# Patient Record
Sex: Female | Born: 1940 | Race: White | Hispanic: No | Smoking: Former smoker
Health system: Southern US, Community
[De-identification: ages and names within clinical notes are randomized; demographics above are authoritative.]

## PROBLEM LIST (undated history)

## (undated) DIAGNOSIS — F419 Anxiety disorder, unspecified: Secondary | ICD-10-CM

## (undated) DIAGNOSIS — C801 Malignant (primary) neoplasm, unspecified: Secondary | ICD-10-CM

## (undated) DIAGNOSIS — F329 Major depressive disorder, single episode, unspecified: Secondary | ICD-10-CM

## (undated) DIAGNOSIS — G4730 Sleep apnea, unspecified: Secondary | ICD-10-CM

## (undated) DIAGNOSIS — K5730 Diverticulosis of large intestine without perforation or abscess without bleeding: Secondary | ICD-10-CM

## (undated) DIAGNOSIS — K5790 Diverticulosis of intestine, part unspecified, without perforation or abscess without bleeding: Secondary | ICD-10-CM

## (undated) DIAGNOSIS — J45909 Unspecified asthma, uncomplicated: Secondary | ICD-10-CM

## (undated) DIAGNOSIS — J449 Chronic obstructive pulmonary disease, unspecified: Secondary | ICD-10-CM

## (undated) DIAGNOSIS — K219 Gastro-esophageal reflux disease without esophagitis: Secondary | ICD-10-CM

## (undated) DIAGNOSIS — F32A Depression, unspecified: Secondary | ICD-10-CM

## (undated) HISTORY — DX: Anxiety disorder, unspecified: F41.9

## (undated) HISTORY — DX: Gastro-esophageal reflux disease without esophagitis: K21.9

## (undated) HISTORY — PX: NASAL RECONSTRUCTION: SHX2069

## (undated) HISTORY — PX: KNEE SURGERY: SHX244

## (undated) HISTORY — PX: ABDOMINAL HYSTERECTOMY: SHX81

## (undated) HISTORY — DX: Major depressive disorder, single episode, unspecified: F32.9

## (undated) HISTORY — DX: Depression, unspecified: F32.A

## (undated) HISTORY — DX: Diverticulosis of large intestine without perforation or abscess without bleeding: K57.30

## (undated) HISTORY — PX: HIP SURGERY: SHX245

---

## 1998-11-04 ENCOUNTER — Ambulatory Visit (HOSPITAL_BASED_OUTPATIENT_CLINIC_OR_DEPARTMENT_OTHER): Admission: RE | Admit: 1998-11-04 | Discharge: 1998-11-04 | Payer: Self-pay | Admitting: Orthopedic Surgery

## 1999-03-18 ENCOUNTER — Encounter: Payer: Self-pay | Admitting: Orthopedic Surgery

## 1999-03-24 ENCOUNTER — Inpatient Hospital Stay (HOSPITAL_COMMUNITY): Admission: RE | Admit: 1999-03-24 | Discharge: 1999-03-29 | Payer: Self-pay | Admitting: Orthopedic Surgery

## 2004-11-18 ENCOUNTER — Ambulatory Visit: Payer: Self-pay | Admitting: Family Medicine

## 2004-11-25 ENCOUNTER — Emergency Department: Payer: Self-pay | Admitting: Emergency Medicine

## 2004-12-02 ENCOUNTER — Ambulatory Visit: Payer: Self-pay | Admitting: Psychiatry

## 2007-01-31 ENCOUNTER — Ambulatory Visit: Payer: Self-pay | Admitting: Cardiovascular Disease

## 2007-06-13 ENCOUNTER — Ambulatory Visit: Payer: Self-pay | Admitting: General Practice

## 2007-06-27 ENCOUNTER — Inpatient Hospital Stay: Payer: Self-pay | Admitting: General Practice

## 2007-07-20 ENCOUNTER — Encounter: Payer: Self-pay | Admitting: General Practice

## 2007-08-17 ENCOUNTER — Encounter: Payer: Self-pay | Admitting: General Practice

## 2007-10-06 ENCOUNTER — Ambulatory Visit: Payer: Self-pay | Admitting: Internal Medicine

## 2008-12-18 ENCOUNTER — Ambulatory Visit: Payer: Self-pay | Admitting: General Practice

## 2008-12-31 ENCOUNTER — Inpatient Hospital Stay: Payer: Self-pay | Admitting: General Practice

## 2009-02-25 ENCOUNTER — Encounter: Payer: Self-pay | Admitting: General Practice

## 2009-03-16 ENCOUNTER — Encounter: Payer: Self-pay | Admitting: General Practice

## 2010-03-06 ENCOUNTER — Ambulatory Visit: Payer: Self-pay | Admitting: Family Medicine

## 2011-01-13 ENCOUNTER — Ambulatory Visit: Payer: Self-pay | Admitting: Family Medicine

## 2011-03-10 ENCOUNTER — Ambulatory Visit: Payer: Self-pay | Admitting: Family Medicine

## 2012-01-29 ENCOUNTER — Ambulatory Visit: Payer: Self-pay | Admitting: Internal Medicine

## 2012-02-18 ENCOUNTER — Ambulatory Visit: Payer: Self-pay | Admitting: Family Medicine

## 2012-09-12 ENCOUNTER — Ambulatory Visit: Payer: Self-pay

## 2012-11-24 ENCOUNTER — Ambulatory Visit: Payer: Self-pay | Admitting: Family Medicine

## 2012-11-24 LAB — CBC WITH DIFFERENTIAL/PLATELET
Basophil #: 0.1 10*3/uL (ref 0.0–0.1)
Basophil %: 0.8 %
Eosinophil #: 0.2 10*3/uL (ref 0.0–0.7)
Eosinophil %: 1.4 %
HCT: 39.1 % (ref 35.0–47.0)
HGB: 12.7 g/dL (ref 12.0–16.0)
Lymphocyte #: 2.4 10*3/uL (ref 1.0–3.6)
Lymphocyte %: 19 %
MCH: 30 pg (ref 26.0–34.0)
MCHC: 32.5 g/dL (ref 32.0–36.0)
MCV: 93 fL (ref 80–100)
Monocyte #: 1 x10 3/mm — ABNORMAL HIGH (ref 0.2–0.9)
Monocyte %: 7.6 %
Neutrophil #: 9.2 10*3/uL — ABNORMAL HIGH (ref 1.4–6.5)
Neutrophil %: 71.2 %
Platelet: 292 10*3/uL (ref 150–440)
RBC: 4.22 10*6/uL (ref 3.80–5.20)
RDW: 13 % (ref 11.5–14.5)
WBC: 12.9 10*3/uL — ABNORMAL HIGH (ref 3.6–11.0)

## 2013-02-15 ENCOUNTER — Ambulatory Visit: Payer: Self-pay | Admitting: Family Medicine

## 2013-02-22 ENCOUNTER — Emergency Department: Payer: Self-pay | Admitting: Emergency Medicine

## 2013-02-22 ENCOUNTER — Ambulatory Visit: Payer: Self-pay | Admitting: Family Medicine

## 2013-02-22 LAB — BASIC METABOLIC PANEL
Anion Gap: 7 (ref 7–16)
BUN: 17 mg/dL (ref 7–18)
Calcium, Total: 8.6 mg/dL (ref 8.5–10.1)
Chloride: 104 mmol/L (ref 98–107)
Co2: 25 mmol/L (ref 21–32)
Creatinine: 0.73 mg/dL (ref 0.60–1.30)
EGFR (African American): >60
EGFR (Non-African Amer.): >60
Glucose: 124 mg/dL — ABNORMAL HIGH (ref 65–99)
Osmolality: 275 (ref 275–301)
Potassium: 4.4 mmol/L (ref 3.5–5.1)
Sodium: 136 mmol/L (ref 136–145)

## 2013-02-22 LAB — CBC
HCT: 36.4 % (ref 35.0–47.0)
HGB: 12.2 g/dL (ref 12.0–16.0)
MCH: 31.1 pg (ref 26.0–34.0)
MCHC: 33.4 g/dL (ref 32.0–36.0)
MCV: 93 fL (ref 80–100)
Platelet: 300 10*3/uL (ref 150–440)
RBC: 3.91 10*6/uL (ref 3.80–5.20)
RDW: 13.1 % (ref 11.5–14.5)
WBC: 9.2 10*3/uL (ref 3.6–11.0)

## 2013-02-22 LAB — TROPONIN I: Troponin-I: <0.02 ng/mL

## 2013-02-22 LAB — CK TOTAL AND CKMB (NOT AT ARMC)
CK, Total: 162 U/L (ref 21–215)
CK-MB: 2.2 ng/mL (ref 0.5–3.6)

## 2013-06-08 ENCOUNTER — Ambulatory Visit: Payer: Self-pay | Admitting: Gastroenterology

## 2013-06-08 HISTORY — PX: COLONOSCOPY: SHX174

## 2013-06-08 LAB — HM COLONOSCOPY

## 2013-10-09 ENCOUNTER — Emergency Department: Payer: Self-pay | Admitting: Emergency Medicine

## 2013-10-11 ENCOUNTER — Ambulatory Visit: Payer: Self-pay | Admitting: Family Medicine

## 2013-10-26 ENCOUNTER — Ambulatory Visit: Payer: Self-pay | Admitting: Internal Medicine

## 2013-12-31 ENCOUNTER — Emergency Department: Payer: Self-pay | Admitting: Emergency Medicine

## 2013-12-31 LAB — BASIC METABOLIC PANEL
Anion Gap: 4 — ABNORMAL LOW (ref 7–16)
BUN: 14 mg/dL (ref 7–18)
Calcium, Total: 8.5 mg/dL (ref 8.5–10.1)
Chloride: 105 mmol/L (ref 98–107)
Co2: 30 mmol/L (ref 21–32)
Creatinine: 0.75 mg/dL (ref 0.60–1.30)
EGFR (African American): >60
EGFR (Non-African Amer.): >60
Glucose: 99 mg/dL (ref 65–99)
Osmolality: 278 (ref 275–301)
Potassium: 3.7 mmol/L (ref 3.5–5.1)
Sodium: 139 mmol/L (ref 136–145)

## 2013-12-31 LAB — CBC
HCT: 36.2 % (ref 35.0–47.0)
HGB: 11.9 g/dL — ABNORMAL LOW (ref 12.0–16.0)
MCH: 30.7 pg (ref 26.0–34.0)
MCHC: 32.7 g/dL (ref 32.0–36.0)
MCV: 94 fL (ref 80–100)
Platelet: 295 10*3/uL (ref 150–440)
RBC: 3.87 10*6/uL (ref 3.80–5.20)
RDW: 14.5 % (ref 11.5–14.5)
WBC: 9.1 10*3/uL (ref 3.6–11.0)

## 2013-12-31 LAB — TROPONIN I: Troponin-I: <0.02 ng/mL

## 2014-02-28 ENCOUNTER — Ambulatory Visit: Payer: Self-pay | Admitting: Family Medicine

## 2014-03-26 DIAGNOSIS — M169 Osteoarthritis of hip, unspecified: Secondary | ICD-10-CM | POA: Insufficient documentation

## 2014-03-26 DIAGNOSIS — M1610 Unilateral primary osteoarthritis, unspecified hip: Secondary | ICD-10-CM | POA: Insufficient documentation

## 2014-03-26 DIAGNOSIS — M1990 Unspecified osteoarthritis, unspecified site: Secondary | ICD-10-CM | POA: Insufficient documentation

## 2014-03-27 ENCOUNTER — Encounter: Payer: Self-pay | Admitting: Otolaryngology

## 2014-03-29 DIAGNOSIS — K625 Hemorrhage of anus and rectum: Secondary | ICD-10-CM

## 2014-03-29 DIAGNOSIS — R1032 Left lower quadrant pain: Secondary | ICD-10-CM | POA: Insufficient documentation

## 2014-03-29 HISTORY — DX: Hemorrhage of anus and rectum: K62.5

## 2014-04-02 ENCOUNTER — Ambulatory Visit: Payer: Self-pay | Admitting: Gastroenterology

## 2014-04-16 ENCOUNTER — Encounter: Payer: Self-pay | Admitting: Otolaryngology

## 2014-05-16 ENCOUNTER — Encounter: Payer: Self-pay | Admitting: Otolaryngology

## 2014-06-16 ENCOUNTER — Encounter: Payer: Self-pay | Admitting: Otolaryngology

## 2014-07-17 ENCOUNTER — Encounter: Payer: Self-pay | Admitting: Otolaryngology

## 2014-08-16 ENCOUNTER — Encounter: Payer: Self-pay | Admitting: Otolaryngology

## 2014-09-16 ENCOUNTER — Encounter: Payer: Self-pay | Admitting: Otolaryngology

## 2014-11-19 ENCOUNTER — Ambulatory Visit: Payer: Self-pay | Admitting: Internal Medicine

## 2014-11-19 DIAGNOSIS — J9811 Atelectasis: Secondary | ICD-10-CM | POA: Diagnosis not present

## 2014-11-19 DIAGNOSIS — G4733 Obstructive sleep apnea (adult) (pediatric): Secondary | ICD-10-CM | POA: Diagnosis not present

## 2014-11-19 DIAGNOSIS — J449 Chronic obstructive pulmonary disease, unspecified: Secondary | ICD-10-CM | POA: Diagnosis not present

## 2014-11-19 DIAGNOSIS — Z23 Encounter for immunization: Secondary | ICD-10-CM | POA: Diagnosis not present

## 2014-11-19 DIAGNOSIS — R0602 Shortness of breath: Secondary | ICD-10-CM | POA: Diagnosis not present

## 2014-11-19 DIAGNOSIS — R05 Cough: Secondary | ICD-10-CM | POA: Diagnosis not present

## 2014-11-19 DIAGNOSIS — J209 Acute bronchitis, unspecified: Secondary | ICD-10-CM | POA: Diagnosis not present

## 2014-11-27 DIAGNOSIS — J449 Chronic obstructive pulmonary disease, unspecified: Secondary | ICD-10-CM | POA: Diagnosis not present

## 2014-12-27 DIAGNOSIS — J449 Chronic obstructive pulmonary disease, unspecified: Secondary | ICD-10-CM | POA: Diagnosis not present

## 2014-12-27 DIAGNOSIS — J986 Disorders of diaphragm: Secondary | ICD-10-CM | POA: Diagnosis not present

## 2014-12-27 DIAGNOSIS — G4733 Obstructive sleep apnea (adult) (pediatric): Secondary | ICD-10-CM | POA: Diagnosis not present

## 2014-12-28 DIAGNOSIS — J449 Chronic obstructive pulmonary disease, unspecified: Secondary | ICD-10-CM | POA: Diagnosis not present

## 2015-01-26 DIAGNOSIS — J449 Chronic obstructive pulmonary disease, unspecified: Secondary | ICD-10-CM | POA: Diagnosis not present

## 2015-02-04 DIAGNOSIS — G4733 Obstructive sleep apnea (adult) (pediatric): Secondary | ICD-10-CM | POA: Diagnosis not present

## 2015-02-20 DIAGNOSIS — G4733 Obstructive sleep apnea (adult) (pediatric): Secondary | ICD-10-CM | POA: Diagnosis not present

## 2015-02-22 DIAGNOSIS — R6889 Other general symptoms and signs: Secondary | ICD-10-CM | POA: Diagnosis not present

## 2015-02-22 DIAGNOSIS — J3089 Other allergic rhinitis: Secondary | ICD-10-CM | POA: Diagnosis not present

## 2015-02-22 DIAGNOSIS — K2971 Gastritis, unspecified, with bleeding: Secondary | ICD-10-CM | POA: Diagnosis not present

## 2015-02-22 DIAGNOSIS — Z9181 History of falling: Secondary | ICD-10-CM | POA: Diagnosis not present

## 2015-02-22 DIAGNOSIS — E784 Other hyperlipidemia: Secondary | ICD-10-CM | POA: Diagnosis not present

## 2015-02-22 DIAGNOSIS — Z0000 Encounter for general adult medical examination without abnormal findings: Secondary | ICD-10-CM | POA: Diagnosis not present

## 2015-02-22 DIAGNOSIS — J449 Chronic obstructive pulmonary disease, unspecified: Secondary | ICD-10-CM | POA: Diagnosis not present

## 2015-02-22 DIAGNOSIS — F339 Major depressive disorder, recurrent, unspecified: Secondary | ICD-10-CM | POA: Diagnosis not present

## 2015-02-22 LAB — FECAL OCCULT BLOOD, GUAIAC: Fecal Occult Blood: POSITIVE

## 2015-02-22 LAB — LIPID PANEL
Cholesterol: 147 mg/dL (ref 0–200)
HDL: 44 mg/dL (ref 35–70)
LDL CALC: 86 mg/dL
Triglycerides: 85 mg/dL (ref 40–160)

## 2015-02-22 LAB — CBC AND DIFFERENTIAL: Hemoglobin: 12.1 g/dL (ref 12.0–16.0)

## 2015-02-26 DIAGNOSIS — J449 Chronic obstructive pulmonary disease, unspecified: Secondary | ICD-10-CM | POA: Diagnosis not present

## 2015-03-01 ENCOUNTER — Other Ambulatory Visit: Payer: Self-pay | Admitting: Family Medicine

## 2015-03-01 DIAGNOSIS — Z1231 Encounter for screening mammogram for malignant neoplasm of breast: Secondary | ICD-10-CM

## 2015-03-01 DIAGNOSIS — Z780 Asymptomatic menopausal state: Secondary | ICD-10-CM

## 2015-03-13 DIAGNOSIS — K625 Hemorrhage of anus and rectum: Secondary | ICD-10-CM | POA: Diagnosis not present

## 2015-03-19 ENCOUNTER — Ambulatory Visit
Admission: RE | Admit: 2015-03-19 | Discharge: 2015-03-19 | Disposition: A | Discharge status: Home / Self Care | Payer: Commercial Managed Care - HMO | Source: Ambulatory Visit | Attending: Family Medicine | Admitting: Family Medicine

## 2015-03-19 DIAGNOSIS — Z780 Asymptomatic menopausal state: Secondary | ICD-10-CM

## 2015-03-19 DIAGNOSIS — Z1231 Encounter for screening mammogram for malignant neoplasm of breast: Secondary | ICD-10-CM | POA: Insufficient documentation

## 2015-03-19 DIAGNOSIS — M85861 Other specified disorders of bone density and structure, right lower leg: Secondary | ICD-10-CM | POA: Diagnosis not present

## 2015-03-19 DIAGNOSIS — M8588 Other specified disorders of bone density and structure, other site: Secondary | ICD-10-CM | POA: Diagnosis not present

## 2015-03-19 HISTORY — DX: Malignant (primary) neoplasm, unspecified: C80.1

## 2015-03-19 LAB — HM DEXA SCAN

## 2015-03-20 LAB — HM MAMMOGRAPHY: HM Mammogram: NORMAL

## 2015-03-28 DIAGNOSIS — J449 Chronic obstructive pulmonary disease, unspecified: Secondary | ICD-10-CM | POA: Diagnosis not present

## 2015-04-16 ENCOUNTER — Encounter: Payer: Self-pay | Admitting: Internal Medicine

## 2015-04-16 ENCOUNTER — Observation Stay
Admission: AD | Admit: 2015-04-16 | Discharge: 2015-04-19 | Disposition: A | Discharge status: Home / Self Care | Payer: Commercial Managed Care - HMO | Source: Ambulatory Visit | Attending: Internal Medicine | Admitting: Internal Medicine

## 2015-04-16 ENCOUNTER — Inpatient Hospital Stay: Payer: Commercial Managed Care - HMO

## 2015-04-16 DIAGNOSIS — J441 Chronic obstructive pulmonary disease with (acute) exacerbation: Principal | ICD-10-CM | POA: Insufficient documentation

## 2015-04-16 DIAGNOSIS — Z7951 Long term (current) use of inhaled steroids: Secondary | ICD-10-CM | POA: Diagnosis not present

## 2015-04-16 DIAGNOSIS — J45901 Unspecified asthma with (acute) exacerbation: Secondary | ICD-10-CM | POA: Insufficient documentation

## 2015-04-16 DIAGNOSIS — Z7982 Long term (current) use of aspirin: Secondary | ICD-10-CM | POA: Diagnosis not present

## 2015-04-16 DIAGNOSIS — G4730 Sleep apnea, unspecified: Secondary | ICD-10-CM | POA: Insufficient documentation

## 2015-04-16 DIAGNOSIS — I10 Essential (primary) hypertension: Secondary | ICD-10-CM | POA: Insufficient documentation

## 2015-04-16 DIAGNOSIS — R05 Cough: Secondary | ICD-10-CM | POA: Diagnosis not present

## 2015-04-16 DIAGNOSIS — J440 Chronic obstructive pulmonary disease with acute lower respiratory infection: Secondary | ICD-10-CM | POA: Diagnosis not present

## 2015-04-16 DIAGNOSIS — J45909 Unspecified asthma, uncomplicated: Secondary | ICD-10-CM | POA: Diagnosis present

## 2015-04-16 DIAGNOSIS — R0602 Shortness of breath: Secondary | ICD-10-CM | POA: Diagnosis not present

## 2015-04-16 DIAGNOSIS — E785 Hyperlipidemia, unspecified: Secondary | ICD-10-CM | POA: Insufficient documentation

## 2015-04-16 DIAGNOSIS — J209 Acute bronchitis, unspecified: Secondary | ICD-10-CM | POA: Diagnosis not present

## 2015-04-16 DIAGNOSIS — F329 Major depressive disorder, single episode, unspecified: Secondary | ICD-10-CM | POA: Insufficient documentation

## 2015-04-16 DIAGNOSIS — Z7952 Long term (current) use of systemic steroids: Secondary | ICD-10-CM | POA: Insufficient documentation

## 2015-04-16 DIAGNOSIS — R0789 Other chest pain: Secondary | ICD-10-CM | POA: Insufficient documentation

## 2015-04-16 DIAGNOSIS — E781 Pure hyperglyceridemia: Secondary | ICD-10-CM | POA: Diagnosis not present

## 2015-04-16 DIAGNOSIS — Z79899 Other long term (current) drug therapy: Secondary | ICD-10-CM | POA: Insufficient documentation

## 2015-04-16 DIAGNOSIS — R079 Chest pain, unspecified: Secondary | ICD-10-CM | POA: Insufficient documentation

## 2015-04-16 DIAGNOSIS — Z8541 Personal history of malignant neoplasm of cervix uteri: Secondary | ICD-10-CM | POA: Insufficient documentation

## 2015-04-16 DIAGNOSIS — G4733 Obstructive sleep apnea (adult) (pediatric): Secondary | ICD-10-CM | POA: Insufficient documentation

## 2015-04-16 DIAGNOSIS — K5730 Diverticulosis of large intestine without perforation or abscess without bleeding: Secondary | ICD-10-CM | POA: Insufficient documentation

## 2015-04-16 HISTORY — DX: Diverticulosis of intestine, part unspecified, without perforation or abscess without bleeding: K57.90

## 2015-04-16 HISTORY — DX: Unspecified asthma, uncomplicated: J45.909

## 2015-04-16 HISTORY — DX: Sleep apnea, unspecified: G47.30

## 2015-04-16 HISTORY — DX: Chronic obstructive pulmonary disease, unspecified: J44.9

## 2015-04-16 LAB — CREATININE, SERUM
Creatinine, Ser: 1.29 mg/dL — ABNORMAL HIGH (ref 0.44–1.00)
GFR calc Af Amer: 45 mL/min — ABNORMAL LOW (ref >60)
GFR calc non Af Amer: 39 mL/min — ABNORMAL LOW (ref >60)

## 2015-04-16 LAB — CBC
HCT: 39 % (ref 35.0–47.0)
HEMOGLOBIN: 12.7 g/dL (ref 12.0–16.0)
MCH: 29.9 pg (ref 26.0–34.0)
MCHC: 32.6 g/dL (ref 32.0–36.0)
MCV: 91.7 fL (ref 80.0–100.0)
PLATELETS: 268 10*3/uL (ref 150–440)
RBC: 4.25 MIL/uL (ref 3.80–5.20)
RDW: 13.6 % (ref 11.5–14.5)
WBC: 8.7 10*3/uL (ref 3.6–11.0)

## 2015-04-16 LAB — TROPONIN I: Troponin I: <0.03 ng/mL (ref <0.031)

## 2015-04-16 MED ORDER — FLUTICASONE PROPIONATE 50 MCG/ACT NA SUSP
2.0000 | Freq: Every day | NASAL | Status: DC
  Administered 2015-04-17 – 2015-04-19 (×3): 2 via NASAL
  Filled 2015-04-16: qty 16

## 2015-04-16 MED ORDER — CYANOCOBALAMIN 500 MCG PO TABS
1000.0000 ug | ORAL_TABLET | Freq: Every day | ORAL | Status: DC
  Administered 2015-04-17: 1000 ug via ORAL
  Administered 2015-04-18: 500 ug via ORAL
  Administered 2015-04-19: 1000 ug via ORAL
  Filled 2015-04-16 (×3): qty 2

## 2015-04-16 MED ORDER — CEFTRIAXONE SODIUM IN DEXTROSE 20 MG/ML IV SOLN
1.0000 g | INTRAVENOUS | Status: DC
  Administered 2015-04-16 – 2015-04-18 (×3): 1 g via INTRAVENOUS
  Filled 2015-04-16 (×4): qty 50

## 2015-04-16 MED ORDER — FLUOXETINE HCL 20 MG PO CAPS
20.0000 mg | ORAL_CAPSULE | Freq: Two times a day (BID) | ORAL | Status: DC
  Administered 2015-04-16 – 2015-04-19 (×5): 20 mg via ORAL
  Filled 2015-04-16 (×6): qty 1

## 2015-04-16 MED ORDER — GEMFIBROZIL 600 MG PO TABS
600.0000 mg | ORAL_TABLET | Freq: Two times a day (BID) | ORAL | Status: DC
Start: 2015-04-16 — End: 2015-04-19
  Administered 2015-04-16 – 2015-04-19 (×6): 600 mg via ORAL
  Filled 2015-04-16 (×7): qty 1

## 2015-04-16 MED ORDER — ALBUTEROL SULFATE (2.5 MG/3ML) 0.083% IN NEBU
2.5000 mg | INHALATION_SOLUTION | Freq: Four times a day (QID) | RESPIRATORY_TRACT | Status: DC
  Administered 2015-04-16 – 2015-04-18 (×6): 2.5 mg via RESPIRATORY_TRACT
  Filled 2015-04-16 (×7): qty 3

## 2015-04-16 MED ORDER — MULTIPLE VITAMINS PO TABS
1.0000 | ORAL_TABLET | Freq: Every day | ORAL | Status: DC
  Administered 2015-04-17: 1 via ORAL
  Filled 2015-04-16 (×4): qty 1

## 2015-04-16 MED ORDER — BUDESONIDE 0.5 MG/2ML IN SUSP
0.2500 mg | Freq: Two times a day (BID) | RESPIRATORY_TRACT | Status: DC
  Administered 2015-04-16: 20:00:00 via RESPIRATORY_TRACT
  Filled 2015-04-16 (×2): qty 2

## 2015-04-16 MED ORDER — AZITHROMYCIN 250 MG PO TABS
250.0000 mg | ORAL_TABLET | Freq: Every day | ORAL | Status: DC
  Administered 2015-04-17 – 2015-04-19 (×3): 250 mg via ORAL
  Filled 2015-04-16 (×3): qty 1

## 2015-04-16 MED ORDER — ACETAMINOPHEN 325 MG PO TABS
650.0000 mg | ORAL_TABLET | Freq: Three times a day (TID) | ORAL | Status: DC
  Administered 2015-04-16 – 2015-04-19 (×8): 650 mg via ORAL
  Filled 2015-04-16 (×17): qty 2

## 2015-04-16 MED ORDER — AZITHROMYCIN 250 MG PO TABS
500.0000 mg | ORAL_TABLET | Freq: Every day | ORAL | Status: AC
  Administered 2015-04-16: 500 mg via ORAL
  Filled 2015-04-16: qty 2

## 2015-04-16 MED ORDER — METHYLPREDNISOLONE SODIUM SUCC 125 MG IJ SOLR
60.0000 mg | Freq: Four times a day (QID) | INTRAMUSCULAR | Status: DC
  Administered 2015-04-16 – 2015-04-17 (×3): 60 mg via INTRAVENOUS
  Filled 2015-04-16 (×3): qty 2

## 2015-04-16 MED ORDER — CALCIUM CARBONATE-VITAMIN D 500-200 MG-UNIT PO TABS
1.0000 | ORAL_TABLET | Freq: Two times a day (BID) | ORAL | Status: DC
  Administered 2015-04-16 – 2015-04-19 (×6): 1 via ORAL
  Filled 2015-04-16 (×12): qty 1

## 2015-04-16 MED ORDER — ASPIRIN EC 81 MG PO TBEC
81.0000 mg | DELAYED_RELEASE_TABLET | Freq: Every day | ORAL | Status: DC
  Administered 2015-04-16 – 2015-04-19 (×4): 81 mg via ORAL
  Filled 2015-04-16 (×4): qty 1

## 2015-04-16 MED ORDER — ENOXAPARIN SODIUM 40 MG/0.4ML ~~LOC~~ SOLN
40.0000 mg | SUBCUTANEOUS | Status: DC
  Administered 2015-04-16 – 2015-04-18 (×3): 40 mg via SUBCUTANEOUS
  Filled 2015-04-16 (×3): qty 0.4

## 2015-04-16 NOTE — H&P (Signed)
[facility] [place] NAME: Regina Macdonald    MR#:  [id number]  DATE OF BIRTH:  February 13, 1941  DATE OF ADMISSION:  04/16/2015  PRIMARY CARE PHYSICIAN: Otilio Miu, MD   REQUESTING/REFERRING PHYSICIAN: Devona Konig  CHIEF COMPLAINT:  Shortness of breath and cough. Also chest pain.  HISTORY OF PRESENT ILLNESS:  Regina Macdonald  is a 74 y.o. female with a known history of asthmatic bronchitis and sleep apnea. She started developing cough and shortness of breath starting on Saturday. She's been in bed all weekend. She's been lightheaded, not eating very well, and not feeling very well. She's been coughing up yellow phlegm has a deep cough. She is also having rib pain. She has been using her nebulizer 3 times a day at home. She went to see Dr. Devona Konig in the office today. She was giving a nebulizer in the office today and then developed lower right-sided chest pain during the entire duration of the nebulizer and went away afterwards. After the nebulizer she was able to cough up a lot of phlegm. She was sent over for direct admission from Dr. Laurelyn Sickle office today.  PAST MEDICAL HISTORY:   Past Medical History  Diagnosis Date  . 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 COPD (chronic obstructive pulmonary disease)   . Sleep apnea   . Diverticulosis   . Asthma     PAST SURGICAL HISTORY:   Past Surgical History  Procedure Laterality Date  . Hip surgery Left   . Knee surgery Bilateral     SOCIAL HISTORY:   History  Substance Use Topics  . Smoking status: Never Smoker   . Smokeless tobacco: Not on file  . Alcohol Use: No    FAMILY HISTORY:   Family History  Problem Relation Age of Onset  . Cirrhosis Father   . Coronary artery disease Mother     DRUG ALLERGIES:   Allergies  Allergen Reactions  . Ciprofloxacin Other (See Comments)  . Etodolac Other (See Comments)  . Sulfa Antibiotics Rash    REVIEW OF SYSTEMS:  CONSTITUTIONAL: No  fever, positive for weakness, positive for weight loss.  EYES: No blurred or double vision. Wears glasses. EARS, NOSE, AND THROAT: No tinnitus or ear pain. No sore throat RESPIRATORY: Positive for cough and shortness of breath, some wheezing.  CARDIOVASCULAR: Positive for chest pain, no orthopnea or edema.  GASTROINTESTINAL: No nausea, vomiting or abdominal pain. Occasional blood in the bowel movements, has to take medication to avoid constipation. GENITOURINARY: No dysuria, hematuria.  ENDOCRINE: No polyuria, nocturia,  HEMATOLOGY: No anemia, easy bruising or bleeding SKIN: No rash or lesion. MUSCULOSKELETAL: History of knee pain.   NEUROLOGIC: No tingling, numbness, weakness.  PSYCHIATRY: No anxiety, depositive for pression.   MEDICATIONS AT HOME:   Prior to Admission medications   Medication Sig Start Date End Date Taking? Authorizing Provider  acetaminophen (TYLENOL) 650 MG CR tablet Take 1 tablet by mouth every 8 (eight) hours.   Yes Historical Provider, MD  albuterol (PROVENTIL HFA;VENTOLIN HFA) 108 (90 BASE) MCG/ACT inhaler Inhale 2 puffs into the lungs 4 (four) times daily.   Yes Historical Provider, MD  CALCIUM & MAGNESIUM CARBONATES PO Take 1 tablet by mouth daily.   Yes Historical Provider, MD  Calcium Carbonate-Vitamin D 600-400 MG-UNIT per tablet Take 1 tablet by mouth 2 (two) times daily.   Yes Historical Provider, MD  Cyanocobalamin (VITAMIN B-12) 1000 MCG SUBL 1 tablet daily. 08/31/14  Yes Historical Provider, MD  FLUoxetine (PROZAC) 20 MG capsule Take 1 capsule by mouth 2 (two) times daily.   Yes Historical Provider, MD  fluticasone (FLONASE) 50 MCG/ACT nasal spray Place 2 sprays into the nose daily.   Yes Historical Provider, MD  Fluticasone Furoate-Vilanterol 100-25 MCG/INH AEPB Inhale 1 spray into the lungs daily. Dr Humphrey Rolls   Yes Historical Provider, MD  gemfibrozil (LOPID) 600 MG tablet Take 1 tablet by mouth 2 (two) times daily.   Yes Historical Provider, MD  Multiple  Vitamins tablet Take 1 tablet by mouth daily.   Yes Historical Provider, MD  aspirin 81 MG chewable tablet Chew 1 tablet by mouth daily.    Historical Provider, MD      VITAL SIGNS:  Blood pressure 130/45, pulse 72, temperature 98.6 F (37 C), temperature source Oral, resp. rate 18, height 5\' 3"  (1.6 m), weight 89.223 kg (196 lb 11.2 oz), SpO2 96 %.  PHYSICAL EXAMINATION:  GENERAL:  74 y.o.-year-old patient lying in the bed with no acute distress.  EYES: Pupils equal, round, reactive to light and accommodation. No scleral icterus. Extraocular muscles intact.  HEENT: Head atraumatic, normocephalic. Oropharynx and nasopharynx clear.  NECK:  Supple, no jugular venous distention. No thyroid enlargement, no tenderness.  LUNGS:  decreased breath sounds bilaterally,  positive for wheezing throughout entire lung field. No  rales,rhonchi or crepitation. No use of accessory muscles of respiration.  CARDIOVASCULAR: S1, S2 normal. No murmurs, rubs, or gallops.  ABDOMEN: Soft, nontender, nondistended. Bowel sounds present. No organomegaly or mass.  EXTREMITIES: No pedal edema, cyanosis, or clubbing.  NEUROLOGIC: Cranial nerves II through XII are intact. Muscle strength 5/5 in all extremities. Sensation intact. Gait not checked.  PSYCHIATRIC: The patient is alert and oriented x 3.  SKIN: No rash, lesion, or ulcer.    IMPRESSION AND PLAN:   1. Either asthmatic bronchitis or COPD exacerbation. I will get a chest x-ray to rule out pneumonia. I will start Rocephin and Zithromax. I will start Solu-Medrol 60 mg IV every 6 hours. Continue nebulizer treatments and budesonide nebulizers. I will get a sputum culture. We'll consult Dr. Devona Konig since he sent the patient over. 2. Chest pain- likely this is related to the respiratory issues. Will monitor on telemetry and get serial cardiac enzymes. We'll continue aspirin at this point. 3. Sleep apnea- I will continue CPAP at night. 4. Depression on Prozac. 5.  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  Management plans discussed with the patient, family and they are in agreement.  CODE STATUS: full code  TOTAL TIME TAKING CARE OF THIS PATIENT: 55 minutes.    Loletha Grayer M.D on 04/16/2015 at 5:48 PM  Between 7am to 6pm - Pager - [phone]  After 6pm call admission pager [facility]  Office  [phone]  CC: Primary care physician; Otilio Miu, MD

## 2015-04-17 DIAGNOSIS — E781 Pure hyperglyceridemia: Secondary | ICD-10-CM | POA: Diagnosis not present

## 2015-04-17 DIAGNOSIS — J45901 Unspecified asthma with (acute) exacerbation: Secondary | ICD-10-CM | POA: Diagnosis not present

## 2015-04-17 DIAGNOSIS — E785 Hyperlipidemia, unspecified: Secondary | ICD-10-CM | POA: Diagnosis not present

## 2015-04-17 DIAGNOSIS — J209 Acute bronchitis, unspecified: Secondary | ICD-10-CM | POA: Diagnosis not present

## 2015-04-17 DIAGNOSIS — J440 Chronic obstructive pulmonary disease with acute lower respiratory infection: Secondary | ICD-10-CM | POA: Diagnosis not present

## 2015-04-17 DIAGNOSIS — F329 Major depressive disorder, single episode, unspecified: Secondary | ICD-10-CM | POA: Diagnosis not present

## 2015-04-17 DIAGNOSIS — J441 Chronic obstructive pulmonary disease with (acute) exacerbation: Secondary | ICD-10-CM | POA: Diagnosis not present

## 2015-04-17 DIAGNOSIS — G4730 Sleep apnea, unspecified: Secondary | ICD-10-CM | POA: Diagnosis not present

## 2015-04-17 DIAGNOSIS — Z8541 Personal history of malignant neoplasm of cervix uteri: Secondary | ICD-10-CM | POA: Diagnosis not present

## 2015-04-17 DIAGNOSIS — R079 Chest pain, unspecified: Secondary | ICD-10-CM | POA: Diagnosis not present

## 2015-04-17 LAB — BASIC METABOLIC PANEL
ANION GAP: 9 (ref 5–15)
BUN: 22 mg/dL — ABNORMAL HIGH (ref 6–20)
CALCIUM: 9.5 mg/dL (ref 8.9–10.3)
CHLORIDE: 103 mmol/L (ref 101–111)
CO2: 26 mmol/L (ref 22–32)
Creatinine, Ser: 0.9 mg/dL (ref 0.44–1.00)
GFR calc Af Amer: >60 mL/min (ref >60)
Glucose, Bld: 172 mg/dL — ABNORMAL HIGH (ref 65–99)
Potassium: 4.5 mmol/L (ref 3.5–5.1)
Sodium: 138 mmol/L (ref 135–145)

## 2015-04-17 LAB — CBC
HEMATOCRIT: 39 % (ref 35.0–47.0)
HEMOGLOBIN: 12.8 g/dL (ref 12.0–16.0)
MCH: 30.3 pg (ref 26.0–34.0)
MCHC: 32.8 g/dL (ref 32.0–36.0)
MCV: 92.6 fL (ref 80.0–100.0)
Platelets: 276 10*3/uL (ref 150–440)
RBC: 4.21 MIL/uL (ref 3.80–5.20)
RDW: 13.6 % (ref 11.5–14.5)
WBC: 6.8 10*3/uL (ref 3.6–11.0)

## 2015-04-17 LAB — TROPONIN I
Troponin I: <0.03 ng/mL (ref <0.031)
Troponin I: <0.03 ng/mL (ref <0.031)

## 2015-04-17 MED ORDER — BUDESONIDE 0.5 MG/2ML IN SUSP
0.5000 mg | Freq: Two times a day (BID) | RESPIRATORY_TRACT | Status: DC
  Administered 2015-04-17 – 2015-04-19 (×5): 0.5 mg via RESPIRATORY_TRACT
  Filled 2015-04-17 (×6): qty 2

## 2015-04-17 MED ORDER — HYDROCOD POLST-CPM POLST ER 10-8 MG/5ML PO SUER
5.0000 mL | Freq: Two times a day (BID) | ORAL | Status: DC
  Administered 2015-04-17 – 2015-04-19 (×5): 5 mL via ORAL
  Filled 2015-04-17 (×5): qty 5

## 2015-04-17 MED ORDER — METHYLPREDNISOLONE SODIUM SUCC 40 MG IJ SOLR
40.0000 mg | Freq: Four times a day (QID) | INTRAMUSCULAR | Status: DC
  Administered 2015-04-17 – 2015-04-19 (×7): 40 mg via INTRAVENOUS
  Filled 2015-04-17 (×7): qty 1

## 2015-04-17 MED FILL — Multiple Vitamin Tab: ORAL | Qty: 1 | Status: AC

## 2015-04-17 NOTE — Progress Notes (Signed)
Initial Nutrition Assessment  DOCUMENTATION CODES:     INTERVENTION:   (Meal and Snacks: Cater to patient preferences)  NUTRITION DIAGNOSIS:   (No nutrition concerns at this time)   GOAL:  Patient will meet greater than or equal to 90% of their needs    MONITOR:   (Energy Intake, Glucose Profile, Electrolyte and Renal Profile)  REASON FOR ASSESSMENT:  Malnutrition Screening Tool    ASSESSMENT:  Reason For Admission: asthmatic bronchitis PMHx:  Past Medical History  Diagnosis Date  . 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 COPD (chronic obstructive pulmonary disease)   . Sleep apnea   . Diverticulosis   . Asthma     Typical Fluid/ Food Intake: No intake of meals recorded per I/O; patient reports an increasing appetite as of today. Meal/ Snack Patterns: pt reports a decreased appetite x 2-3 PTA related to "feeling bad". Pt reports adhering to a regular diet with no restrictions PTA. Denies chewing/ swallowing difficulties.   Supplements: None  Labs:  Electrolyte and Renal Profile:  Recent Labs Lab 04/16/15 1810 04/17/15 0618  BUN  --  22*  CREATININE 1.29* 0.90  NA  --  138  K  --  4.5   Glu- 172  Meds: B-12, MVI  Physical Findings: n/a Weight Changes: Pt reports a UBW of 194-198#. Current weight represents no significant weight changes.  Height:  Ht Readings from Last 1 Encounters:  04/16/15 5\' 3"  (1.6 m)    Weight:  Wt Readings from Last 1 Encounters:  04/17/15 195 lb 12.8 oz (88.814 kg)    Ideal Body Weight:     Wt Readings from Last 10 Encounters:  04/17/15 195 lb 12.8 oz (88.814 kg)  02/22/15 203 lb (92.08 kg)    BMI:  Body mass index is 34.69 kg/(m^2).  Skin:  Reviewed, no issues  Diet Order:  Diet Heart Room service appropriate?: Yes; Fluid consistency:: Thin  EDUCATION NEEDS:  No education needs identified at this time   Intake/Output Summary (Last 24 hours) at 04/17/15 1347 Last data filed at 04/17/15 0800  Gross per 24 hour   Intake     50 ml  Output   1000 ml  Net   -950 ml    Last BM:  5/31  Roda Shutters, RDN Pager: [phone] Office: [place]

## 2015-04-17 NOTE — Plan of Care (Signed)
Problem: Phase II Progression Outcomes Goal: O2 sats > equal to 90% on RA or at baseline Patient was stable overnight. Patient continue to have expiratory/inspiratory wheezing and was on her home cpap overnight. IV antibiotics and Solumedrol were administered per order. Patient remained NSR on the monitor, hemodynamically stable  with VS WDL for patient. Will continue to monitor.

## 2015-04-17 NOTE — Progress Notes (Signed)
[facility] [place] NAME: Regina Macdonald    MR#:  [id number]  DATE OF BIRTH:  [birth year]  SUBJECTIVE:Admitted for asthma exacerbation and bronchitis.today she feels better.less cough,less wheezing.  CHIEF COMPLAINT:  No chief complaint on file.   REVIEW OF SYSTEMS:    Review of Systems  Constitutional: Negative for fever and chills.  HENT: Negative for hearing loss.   Eyes: Negative for blurred vision, double vision and photophobia.  Respiratory: Positive for wheezing. Negative for cough, hemoptysis and shortness of breath.   Cardiovascular: Negative for palpitations, orthopnea and leg swelling.  Gastrointestinal: Negative for vomiting, abdominal pain and diarrhea.  Genitourinary: Negative for dysuria and urgency.  Musculoskeletal: Negative for myalgias and neck pain.  Skin: Negative for rash.  Neurological: Negative for dizziness, focal weakness, seizures, weakness and headaches.  Psychiatric/Behavioral: Negative for memory loss. The patient does not have insomnia.     Nutrition: tolerating diet Tolerating Diet: Tolerating PT:      DRUG ALLERGIES:   Allergies  Allergen Reactions  . Ciprofloxacin Other (See Comments)  . Etodolac Other (See Comments)  . Sulfa Antibiotics Rash    VITALS:  Blood pressure 129/66, pulse 67, temperature 97.6 F (36.4 C), temperature source Oral, resp. rate 20, height 5\' 3"  (1.6 m), weight 88.814 kg (195 lb 12.8 oz), SpO2 97 %.  PHYSICAL EXAMINATION:   Physical Exam  GENERAL:  74 y.o.-year-old patient lying in the bed with no acute distress.  EYES: Pupils equal, round, reactive to light and accommodation. No scleral icterus. Extraocular muscles intact.  HEENT: Head atraumatic, normocephalic. Oropharynx and nasopharynx clear.  NECK:  Supple, no jugular venous distention. No thyroid enlargement, no tenderness.  LUNGS: mild expiratory wheezing,not  Using accessory muscles of respiration.   CARDIOVASCULAR: S1, S2 normal. No murmurs, rubs, or gallops.  ABDOMEN: Soft, nontender, nondistended. Bowel sounds present. No organomegaly or mass.  EXTREMITIES: No pedal edema, cyanosis, or clubbing.  NEUROLOGIC: Cranial nerves II through XII are intact. Muscle strength 5/5 in all extremities. Sensation intact. Gait not checked.  PSYCHIATRIC: The patient is alert and oriented x 3.  SKIN: No obvious rash, lesion, or ulcer.    LABORATORY PANEL:   CBC  Recent Labs Lab 04/17/15 0618  WBC 6.8  HGB 12.8  HCT 39.0  PLT 276   ------------------------------------------------------------------------------------------------------------------  Chemistries   Recent Labs Lab 04/17/15 0618  NA 138  K 4.5  CL 103  CO2 26  GLUCOSE 172*  BUN 22*  CREATININE 0.90  CALCIUM 9.5   ------------------------------------------------------------------------------------------------------------------  Cardiac Enzymes  Recent Labs Lab 04/17/15 0618  TROPONINI <0.03   ------------------------------------------------------------------------------------------------------------------  RADIOLOGY:  X-ray Chest Pa And Lateral  04/16/2015   CLINICAL DATA:  Chest pain, productive cough and shortness of breath for 3 days. Paralyzed right diaphragm.  EXAM: CHEST  2 VIEW  COMPARISON:  11/19/2014 and prior radiographs  FINDINGS: The cardiomediastinal silhouette is unremarkable.  Mild chronic peribronchial thickening and elevated right hemidiaphragm again noted.  There is no evidence of focal airspace disease, pulmonary edema, suspicious pulmonary nodule/mass, pleural effusion, or pneumothorax. No acute bony abnormalities are identified.  IMPRESSION: No active cardiopulmonary disease.   Electronically Signed   By: Margarette Canada M.D.   On: 04/16/2015 20:23     ASSESSMENT AND PLAN:   1.Acute Bronchitis;clinically improving,chest xray is negative for   Pneumonia;continue solumedrol,duonebs,empiric  abx,possible d./c home am. 2.sleep apnea;use cpap at night Depression;stable     All the records are reviewed  and case discussed with Care Management/Social Workerr. Management plans discussed with the patient, family and they are in agreement.  CODE STATUS:full  TOTAL TIME TAKING CARE OF THIS PATIENT: 35 min minutes.   POSSIBLE D/C IN 1-2 DAYS, DEPENDING ON CLINICAL CONDITION.   Epifanio Lesches M.D on 04/17/2015 at 10:27 AM  Between 7am to 6pm - Pager - [phone]  After 6pm go to [email or web address] - password [email or web address] [email or web address]  [facility]  Office  [phone]  CC: Primary care physician; Otilio Miu, MD

## 2015-04-18 DIAGNOSIS — R079 Chest pain, unspecified: Secondary | ICD-10-CM | POA: Diagnosis not present

## 2015-04-18 DIAGNOSIS — E785 Hyperlipidemia, unspecified: Secondary | ICD-10-CM | POA: Diagnosis not present

## 2015-04-18 DIAGNOSIS — J441 Chronic obstructive pulmonary disease with (acute) exacerbation: Secondary | ICD-10-CM | POA: Diagnosis not present

## 2015-04-18 DIAGNOSIS — F329 Major depressive disorder, single episode, unspecified: Secondary | ICD-10-CM | POA: Diagnosis not present

## 2015-04-18 DIAGNOSIS — E781 Pure hyperglyceridemia: Secondary | ICD-10-CM | POA: Diagnosis not present

## 2015-04-18 DIAGNOSIS — J209 Acute bronchitis, unspecified: Secondary | ICD-10-CM | POA: Diagnosis not present

## 2015-04-18 DIAGNOSIS — J440 Chronic obstructive pulmonary disease with acute lower respiratory infection: Secondary | ICD-10-CM | POA: Diagnosis not present

## 2015-04-18 DIAGNOSIS — Z8541 Personal history of malignant neoplasm of cervix uteri: Secondary | ICD-10-CM | POA: Diagnosis not present

## 2015-04-18 DIAGNOSIS — J45901 Unspecified asthma with (acute) exacerbation: Secondary | ICD-10-CM | POA: Diagnosis not present

## 2015-04-18 DIAGNOSIS — G4730 Sleep apnea, unspecified: Secondary | ICD-10-CM | POA: Diagnosis not present

## 2015-04-18 LAB — EXPECTORATED SPUTUM ASSESSMENT W REFEX TO RESP CULTURE

## 2015-04-18 LAB — EXPECTORATED SPUTUM ASSESSMENT W GRAM STAIN, RFLX TO RESP C

## 2015-04-18 MED ORDER — BECLOMETHASONE DIPROPIONATE 40 MCG/ACT IN AERS: 2.0000 | INHALATION_SPRAY | Freq: Two times a day (BID) | RESPIRATORY_TRACT | Status: DC

## 2015-04-18 MED ORDER — PREDNISONE 10 MG (21) PO TBPK: 10.0000 mg | ORAL_TABLET | Freq: Every day | ORAL | Status: DC

## 2015-04-18 MED ORDER — ADULT MULTIVITAMIN W/MINERALS CH
1.0000 | ORAL_TABLET | Freq: Every day | ORAL | Status: DC
  Administered 2015-04-18 – 2015-04-19 (×2): 1 via ORAL
  Filled 2015-04-18 (×2): qty 1

## 2015-04-18 MED ORDER — AZITHROMYCIN 250 MG PO TABS: ORAL_TABLET | ORAL | Status: DC

## 2015-04-18 MED ORDER — ALBUTEROL SULFATE (2.5 MG/3ML) 0.083% IN NEBU
2.5000 mg | INHALATION_SOLUTION | RESPIRATORY_TRACT | Status: DC | PRN
  Administered 2015-04-18 (×2): 2.5 mg via RESPIRATORY_TRACT
  Filled 2015-04-18 (×2): qty 3

## 2015-04-18 MED ORDER — AMOXICILLIN-POT CLAVULANATE 875-125 MG PO TABS: 1.0000 | ORAL_TABLET | Freq: Two times a day (BID) | ORAL | Status: DC

## 2015-04-18 MED ORDER — HYDROCOD POLST-CPM POLST ER 10-8 MG/5ML PO SUER: 5.0000 mL | Freq: Two times a day (BID) | ORAL | Status: DC

## 2015-04-18 NOTE — Progress Notes (Signed)
[facility] at [place] NAME: Regina Macdonald    MR#:  [id number]  DATE OF BIRTH:  October 25, 1941  SUBJECTIVE:Admitted for asthma exacerbation and bronchitis. She feels worse today with more wheezing, cough. And says that she feels more short of breath today.  CHIEF COMPLAINT:  No chief complaint on file.   REVIEW OF SYSTEMS:    Review of Systems  Constitutional: Negative for fever.  HENT: Negative for ear discharge and nosebleeds.   Eyes: Negative for blurred vision and double vision.  Respiratory: Positive for cough, shortness of breath and wheezing.   Cardiovascular: Negative for chest pain and palpitations.  Gastrointestinal: Negative for heartburn.  Musculoskeletal: Negative for myalgias and back pain.  Neurological: Negative for sensory change and speech change.  Endo/Heme/Allergies: Negative for polydipsia. Does not bruise/bleed easily.  Psychiatric/Behavioral: Negative for depression.    Nutrition: tolerating diet Tolerating Diet: Tolerating PT:      DRUG ALLERGIES:   Allergies  Allergen Reactions  . Ciprofloxacin Other (See Comments)  . Etodolac Other (See Comments)  . Sulfa Antibiotics Rash    VITALS:  Blood pressure 140/76, pulse 87, temperature 98.1 F (36.7 C), temperature source Oral, resp. rate 18, height 5\' 3"  (1.6 m), weight 88.315 kg (194 lb 11.2 oz), SpO2 99 %.  PHYSICAL EXAMINATION:   Physical Exam  GENERAL:  74 y.o.-year-old patient lying in the bed with no acute distress.  EYES: Pupils equal, round, reactive to light and accommodation. No scleral icterus. Extraocular muscles intact.  HEENT: Head atraumatic, normocephalic. Oropharynx and nasopharynx clear.  NECK:  Supple, no jugular venous distention. No thyroid enlargement, no tenderness.  LUNGS:  Bilateral expiratory wheezing more pronounced in all lung fields. CARDIOVASCULAR: S1, S2 normal. No murmurs, rubs, or gallops.  ABDOMEN: Soft, nontender,  nondistended. Bowel sounds present. No organomegaly or mass.  EXTREMITIES: No pedal edema, cyanosis, or clubbing.  NEUROLOGIC: Cranial nerves II through XII are intact. Muscle strength 5/5 in all extremities. Sensation intact. Gait not checked.  PSYCHIATRIC: The patient is alert and oriented x 3.  SKIN: No obvious rash, lesion, or ulcer.    LABORATORY PANEL:   CBC  Recent Labs Lab 04/17/15 0618  WBC 6.8  HGB 12.8  HCT 39.0  PLT 276   ------------------------------------------------------------------------------------------------------------------  Chemistries   Recent Labs Lab 04/17/15 0618  NA 138  K 4.5  CL 103  CO2 26  GLUCOSE 172*  BUN 22*  CREATININE 0.90  CALCIUM 9.5   ------------------------------------------------------------------------------------------------------------------  Cardiac Enzymes  Recent Labs Lab 04/17/15 0618  TROPONINI <0.03   ------------------------------------------------------------------------------------------------------------------  RADIOLOGY:  X-ray Chest Pa And Lateral  04/16/2015   CLINICAL DATA:  Chest pain, productive cough and shortness of breath for 3 days. Paralyzed right diaphragm.  EXAM: CHEST  2 VIEW  COMPARISON:  11/19/2014 and prior radiographs  FINDINGS: The cardiomediastinal silhouette is unremarkable.  Mild chronic peribronchial thickening and elevated right hemidiaphragm again noted.  There is no evidence of focal airspace disease, pulmonary edema, suspicious pulmonary nodule/mass, pleural effusion, or pneumothorax. No acute bony abnormalities are identified.  IMPRESSION: No active cardiopulmonary disease.   Electronically Signed   By: Margarette Canada M.D.   On: 04/16/2015 20:23     ASSESSMENT AND PLAN:   1.Acute Bronchitis. COPD exacerbation : continue Solu-Medrol, nebulizer every 4 hours, IV anti biotics. Needs close monitoring today also. Possible discharge tomorrow. 2.sleep apnea;use cpap at  night Depression;stable  #4 hyperlipidemia continue Lopid.     All the  records are reviewed and case discussed with Care Management/Social Workerr. Management plans discussed with the patient, family and they are in agreement.  CODE STATUS:full  TOTAL TIME TAKING CARE OF THIS PATIENT: 35 min minutes.   POSSIBLE D/C IN 1-2 DAYS, DEPENDING ON CLINICAL CONDITION.   Epifanio Lesches M.D on 04/18/2015 at 10:18 AM  Between 7am to 6pm - Pager - [phone]  After 6pm go to [email or web address] - password [email or web address] [facility]  [facility]  Office  [phone]  CC: Primary care physician; Otilio Miu, MD

## 2015-04-19 DIAGNOSIS — J440 Chronic obstructive pulmonary disease with acute lower respiratory infection: Secondary | ICD-10-CM | POA: Diagnosis not present

## 2015-04-19 DIAGNOSIS — J45901 Unspecified asthma with (acute) exacerbation: Secondary | ICD-10-CM | POA: Diagnosis not present

## 2015-04-19 DIAGNOSIS — Z8541 Personal history of malignant neoplasm of cervix uteri: Secondary | ICD-10-CM | POA: Diagnosis not present

## 2015-04-19 DIAGNOSIS — F329 Major depressive disorder, single episode, unspecified: Secondary | ICD-10-CM | POA: Diagnosis not present

## 2015-04-19 DIAGNOSIS — E781 Pure hyperglyceridemia: Secondary | ICD-10-CM | POA: Diagnosis not present

## 2015-04-19 DIAGNOSIS — R079 Chest pain, unspecified: Secondary | ICD-10-CM | POA: Diagnosis not present

## 2015-04-19 DIAGNOSIS — E785 Hyperlipidemia, unspecified: Secondary | ICD-10-CM | POA: Diagnosis not present

## 2015-04-19 DIAGNOSIS — J209 Acute bronchitis, unspecified: Secondary | ICD-10-CM | POA: Diagnosis not present

## 2015-04-19 DIAGNOSIS — J441 Chronic obstructive pulmonary disease with (acute) exacerbation: Secondary | ICD-10-CM | POA: Diagnosis not present

## 2015-04-19 DIAGNOSIS — G4730 Sleep apnea, unspecified: Secondary | ICD-10-CM | POA: Diagnosis not present

## 2015-04-19 MED ORDER — HYDRALAZINE HCL 25 MG PO TABS: 10.0000 mg | ORAL_TABLET | Freq: Three times a day (TID) | ORAL | Status: DC

## 2015-04-19 NOTE — Progress Notes (Signed)
Patient is discharge home in a stable condition, summary and f/u care given to both pt's and daughter verbalized understanding

## 2015-04-20 NOTE — Discharge Summary (Signed)
Regina Macdonald, is a 74 y.o. female  DOB 02/23/1941  MRN [id number].  Admission date:  04/16/2015  Admitting Physician  Hillary Bow, MD  Discharge Date:  04/19/2015   Primary MD  Otilio Miu, MD  Recommendations for primary care physician for things to follow:   Follow up with her lung doctor Dr. Raul Del.  Admission Diagnosis  chest pain copd   Discharge Diagnosis  chest pain copd    Active Problems:   Asthmatic bronchitis      Past Medical History  Diagnosis Date  . 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 COPD (chronic obstructive pulmonary disease)   . Sleep apnea   . Diverticulosis   . Asthma     Past Surgical History  Procedure Laterality Date  . Hip surgery Left   . Knee surgery Bilateral        History of present illness and  Hospital Course:     Kindly see H&P for history of present illness and admission details, please review complete Labs, Consult reports and Test reports for all details in brief  HPI  from the history and physical done on the day of admission  74 year old female patient with asthmatic bronchitis sleep apnea admitted for cough shortness of breath and COPD exacerbation. Patient admitted to telemetry started on nebulizers, still rides, and the biotics. Chest x-ray did not show any pneumonia.  Hospital Course   1. Regarding COPD exacerbation patient continued to have wheezing for 2 days continued on nebulizers, IV steroids . Patient condition improved. Discharge home with antibodies, tapering course of prednisone. Sleep apnea advised to continue CPAP at night Depression continue home medication Hyperlipidemia continue on Lopid.  Hypertension blood pressure stayed stable around 170/70 so we started on now hydralazine.  Discharge Condition: stable   Follow UP  Follow-up Information     Follow up with Otilio Miu, MD. Go on 04/24/2015.   Specialty:  Family Medicine   Why:  at 11:00am    Contact information:   [address] [place] [address] [phone]         Discharge Instructions  and  Discharge Medications        Medication List    TAKE these medications        acetaminophen 650 MG CR tablet  Commonly known as:  TYLENOL  Take 1 tablet by mouth every 8 (eight) hours.     albuterol 108 (90 BASE) MCG/ACT inhaler  Commonly known as:  PROVENTIL HFA;VENTOLIN HFA  Inhale 2 puffs into the lungs 4 (four) times daily.     amoxicillin-clavulanate 875-125 MG per tablet  Commonly known as:  AUGMENTIN  Take 1 tablet by mouth 2 (two) times daily.     aspirin 81 MG chewable tablet  Chew 1 tablet by mouth daily.     azithromycin 250 MG tablet  Commonly known as:  ZITHROMAX  Take for 3 days     beclomethasone 40 MCG/ACT inhaler  Commonly known as:  QVAR  Inhale 2 puffs into the lungs 2 (two) times daily.     CALCIUM & MAGNESIUM CARBONATES PO  Take 1 tablet by mouth daily.     Calcium Carbonate-Vitamin D 600-400 MG-UNIT per tablet  Take 1 tablet by mouth 2 (two) times daily.     chlorpheniramine-HYDROcodone 10-8 MG/5ML Suer  Commonly known as:  TUSSIONEX  Take 5 mLs by mouth 2 (two) times daily.     FLUoxetine 20 MG capsule  Commonly known as:  PROZAC  Take 1 capsule by mouth 2 (two) times daily.     fluticasone 50 MCG/ACT nasal spray  Commonly known as:  FLONASE  Place 2 sprays into the nose daily.     Fluticasone Furoate-Vilanterol 100-25 MCG/INH Aepb  Inhale 1 spray into the lungs daily. Dr Humphrey Rolls     gemfibrozil 600 MG tablet  Commonly known as:  LOPID  Take 1 tablet by mouth 2 (two) times daily.     hydrALAZINE 25 MG tablet  Commonly known as:  APRESOLINE  Take 0.5 tablets (12.5 mg total) by mouth 3 (three) times daily.     Multiple Vitamins tablet  Take 1 tablet by mouth daily.     predniSONE 10 MG (21) Tbpk tablet   Commonly known as:  STERAPRED UNI-PAK 21 TAB  Take 1 tablet (10 mg total) by mouth daily. Take as prescribed     Vitamin B-12 1000 MCG Subl  1 tablet daily.          Diet and Activity recommendation: See Discharge Instructions above   Consults obtained -none   Major procedures and Radiology Reports - PLEASE review detailed and final reports for all details, in brief -     X-ray Chest Pa And Lateral  04/16/2015   CLINICAL DATA:  Chest pain, productive cough and shortness of breath for 3 days. Paralyzed right diaphragm.  EXAM: CHEST  2 VIEW  COMPARISON:  11/19/2014 and prior radiographs  FINDINGS: The cardiomediastinal silhouette is unremarkable.  Mild chronic peribronchial thickening and elevated right hemidiaphragm again noted.  There is no evidence of focal airspace disease, pulmonary edema, suspicious pulmonary nodule/mass, pleural effusion, or pneumothorax. No acute bony abnormalities are identified.  IMPRESSION: No active cardiopulmonary disease.   Electronically Signed   By: Margarette Canada M.D.   On: 04/16/2015 20:23    Micro Results     Recent Results (from the past 240 hour(s))  Culture, expectorated sputum-assessment     Status: None   Collection Time: 04/17/15  2:01 PM  Result Value Ref Range Status   Specimen Description SPUTUM  Final   Special Requests NONE  Final   Sputum evaluation THIS SPECIMEN IS ACCEPTABLE FOR SPUTUM CULTURE  Final   Report Status 04/18/2015 FINAL  Final  Culture, respiratory (NON-Expectorated)     Status: None (Preliminary result)   Collection Time: 04/17/15  2:01 PM  Result Value Ref Range Status   Specimen Description SPUTUM  Final   Special Requests NONE Reflexed from [id number]  Final   Gram Stain   Final    GOOD SPECIMEN - 80-90% WBCS FEW WBC SEEN FEW GRAM POSITIVE COCCI RARE GRAM NEGATIVE RODS    Culture   Final    RARE GROWTH STAPHYLOCOCCUS AUREUS SUSCEPTIBILITIES TO FOLLOW ISOLATING SECOND POSSIBLE PATHOGEN    Report Status  PENDING  Incomplete       Today   Subjective:   Regina Macdonald today has no headache,no chest abdominal pain,no new weakness tingling or numbness, feels much better wants to go home today.   Objective:   Blood pressure 164/69, pulse 71, temperature 98.3 F (36.8 C), temperature source Oral, resp. rate 20, height 5\' 3"  (1.6 m), weight 87.272 kg (192 lb 6.4 oz), SpO2 97 %.  No intake or output data in the 24 hours ending 04/20/15 0843  Exam Awake Alert, Oriented x 3, No new F.N deficits, Normal affect [place].AT,PERRAL Supple Neck,No JVD, No cervical lymphadenopathy appriciated.  Symmetrical Chest wall movement, Good air movement bilaterally, CTAB RRR,No  Gallops,Rubs or new Murmurs, No Parasternal Heave +ve B.Sounds, Abd Soft, Non tender, No organomegaly appriciated, No rebound -guarding or rigidity. No Cyanosis, Clubbing or edema, No new Rash or bruise  Data Review   CBC w Diff:  Lab Results  Component Value Date   WBC 6.8 04/17/2015   WBC 9.1 12/31/2013   HGB 12.8 04/17/2015   HGB 11.9* 12/31/2013   HCT 39.0 04/17/2015   HCT 36.2 12/31/2013   PLT 276 04/17/2015   PLT 295 12/31/2013   LYMPHOPCT 19.0 11/24/2012   MONOPCT 7.6 11/24/2012   EOSPCT 1.4 11/24/2012   BASOPCT 0.8 11/24/2012    CMP:  Lab Results  Component Value Date   NA 138 04/17/2015   NA 139 12/31/2013   K 4.5 04/17/2015   K 3.7 12/31/2013   CL 103 04/17/2015   CL 105 12/31/2013   CO2 26 04/17/2015   CO2 30 12/31/2013   BUN 22* 04/17/2015   BUN 14 12/31/2013   CREATININE 0.90 04/17/2015   CREATININE 0.75 12/31/2013  .   Total Time in preparing paper work, data evaluation and todays exam - 23 minutes  Micheal Sheen M.D on 04/19/2015 at 8:43 AM

## 2015-04-21 LAB — CULTURE, RESPIRATORY W GRAM STAIN

## 2015-04-21 LAB — CULTURE, RESPIRATORY

## 2015-04-22 DIAGNOSIS — G4733 Obstructive sleep apnea (adult) (pediatric): Secondary | ICD-10-CM | POA: Diagnosis not present

## 2015-04-22 DIAGNOSIS — R0602 Shortness of breath: Secondary | ICD-10-CM | POA: Diagnosis not present

## 2015-04-22 DIAGNOSIS — J449 Chronic obstructive pulmonary disease, unspecified: Secondary | ICD-10-CM | POA: Diagnosis not present

## 2015-04-24 ENCOUNTER — Encounter (INDEPENDENT_AMBULATORY_CARE_PROVIDER_SITE_OTHER): Payer: Self-pay

## 2015-04-24 ENCOUNTER — Encounter: Payer: Self-pay | Admitting: Family Medicine

## 2015-04-24 ENCOUNTER — Other Ambulatory Visit: Payer: Self-pay

## 2015-04-24 ENCOUNTER — Ambulatory Visit (INDEPENDENT_AMBULATORY_CARE_PROVIDER_SITE_OTHER): Payer: Commercial Managed Care - HMO | Admitting: Family Medicine

## 2015-04-24 VITALS — BP 138/60 | HR 86 | Ht 63.0 in | Wt 197.0 lb

## 2015-04-24 DIAGNOSIS — Z9289 Personal history of other medical treatment: Secondary | ICD-10-CM

## 2015-04-24 DIAGNOSIS — R0789 Other chest pain: Secondary | ICD-10-CM | POA: Diagnosis not present

## 2015-04-24 DIAGNOSIS — I10 Essential (primary) hypertension: Secondary | ICD-10-CM

## 2015-04-24 NOTE — Progress Notes (Signed)
Name: Regina Macdonald   MRN: [id number]    DOB: 05-13-1941   Date:04/24/2015       Progress Note  Subjective  Chief Complaint  Chief Complaint  Patient presents with  . COPD    had a COPD flare- Dr Humphrey Rolls sent to hospital    Chest Pain  This is a new problem. The current episode started 1 to 4 weeks ago. The onset quality is gradual. The problem occurs intermittently. The problem has been unchanged. The pain is present in the epigastric region. The pain is mild. The quality of the pain is described as tightness and sharp. Pertinent negatives include no abdominal pain, back pain, cough, diaphoresis, dizziness, fever, headaches, hemoptysis, lower extremity edema, malaise/fatigue, nausea, near-syncope, orthopnea, palpitations, PND, shortness of breath, sputum production or syncope. The pain is aggravated by breathing (during nebulization). She has tried nothing for the symptoms. Risk factors include obesity and sedentary lifestyle.  Her past medical history is significant for COPD, hyperlipidemia and hypertension.  Pertinent negatives for past medical history include no CAD, no CHF, no DVT, no MI and no PE.    No problem-specific assessment & plan notes found for this encounter.   Past Medical History  Diagnosis Date  . 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 COPD (chronic obstructive pulmonary disease)   . Sleep apnea   . Diverticulosis   . Asthma   . Diverticula, colon     Past Surgical History  Procedure Laterality Date  . Hip surgery Left   . Knee surgery Bilateral   . Nasal reconstruction      x 2    Family History  Problem Relation Age of Onset  . Cirrhosis Father   . Coronary artery disease Mother     History   Social History  . Marital Status: Divorced    Spouse Name: N/A  . Number of Children: N/A  . Years of Education: N/A   Occupational History  . Not on file.   Social History Main Topics  . Smoking status: Never Smoker   . Smokeless tobacco: Not on file  . Alcohol Use:  No  . Drug Use: No  . Sexual Activity: No   Other Topics Concern  . Not on file   Social History Narrative    Allergies  Allergen Reactions  . Ciprofloxacin Other (See Comments)  . Etodolac Other (See Comments)  . Sulfa Antibiotics Rash     Review of Systems  Constitutional: Negative for fever, chills, weight loss, malaise/fatigue and diaphoresis.  HENT: Negative for ear discharge, ear pain and sore throat.   Eyes: Negative for blurred vision.  Respiratory: Negative for cough, hemoptysis, sputum production, shortness of breath and wheezing.   Cardiovascular: Positive for chest pain. Negative for palpitations, orthopnea, leg swelling, syncope, PND and near-syncope.  Gastrointestinal: Negative for heartburn, nausea, abdominal pain, diarrhea, constipation, blood in stool and melena.  Genitourinary: Negative for dysuria, urgency, frequency and hematuria.  Musculoskeletal: Negative for myalgias, back pain, joint pain and neck pain.  Skin: Negative for rash.  Neurological: Negative for dizziness, tingling, sensory change, focal weakness and headaches.  Endo/Heme/Allergies: Negative for environmental allergies and polydipsia. Does not bruise/bleed easily.  Psychiatric/Behavioral: Negative for depression and suicidal ideas. The patient is not nervous/anxious and does not have insomnia.      Objective  Filed Vitals:   04/24/15 1119  BP: 138/60  Pulse: 86  Height: '5\' 3"'  (1.6 m)  Weight: 197 lb (89.359 kg)    Physical Exam  Constitutional:  She is well-developed, well-nourished, and in no distress.  HENT:  Head: Normocephalic and atraumatic.  Right Ear: External ear normal.  Left Ear: External ear normal.  Nose: Nose normal.  Mouth/Throat: Oropharynx is clear and moist.  Eyes: Conjunctivae and EOM are normal. Pupils are equal, round, and reactive to light.  Neck: Normal range of motion.  Cardiovascular: Normal rate, regular rhythm, normal heart sounds and intact distal  pulses.  Exam reveals no gallop.   No murmur heard. Pulmonary/Chest: Effort normal and breath sounds normal.  Abdominal: Soft. Bowel sounds are normal.  Skin: Skin is warm and dry.  Psychiatric: Mood and affect normal.      Recent Results (from the past 2160 hour(s))  CBC and differential     Status: None   Collection Time: 02/22/15 12:00 AM  Result Value Ref Range   Hemoglobin 12.1 12.0 - 16.0 g/dL  Fecal Occult Blood, Guaiac     Status: None   Collection Time: 02/22/15 12:00 AM  Result Value Ref Range   Fecal Occult Blood Positive   Lipid panel     Status: None   Collection Time: 02/22/15 12:00 AM  Result Value Ref Range   Triglycerides 85 40 - 160 mg/dL   Cholesterol 147 0 - 200 mg/dL   HDL 44 35 - 70 mg/dL   LDL Cholesterol 86 mg/dL  HM DEXA SCAN     Status: None   Collection Time: 03/19/15 12:00 AM  Result Value Ref Range   HM Dexa Scan osteoporosis   HM MAMMOGRAPHY     Status: None   Collection Time: 03/20/15 12:00 AM  Result Value Ref Range   HM Mammogram normal   CBC     Status: None   Collection Time: 04/16/15  6:10 PM  Result Value Ref Range   WBC 8.7 3.6 - 11.0 K/uL   RBC 4.25 3.80 - 5.20 MIL/uL   Hemoglobin 12.7 12.0 - 16.0 g/dL   HCT 39.0 35.0 - 47.0 %   MCV 91.7 80.0 - 100.0 fL   MCH 29.9 26.0 - 34.0 pg   MCHC 32.6 32.0 - 36.0 g/dL   RDW 13.6 11.5 - 14.5 %   Platelets 268 150 - 440 K/uL  Creatinine, serum     Status: Abnormal   Collection Time: 04/16/15  6:10 PM  Result Value Ref Range   Creatinine, Ser 1.29 (H) 0.44 - 1.00 mg/dL   GFR calc non Af Amer 39 (L) >60 mL/min   GFR calc Af Amer 45 (L) >60 mL/min    Comment: (NOTE) The eGFR has been calculated using the CKD EPI equation. This calculation has not been validated in all clinical situations. eGFR's persistently <60 mL/min signify possible Chronic Kidney Disease.   Troponin I     Status: None   Collection Time: 04/16/15  6:10 PM  Result Value Ref Range   Troponin I <0.03 <0.031 ng/mL     Comment:        NO INDICATION OF MYOCARDIAL INJURY.   Troponin I     Status: None   Collection Time: 04/17/15 12:18 AM  Result Value Ref Range   Troponin I <0.03 <0.031 ng/mL    Comment:        NO INDICATION OF MYOCARDIAL INJURY.   Basic metabolic panel     Status: Abnormal   Collection Time: 04/17/15  6:18 AM  Result Value Ref Range   Sodium 138 135 - 145 mmol/L   Potassium 4.5 3.5 - 5.1  mmol/L   Chloride 103 101 - 111 mmol/L   CO2 26 22 - 32 mmol/L   Glucose, Bld 172 (H) 65 - 99 mg/dL   BUN 22 (H) 6 - 20 mg/dL   Creatinine, Ser 0.90 0.44 - 1.00 mg/dL   Calcium 9.5 8.9 - 10.3 mg/dL   GFR calc non Af Amer >60 >60 mL/min   GFR calc Af Amer >60 >60 mL/min    Comment: (NOTE) The eGFR has been calculated using the CKD EPI equation. This calculation has not been validated in all clinical situations. eGFR's persistently <60 mL/min signify possible Chronic Kidney Disease.    Anion gap 9 5 - 15  CBC     Status: None   Collection Time: 04/17/15  6:18 AM  Result Value Ref Range   WBC 6.8 3.6 - 11.0 K/uL   RBC 4.21 3.80 - 5.20 MIL/uL   Hemoglobin 12.8 12.0 - 16.0 g/dL   HCT 39.0 35.0 - 47.0 %   MCV 92.6 80.0 - 100.0 fL   MCH 30.3 26.0 - 34.0 pg   MCHC 32.8 32.0 - 36.0 g/dL   RDW 13.6 11.5 - 14.5 %   Platelets 276 150 - 440 K/uL  Troponin I     Status: None   Collection Time: 04/17/15  6:18 AM  Result Value Ref Range   Troponin I <0.03 <0.031 ng/mL    Comment:        NO INDICATION OF MYOCARDIAL INJURY.   Culture, expectorated sputum-assessment     Status: None   Collection Time: 04/17/15  2:01 PM  Result Value Ref Range   Specimen Description SPUTUM    Special Requests NONE    Sputum evaluation THIS SPECIMEN IS ACCEPTABLE FOR SPUTUM CULTURE    Report Status 04/18/2015 FINAL   Culture, respiratory (NON-Expectorated)     Status: None   Collection Time: 04/17/15  2:01 PM  Result Value Ref Range   Specimen Description SPUTUM    Special Requests NONE Reflexed from [id number]     Gram Stain      GOOD SPECIMEN - 80-90% WBCS FEW WBC SEEN FEW GRAM POSITIVE COCCI RARE GRAM NEGATIVE RODS    Culture RARE GROWTH STAPHYLOCOCCUS AUREUS    Report Status 04/21/2015 FINAL    Organism ID, Bacteria STAPHYLOCOCCUS AUREUS       Susceptibility   Staphylococcus aureus - MIC*    CIPROFLOXACIN <=0.5 SENSITIVE Sensitive     ERYTHROMYCIN >=8 RESISTANT Resistant     GENTAMICIN <=0.5 SENSITIVE Sensitive     OXACILLIN <=0.25 SENSITIVE Sensitive     TETRACYCLINE <=1 SENSITIVE Sensitive     TRIMETH/SULFA <=10 SENSITIVE Sensitive     CLINDAMYCIN <=0.25 SENSITIVE Sensitive     CEFOXITIN SCREEN NEGATIVE Sensitive     Inducible Clindamycin NEGATIVE Sensitive     * RARE GROWTH STAPHYLOCOCCUS AUREUS     Assessment & Plan  Problem List Items Addressed This Visit    None    Visit Diagnoses    Hospitalization within last 30 days    -  Primary    Atypical chest pain        Relevant Orders    Ambulatory referral to Cardiology    Essential hypertension        cont hydralzine         Dr. Sung [facility]  04/24/2015

## 2015-04-24 NOTE — Patient Instructions (Signed)
Patient cont hydralazine until eval

## 2015-04-28 DIAGNOSIS — J449 Chronic obstructive pulmonary disease, unspecified: Secondary | ICD-10-CM | POA: Diagnosis not present

## 2015-05-03 DIAGNOSIS — E782 Mixed hyperlipidemia: Secondary | ICD-10-CM | POA: Diagnosis not present

## 2015-05-03 DIAGNOSIS — G4730 Sleep apnea, unspecified: Secondary | ICD-10-CM | POA: Diagnosis not present

## 2015-05-03 DIAGNOSIS — R071 Chest pain on breathing: Secondary | ICD-10-CM | POA: Diagnosis not present

## 2015-05-03 DIAGNOSIS — R0602 Shortness of breath: Secondary | ICD-10-CM | POA: Diagnosis not present

## 2015-05-03 DIAGNOSIS — I10 Essential (primary) hypertension: Secondary | ICD-10-CM | POA: Diagnosis not present

## 2015-05-03 DIAGNOSIS — K219 Gastro-esophageal reflux disease without esophagitis: Secondary | ICD-10-CM | POA: Diagnosis not present

## 2015-05-07 DIAGNOSIS — G4733 Obstructive sleep apnea (adult) (pediatric): Secondary | ICD-10-CM | POA: Diagnosis not present

## 2015-05-08 DIAGNOSIS — R079 Chest pain, unspecified: Secondary | ICD-10-CM | POA: Diagnosis not present

## 2015-05-09 DIAGNOSIS — I10 Essential (primary) hypertension: Secondary | ICD-10-CM | POA: Diagnosis not present

## 2015-05-09 DIAGNOSIS — G4733 Obstructive sleep apnea (adult) (pediatric): Secondary | ICD-10-CM | POA: Diagnosis not present

## 2015-05-09 DIAGNOSIS — K219 Gastro-esophageal reflux disease without esophagitis: Secondary | ICD-10-CM | POA: Diagnosis not present

## 2015-05-09 DIAGNOSIS — R071 Chest pain on breathing: Secondary | ICD-10-CM | POA: Diagnosis not present

## 2015-05-09 DIAGNOSIS — E785 Hyperlipidemia, unspecified: Secondary | ICD-10-CM | POA: Diagnosis not present

## 2015-05-31 ENCOUNTER — Ambulatory Visit (INDEPENDENT_AMBULATORY_CARE_PROVIDER_SITE_OTHER): Payer: Commercial Managed Care - HMO | Admitting: Family Medicine

## 2015-05-31 ENCOUNTER — Encounter: Payer: Self-pay | Admitting: Family Medicine

## 2015-05-31 VITALS — BP 120/50 | HR 64 | Ht 63.0 in | Wt 200.0 lb

## 2015-05-31 DIAGNOSIS — I10 Essential (primary) hypertension: Secondary | ICD-10-CM | POA: Diagnosis not present

## 2015-05-31 MED ORDER — HYDRALAZINE HCL 10 MG PO TABS: 10.0000 mg | ORAL_TABLET | Freq: Three times a day (TID) | ORAL | Status: DC

## 2015-05-31 NOTE — Progress Notes (Signed)
Name: Regina Macdonald   MRN: [id number]    DOB: 1941/10/26   Date:05/31/2015       Progress Note  Subjective  Chief Complaint  Chief Complaint  Patient presents with  . Hypertension    follow up on starting med    Hypertension This is a recurrent problem. The current episode started more than 1 year ago. The problem has been waxing and waning since onset. The problem is controlled. Pertinent negatives include no anxiety, blurred vision, chest pain, headaches, malaise/fatigue, neck pain, orthopnea, palpitations, peripheral edema, PND, shortness of breath or sweats. There are no associated agents to hypertension. Risk factors for coronary artery disease include stress and dyslipidemia. Past treatments include direct vasodilators. The current treatment provides mild improvement. There are no compliance problems.  There is no history of angina, kidney disease, CAD/MI, CVA, heart failure, left ventricular hypertrophy, PVD or retinopathy. There is no history of chronic renal disease.    No problem-specific assessment & plan notes found for this encounter.   Past Medical History  Diagnosis Date  . 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 COPD (chronic obstructive pulmonary disease)   . Sleep apnea   . Diverticulosis   . Asthma   . Diverticula, colon     Past Surgical History  Procedure Laterality Date  . Hip surgery Left   . Knee surgery Bilateral   . Nasal reconstruction      x 2    Family History  Problem Relation Age of Onset  . Cirrhosis Father   . Coronary artery disease Mother     History   Social History  . Marital Status: Divorced    Spouse Name: N/A  . Number of Children: N/A  . Years of Education: N/A   Occupational History  . Not on file.   Social History Main Topics  . Smoking status: Never Smoker   . Smokeless tobacco: Not on file  . Alcohol Use: No  . Drug Use: No  . Sexual Activity: No   Other Topics Concern  . Not on file   Social History Narrative     Allergies  Allergen Reactions  . Ciprofloxacin Other (See Comments)  . Etodolac Other (See Comments)  . Sulfa Antibiotics Rash     Review of Systems  Constitutional: Negative for fever, chills, weight loss and malaise/fatigue.  HENT: Negative for ear discharge, ear pain and sore throat.   Eyes: Negative for blurred vision.  Respiratory: Negative for cough, sputum production, shortness of breath and wheezing.   Cardiovascular: Negative for chest pain, palpitations, orthopnea, leg swelling and PND.  Gastrointestinal: Negative for heartburn, nausea, abdominal pain, diarrhea, constipation, blood in stool and melena.  Genitourinary: Negative for dysuria, urgency, frequency and hematuria.  Musculoskeletal: Negative for myalgias, back pain, joint pain and neck pain.  Skin: Negative for rash.  Neurological: Negative for dizziness, tingling, sensory change, focal weakness and headaches.  Endo/Heme/Allergies: Negative for environmental allergies and polydipsia. Does not bruise/bleed easily.  Psychiatric/Behavioral: Negative for depression and suicidal ideas. The patient is not nervous/anxious and does not have insomnia.      Objective  Filed Vitals:   05/31/15 1034  BP: 120/50  Pulse: 64  Height: 5\' 3"  (1.6 m)  Weight: 200 lb (90.719 kg)    Physical Exam  Constitutional: She is well-developed, well-nourished, and in no distress. No distress.  HENT:  Head: Normocephalic and atraumatic.  Right Ear: External ear normal.  Left Ear: External ear normal.  Nose: Nose normal.  Mouth/Throat: Oropharynx  is clear and moist.  Eyes: Conjunctivae and EOM are normal. Pupils are equal, round, and reactive to light. Right eye exhibits no discharge. Left eye exhibits no discharge.  Neck: Normal range of motion. Neck supple. No JVD present. No thyromegaly present.  Cardiovascular: Normal rate, regular rhythm, normal heart sounds and intact distal pulses.  Exam reveals no gallop and no friction  rub.   No murmur heard. Pulmonary/Chest: Effort normal and breath sounds normal.  Abdominal: Soft. Bowel sounds are normal. She exhibits no mass. There is no tenderness. There is no guarding.  Musculoskeletal: Normal range of motion. She exhibits no edema.  Lymphadenopathy:    She has no cervical adenopathy.  Neurological: She is alert. She has normal reflexes.  Skin: Skin is warm and dry. She is not diaphoretic.  Psychiatric: Mood and affect normal.      Assessment & Plan  Problem List Items Addressed This Visit    None    Visit Diagnoses    Essential hypertension    -  Primary    Relevant Medications    hydrALAZINE (APRESOLINE) 10 MG tablet         Dr. Lajuane Leatham [facility]  05/31/2015

## 2015-06-10 ENCOUNTER — Other Ambulatory Visit: Payer: Self-pay

## 2015-06-14 DIAGNOSIS — R9430 Abnormal result of cardiovascular function study, unspecified: Secondary | ICD-10-CM | POA: Diagnosis not present

## 2015-06-14 DIAGNOSIS — R079 Chest pain, unspecified: Secondary | ICD-10-CM | POA: Diagnosis not present

## 2015-06-18 DIAGNOSIS — E785 Hyperlipidemia, unspecified: Secondary | ICD-10-CM | POA: Diagnosis not present

## 2015-06-18 DIAGNOSIS — G4733 Obstructive sleep apnea (adult) (pediatric): Secondary | ICD-10-CM | POA: Diagnosis not present

## 2015-06-18 DIAGNOSIS — I10 Essential (primary) hypertension: Secondary | ICD-10-CM | POA: Diagnosis not present

## 2015-06-18 DIAGNOSIS — R0602 Shortness of breath: Secondary | ICD-10-CM | POA: Diagnosis not present

## 2015-06-18 DIAGNOSIS — K219 Gastro-esophageal reflux disease without esophagitis: Secondary | ICD-10-CM | POA: Diagnosis not present

## 2015-06-21 ENCOUNTER — Other Ambulatory Visit: Payer: Self-pay

## 2015-06-21 DIAGNOSIS — M25531 Pain in right wrist: Secondary | ICD-10-CM

## 2015-07-18 DIAGNOSIS — J309 Allergic rhinitis, unspecified: Secondary | ICD-10-CM | POA: Diagnosis not present

## 2015-07-18 DIAGNOSIS — J449 Chronic obstructive pulmonary disease, unspecified: Secondary | ICD-10-CM | POA: Diagnosis not present

## 2015-07-23 DIAGNOSIS — M67431 Ganglion, right wrist: Secondary | ICD-10-CM | POA: Diagnosis not present

## 2015-07-23 DIAGNOSIS — M25531 Pain in right wrist: Secondary | ICD-10-CM | POA: Diagnosis not present

## 2015-08-16 ENCOUNTER — Other Ambulatory Visit: Payer: Self-pay

## 2015-08-21 DIAGNOSIS — G4733 Obstructive sleep apnea (adult) (pediatric): Secondary | ICD-10-CM | POA: Diagnosis not present

## 2015-08-23 ENCOUNTER — Other Ambulatory Visit: Payer: Self-pay | Admitting: Family Medicine

## 2015-08-28 DIAGNOSIS — R0602 Shortness of breath: Secondary | ICD-10-CM | POA: Diagnosis not present

## 2015-09-03 ENCOUNTER — Other Ambulatory Visit: Payer: Self-pay

## 2015-09-05 ENCOUNTER — Other Ambulatory Visit: Payer: Self-pay

## 2015-09-09 ENCOUNTER — Ambulatory Visit (INDEPENDENT_AMBULATORY_CARE_PROVIDER_SITE_OTHER): Payer: Commercial Managed Care - HMO

## 2015-09-09 ENCOUNTER — Other Ambulatory Visit: Payer: Self-pay

## 2015-09-09 DIAGNOSIS — F419 Anxiety disorder, unspecified: Secondary | ICD-10-CM

## 2015-09-09 DIAGNOSIS — Z23 Encounter for immunization: Secondary | ICD-10-CM | POA: Diagnosis not present

## 2015-09-09 MED ORDER — FLUOXETINE HCL 10 MG PO TABS
10.0000 mg | ORAL_TABLET | Freq: Two times a day (BID) | ORAL | Status: DC
Start: 2015-09-09 — End: 2015-10-22

## 2015-09-20 DIAGNOSIS — G4733 Obstructive sleep apnea (adult) (pediatric): Secondary | ICD-10-CM | POA: Diagnosis not present

## 2015-09-27 ENCOUNTER — Ambulatory Visit (INDEPENDENT_AMBULATORY_CARE_PROVIDER_SITE_OTHER): Payer: Commercial Managed Care - HMO | Admitting: Family Medicine

## 2015-09-27 ENCOUNTER — Encounter: Payer: Self-pay | Admitting: Family Medicine

## 2015-09-27 VITALS — BP 130/70 | HR 72 | Temp 98.2°F | Ht 63.0 in | Wt 199.0 lb

## 2015-09-27 DIAGNOSIS — J40 Bronchitis, not specified as acute or chronic: Secondary | ICD-10-CM | POA: Diagnosis not present

## 2015-09-27 DIAGNOSIS — J441 Chronic obstructive pulmonary disease with (acute) exacerbation: Secondary | ICD-10-CM

## 2015-09-27 MED ORDER — GUAIFENESIN-CODEINE 100-10 MG/5ML PO SOLN: 5.0000 mL | Freq: Three times a day (TID) | ORAL | Status: DC | PRN

## 2015-09-27 MED ORDER — AZITHROMYCIN 250 MG PO TABS: ORAL_TABLET | ORAL | Status: DC

## 2015-09-27 NOTE — Progress Notes (Signed)
Name: Regina Macdonald   MRN: [id number]    DOB: 11/06/1941   Date:09/27/2015       Progress Note  Subjective  Chief Complaint  Chief Complaint  Patient presents with  . Cough    productive cough with headache across front of forehead    Cough This is a new problem. The current episode started in the past 7 days. The problem has been gradually worsening. The problem occurs every few minutes. The cough is productive of purulent sputum (yellow/green /blood tinged). Associated symptoms include headaches, hemoptysis, nasal congestion, postnasal drip, rhinorrhea, shortness of breath and wheezing. Pertinent negatives include no chest pain, chills, ear congestion, ear pain, fever, heartburn, myalgias, rash, sore throat, sweats or weight loss. Associated symptoms comments: Frontal area. The symptoms are aggravated by cold air. She has tried a beta-agonist inhaler and ipratropium inhaler for the symptoms. Her past medical history is significant for bronchitis and COPD. There is no history of environmental allergies.    No problem-specific assessment & plan notes found for this encounter.   Past Medical History  Diagnosis Date  . 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 COPD (chronic obstructive pulmonary disease) ([place])   . Sleep apnea   . Diverticulosis   . Asthma   . Diverticula, colon     Past Surgical History  Procedure Laterality Date  . Hip surgery Left   . Knee surgery Bilateral   . Nasal reconstruction      x 2    Family History  Problem Relation Age of Onset  . Cirrhosis Father   . Coronary artery disease Mother     Social History   Social History  . Marital Status: Divorced    Spouse Name: N/A  . Number of Children: N/A  . Years of Education: N/A   Occupational History  . Not on file.   Social History Main Topics  . Smoking status: Never Smoker   . Smokeless tobacco: Not on file  . Alcohol Use: No  . Drug Use: No  . Sexual Activity: No   Other Topics Concern  . Not  on file   Social History Narrative    Allergies  Allergen Reactions  . Ciprofloxacin Other (See Comments)  . Etodolac Other (See Comments)  . Sulfa Antibiotics Rash     Review of Systems  Constitutional: Negative for fever, chills, weight loss and malaise/fatigue.  HENT: Positive for postnasal drip and rhinorrhea. Negative for ear discharge, ear pain and sore throat.   Eyes: Negative for blurred vision.  Respiratory: Positive for cough, hemoptysis, shortness of breath and wheezing. Negative for sputum production.   Cardiovascular: Negative for chest pain, palpitations and leg swelling.  Gastrointestinal: Negative for heartburn, nausea, abdominal pain, diarrhea, constipation, blood in stool and melena.  Genitourinary: Negative for dysuria, urgency, frequency and hematuria.  Musculoskeletal: Negative for myalgias, back pain, joint pain and neck pain.  Skin: Negative for rash.  Neurological: Positive for headaches. Negative for dizziness, tingling, sensory change and focal weakness.  Endo/Heme/Allergies: Negative for environmental allergies and polydipsia. Does not bruise/bleed easily.  Psychiatric/Behavioral: Negative for depression and suicidal ideas. The patient is not nervous/anxious and does not have insomnia.      Objective  Filed Vitals:   09/27/15 0828  BP: 130/70  Pulse: 72  Temp: 98.2 F (36.8 C)  TempSrc: Oral  Height: 5\' 3"  (1.6 m)  Weight: 199 lb (90.266 kg)  SpO2: 98%    Physical Exam  Constitutional: She is well-developed, well-nourished, and in  no distress. No distress.  HENT:  Head: Normocephalic and atraumatic.  Right Ear: External ear normal.  Left Ear: External ear normal.  Nose: Nose normal.  Mouth/Throat: Oropharynx is clear and moist.  Eyes: Conjunctivae and EOM are normal. Pupils are equal, round, and reactive to light. Right eye exhibits no discharge. Left eye exhibits no discharge.  Neck: Normal range of motion. Neck supple. No JVD present.  No thyromegaly present.  Cardiovascular: Normal rate, regular rhythm, normal heart sounds and intact distal pulses.  Exam reveals no gallop and no friction rub.   No murmur heard. Pulmonary/Chest: Effort normal and breath sounds normal. No respiratory distress. She has no wheezes. She has no rales. She exhibits no tenderness.  Abdominal: Soft. Bowel sounds are normal. She exhibits no mass. There is no tenderness. There is no guarding.  Musculoskeletal: Normal range of motion. She exhibits no edema.  Lymphadenopathy:    She has no cervical adenopathy.  Neurological: She is alert. She has normal reflexes.  Skin: Skin is warm and dry. She is not diaphoretic.  Psychiatric: Mood and affect normal.      Assessment & Plan  Problem List Items Addressed This Visit      Respiratory   COPD exacerbation ([place])   Relevant Medications   guaiFENesin-codeine 100-10 MG/5ML syrup   azithromycin (ZITHROMAX) 250 MG tablet    Other Visit Diagnoses    Bronchitis    -  Primary    Relevant Medications    guaiFENesin-codeine 100-10 MG/5ML syrup    azithromycin (ZITHROMAX) 250 MG tablet         Dr. [facility]  09/27/2015

## 2015-10-01 ENCOUNTER — Other Ambulatory Visit: Payer: Self-pay | Admitting: Internal Medicine

## 2015-10-01 ENCOUNTER — Ambulatory Visit
Admission: RE | Admit: 2015-10-01 | Discharge: 2015-10-01 | Disposition: A | Discharge status: Home / Self Care | Payer: Commercial Managed Care - HMO | Source: Ambulatory Visit | Attending: Internal Medicine | Admitting: Internal Medicine

## 2015-10-01 DIAGNOSIS — G4733 Obstructive sleep apnea (adult) (pediatric): Secondary | ICD-10-CM | POA: Diagnosis not present

## 2015-10-01 DIAGNOSIS — R0602 Shortness of breath: Secondary | ICD-10-CM | POA: Insufficient documentation

## 2015-10-01 DIAGNOSIS — J209 Acute bronchitis, unspecified: Secondary | ICD-10-CM | POA: Diagnosis not present

## 2015-10-01 DIAGNOSIS — J449 Chronic obstructive pulmonary disease, unspecified: Secondary | ICD-10-CM | POA: Diagnosis not present

## 2015-10-22 ENCOUNTER — Other Ambulatory Visit: Payer: Self-pay

## 2015-10-22 DIAGNOSIS — F419 Anxiety disorder, unspecified: Secondary | ICD-10-CM

## 2015-10-22 DIAGNOSIS — E785 Hyperlipidemia, unspecified: Secondary | ICD-10-CM

## 2015-10-22 DIAGNOSIS — I10 Essential (primary) hypertension: Secondary | ICD-10-CM

## 2015-10-22 MED ORDER — FLUOXETINE HCL 10 MG PO TABS: 10.0000 mg | ORAL_TABLET | Freq: Two times a day (BID) | ORAL | Status: DC

## 2015-10-22 MED ORDER — GEMFIBROZIL 600 MG PO TABS: 600.0000 mg | ORAL_TABLET | Freq: Two times a day (BID) | ORAL | Status: DC

## 2015-10-22 MED ORDER — HYDRALAZINE HCL 10 MG PO TABS: 10.0000 mg | ORAL_TABLET | Freq: Three times a day (TID) | ORAL | Status: DC

## 2015-10-22 MED ORDER — FLUOXETINE HCL 20 MG PO CAPS: 20.0000 mg | ORAL_CAPSULE | Freq: Every day | ORAL | Status: DC

## 2015-11-20 ENCOUNTER — Ambulatory Visit (INDEPENDENT_AMBULATORY_CARE_PROVIDER_SITE_OTHER): Payer: Commercial Managed Care - HMO | Admitting: Family Medicine

## 2015-11-20 ENCOUNTER — Encounter: Payer: Self-pay | Admitting: Family Medicine

## 2015-11-20 VITALS — BP 130/68 | HR 76 | Ht 63.0 in | Wt 199.0 lb

## 2015-11-20 DIAGNOSIS — E785 Hyperlipidemia, unspecified: Secondary | ICD-10-CM

## 2015-11-20 DIAGNOSIS — F419 Anxiety disorder, unspecified: Secondary | ICD-10-CM | POA: Diagnosis not present

## 2015-11-20 MED ORDER — FLUOXETINE HCL 20 MG PO CAPS: 20.0000 mg | ORAL_CAPSULE | Freq: Every day | ORAL | Status: DC

## 2015-11-20 MED ORDER — GEMFIBROZIL 600 MG PO TABS: 600.0000 mg | ORAL_TABLET | Freq: Two times a day (BID) | ORAL | Status: DC

## 2015-11-20 MED ORDER — FLUOXETINE HCL 10 MG PO TABS: 10.0000 mg | ORAL_TABLET | Freq: Two times a day (BID) | ORAL | Status: DC

## 2015-11-20 NOTE — Progress Notes (Signed)
Name: Regina Macdonald   MRN: [id number]    DOB: 1941/11/06   Date:11/20/2015       Progress Note  Subjective  Chief Complaint  Chief Complaint  Patient presents with  . Hyperlipidemia  . Anxiety    Hyperlipidemia This is a chronic problem. The current episode started more than 1 year ago. The problem is controlled. Recent lipid tests were reviewed and are normal. She has no history of chronic renal disease, diabetes, hypothyroidism, liver disease, obesity or nephrotic syndrome. There are no known factors aggravating her hyperlipidemia. Pertinent negatives include no chest pain, focal sensory loss, focal weakness, leg pain, myalgias or shortness of breath. Current antihyperlipidemic treatment includes statins. The current treatment provides moderate improvement of lipids. There are no compliance problems.  Risk factors for coronary artery disease include dyslipidemia, obesity and post-menopausal.  Anxiety Presents for follow-up visit. Symptoms include nervous/anxious behavior. Patient reports no chest pain, confusion, decreased concentration, dizziness, excessive worry, hyperventilation, impotence, insomnia, irritability, malaise, muscle tension, nausea, obsessions, palpitations, panic, shortness of breath or suicidal ideas. Symptoms occur occasionally. The severity of symptoms is mild.      No problem-specific assessment & plan notes found for this encounter.   Past Medical History  Diagnosis Date  . 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 COPD (chronic obstructive pulmonary disease) ([place])   . Sleep apnea   . Diverticulosis   . Asthma   . Diverticula, colon   . GERD (gastroesophageal reflux disease)     Past Surgical History  Procedure Laterality Date  . Hip surgery Left   . Knee surgery Bilateral   . Nasal reconstruction      x 2    Family History  Problem Relation Age of Onset  . Cirrhosis Father   . Coronary artery disease Mother     Social History   Social History  .  Marital Status: Divorced    Spouse Name: N/A  . Number of Children: N/A  . Years of Education: N/A   Occupational History  . Not on file.   Social History Main Topics  . Smoking status: Never Smoker   . Smokeless tobacco: Not on file  . Alcohol Use: No  . Drug Use: No  . Sexual Activity: No   Other Topics Concern  . Not on file   Social History Narrative    Allergies  Allergen Reactions  . Ciprofloxacin Other (See Comments)  . Etodolac Other (See Comments)  . Sulfa Antibiotics Rash     Review of Systems  Constitutional: Negative for fever, chills, weight loss, malaise/fatigue and irritability.  HENT: Negative for ear discharge, ear pain and sore throat.   Eyes: Negative for blurred vision.  Respiratory: Negative for cough, sputum production, shortness of breath and wheezing.   Cardiovascular: Negative for chest pain, palpitations and leg swelling.  Gastrointestinal: Negative for heartburn, nausea, abdominal pain, diarrhea, constipation, blood in stool and melena.  Genitourinary: Negative for dysuria, urgency, frequency, hematuria and impotence.  Musculoskeletal: Negative for myalgias, back pain, joint pain and neck pain.  Skin: Negative for rash.  Neurological: Negative for dizziness, tingling, sensory change, focal weakness and headaches.  Endo/Heme/Allergies: Negative for environmental allergies and polydipsia. Does not bruise/bleed easily.  Psychiatric/Behavioral: Negative for depression, suicidal ideas, confusion and decreased concentration. The patient is nervous/anxious. The patient does not have insomnia.      Objective  Filed Vitals:   11/20/15 1359  BP: 130/68  Pulse: 76  Height: 5\' 3"  (1.6 m)  Weight: 199 lb (  90.266 kg)    Physical Exam  Constitutional: She is well-developed, well-nourished, and in no distress. No distress.  HENT:  Head: Normocephalic and atraumatic.  Right Ear: External ear normal.  Left Ear: External ear normal.  Nose: Nose  normal.  Mouth/Throat: Oropharynx is clear and moist.  Eyes: Conjunctivae and EOM are normal. Pupils are equal, round, and reactive to light. Right eye exhibits no discharge. Left eye exhibits no discharge.  Neck: Normal range of motion. Neck supple. No JVD present. No thyromegaly present.  Cardiovascular: Normal rate, regular rhythm, normal heart sounds and intact distal pulses.  Exam reveals no gallop and no friction rub.   No murmur heard. Pulmonary/Chest: Effort normal and breath sounds normal.  Abdominal: Soft. Bowel sounds are normal. She exhibits no mass. There is no tenderness. There is no guarding.  Musculoskeletal: Normal range of motion. She exhibits no edema.  Lymphadenopathy:    She has no cervical adenopathy.  Neurological: She is alert.  Skin: Skin is warm and dry. She is not diaphoretic.  Psychiatric: Mood and affect normal.  Nursing note and vitals reviewed.     Assessment & Plan  Problem List Items Addressed This Visit    None    Visit Diagnoses    Hyperlipidemia    -  Primary    Relevant Medications    gemfibrozil (LOPID) 600 MG tablet    Other Relevant Orders    Lipid Profile    Glucose    Chronic anxiety        Relevant Medications    FLUoxetine (PROZAC) 10 MG tablet    FLUoxetine (PROZAC) 20 MG capsule    Acute anxiety        Relevant Medications    FLUoxetine (PROZAC) 10 MG tablet    FLUoxetine (PROZAC) 20 MG capsule         Dr. Deanna [facility]  11/20/2015

## 2015-11-21 ENCOUNTER — Ambulatory Visit: Payer: Commercial Managed Care - HMO | Admitting: Family Medicine

## 2015-11-21 LAB — LIPID PANEL
CHOL/HDL RATIO: 4 ratio (ref 0.0–4.4)
Cholesterol, Total: 165 mg/dL (ref 100–199)
HDL: 41 mg/dL (ref >39)
LDL Calculated: 103 mg/dL — ABNORMAL HIGH (ref 0–99)
Triglycerides: 105 mg/dL (ref 0–149)
VLDL Cholesterol Cal: 21 mg/dL (ref 5–40)

## 2015-11-21 LAB — GLUCOSE, RANDOM: GLUCOSE: 79 mg/dL (ref 65–99)

## 2015-12-02 ENCOUNTER — Other Ambulatory Visit: Payer: Commercial Managed Care - HMO

## 2015-12-02 DIAGNOSIS — R739 Hyperglycemia, unspecified: Secondary | ICD-10-CM | POA: Diagnosis not present

## 2015-12-03 LAB — HEMOGLOBIN A1C
Est. average glucose Bld gHb Est-mCnc: 117 mg/dL
HEMOGLOBIN A1C: 5.7 % — AB (ref 4.8–5.6)

## 2015-12-24 DIAGNOSIS — G4733 Obstructive sleep apnea (adult) (pediatric): Secondary | ICD-10-CM | POA: Diagnosis not present

## 2016-01-14 ENCOUNTER — Other Ambulatory Visit: Payer: Self-pay

## 2016-01-15 ENCOUNTER — Encounter: Payer: Self-pay | Admitting: Family Medicine

## 2016-01-15 ENCOUNTER — Ambulatory Visit (INDEPENDENT_AMBULATORY_CARE_PROVIDER_SITE_OTHER): Payer: Commercial Managed Care - HMO | Admitting: Family Medicine

## 2016-01-15 VITALS — BP 110/64 | HR 72 | Ht 63.0 in | Wt 199.0 lb

## 2016-01-15 DIAGNOSIS — L570 Actinic keratosis: Secondary | ICD-10-CM | POA: Diagnosis not present

## 2016-01-15 NOTE — Patient Instructions (Signed)
Actinic Keratosis Actinic keratosis is a precancerous growth on the skin. This means it could develop into skin cancer if it is not treated. About 1% of actinic keratoses turn into skin cancer within a year. It is important to have all such growths removed to prevent them from developing into skin cancer. CAUSES  Actinic keratosis is caused by getting too much ultraviolet (UV) radiation from the sun or other UV light sources. RISK FACTORS Factors that increase your chances of getting actinic keratosis include:  Having light-colored skin and blue eyes.  Having blonde or red hair.  Spending a lot of time in the sun.  Age. The risk of actinic keratosis increases with age. SYMPTOMS  Actinic keratosis growths look like scaly, rough spots of skin. They can be as small as a pinhead or as big as a quarter. They may itch, hurt, or feel sensitive. Sometimes there is a little tag of pink or gray skin growing off them. In some cases, actinic keratoses are easier felt than seen. They do not go away with the use of moisturizing lotions or creams. Actinic keratoses appear most often on areas of skin that get a lot of sun exposure. These areas include the:  Scalp.  Face.  Ears.  Lips.  Upper back.  Backs of the hands.  Forearms. DIAGNOSIS  Your health care provider can usually tell what is wrong by performing a physical exam. A tissue sample (biopsy) may also be taken and examined under a microscope. TREATMENT  Actinic keratosis can be treated several ways. Most treatments can be done in your health care provider's office. Treatment options may include:  Curettage. A tool is used to gently scrape off the growth.  Cryosurgery. Liquid nitrogen is applied to the growth to freeze it. The growth eventually falls off the skin.  Medicated creams, such as 5-fluorouracil or imiquimod. The medicine destroys the cells in the growth.  Chemical peels. Chemicals are applied to the growth and the outer  layers of skin are peeled off.  Photodynamic therapy. A drug that makes your skin more sensitive to light is applied to the skin. A strong, blue light is aimed at the skin and destroys the growth. PREVENTION  To prevent future sun damage:  Try to avoid the sun between 10:00 a.m. and 4:00 p.m. when it is the strongest.  Use a sunscreen or sunblock with SPF 30 or greater.  Apply sunscreen at least 30 minutes before exposure to the sun.  Always wear protective hats, clothing, and sunglasses with UV protection.  Avoid medicines, herbs, and foods that increase your sensitivity to sunlight.  Avoid tanning beds. HOME CARE INSTRUCTIONS   If your skin was covered with a bandage, change and remove the bandage as directed by your health care provider.  Keep the treated area dry as directed by your health care provider.  Apply any creams as prescribed by your health care provider. Follow the directions carefully.  Check your skin regularly for any changes.  Visit a skin doctor (dermatologist) every year for a skin exam. SEEK MEDICAL CARE IF:   Your skin does not heal and becomes irritated, red, or bleeds.  You notice any changes or new growths on your skin.   This information is not intended to replace advice given to you by your health care provider. Make sure you discuss any questions you have with your health care provider.   Document Released: 01/29/2009 Document Revised: 11/23/2014 Document Reviewed: 12/14/2011 Elsevier Interactive Patient Education 2016 Elsevier   Inc.  

## 2016-01-15 NOTE — Progress Notes (Signed)
Name: Regina Macdonald   MRN: [id number]    DOB: [birth year]   Date:01/15/2016       Progress Note  Subjective  Chief Complaint  Chief Complaint  Patient presents with  . Rash    places on skin- 1 on L) cheek and other on chest- wants derm referral    Rash This is a new problem. The current episode started more than 1 month ago. The problem is unchanged. The affected locations include the face and chest. The rash is characterized by itchiness, redness and scaling. She was exposed to nothing. Pertinent negatives include no cough, diarrhea, fever, joint pain, shortness of breath or sore throat. Past treatments include nothing. The treatment provided no relief.    No problem-specific assessment & plan notes found for this encounter.   Past Medical History  Diagnosis Date  . 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 COPD (chronic obstructive pulmonary disease) ([place])   . Sleep apnea   . Diverticulosis   . Asthma   . Diverticula, colon   . GERD (gastroesophageal reflux disease)     Past Surgical History  Procedure Laterality Date  . Hip surgery Left   . Knee surgery Bilateral   . Nasal reconstruction      x 2    Family History  Problem Relation Age of Onset  . Cirrhosis Father   . Coronary artery disease Mother     Social History   Social History  . Marital Status: Divorced    Spouse Name: N/A  . Number of Children: N/A  . Years of Education: N/A   Occupational History  . Not on file.   Social History Main Topics  . Smoking status: Never Smoker   . Smokeless tobacco: Not on file  . Alcohol Use: No  . Drug Use: No  . Sexual Activity: No   Other Topics Concern  . Not on file   Social History Narrative    Allergies  Allergen Reactions  . Ciprofloxacin Other (See Comments)  . Etodolac Other (See Comments)  . Sulfa Antibiotics Rash     Review of Systems  Constitutional: Negative for fever, chills, weight loss and malaise/fatigue.  HENT: Negative for ear discharge,  ear pain and sore throat.   Eyes: Negative for blurred vision.  Respiratory: Negative for cough, sputum production, shortness of breath and wheezing.   Cardiovascular: Negative for chest pain, palpitations and leg swelling.  Gastrointestinal: Negative for heartburn, nausea, abdominal pain, diarrhea, constipation, blood in stool and melena.  Genitourinary: Negative for dysuria, urgency, frequency and hematuria.  Musculoskeletal: Negative for myalgias, back pain, joint pain and neck pain.  Skin: Positive for rash.  Neurological: Negative for dizziness, tingling, sensory change, focal weakness and headaches.  Endo/Heme/Allergies: Negative for environmental allergies and polydipsia. Does not bruise/bleed easily.  Psychiatric/Behavioral: Negative for depression and suicidal ideas. The patient is not nervous/anxious and does not have insomnia.      Objective  Filed Vitals:   01/15/16 0943  BP: 110/64  Pulse: 72  Height: 5\' 3"  (1.6 m)  Weight: 199 lb (90.266 kg)    Physical Exam  Constitutional: She is well-developed, well-nourished, and in no distress. No distress.  HENT:  Head: Normocephalic and atraumatic.  Right Ear: External ear normal.  Left Ear: External ear normal.  Nose: Nose normal.  Mouth/Throat: Oropharynx is clear and moist.  Eyes: Conjunctivae and EOM are normal. Pupils are equal, round, and reactive to light. Right eye exhibits no discharge. Left eye exhibits no discharge.  Neck: Normal range of motion. Neck supple. No JVD present. No thyromegaly present.  Cardiovascular: Normal rate, regular rhythm, normal heart sounds and intact distal pulses.  Exam reveals no gallop and no friction rub.   No murmur heard. Pulmonary/Chest: Effort normal and breath sounds normal.  Abdominal: Soft. Bowel sounds are normal. She exhibits no mass. There is no tenderness. There is no guarding.  Musculoskeletal: Normal range of motion. She exhibits no edema.  Lymphadenopathy:    She has no  cervical adenopathy.  Neurological: She is alert. She has normal reflexes.  Skin: Skin is warm and dry. Rash noted. Rash is macular. She is not diaphoretic. There is erythema.     Psychiatric: Mood and affect normal.  Nursing note and vitals reviewed.     Assessment & Plan  Problem List Items Addressed This Visit    None    Visit Diagnoses    Actinic keratosis of left cheek    -  Primary    Relevant Orders    Ambulatory referral to Dermatology    Actinic keratosis        Relevant Orders    Ambulatory referral to Dermatology         Dr. Otilio [facility]  01/15/2016

## 2016-01-16 DIAGNOSIS — G4733 Obstructive sleep apnea (adult) (pediatric): Secondary | ICD-10-CM | POA: Diagnosis not present

## 2016-01-16 DIAGNOSIS — R05 Cough: Secondary | ICD-10-CM | POA: Diagnosis not present

## 2016-01-16 DIAGNOSIS — R0602 Shortness of breath: Secondary | ICD-10-CM | POA: Diagnosis not present

## 2016-01-16 DIAGNOSIS — J449 Chronic obstructive pulmonary disease, unspecified: Secondary | ICD-10-CM | POA: Diagnosis not present

## 2016-01-21 ENCOUNTER — Other Ambulatory Visit: Payer: Self-pay | Admitting: Family Medicine

## 2016-01-21 ENCOUNTER — Other Ambulatory Visit: Payer: Self-pay

## 2016-01-21 DIAGNOSIS — I10 Essential (primary) hypertension: Secondary | ICD-10-CM

## 2016-01-21 MED ORDER — HYDRALAZINE HCL 10 MG PO TABS: 10.0000 mg | ORAL_TABLET | Freq: Three times a day (TID) | ORAL | Status: DC

## 2016-02-17 ENCOUNTER — Encounter: Payer: Self-pay | Admitting: Family Medicine

## 2016-02-17 ENCOUNTER — Ambulatory Visit (INDEPENDENT_AMBULATORY_CARE_PROVIDER_SITE_OTHER): Payer: Commercial Managed Care - HMO | Admitting: Family Medicine

## 2016-02-17 VITALS — BP 120/80 | HR 72 | Ht 63.0 in | Wt 200.0 lb

## 2016-02-17 DIAGNOSIS — N3090 Cystitis, unspecified without hematuria: Secondary | ICD-10-CM

## 2016-02-17 LAB — POCT URINALYSIS DIPSTICK
BILIRUBIN UA: NEGATIVE
Glucose, UA: NEGATIVE
Ketones, UA: NEGATIVE
NITRITE UA: NEGATIVE
PH UA: 6
Protein, UA: NEGATIVE
Spec Grav, UA: 1.02
UROBILINOGEN UA: 0.2

## 2016-02-17 MED ORDER — NITROFURANTOIN MONOHYD MACRO 100 MG PO CAPS: 100.0000 mg | ORAL_CAPSULE | Freq: Two times a day (BID) | ORAL | Status: DC

## 2016-02-17 NOTE — Progress Notes (Signed)
Name: Regina Macdonald   MRN: [id number]    DOB: Dec 28, 1940   Date:02/17/2016       Progress Note  Subjective  Chief Complaint  Chief Complaint  Patient presents with  . Urinary Tract Infection    not emptying bladder completely    Urinary Tract Infection  This is a new problem. The current episode started 1 to 4 weeks ago. The problem occurs every urination. The problem has been gradually worsening. The quality of the pain is described as aching. The pain is mild. Associated symptoms include frequency and urgency. Pertinent negatives include no chills, discharge, flank pain, hematuria, hesitancy or nausea. She has tried acetaminophen for the symptoms. The treatment provided no relief.    No problem-specific assessment & plan notes found for this encounter.   Past Medical History  Diagnosis Date  . 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 COPD (chronic obstructive pulmonary disease) ([place])   . Sleep apnea   . Diverticulosis   . Asthma   . Diverticula, colon   . GERD (gastroesophageal reflux disease)     Past Surgical History  Procedure Laterality Date  . Hip surgery Left   . Knee surgery Bilateral   . Nasal reconstruction      x 2    Family History  Problem Relation Age of Onset  . Cirrhosis Regina Macdonald   . Coronary artery disease Regina Macdonald     Social History   Social History  . Marital Status: Divorced    Spouse Name: N/A  . Number of Children: N/A  . Years of Education: N/A   Occupational History  . Not on file.   Social History Main Topics  . Smoking status: Never Smoker   . Smokeless tobacco: Not on file  . Alcohol Use: No  . Drug Use: No  . Sexual Activity: No   Other Topics Concern  . Not on file   Social History Narrative    Allergies  Allergen Reactions  . Ciprofloxacin Other (See Comments)  . Etodolac Other (See Comments)  . Sulfa Antibiotics Rash     Review of Systems  Constitutional: Negative for fever, chills, weight loss and malaise/fatigue.  HENT:  Negative for ear discharge, ear pain and sore throat.   Eyes: Negative for blurred vision.  Respiratory: Negative for cough, sputum production, shortness of breath and wheezing.   Cardiovascular: Negative for chest pain, palpitations and leg swelling.  Gastrointestinal: Negative for heartburn, nausea, abdominal pain, diarrhea, constipation, blood in stool and melena.  Genitourinary: Positive for urgency and frequency. Negative for dysuria, hesitancy, hematuria and flank pain.  Musculoskeletal: Negative for myalgias, back pain, joint pain and neck pain.  Skin: Negative for rash.  Neurological: Negative for dizziness, tingling, sensory change, focal weakness and headaches.  Endo/Heme/Allergies: Negative for environmental allergies and polydipsia. Does not bruise/bleed easily.  Psychiatric/Behavioral: Negative for depression and suicidal ideas. The patient is not nervous/anxious and does not have insomnia.      Objective  Filed Vitals:   02/17/16 1419  BP: 120/80  Pulse: 72  Height: 5\' 3"  (1.6 m)  Weight: 200 lb (90.719 kg)    Physical Exam  Constitutional: She is well-developed, well-nourished, and in no distress. No distress.  HENT:  Head: Normocephalic and atraumatic.  Right Ear: External ear normal.  Left Ear: External ear normal.  Nose: Nose normal.  Mouth/Throat: Oropharynx is clear and moist.  Eyes: Conjunctivae and EOM are normal. Pupils are equal, round, and reactive to light. Right eye exhibits no discharge.  Left eye exhibits no discharge.  Neck: Normal range of motion. Neck supple. No JVD present. No thyromegaly present.  Cardiovascular: Normal rate, regular rhythm, normal heart sounds and intact distal pulses.  Exam reveals no gallop and no friction rub.   No murmur heard. Pulmonary/Chest: Effort normal and breath sounds normal. No respiratory distress. She has no wheezes. She has no rales. She exhibits no tenderness.  Abdominal: Soft. Bowel sounds are normal. She  exhibits no mass. There is tenderness in the suprapubic area. There is no guarding.  Musculoskeletal: Normal range of motion. She exhibits no edema.  Lymphadenopathy:    She has no cervical adenopathy.  Neurological: She is alert.  Skin: Skin is warm and dry. She is not diaphoretic.  Psychiatric: Mood and affect normal.  Nursing note and vitals reviewed.     Assessment & Plan  Problem List Items Addressed This Visit    None    Visit Diagnoses    Cystitis    -  Primary    Relevant Medications    nitrofurantoin, macrocrystal-monohydrate, (MACROBID) 100 MG capsule    Other Relevant Orders    POCT Urinalysis Dipstick (Completed)         Dr. [facility]  02/17/2016

## 2016-02-25 ENCOUNTER — Encounter: Payer: Self-pay | Admitting: Family Medicine

## 2016-02-25 ENCOUNTER — Ambulatory Visit (INDEPENDENT_AMBULATORY_CARE_PROVIDER_SITE_OTHER): Payer: Commercial Managed Care - HMO | Admitting: Family Medicine

## 2016-02-25 VITALS — BP 118/62 | HR 68 | Ht 63.0 in | Wt 198.0 lb

## 2016-02-25 DIAGNOSIS — R350 Frequency of micturition: Secondary | ICD-10-CM | POA: Diagnosis not present

## 2016-02-25 DIAGNOSIS — Z0000 Encounter for general adult medical examination without abnormal findings: Secondary | ICD-10-CM

## 2016-02-25 MED ORDER — NITROFURANTOIN MONOHYD MACRO 100 MG PO CAPS: 100.0000 mg | ORAL_CAPSULE | Freq: Two times a day (BID) | ORAL | Status: DC

## 2016-02-25 NOTE — Progress Notes (Signed)
Name: Regina Macdonald   MRN: [id number]    DOB: 10/31/1941   Date:02/25/2016       Progress Note  Subjective  Chief Complaint  Chief Complaint  Patient presents with  . Medicare Wellness  . bladder prolapse    "feel like my bladder has fallen"    HPI Comments: Patient presents for Medicare Annual Wellness.    Urinary Frequency  This is a recurrent problem. The current episode started more than 1 month ago. The problem occurs intermittently. The problem has been gradually worsening. The quality of the pain is described as burning. The pain is at a severity of 1/10. There has been no fever. Associated symptoms include frequency and urgency. Pertinent negatives include no chills, flank pain, hematuria, hesitancy, nausea, sweats or vomiting. She has tried antibiotics for the symptoms. The treatment provided mild relief. There is no history of catheterization, kidney stones, recurrent UTIs or a urological procedure.    No problem-specific assessment & plan notes found for this encounter.   Past Medical History  Diagnosis Date  . 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 COPD (chronic obstructive pulmonary disease) ([place])   . Sleep apnea   . Diverticulosis   . Asthma   . Diverticula, colon   . GERD (gastroesophageal reflux disease)     Past Surgical History  Procedure Laterality Date  . Hip surgery Left   . Knee surgery Bilateral   . Nasal reconstruction      x 2  . Colonoscopy  06/08/2013    Dr Candace Cruise- small mouth diverticula    Family History  Problem Relation Age of Onset  . Cirrhosis Father   . Coronary artery disease Mother     Social History   Social History  . Marital Status: Divorced    Spouse Name: N/A  . Number of Children: N/A  . Years of Education: N/A   Occupational History  . Not on file.   Social History Main Topics  . Smoking status: Never Smoker   . Smokeless tobacco: Not on file  . Alcohol Use: No  . Drug Use: No  . Sexual Activity: No   Other Topics  Concern  . Not on file   Social History Narrative    Allergies  Allergen Reactions  . Ciprofloxacin Other (See Comments)  . Etodolac Other (See Comments)  . Sulfa Antibiotics Rash     Review of Systems  Constitutional: Negative for fever, chills, weight loss and malaise/fatigue.  HENT: Negative for ear discharge, ear pain and sore throat.   Eyes: Negative for blurred vision.  Respiratory: Negative for cough, sputum production, shortness of breath and wheezing.   Cardiovascular: Negative for chest pain, palpitations and leg swelling.  Gastrointestinal: Negative for heartburn, nausea, vomiting, abdominal pain, diarrhea, constipation, blood in stool and melena.  Genitourinary: Positive for urgency and frequency. Negative for dysuria, hesitancy, hematuria and flank pain.  Musculoskeletal: Negative for myalgias, back pain, joint pain and neck pain.  Skin: Negative for rash.  Neurological: Negative for dizziness, tingling, sensory change, focal weakness and headaches.  Endo/Heme/Allergies: Negative for environmental allergies and polydipsia. Does not bruise/bleed easily.  Psychiatric/Behavioral: Negative for depression and suicidal ideas. The patient is not nervous/anxious and does not have insomnia.      Objective  Filed Vitals:   02/25/16 0942  BP: 118/62  Pulse: 68  Height: 5\' 3"  (1.6 m)  Weight: 198 lb (89.812 kg)    Physical Exam  Constitutional: She is well-developed, well-nourished, and in no distress.  No distress.  HENT:  Head: Normocephalic and atraumatic.  Right Ear: External ear normal.  Left Ear: External ear normal.  Nose: Nose normal.  Mouth/Throat: Oropharynx is clear and moist.  Eyes: Conjunctivae and EOM are normal. Pupils are equal, round, and reactive to light. Right eye exhibits no discharge. Left eye exhibits no discharge.  Neck: Normal range of motion. Neck supple. No JVD present. No thyromegaly present.  Cardiovascular: Normal rate, regular rhythm,  normal heart sounds and intact distal pulses.  Exam reveals no gallop and no friction rub.   No murmur heard. Pulmonary/Chest: Effort normal and breath sounds normal.  Abdominal: Soft. Bowel sounds are normal. She exhibits no mass. There is no tenderness. There is no guarding.  Genitourinary: Vagina normal, right adnexa normal, left adnexa normal and vulva normal.  Musculoskeletal: Normal range of motion. She exhibits no edema.  Lymphadenopathy:    She has no cervical adenopathy.  Neurological: She is alert. She has normal reflexes.  Skin: Skin is warm and dry. She is not diaphoretic.  Psychiatric: Mood and affect normal.  Nursing note and vitals reviewed.     Assessment & Plan  Problem List Items Addressed This Visit    None    Visit Diagnoses    Medicare annual wellness visit, subsequent    -  Primary    safety normal/ cognitive CIT-6 0/ no falls    Urinary frequency        ? cystocele    Relevant Medications    nitrofurantoin, macrocrystal-monohydrate, (MACROBID) 100 MG capsule    Other Relevant Orders    Ambulatory referral to Urology    Urine Culture         Dr. Otilio Miu [facility]  02/25/2016

## 2016-02-26 DIAGNOSIS — G4733 Obstructive sleep apnea (adult) (pediatric): Secondary | ICD-10-CM | POA: Diagnosis not present

## 2016-02-27 LAB — PLEASE NOTE

## 2016-02-27 LAB — URINE CULTURE

## 2016-03-05 DIAGNOSIS — J441 Chronic obstructive pulmonary disease with (acute) exacerbation: Secondary | ICD-10-CM | POA: Diagnosis not present

## 2016-03-05 DIAGNOSIS — I10 Essential (primary) hypertension: Secondary | ICD-10-CM | POA: Diagnosis not present

## 2016-03-13 ENCOUNTER — Other Ambulatory Visit: Payer: Self-pay

## 2016-03-13 ENCOUNTER — Other Ambulatory Visit: Payer: Self-pay | Admitting: Family Medicine

## 2016-03-13 DIAGNOSIS — Z1231 Encounter for screening mammogram for malignant neoplasm of breast: Secondary | ICD-10-CM

## 2016-03-20 ENCOUNTER — Ambulatory Visit: Payer: Commercial Managed Care - HMO | Admitting: Urology

## 2016-03-24 ENCOUNTER — Other Ambulatory Visit: Payer: Self-pay | Admitting: Family Medicine

## 2016-03-24 DIAGNOSIS — G4733 Obstructive sleep apnea (adult) (pediatric): Secondary | ICD-10-CM | POA: Diagnosis not present

## 2016-03-26 ENCOUNTER — Ambulatory Visit
Admission: RE | Admit: 2016-03-26 | Discharge: 2016-03-26 | Disposition: A | Discharge status: Home / Self Care | Payer: Commercial Managed Care - HMO | Source: Ambulatory Visit | Attending: Family Medicine | Admitting: Family Medicine

## 2016-03-26 DIAGNOSIS — Z1231 Encounter for screening mammogram for malignant neoplasm of breast: Secondary | ICD-10-CM | POA: Insufficient documentation

## 2016-03-30 DIAGNOSIS — G4733 Obstructive sleep apnea (adult) (pediatric): Secondary | ICD-10-CM | POA: Diagnosis not present

## 2016-03-30 DIAGNOSIS — J449 Chronic obstructive pulmonary disease, unspecified: Secondary | ICD-10-CM | POA: Diagnosis not present

## 2016-04-17 ENCOUNTER — Ambulatory Visit: Payer: Commercial Managed Care - HMO | Admitting: Urology

## 2016-05-24 ENCOUNTER — Other Ambulatory Visit: Payer: Self-pay | Admitting: Family Medicine

## 2016-06-12 ENCOUNTER — Ambulatory Visit: Payer: Commercial Managed Care - HMO | Admitting: Urology

## 2016-06-22 ENCOUNTER — Encounter: Payer: Self-pay | Admitting: Family Medicine

## 2016-06-22 ENCOUNTER — Ambulatory Visit (INDEPENDENT_AMBULATORY_CARE_PROVIDER_SITE_OTHER): Payer: Commercial Managed Care - HMO | Admitting: Family Medicine

## 2016-06-22 VITALS — BP 110/58 | HR 84 | Ht 63.0 in | Wt 200.0 lb

## 2016-06-22 DIAGNOSIS — F419 Anxiety disorder, unspecified: Secondary | ICD-10-CM | POA: Diagnosis not present

## 2016-06-22 DIAGNOSIS — F418 Other specified anxiety disorders: Secondary | ICD-10-CM

## 2016-06-22 DIAGNOSIS — E785 Hyperlipidemia, unspecified: Secondary | ICD-10-CM | POA: Diagnosis not present

## 2016-06-22 DIAGNOSIS — I10 Essential (primary) hypertension: Secondary | ICD-10-CM | POA: Diagnosis not present

## 2016-06-22 MED ORDER — FLUOXETINE HCL 20 MG PO CAPS: 20.0000 mg | ORAL_CAPSULE | Freq: Every day | ORAL | 6 refills | Status: DC

## 2016-06-22 MED ORDER — GEMFIBROZIL 600 MG PO TABS: 600.0000 mg | ORAL_TABLET | Freq: Two times a day (BID) | ORAL | 6 refills | Status: DC

## 2016-06-22 MED ORDER — FLUOXETINE HCL 10 MG PO TABS: 10.0000 mg | ORAL_TABLET | Freq: Two times a day (BID) | ORAL | 6 refills | Status: DC

## 2016-06-22 NOTE — Progress Notes (Signed)
Name: Regina Macdonald   MRN: [id number]    DOB: [birth year]   Date:06/22/2016       Progress Note  Subjective  Chief Complaint  Chief Complaint  Patient presents with  . Hypertension  . Hyperlipidemia  . Anxiety    Patient sees Dr Humphrey Rolls for copd/on Breo   Hypertension  This is a chronic problem. The current episode started more than 1 year ago. The problem has been gradually improving since onset. The problem is controlled. Associated symptoms include anxiety. Pertinent negatives include no blurred vision, chest pain, headaches, malaise/fatigue, neck pain, orthopnea, palpitations, peripheral edema, PND, shortness of breath or sweats. There are no associated agents to hypertension. Risk factors for coronary artery disease include dyslipidemia. Treatments tried: hydalazine. The current treatment provides moderate improvement. There are no compliance problems.  There is no history of angina, kidney disease, CAD/MI, CVA, heart failure, left ventricular hypertrophy, PVD, renovascular disease or retinopathy. There is no history of chronic renal disease or a hypertension causing med.  Hyperlipidemia  This is a chronic problem. The current episode started more than 1 year ago. Recent lipid tests were reviewed and are normal. Exacerbating diseases include obesity. She has no history of chronic renal disease, diabetes or hypothyroidism. There are no known factors aggravating her hyperlipidemia. Pertinent negatives include no chest pain, focal sensory loss, focal weakness, leg pain, myalgias or shortness of breath. Current antihyperlipidemic treatment includes fibric acid derivatives and diet change. The current treatment provides moderate improvement of lipids. There are no compliance problems.  Risk factors for coronary artery disease include dyslipidemia.  Anxiety  Presents for follow-up visit. Symptoms include excessive worry. Patient reports no chest pain, confusion, depressed mood, dizziness, insomnia,  malaise, nausea, nervous/anxious behavior, palpitations, panic, shortness of breath or suicidal ideas. Symptoms occur occasionally. The severity of symptoms is mild. The quality of sleep is fair.      No problem-specific Assessment & Plan notes found for this encounter.   Past Medical History:  Diagnosis Date  . Asthma   . 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 COPD (chronic obstructive pulmonary disease) ([place])   . Diverticula, colon   . Diverticulosis   . GERD (gastroesophageal reflux disease)   . Sleep apnea     Past Surgical History:  Procedure Laterality Date  . ABDOMINAL HYSTERECTOMY    . COLONOSCOPY  06/08/2013   Dr Candace Cruise- small mouth diverticula  . HIP SURGERY Left   . KNEE SURGERY Bilateral   . NASAL RECONSTRUCTION     x 2    Family History  Problem Relation Age of Onset  . Cirrhosis Father   . Coronary artery disease Mother     Social History   Social History  . Marital status: Divorced    Spouse name: N/A  . Number of children: N/A  . Years of education: N/A   Occupational History  . Not on file.   Social History Main Topics  . Smoking status: Never Smoker  . Smokeless tobacco: Never Used  . Alcohol use No  . Drug use: No  . Sexual activity: No   Other Topics Concern  . Not on file   Social History Narrative  . No narrative on file    Allergies  Allergen Reactions  . Ciprofloxacin Other (See Comments)  . Etodolac Other (See Comments)  . Sulfa Antibiotics Rash     Review of Systems  Constitutional: Negative for chills, fever, malaise/fatigue and weight loss.  HENT: Negative for ear discharge, ear pain  and sore throat.   Eyes: Negative for blurred vision.  Respiratory: Negative for cough, sputum production, shortness of breath and wheezing.   Cardiovascular: Negative for chest pain, palpitations, orthopnea, leg swelling and PND.  Gastrointestinal: Negative for abdominal pain, blood in stool, constipation, diarrhea, heartburn, melena and nausea.   Genitourinary: Negative for dysuria, frequency, hematuria and urgency.  Musculoskeletal: Negative for back pain, joint pain, myalgias and neck pain.  Skin: Negative for rash.  Neurological: Negative for dizziness, tingling, sensory change, focal weakness and headaches.  Endo/Heme/Allergies: Negative for environmental allergies and polydipsia. Does not bruise/bleed easily.  Psychiatric/Behavioral: Negative for confusion, depression and suicidal ideas. The patient is not nervous/anxious and does not have insomnia.      Objective  Vitals:   06/22/16 1025  BP: (!) 110/58  Pulse: 84  Weight: 200 lb (90.7 kg)  Height: 5\' 3"  (1.6 m)    Physical Exam  Constitutional: She is well-developed, well-nourished, and in no distress. No distress.  HENT:  Head: Normocephalic and atraumatic.  Right Ear: External ear normal.  Left Ear: External ear normal.  Nose: Nose normal.  Mouth/Throat: Oropharynx is clear and moist.  Eyes: Conjunctivae and EOM are normal. Pupils are equal, round, and reactive to light. Right eye exhibits no discharge. Left eye exhibits no discharge.  Neck: Normal range of motion. Neck supple. No JVD present. No thyromegaly present.  Cardiovascular: Normal rate, regular rhythm, normal heart sounds and intact distal pulses.  Exam reveals no gallop and no friction rub.   No murmur heard. Pulmonary/Chest: Effort normal and breath sounds normal.  Abdominal: Soft. Bowel sounds are normal. She exhibits no mass. There is no tenderness. There is no guarding.  Musculoskeletal: Normal range of motion. She exhibits no edema.  Lymphadenopathy:    She has no cervical adenopathy.  Neurological: She is alert. She has normal reflexes.  Skin: Skin is warm and dry. She is not diaphoretic.  Psychiatric: Mood and affect normal.  Nursing note and vitals reviewed.     Assessment & Plan  Problem List Items Addressed This Visit    None    Visit Diagnoses    Essential hypertension    -   Primary   Relevant Medications   gemfibrozil (LOPID) 600 MG tablet   Other Relevant Orders   Renal Function Panel   Hyperlipidemia       Relevant Medications   gemfibrozil (LOPID) 600 MG tablet   Other Relevant Orders   Lipid Profile   Depression with anxiety       Relevant Medications   FLUoxetine (PROZAC) 10 MG tablet   FLUoxetine (PROZAC) 20 MG capsule   Acute anxiety       Relevant Medications   FLUoxetine (PROZAC) 10 MG tablet   FLUoxetine (PROZAC) 20 MG capsule        Dr. Deanna Jones [facility]  06/22/16

## 2016-06-23 LAB — RENAL FUNCTION PANEL
ALBUMIN: 4.5 g/dL (ref 3.5–4.8)
BUN/Creatinine Ratio: 17 (ref 12–28)
BUN: 12 mg/dL (ref 8–27)
CO2: 21 mmol/L (ref 18–29)
Calcium: 9.3 mg/dL (ref 8.7–10.3)
Chloride: 101 mmol/L (ref 96–106)
Creatinine, Ser: 0.7 mg/dL (ref 0.57–1.00)
GFR calc Af Amer: 97 mL/min/{1.73_m2} (ref >59)
GFR, EST NON AFRICAN AMERICAN: 84 mL/min/{1.73_m2} (ref >59)
GLUCOSE: 81 mg/dL (ref 65–99)
PHOSPHORUS: 2.4 mg/dL — AB (ref 2.5–4.5)
POTASSIUM: 4.4 mmol/L (ref 3.5–5.2)
Sodium: 140 mmol/L (ref 134–144)

## 2016-06-23 LAB — LIPID PANEL
CHOL/HDL RATIO: 3.9 ratio (ref 0.0–4.4)
Cholesterol, Total: 139 mg/dL (ref 100–199)
HDL: 36 mg/dL — AB (ref >39)
LDL Calculated: 80 mg/dL (ref 0–99)
TRIGLYCERIDES: 115 mg/dL (ref 0–149)
VLDL CHOLESTEROL CAL: 23 mg/dL (ref 5–40)

## 2016-07-01 DIAGNOSIS — G4733 Obstructive sleep apnea (adult) (pediatric): Secondary | ICD-10-CM | POA: Diagnosis not present

## 2016-07-02 ENCOUNTER — Other Ambulatory Visit: Payer: Self-pay

## 2016-07-02 DIAGNOSIS — K219 Gastro-esophageal reflux disease without esophagitis: Secondary | ICD-10-CM

## 2016-07-02 MED ORDER — PANTOPRAZOLE SODIUM 40 MG PO TBEC: 40.0000 mg | DELAYED_RELEASE_TABLET | Freq: Every day | ORAL | 5 refills | Status: DC

## 2016-07-23 ENCOUNTER — Other Ambulatory Visit: Payer: Self-pay | Admitting: Family Medicine

## 2016-07-23 ENCOUNTER — Other Ambulatory Visit: Payer: Self-pay

## 2016-07-23 DIAGNOSIS — J309 Allergic rhinitis, unspecified: Secondary | ICD-10-CM

## 2016-07-23 MED ORDER — FLUTICASONE PROPIONATE 50 MCG/ACT NA SUSP
2.0000 | Freq: Every day | NASAL | 1 refills | Status: DC
Start: 2016-07-23 — End: 2017-02-20

## 2016-08-03 ENCOUNTER — Ambulatory Visit (INDEPENDENT_AMBULATORY_CARE_PROVIDER_SITE_OTHER): Payer: Commercial Managed Care - HMO | Admitting: Family Medicine

## 2016-08-03 ENCOUNTER — Encounter: Payer: Self-pay | Admitting: Family Medicine

## 2016-08-03 VITALS — BP 130/62 | HR 72 | Ht 63.0 in | Wt 200.0 lb

## 2016-08-03 DIAGNOSIS — F419 Anxiety disorder, unspecified: Secondary | ICD-10-CM | POA: Diagnosis not present

## 2016-08-03 DIAGNOSIS — R030 Elevated blood-pressure reading, without diagnosis of hypertension: Secondary | ICD-10-CM | POA: Diagnosis not present

## 2016-08-03 NOTE — Patient Instructions (Signed)

## 2016-08-03 NOTE — Progress Notes (Signed)
Name: Regina Macdonald   MRN: [id number]    DOB: Aug 24, 1941   Date:08/03/2016       Progress Note  Subjective  Chief Complaint  Chief Complaint  Patient presents with  . Follow-up    recheck B/P off of med    Hypertension  This is a chronic problem. The problem has been gradually improving since onset. The problem is controlled. Associated symptoms include anxiety. Pertinent negatives include no blurred vision, chest pain, headaches, malaise/fatigue, neck pain, orthopnea, palpitations, peripheral edema, PND, shortness of breath or sweats. There are no associated agents to hypertension. There are no known risk factors for coronary artery disease. Past treatments include lifestyle changes. The current treatment provides mild improvement. There are no compliance problems.  There is no history of angina, kidney disease, CAD/MI, CVA, heart failure, left ventricular hypertrophy, PVD, renovascular disease or retinopathy. There is no history of chronic renal disease or a hypertension causing med.    No problem-specific Assessment & Plan notes found for this encounter.   Past Medical History:  Diagnosis Date  . Anxiety   . Asthma   . 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 COPD (chronic obstructive pulmonary disease) ([place])   . Depression   . Diverticula, colon   . Diverticulosis   . GERD (gastroesophageal reflux disease)   . Sleep apnea     Past Surgical History:  Procedure Laterality Date  . ABDOMINAL HYSTERECTOMY    . COLONOSCOPY  06/08/2013   Dr Candace Cruise- small mouth diverticula  . HIP SURGERY Left   . KNEE SURGERY Bilateral   . NASAL RECONSTRUCTION     x 2    Family History  Problem Relation Age of Onset  . Cirrhosis Father   . Coronary artery disease Mother     Social History   Social History  . Marital status: Divorced    Spouse name: N/A  . Number of children: N/A  . Years of education: N/A   Occupational History  . Not on file.   Social History Main Topics  . Smoking status:  Never Smoker  . Smokeless tobacco: Never Used  . Alcohol use No  . Drug use: No  . Sexual activity: No   Other Topics Concern  . Not on file   Social History Narrative  . No narrative on file    Allergies  Allergen Reactions  . Ciprofloxacin Other (See Comments)  . Etodolac Other (See Comments)  . Sulfa Antibiotics Rash     Review of Systems  Constitutional: Negative for chills, fever, malaise/fatigue and weight loss.  HENT: Negative for ear discharge, ear pain and sore throat.   Eyes: Negative for blurred vision.  Respiratory: Negative for cough, sputum production, shortness of breath and wheezing.   Cardiovascular: Negative for chest pain, palpitations, orthopnea, leg swelling and PND.  Gastrointestinal: Negative for abdominal pain, blood in stool, constipation, diarrhea, heartburn, melena and nausea.  Genitourinary: Negative for dysuria, frequency, hematuria and urgency.  Musculoskeletal: Negative for back pain, joint pain, myalgias and neck pain.  Skin: Negative for rash.  Neurological: Negative for dizziness, tingling, sensory change, focal weakness and headaches.  Endo/Heme/Allergies: Negative for environmental allergies and polydipsia. Does not bruise/bleed easily.  Psychiatric/Behavioral: Negative for depression and suicidal ideas. The patient is not nervous/anxious and does not have insomnia.      Objective  Vitals:   08/03/16 1025  BP: 130/62  Pulse: 72  Weight: 200 lb (90.7 kg)  Height: 5\' 3"  (1.6 m)    Physical  Exam  Constitutional: She is well-developed, well-nourished, and in no distress. No distress.  HENT:  Head: Normocephalic and atraumatic.  Right Ear: External ear normal.  Left Ear: External ear normal.  Nose: Nose normal.  Mouth/Throat: Oropharynx is clear and moist.  Eyes: Conjunctivae and EOM are normal. Pupils are equal, round, and reactive to light. Right eye exhibits no discharge. Left eye exhibits no discharge.  Neck: Normal range of  motion. Neck supple. No JVD present. No thyromegaly present.  Cardiovascular: Normal rate, regular rhythm, normal heart sounds and intact distal pulses.  Exam reveals no gallop and no friction rub.   No murmur heard. Pulmonary/Chest: Effort normal and breath sounds normal.  Abdominal: Soft. Bowel sounds are normal. She exhibits no mass. There is no tenderness. There is no guarding.  Musculoskeletal: Normal range of motion. She exhibits no edema.  Lymphadenopathy:    She has no cervical adenopathy.  Neurological: She is alert. She has normal reflexes.  Skin: Skin is warm and dry. She is not diaphoretic.  Psychiatric: Mood and affect normal.  Nursing note and vitals reviewed.     Assessment & Plan  Problem List Items Addressed This Visit    None    Visit Diagnoses    Elevated blood pressure (not hypertension)    -  Primary   Chronic anxiety            Dr. Otilio Miu [facility]  08/03/16

## 2016-09-23 DIAGNOSIS — G4733 Obstructive sleep apnea (adult) (pediatric): Secondary | ICD-10-CM | POA: Diagnosis not present

## 2016-09-24 DIAGNOSIS — J986 Disorders of diaphragm: Secondary | ICD-10-CM | POA: Diagnosis not present

## 2016-09-24 DIAGNOSIS — G4733 Obstructive sleep apnea (adult) (pediatric): Secondary | ICD-10-CM | POA: Diagnosis not present

## 2016-09-24 DIAGNOSIS — J449 Chronic obstructive pulmonary disease, unspecified: Secondary | ICD-10-CM | POA: Diagnosis not present

## 2016-10-01 DIAGNOSIS — G4733 Obstructive sleep apnea (adult) (pediatric): Secondary | ICD-10-CM | POA: Diagnosis not present

## 2016-10-23 ENCOUNTER — Emergency Department
Admission: EM | Admit: 2016-10-23 | Discharge: 2016-10-23 | Disposition: A | Discharge status: Home / Self Care | Payer: Commercial Managed Care - HMO | Attending: Emergency Medicine | Admitting: Emergency Medicine

## 2016-10-23 ENCOUNTER — Encounter: Payer: Self-pay | Admitting: Urgent Care

## 2016-10-23 DIAGNOSIS — Z7982 Long term (current) use of aspirin: Secondary | ICD-10-CM | POA: Diagnosis not present

## 2016-10-23 DIAGNOSIS — J45909 Unspecified asthma, uncomplicated: Secondary | ICD-10-CM | POA: Diagnosis not present

## 2016-10-23 DIAGNOSIS — J449 Chronic obstructive pulmonary disease, unspecified: Secondary | ICD-10-CM | POA: Insufficient documentation

## 2016-10-23 DIAGNOSIS — Z859 Personal history of malignant neoplasm, unspecified: Secondary | ICD-10-CM | POA: Diagnosis not present

## 2016-10-23 DIAGNOSIS — R040 Epistaxis: Secondary | ICD-10-CM | POA: Diagnosis not present

## 2016-10-23 MED ORDER — OXYMETAZOLINE HCL 0.05 % NA SOLN
1.0000 | Freq: Once | NASAL | Status: AC
  Administered 2016-10-23: 1 via NASAL
  Filled 2016-10-23: qty 15

## 2016-10-23 NOTE — ED Provider Notes (Signed)
[facility] Emergency Department Provider Note   First MD Initiated Contact with Patient 10/23/16 [phone]     (approximate)  I have reviewed the triage vital signs and the nursing notes.   HISTORY  Chief Complaint Epistaxis    HPI Regina Macdonald is a 75 y.o. female with below. of chronic medical conditions including NaSal Reconstruction presents to the emergency department with epistaxis 1 hour which is now resolved.   Past Medical History:  Diagnosis Date  . Anxiety   . Asthma   . 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 COPD (chronic obstructive pulmonary disease) ([place])   . Depression   . Diverticula, colon   . Diverticulosis   . GERD (gastroesophageal reflux disease)   . Sleep apnea     Patient Active Problem List   Diagnosis Date Noted  . Other chest pain 04/16/2015  . COPD exacerbation ([place]) 04/16/2015  . Sleep apnea 04/16/2015  . Diverticulosis of colon without hemorrhage 04/16/2015  . Asthmatic bronchitis 04/16/2015  . 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 ABDOMINAL HYSTERECTOMY    . COLONOSCOPY  06/08/2013   Dr Candace Cruise- small mouth diverticula  . HIP SURGERY Left   . KNEE SURGERY Bilateral   . NASAL RECONSTRUCTION     x 2    Prior to Admission medications   Medication Sig Start Date End Date Taking? Authorizing Provider  acetaminophen (TYLENOL) 650 MG CR tablet Take 1 tablet by mouth every 8 (eight) hours.    Historical Provider, MD  aspirin 81 MG chewable tablet Chew 1 tablet by mouth daily.    Historical Provider, MD  CALCIUM & MAGNESIUM CARBONATES PO Take 1 tablet by mouth daily.    Historical Provider, MD  Cyanocobalamin (VITAMIN B-12) 1000 MCG SUBL 1 tablet daily. 08/31/14   Historical Provider, MD  fexofenadine (ALLEGRA) 180 MG tablet Take 180 mg by mouth daily.    Historical Provider, MD  FLUoxetine (PROZAC) 10 MG tablet Take 1 tablet (10 mg total) by mouth 2 (two) times daily.  20mg  + 10mg  06/22/16   Juline Patch, MD  FLUoxetine (PROZAC) 20 MG capsule Take 1 capsule (20 mg total) by mouth daily. In am 06/22/16   Juline Patch, MD  fluticasone (FLONASE) 50 MCG/ACT nasal spray Place 2 sprays into both nostrils daily. 07/23/16   Juline Patch, MD  Fluticasone Furoate-Vilanterol 100-25 MCG/INH AEPB Inhale 1 spray into the lungs daily. Dr Humphrey Rolls    Historical Provider, MD  gemfibrozil (LOPID) 600 MG tablet Take 1 tablet (600 mg total) by mouth 2 (two) times daily. 06/22/16   Juline Patch, MD  montelukast (SINGULAIR) 10 MG tablet Take 10 mg by mouth daily. Dr Chancy Milroy    Historical Provider, MD  Multiple Vitamins-Minerals (CENTRUM SILVER PO) Take 1 capsule by mouth daily.    Historical Provider, MD  pantoprazole (PROTONIX) 40 MG tablet Take 1 tablet (40 mg total) by mouth daily. 07/02/16   Juline Patch, MD    Allergies Ciprofloxacin; Etodolac; and Sulfa antibiotics  Family History  Problem Relation Age of Onset  . Cirrhosis Father   . Coronary artery disease Mother     Social History Social History  Substance Use Topics  . Smoking status: Never Smoker  . Smokeless tobacco: Never Used  . Alcohol use No    Review of Systems Constitutional: No fever/chills Eyes: No visual changes. ENT: No sore throat.Positive for epistaxis Cardiovascular: Denies chest pain.  Respiratory: Denies shortness of breath. Gastrointestinal: No abdominal pain.  No nausea, no vomiting.  No diarrhea.  No constipation. Genitourinary: Negative for dysuria. Musculoskeletal: Negative for back pain. Skin: Negative for rash. Neurological: Negative for headaches, focal weakness or numbness.  10-point ROS otherwise negative.  ____________________________________________   PHYSICAL EXAM:  VITAL SIGNS: ED Triage Vitals  Enc Vitals Group     BP 10/23/16 0559 (!) 151/55     Pulse --      Resp 10/23/16 0559 16     Temp 10/23/16 0559 97.5 F (36.4 C)     Temp Source 10/23/16 0559 Oral     SpO2  10/23/16 0559 96 %     Weight 10/23/16 0600 199 lb (90.3 kg)     Height 10/23/16 0600 5\' 3"  (1.6 m)     Head Circumference --      Peak Flow --      Pain Score 10/23/16 0600 0     Pain Loc --      Pain Edu? --      Excl. in [place]? --     Constitutional: Alert and oriented. Well appearing and in no acute distress. Eyes: Conjunctivae are normal. PERRL. EOMI. Head: Atraumatic. Ears:  Healthy appearing ear canals and TMs bilaterally Nose:Evidence of recent bleeding from the left anterior Kiesselbach's plexus however no active bleeding at this time Mouth/Throat: Mucous membranes are moist.  Oropharynx non-erythematous. Neck: No stridor.   Cardiovascular: Normal rate, regular rhythm. Good peripheral circulation. Grossly normal heart sounds. Respiratory: Normal respiratory effort.  No retractions. Lungs CTAB. Gastrointestinal: Soft and nontender. No distention.  Musculoskeletal: No lower extremity tenderness nor edema. No gross deformities of extremities. Skin:  Skin is warm, dry and intact. No rash noted.    Procedures   _________   INITIAL IMPRESSION / ASSESSMENT AND PLAN / ED COURSE  Pertinent labs & imaging results that were available during my care of the patient were reviewed by me and considered in my medical decision making (see chart for details).     Clinical Course     ____________________________________________  FINAL CLINICAL IMPRESSION(S) / ED DIAGNOSES  Final diagnoses:  Epistaxis  Left-sided epistaxis     MEDICATIONS GIVEN DURING THIS VISIT:  Medications  oxymetazoline (AFRIN) 0.05 % nasal spray 1 spray (not administered)     NEW OUTPATIENT MEDICATIONS STARTED DURING THIS VISIT:  New Prescriptions   No medications on file    Modified Medications   No medications on file    Discontinued Medications   No medications on file     Note:  This document was prepared using Dragon voice recognition software and may include unintentional  dictation errors.    Gregor Hams, MD 10/28/16 [phone]

## 2016-10-23 NOTE — ED Notes (Signed)
Pt verbalized understanding of discharge instructions. NAD at this time. 

## 2016-10-23 NOTE — ED Triage Notes (Signed)
Patient presents with epistaxis event x 1 hour.

## 2016-11-25 DIAGNOSIS — R0602 Shortness of breath: Secondary | ICD-10-CM | POA: Diagnosis not present

## 2016-11-25 LAB — PULMONARY FUNCTION TEST

## 2016-12-21 ENCOUNTER — Other Ambulatory Visit: Payer: Self-pay | Admitting: Family Medicine

## 2016-12-21 ENCOUNTER — Telehealth: Payer: Self-pay

## 2016-12-21 ENCOUNTER — Other Ambulatory Visit: Payer: Self-pay

## 2016-12-21 DIAGNOSIS — K219 Gastro-esophageal reflux disease without esophagitis: Secondary | ICD-10-CM

## 2016-12-21 NOTE — Telephone Encounter (Signed)
Spoke to pt on phone concerning breast concern- she will call in the am to sched appt for this week for breast exam

## 2016-12-21 NOTE — Telephone Encounter (Signed)
Refill request: Fluticasone and Pantoprazole. Also wants to speak to Baxter Flattery and see when she needs to come back for follow up.

## 2016-12-23 ENCOUNTER — Ambulatory Visit
Admission: RE | Admit: 2016-12-23 | Discharge: 2016-12-23 | Disposition: A | Discharge status: Home / Self Care | Payer: Medicare HMO | Source: Ambulatory Visit | Attending: Family Medicine | Admitting: Family Medicine

## 2016-12-23 ENCOUNTER — Other Ambulatory Visit
Admission: RE | Admit: 2016-12-23 | Discharge: 2016-12-23 | Disposition: A | Discharge status: Home / Self Care | Payer: Medicare HMO | Source: Ambulatory Visit | Attending: Family Medicine | Admitting: Family Medicine

## 2016-12-23 ENCOUNTER — Ambulatory Visit (INDEPENDENT_AMBULATORY_CARE_PROVIDER_SITE_OTHER): Payer: Commercial Managed Care - HMO | Admitting: Family Medicine

## 2016-12-23 VITALS — BP 118/58 | HR 120 | Temp 99.5°F | Resp 16 | Ht 63.0 in | Wt 193.0 lb

## 2016-12-23 DIAGNOSIS — J219 Acute bronchiolitis, unspecified: Secondary | ICD-10-CM

## 2016-12-23 DIAGNOSIS — R509 Fever, unspecified: Secondary | ICD-10-CM | POA: Diagnosis not present

## 2016-12-23 DIAGNOSIS — J4520 Mild intermittent asthma, uncomplicated: Secondary | ICD-10-CM

## 2016-12-23 DIAGNOSIS — R05 Cough: Secondary | ICD-10-CM | POA: Diagnosis not present

## 2016-12-23 DIAGNOSIS — J111 Influenza due to unidentified influenza virus with other respiratory manifestations: Secondary | ICD-10-CM

## 2016-12-23 DIAGNOSIS — R69 Illness, unspecified: Secondary | ICD-10-CM | POA: Diagnosis not present

## 2016-12-23 LAB — CBC
HCT: 34.1 % — ABNORMAL LOW (ref 35.0–47.0)
HEMOGLOBIN: 11.2 g/dL — AB (ref 12.0–16.0)
MCH: 28.9 pg (ref 26.0–34.0)
MCHC: 32.8 g/dL (ref 32.0–36.0)
MCV: 88.2 fL (ref 80.0–100.0)
Platelets: 302 10*3/uL (ref 150–440)
RBC: 3.87 MIL/uL (ref 3.80–5.20)
RDW: 13.9 % (ref 11.5–14.5)
WBC: 8.9 10*3/uL (ref 3.6–11.0)

## 2016-12-23 MED ORDER — OSELTAMIVIR PHOSPHATE 75 MG PO CAPS: 75.0000 mg | ORAL_CAPSULE | Freq: Two times a day (BID) | ORAL | 0 refills | Status: DC

## 2016-12-23 MED ORDER — AZITHROMYCIN 250 MG PO TABS: ORAL_TABLET | ORAL | 0 refills | Status: DC

## 2016-12-23 NOTE — Progress Notes (Signed)
Name: Regina Macdonald   MRN: [id number]    DOB: 10-Jun-1941   Date:12/23/2016       Progress Note  Subjective  Chief Complaint  Chief Complaint  Patient presents with  . Bronchitis    cough, legs feel weak x 3 days    Fever   This is a new problem. The current episode started in the past 7 days. The problem occurs intermittently. The problem has been waxing and waning. The maximum temperature noted was 99 to 99.9 F. Associated symptoms include chest pain, coughing and muscle aches. Pertinent negatives include no abdominal pain, congestion, diarrhea, ear pain, headaches, nausea, rash, sleepiness, sore throat, urinary pain, vomiting or wheezing. Associated symptoms comments: Pain when cough. The treatment provided mild relief.  Risk factors: sick contacts   Risk factors: no recent sickness   Risk factors comment:  Funeral Saturday Cough  This is a new problem. The current episode started in the past 7 days. The problem has been gradually worsening. The cough is productive of purulent sputum. Associated symptoms include chest pain and a fever. Pertinent negatives include no chills, ear congestion, ear pain, headaches, heartburn, hemoptysis, myalgias, nasal congestion, postnasal drip, rash, rhinorrhea, sore throat, shortness of breath, sweats, weight loss or wheezing. The symptoms are aggravated by cold air. She has tried a beta-agonist inhaler and steroid inhaler for the symptoms. Her past medical history is significant for bronchitis. There is no history of environmental allergies.    No problem-specific Assessment & Plan notes found for this encounter.   Past Medical History:  Diagnosis Date  . Anxiety   . Asthma   . 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 COPD (chronic obstructive pulmonary disease) ([place])   . Depression   . Diverticula, colon   . Diverticulosis   . GERD (gastroesophageal reflux disease)   . Sleep apnea     Past Surgical History:  Procedure Laterality Date  . ABDOMINAL  HYSTERECTOMY    . COLONOSCOPY  06/08/2013   Dr Candace Cruise- small mouth diverticula  . HIP SURGERY Left   . KNEE SURGERY Bilateral   . NASAL RECONSTRUCTION     x 2    Family History  Problem Relation Age of Onset  . Cirrhosis Father   . Coronary artery disease Mother     Social History   Social History  . Marital status: Divorced    Spouse name: N/A  . Number of children: N/A  . Years of education: N/A   Occupational History  . Not on file.   Social History Main Topics  . Smoking status: Never Smoker  . Smokeless tobacco: Never Used  . Alcohol use No  . Drug use: No  . Sexual activity: No   Other Topics Concern  . Not on file   Social History Narrative  . No narrative on file    Allergies  Allergen Reactions  . Ciprofloxacin Other (See Comments)  . Etodolac Other (See Comments)  . Sulfa Antibiotics Rash     Review of Systems  Constitutional: Positive for fever and malaise/fatigue. Negative for chills, diaphoresis and weight loss.  HENT: Negative for congestion, ear discharge, ear pain, postnasal drip, rhinorrhea and sore throat.   Eyes: Negative for blurred vision.  Respiratory: Positive for cough. Negative for hemoptysis, sputum production, shortness of breath and wheezing.   Cardiovascular: Positive for chest pain. Negative for palpitations and leg swelling.  Gastrointestinal: Negative for abdominal pain, blood in stool, constipation, diarrhea, heartburn, melena, nausea and vomiting.  Genitourinary:  Negative for dysuria, frequency, hematuria and urgency.  Musculoskeletal: Negative for back pain, joint pain, myalgias and neck pain.  Skin: Negative for rash.  Neurological: Negative for dizziness, tingling, sensory change, focal weakness and headaches.  Endo/Heme/Allergies: Negative for environmental allergies and polydipsia. Does not bruise/bleed easily.  Psychiatric/Behavioral: Negative for depression and suicidal ideas. The patient is not nervous/anxious and does  not have insomnia.      Objective  Vitals:   12/23/16 0922  BP: (!) 118/58  Pulse: (!) 120  Resp: 16  Temp: 99.5 F (37.5 C)  SpO2: 97%  Weight: 193 lb (87.5 kg)  Height: 5\' 3"  (1.6 m)    Physical Exam  Constitutional: She is well-developed, well-nourished, and in no distress. No distress.  HENT:  Head: Normocephalic and atraumatic.  Right Ear: External ear normal.  Left Ear: External ear normal.  Nose: Nose normal.  Mouth/Throat: Oropharynx is clear and moist.  Eyes: Conjunctivae and EOM are normal. Pupils are equal, round, and reactive to light. Right eye exhibits no discharge. Left eye exhibits no discharge.  Neck: Normal range of motion. Neck supple. No JVD present. No thyromegaly present.  Cardiovascular: Normal rate, regular rhythm, normal heart sounds and intact distal pulses.  Exam reveals no gallop and no friction rub.   No murmur heard. Pulmonary/Chest: Effort normal. No respiratory distress. She has wheezes. She has no rales. She exhibits tenderness.  Abdominal: Soft. Bowel sounds are normal. She exhibits no mass. There is no tenderness. There is no guarding.  Musculoskeletal: Normal range of motion. She exhibits no edema.  Lymphadenopathy:    She has no cervical adenopathy.  Neurological: She is alert. She has normal reflexes.  Skin: Skin is warm and dry. She is not diaphoretic.  Psychiatric: Mood and affect normal.  Nursing note and vitals reviewed.     Assessment & Plan  Problem List Items Addressed This Visit    None    Visit Diagnoses    Influenza-like illness    -  Primary   Bronchiolitis       Relevant Medications   azithromycin (ZITHROMAX) 250 MG tablet   Other Relevant Orders   DG Chest 2 View   Fever, unspecified fever cause       Relevant Medications   oseltamivir (TAMIFLU) 75 MG capsule   azithromycin (ZITHROMAX) 250 MG tablet   Other Relevant Orders   DG Chest 2 View   Mild intermittent asthma, unspecified whether complicated        Relevant Orders   DG Chest 2 View    N/A I spent 25 minutes with this patient, More than 50% of that time was spent in face to face education, counseling and care coordination.   Dr. Mac[facility]  12/23/16

## 2016-12-24 DIAGNOSIS — G4733 Obstructive sleep apnea (adult) (pediatric): Secondary | ICD-10-CM | POA: Diagnosis not present

## 2016-12-24 DIAGNOSIS — R0602 Shortness of breath: Secondary | ICD-10-CM | POA: Diagnosis not present

## 2016-12-24 DIAGNOSIS — J449 Chronic obstructive pulmonary disease, unspecified: Secondary | ICD-10-CM | POA: Diagnosis not present

## 2016-12-25 ENCOUNTER — Other Ambulatory Visit: Payer: Self-pay

## 2016-12-28 ENCOUNTER — Other Ambulatory Visit: Payer: Self-pay

## 2017-01-04 DIAGNOSIS — G4733 Obstructive sleep apnea (adult) (pediatric): Secondary | ICD-10-CM | POA: Diagnosis not present

## 2017-01-13 IMAGING — MG MM DIGITAL SCREENING BILAT W/ CAD
1 series · 5 of 5 positions shown · non-contrast
Comparison: None.

CLINICAL DATA: Screening.

EXAM:
DIGITAL SCREENING BILATERAL MAMMOGRAM WITH CAD

[R CC · right · 5 of 5 slices shown]
[im 1/5]
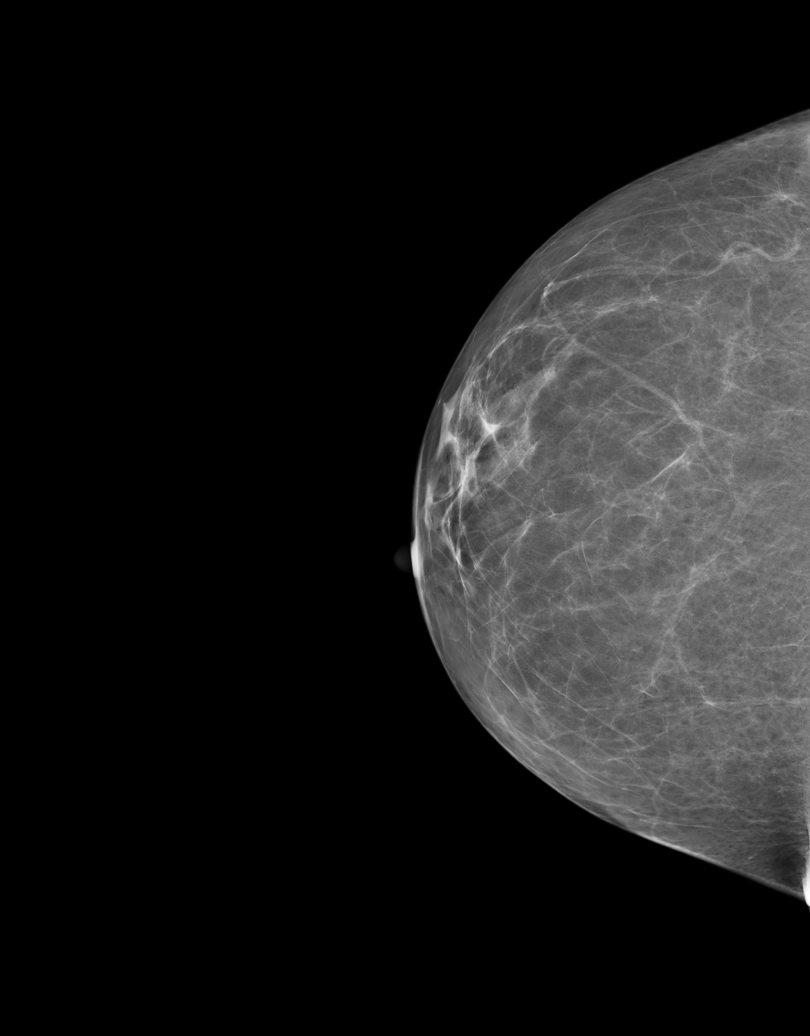
[im 2/5]
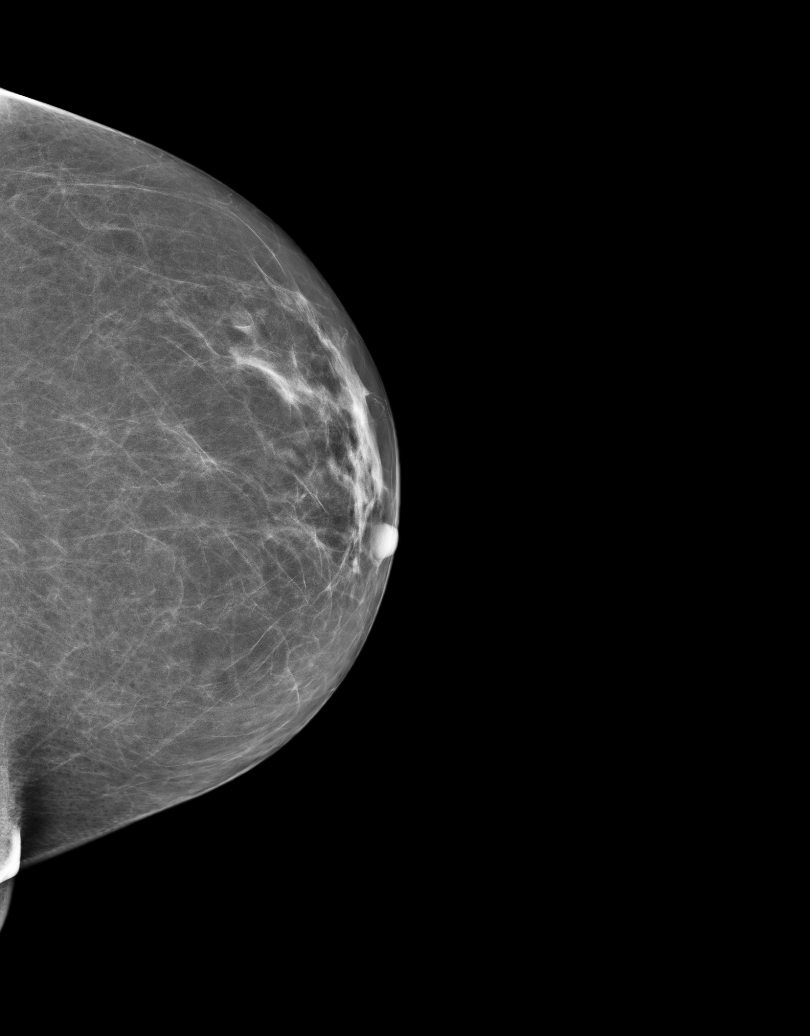
[im 3/5]
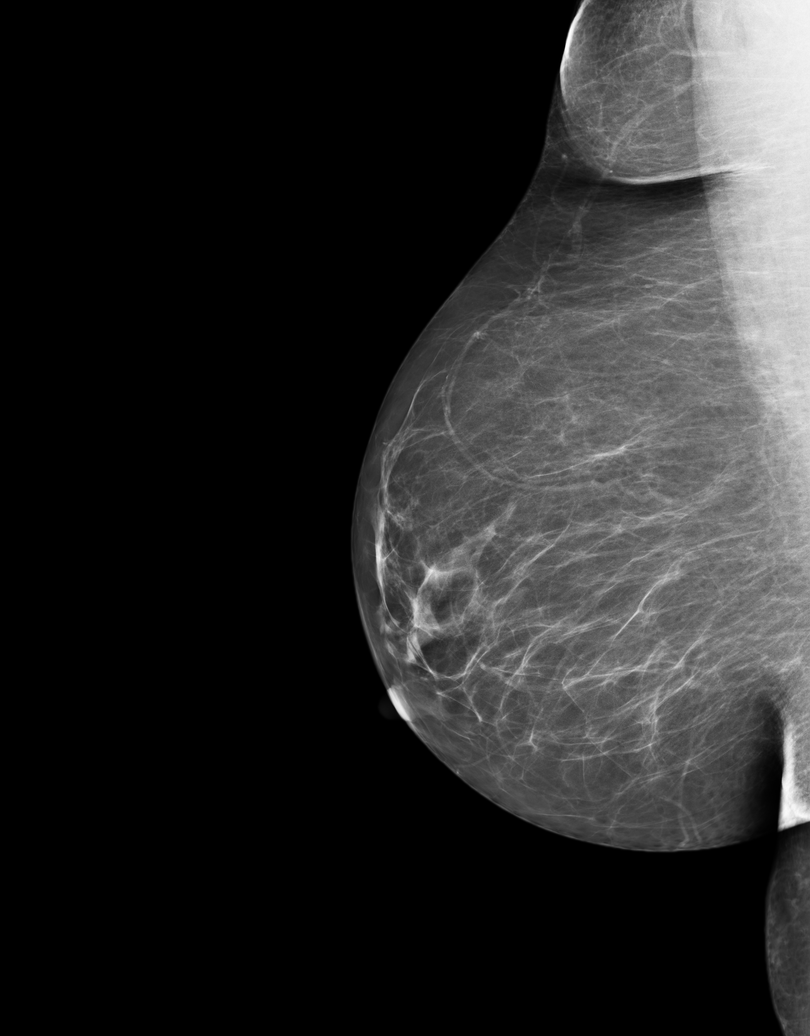
[im 4/5]
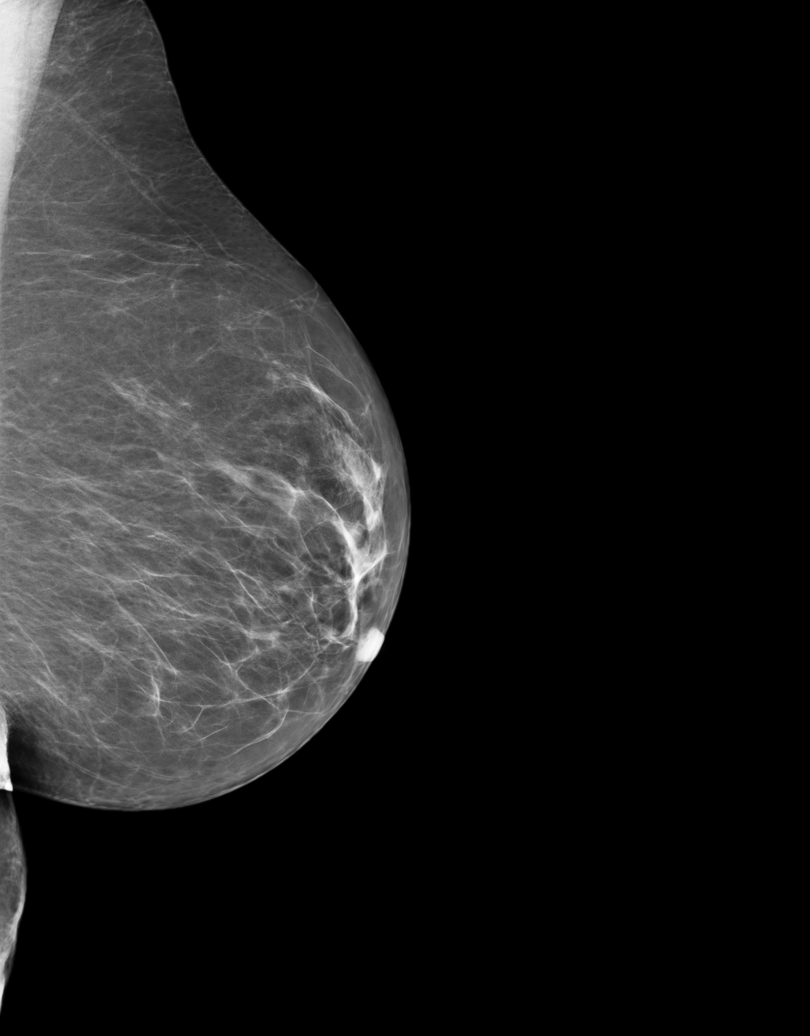
[im 5/5]
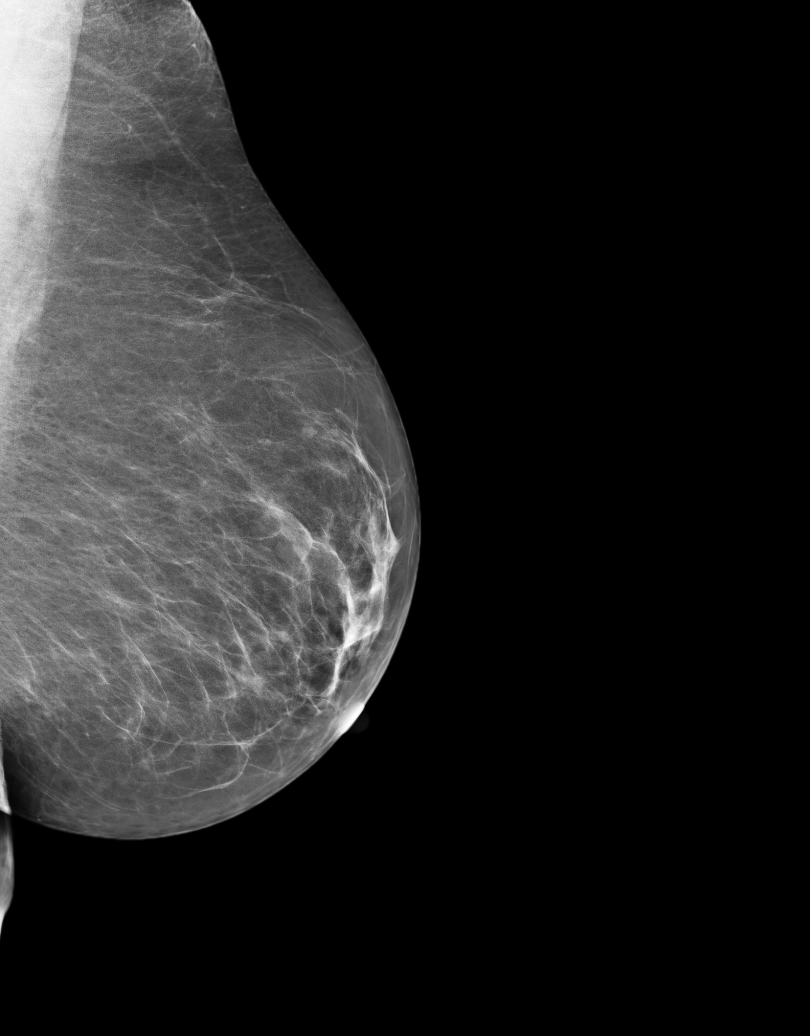

[5 of 5 positions shown; findings below may reference images not displayed]

ACR Breast Density Category b: There are scattered areas of
fibroglandular density.
FINDINGS: There are no findings suspicious for malignancy. Images were
processed with CAD.
IMPRESSION: No mammographic evidence of malignancy. A result letter of this
screening mammogram will be mailed directly to the patient.

RECOMMENDATION:
Screening mammogram in one year. (Code:[id number]-V-[id number])

BI-RADS CATEGORY  1: Negative.

## 2017-01-21 ENCOUNTER — Other Ambulatory Visit: Payer: Self-pay

## 2017-01-21 ENCOUNTER — Other Ambulatory Visit: Payer: Self-pay | Admitting: Family Medicine

## 2017-01-21 DIAGNOSIS — E785 Hyperlipidemia, unspecified: Secondary | ICD-10-CM

## 2017-01-21 DIAGNOSIS — F419 Anxiety disorder, unspecified: Secondary | ICD-10-CM

## 2017-01-21 DIAGNOSIS — F418 Other specified anxiety disorders: Secondary | ICD-10-CM

## 2017-01-21 DIAGNOSIS — K219 Gastro-esophageal reflux disease without esophagitis: Secondary | ICD-10-CM

## 2017-01-21 MED ORDER — GEMFIBROZIL 600 MG PO TABS: 600.0000 mg | ORAL_TABLET | Freq: Two times a day (BID) | ORAL | 0 refills | Status: DC

## 2017-01-21 MED ORDER — PANTOPRAZOLE SODIUM 40 MG PO TBEC: 40.0000 mg | DELAYED_RELEASE_TABLET | Freq: Every day | ORAL | 0 refills | Status: DC

## 2017-01-21 MED ORDER — FLUOXETINE HCL 10 MG PO TABS: 10.0000 mg | ORAL_TABLET | Freq: Two times a day (BID) | ORAL | 0 refills | Status: DC

## 2017-01-21 MED ORDER — FLUOXETINE HCL 20 MG PO CAPS: 20.0000 mg | ORAL_CAPSULE | Freq: Every day | ORAL | 0 refills | Status: DC

## 2017-02-18 ENCOUNTER — Other Ambulatory Visit: Payer: Self-pay

## 2017-02-20 ENCOUNTER — Other Ambulatory Visit: Payer: Self-pay | Admitting: Family Medicine

## 2017-02-20 DIAGNOSIS — J309 Allergic rhinitis, unspecified: Secondary | ICD-10-CM

## 2017-02-25 ENCOUNTER — Other Ambulatory Visit: Payer: Self-pay

## 2017-02-25 ENCOUNTER — Encounter: Payer: Commercial Managed Care - HMO | Admitting: Family Medicine

## 2017-02-25 DIAGNOSIS — F418 Other specified anxiety disorders: Secondary | ICD-10-CM

## 2017-02-25 DIAGNOSIS — E785 Hyperlipidemia, unspecified: Secondary | ICD-10-CM

## 2017-02-25 DIAGNOSIS — F419 Anxiety disorder, unspecified: Secondary | ICD-10-CM

## 2017-02-25 MED ORDER — GEMFIBROZIL 600 MG PO TABS: 600.0000 mg | ORAL_TABLET | Freq: Two times a day (BID) | ORAL | 0 refills | Status: DC

## 2017-02-25 MED ORDER — FLUOXETINE HCL 10 MG PO TABS: 10.0000 mg | ORAL_TABLET | Freq: Two times a day (BID) | ORAL | 0 refills | Status: DC

## 2017-02-25 MED ORDER — FLUOXETINE HCL 20 MG PO CAPS: 20.0000 mg | ORAL_CAPSULE | Freq: Every day | ORAL | 0 refills | Status: DC

## 2017-03-05 ENCOUNTER — Ambulatory Visit (INDEPENDENT_AMBULATORY_CARE_PROVIDER_SITE_OTHER): Payer: Medicare HMO | Admitting: Family Medicine

## 2017-03-05 ENCOUNTER — Encounter: Payer: Self-pay | Admitting: Family Medicine

## 2017-03-05 VITALS — BP 110/60 | HR 64 | Ht 63.0 in | Wt 191.0 lb

## 2017-03-05 DIAGNOSIS — F330 Major depressive disorder, recurrent, mild: Secondary | ICD-10-CM

## 2017-03-05 DIAGNOSIS — Z0000 Encounter for general adult medical examination without abnormal findings: Secondary | ICD-10-CM | POA: Diagnosis not present

## 2017-03-05 DIAGNOSIS — E782 Mixed hyperlipidemia: Secondary | ICD-10-CM | POA: Diagnosis not present

## 2017-03-05 DIAGNOSIS — K2100 Gastro-esophageal reflux disease with esophagitis, without bleeding: Secondary | ICD-10-CM

## 2017-03-05 DIAGNOSIS — F418 Other specified anxiety disorders: Secondary | ICD-10-CM | POA: Diagnosis not present

## 2017-03-05 DIAGNOSIS — F419 Anxiety disorder, unspecified: Secondary | ICD-10-CM | POA: Diagnosis not present

## 2017-03-05 DIAGNOSIS — E785 Hyperlipidemia, unspecified: Secondary | ICD-10-CM

## 2017-03-05 DIAGNOSIS — Z1239 Encounter for other screening for malignant neoplasm of breast: Secondary | ICD-10-CM

## 2017-03-05 DIAGNOSIS — Z1231 Encounter for screening mammogram for malignant neoplasm of breast: Secondary | ICD-10-CM | POA: Diagnosis not present

## 2017-03-05 DIAGNOSIS — K219 Gastro-esophageal reflux disease without esophagitis: Secondary | ICD-10-CM | POA: Diagnosis not present

## 2017-03-05 MED ORDER — FLUOXETINE HCL 20 MG PO CAPS: 20.0000 mg | ORAL_CAPSULE | Freq: Every day | ORAL | 5 refills | Status: DC

## 2017-03-05 MED ORDER — PANTOPRAZOLE SODIUM 40 MG PO TBEC: 40.0000 mg | DELAYED_RELEASE_TABLET | Freq: Every day | ORAL | 0 refills | Status: DC

## 2017-03-05 MED ORDER — GEMFIBROZIL 600 MG PO TABS: 600.0000 mg | ORAL_TABLET | Freq: Two times a day (BID) | ORAL | 0 refills | Status: DC

## 2017-03-05 MED ORDER — FLUOXETINE HCL 10 MG PO TABS: 10.0000 mg | ORAL_TABLET | Freq: Two times a day (BID) | ORAL | 0 refills | Status: DC

## 2017-03-05 NOTE — Progress Notes (Signed)
Name: Regina Macdonald   MRN: [id number]    DOB: 1941-10-04   Date:03/05/2017       Progress Note  Subjective  Chief Complaint  Chief Complaint  Patient presents with  . Annual Exam    no pap, needs mammo after 03/27/17  . Hyperlipidemia  . Gastroesophageal Reflux  . Anxiety    Patient presents for annual physical exam.   Depression         This is a chronic problem.  The current episode started more than 1 year ago.   The onset quality is gradual.   The problem has been gradually improving since onset.  Associated symptoms include no decreased concentration, no fatigue, no helplessness, no hopelessness, does not have insomnia, not irritable, no decreased interest, no appetite change, no myalgias, no headaches, not sad and no suicidal ideas.     The symptoms are aggravated by nothing.  Past treatments include SSRIs - Selective serotonin reuptake inhibitors.  Compliance with treatment is good.  Previous treatment provided mild relief.  Past medical history includes anxiety.     Pertinent negatives include no hypothyroidism. Anxiety  Presents for follow-up visit. Symptoms include depressed mood, excessive worry, irritability, nervous/anxious behavior and panic. Patient reports no chest pain, decreased concentration, dizziness, insomnia, nausea, palpitations, shortness of breath or suicidal ideas. The severity of symptoms is mild. The quality of sleep is good.    Gastroesophageal Reflux  She reports no abdominal pain, no belching, no chest pain, no choking, no coughing, no early satiety, no heartburn, no hoarse voice, no nausea, no sore throat or no wheezing. This is a recurrent problem. The current episode started more than 1 year ago. The problem has been waxing and waning. The symptoms are aggravated by certain foods. Pertinent negatives include no fatigue, melena or weight loss. The treatment provided mild relief. Past procedures do not include esophageal pH monitoring.  Hyperlipidemia  This  is a chronic problem. The problem is controlled. Recent lipid tests were reviewed and are normal. She has no history of chronic renal disease, diabetes, hypothyroidism, liver disease, obesity or nephrotic syndrome. Pertinent negatives include no chest pain, focal sensory loss, focal weakness, leg pain, myalgias or shortness of breath. Current antihyperlipidemic treatment includes fibric acid derivatives and statins. The current treatment provides moderate improvement of lipids. There are no compliance problems.  Risk factors for coronary artery disease include dyslipidemia.    No problem-specific Assessment & Plan notes found for this encounter.   Past Medical History:  Diagnosis Date  . Anxiety   . Asthma   . 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 COPD (chronic obstructive pulmonary disease) ([place])   . Depression   . Diverticula, colon   . Diverticulosis   . GERD (gastroesophageal reflux disease)   . Sleep apnea     Past Surgical History:  Procedure Laterality Date  . ABDOMINAL HYSTERECTOMY    . COLONOSCOPY  06/08/2013   Dr Candace Cruise- small mouth diverticula  . HIP SURGERY Left   . KNEE SURGERY Bilateral   . NASAL RECONSTRUCTION     x 2    Family History  Problem Relation Age of Onset  . Cirrhosis Father   . Coronary artery disease Mother     Social History   Social History  . Marital status: Divorced    Spouse name: N/A  . Number of children: N/A  . Years of education: N/A   Occupational History  . Not on file.   Social History Main Topics  .  Smoking status: Never Smoker  . Smokeless tobacco: Never Used  . Alcohol use No  . Drug use: No  . Sexual activity: No   Other Topics Concern  . Not on file   Social History Narrative  . No narrative on file    Allergies  Allergen Reactions  . Ciprofloxacin Other (See Comments)  . Etodolac Other (See Comments)  . Sulfa Antibiotics Rash    Outpatient Medications Prior to Visit  Medication Sig Dispense Refill  . acetaminophen  (TYLENOL) 650 MG CR tablet Take 1 tablet by mouth every 8 (eight) hours.    Marland Kitchen aspirin 81 MG chewable tablet Chew 1 tablet by mouth daily.    Marland Kitchen CALCIUM & MAGNESIUM CARBONATES PO Take 1 tablet by mouth daily.    . Cyanocobalamin (VITAMIN B-12) 1000 MCG SUBL 1 tablet daily.    . fexofenadine (ALLEGRA) 180 MG tablet Take 180 mg by mouth daily.    . fluticasone (FLONASE) 50 MCG/ACT nasal spray USE TWO SPRAY(S) IN EACH NOSTRIL ONCE DAILY 16 g 11  . Fluticasone Furoate-Vilanterol 100-25 MCG/INH AEPB Inhale 1 spray into the lungs daily. Dr Humphrey Rolls    . montelukast (SINGULAIR) 10 MG tablet Take 10 mg by mouth daily. Dr Chancy Milroy    . Multiple Vitamins-Minerals (CENTRUM SILVER PO) Take 1 capsule by mouth daily.    Marland Kitchen FLUoxetine (PROZAC) 10 MG tablet Take 1 tablet (10 mg total) by mouth 2 (two) times daily. 20mg  + 10mg  60 tablet 0  . FLUoxetine (PROZAC) 20 MG capsule Take 1 capsule (20 mg total) by mouth daily. In am 30 capsule 0  . gemfibrozil (LOPID) 600 MG tablet Take 1 tablet (600 mg total) by mouth 2 (two) times daily. 60 tablet 0  . pantoprazole (PROTONIX) 40 MG tablet Take 1 tablet (40 mg total) by mouth daily. 30 tablet 0  . azithromycin (ZITHROMAX) 250 MG tablet 2 today then 1 a day for 4 days 6 tablet 0  . oseltamivir (TAMIFLU) 75 MG capsule Take 1 capsule (75 mg total) by mouth 2 (two) times daily. 10 capsule 0   No facility-administered medications prior to visit.     Review of Systems  Constitutional: Positive for irritability. Negative for appetite change, chills, fatigue, fever, malaise/fatigue and weight loss.  HENT: Negative for ear discharge, ear pain, hoarse voice and sore throat.   Eyes: Negative for blurred vision.  Respiratory: Negative for cough, sputum production, choking, shortness of breath and wheezing.   Cardiovascular: Negative for chest pain, palpitations and leg swelling.  Gastrointestinal: Negative for abdominal pain, blood in stool, constipation, diarrhea, heartburn, melena and  nausea.  Genitourinary: Negative for dysuria, frequency, hematuria and urgency.  Musculoskeletal: Negative for back pain, joint pain, myalgias and neck pain.  Skin: Negative for rash.  Neurological: Negative for dizziness, tingling, sensory change, focal weakness and headaches.  Endo/Heme/Allergies: Negative for environmental allergies and polydipsia. Does not bruise/bleed easily.  Psychiatric/Behavioral: Positive for depression. Negative for decreased concentration and suicidal ideas. The patient is nervous/anxious. The patient does not have insomnia.      Objective  Vitals:   03/05/17 0943  BP: 110/60  Pulse: 64  Weight: 191 lb (86.6 kg)  Height: 5\' 3"  (1.6 m)    Physical Exam  Constitutional: She is well-developed, well-nourished, and in no distress. She is not irritable. No distress.  HENT:  Head: Normocephalic and atraumatic.  Right Ear: Tympanic membrane, external ear and ear canal normal.  Left Ear: Tympanic membrane, external ear and ear canal  normal.  Nose: Nose normal.  Mouth/Throat: Oropharynx is clear and moist. No oropharyngeal exudate, posterior oropharyngeal edema or posterior oropharyngeal erythema.  Eyes: Conjunctivae and EOM are normal. Pupils are equal, round, and reactive to light. Right eye exhibits no discharge. Left eye exhibits no discharge.  Fundoscopic exam:      The right eye shows no arteriolar narrowing and no AV nicking.       The left eye shows no arteriolar narrowing and no AV nicking.  Neck: Normal range of motion. Neck supple. Normal carotid pulses, no hepatojugular reflux and no JVD present. Carotid bruit is not present. No thyromegaly present.  Cardiovascular: Normal rate, regular rhythm, S1 normal, S2 normal, normal heart sounds, intact distal pulses and normal pulses.  Exam reveals no gallop, no S3, no S4 and no friction rub.   No murmur heard. Pulmonary/Chest: Effort normal and breath sounds normal. She has no wheezes. She has no rales. Right  breast exhibits no inverted nipple, no mass, no nipple discharge, no skin change and no tenderness. Left breast exhibits no inverted nipple, no mass, no nipple discharge, no skin change and no tenderness. Breasts are symmetrical.  Abdominal: Soft. Bowel sounds are normal. She exhibits no mass. There is no hepatosplenomegaly. There is no tenderness. There is no guarding and no CVA tenderness.  Musculoskeletal: Normal range of motion. She exhibits no edema.       Cervical back: Normal.       Thoracic back: Normal.       Lumbar back: Normal.  Lymphadenopathy:       Head (right side): No submandibular adenopathy present.       Head (left side): No submandibular adenopathy present.    She has no cervical adenopathy.    She has no axillary adenopathy.  Neurological: She is alert. She has normal sensation, normal strength, normal reflexes and intact cranial nerves.  Skin: Skin is warm and dry. She is not diaphoretic.  Psychiatric: Mood and affect normal.  Nursing note and vitals reviewed.     Assessment & Plan  Problem List Items Addressed This Visit    None    Visit Diagnoses    Annual physical exam    -  Primary   Relevant Orders   MM Digital Screening   Mixed hyperlipidemia       Relevant Medications   gemfibrozil (LOPID) 600 MG tablet   Gastroesophageal reflux disease with esophagitis       Mild episode of recurrent major depressive disorder (HCC)       Relevant Medications   FLUoxetine (PROZAC) 20 MG capsule   FLUoxetine (PROZAC) 10 MG tablet   Anxiety       Relevant Medications   FLUoxetine (PROZAC) 20 MG capsule   FLUoxetine (PROZAC) 10 MG tablet   Hyperlipidemia, unspecified hyperlipidemia type       Relevant Medications   gemfibrozil (LOPID) 600 MG tablet   Gastroesophageal reflux disease without esophagitis       Relevant Medications   pantoprazole (PROTONIX) 40 MG tablet   Acute anxiety       Relevant Medications   FLUoxetine (PROZAC) 20 MG capsule   FLUoxetine  (PROZAC) 10 MG tablet   Depression with anxiety       Relevant Medications   FLUoxetine (PROZAC) 20 MG capsule   FLUoxetine (PROZAC) 10 MG tablet   Breast cancer screening       Relevant Orders   MM Digital Screening      Meds ordered  this encounter  Medications  . gemfibrozil (LOPID) 600 MG tablet    Sig: Take 1 tablet (600 mg total) by mouth 2 (two) times daily.    Dispense:  60 tablet    Refill:  0  . pantoprazole (PROTONIX) 40 MG tablet    Sig: Take 1 tablet (40 mg total) by mouth daily.    Dispense:  30 tablet    Refill:  0    Please consider 90 day supplies to promote better adherence  . DISCONTD: FLUoxetine (PROZAC) 10 MG tablet    Sig: Take 1 tablet (10 mg total) by mouth 2 (two) times daily. 20mg  + 10mg     Dispense:  60 tablet    Refill:  0  . FLUoxetine (PROZAC) 20 MG capsule    Sig: Take 1 capsule (20 mg total) by mouth daily. In am with the 10=30mg  in am    Dispense:  30 capsule    Refill:  5  . FLUoxetine (PROZAC) 10 MG tablet    Sig: Take 1 tablet (10 mg total) by mouth 2 (two) times daily. Take bid- take with a 20mg = 30 mg in am and take 10mg  by itself at night    Dispense:  60 tablet    Refill:  0      Dr. Shadai Mcclane [facility]  03/05/17

## 2017-03-25 ENCOUNTER — Other Ambulatory Visit: Payer: Self-pay

## 2017-03-25 DIAGNOSIS — F418 Other specified anxiety disorders: Secondary | ICD-10-CM

## 2017-03-25 DIAGNOSIS — K219 Gastro-esophageal reflux disease without esophagitis: Secondary | ICD-10-CM

## 2017-03-25 DIAGNOSIS — F419 Anxiety disorder, unspecified: Secondary | ICD-10-CM

## 2017-03-25 DIAGNOSIS — E785 Hyperlipidemia, unspecified: Secondary | ICD-10-CM

## 2017-03-25 MED ORDER — GEMFIBROZIL 600 MG PO TABS: 600.0000 mg | ORAL_TABLET | Freq: Two times a day (BID) | ORAL | 0 refills | Status: DC

## 2017-03-25 MED ORDER — PANTOPRAZOLE SODIUM 40 MG PO TBEC: 40.0000 mg | DELAYED_RELEASE_TABLET | Freq: Every day | ORAL | 3 refills | Status: DC

## 2017-03-25 MED ORDER — FLUOXETINE HCL 10 MG PO TABS: 10.0000 mg | ORAL_TABLET | Freq: Two times a day (BID) | ORAL | 3 refills | Status: DC

## 2017-04-05 DIAGNOSIS — G4733 Obstructive sleep apnea (adult) (pediatric): Secondary | ICD-10-CM | POA: Diagnosis not present

## 2017-04-06 ENCOUNTER — Ambulatory Visit
Admission: RE | Admit: 2017-04-06 | Discharge: 2017-04-06 | Disposition: A | Discharge status: Home / Self Care | Payer: Medicare HMO | Source: Ambulatory Visit | Attending: Family Medicine | Admitting: Family Medicine

## 2017-04-06 DIAGNOSIS — Z1239 Encounter for other screening for malignant neoplasm of breast: Secondary | ICD-10-CM

## 2017-04-06 DIAGNOSIS — Z1231 Encounter for screening mammogram for malignant neoplasm of breast: Secondary | ICD-10-CM | POA: Diagnosis not present

## 2017-04-06 DIAGNOSIS — Z0000 Encounter for general adult medical examination without abnormal findings: Secondary | ICD-10-CM

## 2017-04-13 ENCOUNTER — Telehealth: Payer: Self-pay

## 2017-04-13 DIAGNOSIS — H25813 Combined forms of age-related cataract, bilateral: Secondary | ICD-10-CM | POA: Diagnosis not present

## 2017-04-13 NOTE — Telephone Encounter (Signed)
Attempted to call to schedule MAW and no answer

## 2017-05-17 DIAGNOSIS — J449 Chronic obstructive pulmonary disease, unspecified: Secondary | ICD-10-CM | POA: Diagnosis not present

## 2017-05-17 DIAGNOSIS — G4733 Obstructive sleep apnea (adult) (pediatric): Secondary | ICD-10-CM | POA: Diagnosis not present

## 2017-05-17 DIAGNOSIS — R0602 Shortness of breath: Secondary | ICD-10-CM | POA: Diagnosis not present

## 2017-05-22 ENCOUNTER — Other Ambulatory Visit: Payer: Self-pay | Admitting: Family Medicine

## 2017-05-22 DIAGNOSIS — E785 Hyperlipidemia, unspecified: Secondary | ICD-10-CM

## 2017-05-24 ENCOUNTER — Telehealth: Payer: Self-pay

## 2017-05-24 NOTE — Telephone Encounter (Signed)
Left message wants refill on cholesterol med. No refills at pharmacy.

## 2017-05-25 ENCOUNTER — Other Ambulatory Visit: Payer: Self-pay

## 2017-05-25 DIAGNOSIS — E785 Hyperlipidemia, unspecified: Secondary | ICD-10-CM

## 2017-05-25 MED ORDER — GEMFIBROZIL 600 MG PO TABS: 600.0000 mg | ORAL_TABLET | Freq: Two times a day (BID) | ORAL | 0 refills | Status: DC

## 2017-05-25 NOTE — Telephone Encounter (Signed)
Sent into [facility] [place] (gemfibrozil)

## 2017-06-21 ENCOUNTER — Ambulatory Visit: Payer: Medicare HMO

## 2017-06-21 ENCOUNTER — Telehealth: Payer: Self-pay | Admitting: Family Medicine

## 2017-06-21 NOTE — Telephone Encounter (Signed)
Called pt to re- schedule for Annual Wellness Visit with Nurse Health Advisor, Tiffany Hill, my c/b # is [phone]  Kathryn Brown ° °

## 2017-07-06 DIAGNOSIS — G4733 Obstructive sleep apnea (adult) (pediatric): Secondary | ICD-10-CM | POA: Diagnosis not present

## 2017-07-16 ENCOUNTER — Other Ambulatory Visit: Payer: Self-pay | Admitting: Family Medicine

## 2017-07-16 DIAGNOSIS — E785 Hyperlipidemia, unspecified: Secondary | ICD-10-CM

## 2017-07-20 ENCOUNTER — Emergency Department: Payer: Medicare HMO

## 2017-07-20 ENCOUNTER — Ambulatory Visit (INDEPENDENT_AMBULATORY_CARE_PROVIDER_SITE_OTHER): Payer: Medicare HMO | Admitting: Family Medicine

## 2017-07-20 ENCOUNTER — Emergency Department
Admission: EM | Admit: 2017-07-20 | Discharge: 2017-07-20 | Disposition: A | Discharge status: Home / Self Care | Payer: Medicare HMO | Attending: Emergency Medicine | Admitting: Emergency Medicine

## 2017-07-20 ENCOUNTER — Encounter: Payer: Self-pay | Admitting: Family Medicine

## 2017-07-20 ENCOUNTER — Encounter: Payer: Self-pay | Admitting: Emergency Medicine

## 2017-07-20 ENCOUNTER — Other Ambulatory Visit: Payer: Self-pay

## 2017-07-20 VITALS — BP 102/62 | HR 88 | Temp 98.0°F | Ht 63.0 in | Wt 191.0 lb

## 2017-07-20 DIAGNOSIS — R109 Unspecified abdominal pain: Secondary | ICD-10-CM | POA: Diagnosis not present

## 2017-07-20 DIAGNOSIS — R1031 Right lower quadrant pain: Secondary | ICD-10-CM | POA: Diagnosis not present

## 2017-07-20 DIAGNOSIS — K5900 Constipation, unspecified: Secondary | ICD-10-CM

## 2017-07-20 DIAGNOSIS — Z7982 Long term (current) use of aspirin: Secondary | ICD-10-CM | POA: Diagnosis not present

## 2017-07-20 DIAGNOSIS — K5730 Diverticulosis of large intestine without perforation or abscess without bleeding: Secondary | ICD-10-CM

## 2017-07-20 DIAGNOSIS — Z79899 Other long term (current) drug therapy: Secondary | ICD-10-CM | POA: Diagnosis not present

## 2017-07-20 DIAGNOSIS — J449 Chronic obstructive pulmonary disease, unspecified: Secondary | ICD-10-CM | POA: Diagnosis not present

## 2017-07-20 DIAGNOSIS — J45909 Unspecified asthma, uncomplicated: Secondary | ICD-10-CM | POA: Diagnosis not present

## 2017-07-20 LAB — POCT URINALYSIS DIPSTICK
BILIRUBIN UA: NEGATIVE
Blood, UA: NEGATIVE
GLUCOSE UA: NEGATIVE
Ketones, UA: NEGATIVE
LEUKOCYTES UA: NEGATIVE
Nitrite, UA: NEGATIVE
PH UA: 6 (ref 5.0–8.0)
Spec Grav, UA: 1.01 (ref 1.010–1.025)
Urobilinogen, UA: 0.2 E.U./dL

## 2017-07-20 LAB — HEMOCCULT GUIAC POC 1CARD (OFFICE): FECAL OCCULT BLD: NEGATIVE

## 2017-07-20 LAB — URINALYSIS, COMPLETE (UACMP) WITH MICROSCOPIC
BACTERIA UA: NONE SEEN
Bilirubin Urine: NEGATIVE
Glucose, UA: NEGATIVE mg/dL
Hgb urine dipstick: NEGATIVE
Ketones, ur: NEGATIVE mg/dL
Nitrite: NEGATIVE
Protein, ur: NEGATIVE mg/dL
SPECIFIC GRAVITY, URINE: 1.014 (ref 1.005–1.030)
pH: 6 (ref 5.0–8.0)

## 2017-07-20 LAB — COMPREHENSIVE METABOLIC PANEL
ALBUMIN: 4.3 g/dL (ref 3.5–5.0)
ALK PHOS: 104 U/L (ref 38–126)
ALT: 22 U/L (ref 14–54)
ANION GAP: 9 (ref 5–15)
AST: 34 U/L (ref 15–41)
BUN: 16 mg/dL (ref 6–20)
CALCIUM: 9.3 mg/dL (ref 8.9–10.3)
CO2: 27 mmol/L (ref 22–32)
Chloride: 101 mmol/L (ref 101–111)
Creatinine, Ser: 0.72 mg/dL (ref 0.44–1.00)
GFR calc Af Amer: >60 mL/min (ref >60)
GFR calc non Af Amer: >60 mL/min (ref >60)
GLUCOSE: 86 mg/dL (ref 65–99)
Potassium: 4.5 mmol/L (ref 3.5–5.1)
SODIUM: 137 mmol/L (ref 135–145)
Total Bilirubin: 0.5 mg/dL (ref 0.3–1.2)
Total Protein: 7.7 g/dL (ref 6.5–8.1)

## 2017-07-20 LAB — CBC
HEMATOCRIT: 34.9 % — AB (ref 35.0–47.0)
HEMOGLOBIN: 11.7 g/dL — AB (ref 12.0–16.0)
MCH: 29.5 pg (ref 26.0–34.0)
MCHC: 33.7 g/dL (ref 32.0–36.0)
MCV: 87.5 fL (ref 80.0–100.0)
Platelets: 301 10*3/uL (ref 150–440)
RBC: 3.98 MIL/uL (ref 3.80–5.20)
RDW: 14.9 % — ABNORMAL HIGH (ref 11.5–14.5)
WBC: 9.8 10*3/uL (ref 3.6–11.0)

## 2017-07-20 LAB — LIPASE, BLOOD: Lipase: 16 U/L (ref 11–51)

## 2017-07-20 MED ORDER — IOPAMIDOL (ISOVUE-300) INJECTION 61%
100.0000 mL | Freq: Once | INTRAVENOUS | Status: AC | PRN
  Administered 2017-07-20: 100 mL via INTRAVENOUS
  Filled 2017-07-20: qty 100

## 2017-07-20 MED ORDER — POLYETHYLENE GLYCOL 3350 17 G PO PACK: 17.0000 g | PACK | Freq: Every day | ORAL | 0 refills | Status: AC

## 2017-07-20 MED ORDER — DOCUSATE SODIUM 100 MG PO CAPS: 100.0000 mg | ORAL_CAPSULE | Freq: Every day | ORAL | 2 refills | Status: DC | PRN

## 2017-07-20 MED ORDER — IOPAMIDOL (ISOVUE-300) INJECTION 61%
30.0000 mL | Freq: Once | INTRAVENOUS | Status: AC | PRN
  Administered 2017-07-20: 30 mL via ORAL
  Filled 2017-07-20: qty 30

## 2017-07-20 NOTE — Progress Notes (Signed)
Name: Regina Macdonald   MRN: [id number]    DOB: 01/27/1941   Date:07/20/2017       Progress Note  Subjective  Chief Complaint  Chief Complaint  Patient presents with  . Abdominal Pain    Hx of diverticulitis. Has constipation    Abdominal Pain  This is a new problem. The current episode started in the past 7 days. The onset quality is gradual. The problem occurs constantly. The most recent episode lasted 4 days. The problem has been waxing and waning. The pain is located in the suprapubic region. The pain is at a severity of 7/10. The pain is moderate. The quality of the pain is colicky. The abdominal pain does not radiate. Associated symptoms include anorexia, constipation and flatus. Pertinent negatives include no arthralgias, belching, diarrhea, dysuria, fever, frequency, headaches, hematochezia, hematuria, melena, myalgias, nausea, vomiting or weight loss. The pain is aggravated by certain positions and movement. The pain is relieved by being still. Treatments tried: miralax. Prior diagnostic workup includes GI consult and lower endoscopy (colonoscopy 2008/2014). There is no history of abdominal surgery, colon cancer, Crohn's disease, gallstones, GERD, irritable bowel syndrome, pancreatitis, PUD or ulcerative colitis.  Constipation  This is a recurrent problem. The current episode started more than 1 month ago. The problem has been waxing and waning since onset. Associated symptoms include abdominal pain, anorexia and flatus. Pertinent negatives include no back pain, diarrhea, fever, hematochezia, melena, nausea, vomiting or weight loss. There is no history of abdominal surgery or irritable bowel syndrome.    No problem-specific Assessment & Plan notes found for this encounter.   Past Medical History:  Diagnosis Date  . Anxiety   . Asthma   . Cancer ([place]) cervical-1973  . COPD (chronic obstructive pulmonary disease) ([place])   . Depression   . Diverticula, colon   . Diverticulosis   .  GERD (gastroesophageal reflux disease)   . Sleep apnea     Past Surgical History:  Procedure Laterality Date  . ABDOMINAL HYSTERECTOMY    . COLONOSCOPY  06/08/2013   Dr Candace Cruise- small mouth diverticula  . HIP SURGERY Left   . KNEE SURGERY Bilateral   . NASAL RECONSTRUCTION     x 2    Family History  Problem Relation Age of Onset  . Cirrhosis Father   . Coronary artery disease Mother   . Breast cancer Neg Hx     Social History   Social History  . Marital status: Divorced    Spouse name: N/A  . Number of children: N/A  . Years of education: N/A   Occupational History  . Not on file.   Social History Main Topics  . Smoking status: Never Smoker  . Smokeless tobacco: Never Used  . Alcohol use No  . Drug use: No  . Sexual activity: No   Other Topics Concern  . Not on file   Social History Narrative  . No narrative on file    Allergies  Allergen Reactions  . Ciprofloxacin Other (See Comments)  . Etodolac Other (See Comments)  . Sulfa Antibiotics Rash    Outpatient Medications Prior to Visit  Medication Sig Dispense Refill  . acetaminophen (TYLENOL) 650 MG CR tablet Take 1 tablet by mouth every 8 (eight) hours.    Marland Kitchen aspirin 81 MG chewable tablet Chew 1 tablet by mouth daily.    Marland Kitchen CALCIUM & MAGNESIUM CARBONATES PO Take 1 tablet by mouth daily.    . cholecalciferol (VITAMIN D) 400 units TABS  tablet Take 400 Units by mouth daily.    . Cyanocobalamin (VITAMIN B-12) 1000 MCG SUBL 1 tablet daily.    . fexofenadine (ALLEGRA) 180 MG tablet Take 180 mg by mouth daily.    Marland Kitchen FLUoxetine (PROZAC) 10 MG tablet Take 1 tablet (10 mg total) by mouth 2 (two) times daily. Take bid- take with a 20mg = 30 mg in am and take 10mg  by itself at night 60 tablet 3  . fluticasone (FLONASE) 50 MCG/ACT nasal spray USE TWO SPRAY(S) IN EACH NOSTRIL ONCE DAILY 16 g 11  . Fluticasone Furoate-Vilanterol 100-25 MCG/INH AEPB Inhale 1 spray into the lungs daily. Dr Humphrey Rolls    . gemfibrozil (LOPID) 600 MG  tablet TAKE 1 TABLET BY MOUTH TWICE DAILY 60 tablet 0  . montelukast (SINGULAIR) 10 MG tablet Take 10 mg by mouth daily. Dr Chancy Milroy    . Multiple Vitamins-Minerals (CENTRUM SILVER PO) Take 1 capsule by mouth daily.    . pantoprazole (PROTONIX) 40 MG tablet Take 1 tablet (40 mg total) by mouth daily. 30 tablet 3   No facility-administered medications prior to visit.     Review of Systems  Constitutional: Negative for chills, fever, malaise/fatigue and weight loss.  HENT: Negative for ear discharge, ear pain and sore throat.   Eyes: Negative for blurred vision.  Respiratory: Negative for cough, sputum production, shortness of breath and wheezing.   Cardiovascular: Negative for chest pain, palpitations and leg swelling.  Gastrointestinal: Positive for abdominal pain, anorexia, constipation and flatus. Negative for blood in stool, diarrhea, heartburn, hematochezia, melena, nausea and vomiting.  Genitourinary: Negative for dysuria, frequency, hematuria and urgency.  Musculoskeletal: Negative for arthralgias, back pain, joint pain, myalgias and neck pain.  Skin: Negative for rash.  Neurological: Negative for dizziness, tingling, sensory change, focal weakness and headaches.  Endo/Heme/Allergies: Negative for environmental allergies and polydipsia. Does not bruise/bleed easily.  Psychiatric/Behavioral: Negative for depression and suicidal ideas. The patient is not nervous/anxious and does not have insomnia.      Objective  Vitals:   07/20/17 1210  BP: 102/62  Pulse: 88  Temp: 98 F (36.7 C)  Weight: 191 lb (86.6 kg)  Height: 5\' 3"  (1.6 m)    Physical Exam  Constitutional: She is well-developed, well-nourished, and in no distress. No distress.  HENT:  Head: Normocephalic and atraumatic.  Right Ear: External ear normal.  Left Ear: External ear normal.  Nose: Nose normal.  Mouth/Throat: Oropharynx is clear and moist.  Eyes: Pupils are equal, round, and reactive to light. Conjunctivae  and EOM are normal. Right eye exhibits no discharge. Left eye exhibits no discharge.  Neck: Normal range of motion. Neck supple. No JVD present. No thyromegaly present.  Cardiovascular: Normal rate, regular rhythm, normal heart sounds and intact distal pulses.  Exam reveals no gallop and no friction rub.   No murmur heard. Pulmonary/Chest: Effort normal and breath sounds normal. She has no wheezes. She has no rales.  Abdominal: Soft. Normal aorta. She exhibits no mass. Bowel sounds are hypoactive. There is no hepatosplenomegaly. There is tenderness in the right lower quadrant, periumbilical area, suprapubic area and left lower quadrant. There is guarding. There is no rigidity, no rebound and no CVA tenderness.  Genitourinary: Rectal exam shows no external hemorrhoid and guaiac negative stool.  Musculoskeletal: Normal range of motion. She exhibits no edema.  Lymphadenopathy:    She has no cervical adenopathy.  Neurological: She is alert. She has normal reflexes.  Skin: Skin is warm and dry. She is  not diaphoretic.  Psychiatric: Mood and affect normal.  Nursing note and vitals reviewed.     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  Called ER Triage nurse- gave report. Sent copy of colonoscopy with pt   Dr. Mac[facility]  07/20/17

## 2017-07-20 NOTE — ED Notes (Signed)
Ct informed patient is finished with contrast

## 2017-07-20 NOTE — ED Notes (Signed)
Patient transported to CT 

## 2017-07-20 NOTE — ED Provider Notes (Addendum)
[facility]  ____________________________________________   I have reviewed the triage vital signs and the nursing notes.   HISTORY  Chief Complaint Abdominal Pain and Constipation    HPI Regina Macdonald is a 76 y.o. female Who states that she's had constipation for the last 2-3 days. She has had no fever no vomiting. She did have a bowel movement earlier today and then afterwards it "loosened up some ankle. No melena bright red blood per rectum no fever. She has had some crampy abdominal discomfort off and on. Nothing related with food. She is eating and drinking well. She saw her primary care doctor. he/she was apparently concerned about the possibility of appendicitis and sent the patient in for CT scan. Patient has no pain at this time.  the pain is brief, sharp, comes and goes. Usually on the right. Not having it right now. Nothing makes it better and nothing makes it worse that she can think of, history of diverticulitis but no left lower quadrant pain or diverticulitis symptoms Past Medical History:  Diagnosis Date  . Anxiety   . Asthma   . Cancer ([place]) cervical-1973  . COPD (chronic obstructive pulmonary disease) ([place])   . Depression   . Diverticula, colon   . Diverticulosis   . GERD (gastroesophageal reflux disease)   . Sleep apnea     Patient Active Problem List   Diagnosis Date Noted  . Other chest pain 04/16/2015  . COPD exacerbation ([place]) 04/16/2015  . Sleep apnea 04/16/2015  . Diverticulosis of colon without hemorrhage 04/16/2015  . Asthmatic bronchitis 04/16/2015  . 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 ABDOMINAL HYSTERECTOMY    . COLONOSCOPY  06/08/2013   Dr Candace Cruise- small mouth diverticula  . HIP SURGERY Left   . KNEE SURGERY Bilateral   . NASAL RECONSTRUCTION     x 2    Prior to Admission medications   Medication Sig Start Date End  Date Taking? Authorizing Provider  acetaminophen (TYLENOL) 650 MG CR tablet Take 1 tablet by mouth every 8 (eight) hours.    [provider]  aspirin 81 MG chewable tablet Chew 1 tablet by mouth daily.    [provider]  CALCIUM & MAGNESIUM CARBONATES PO Take 1 tablet by mouth daily.    [provider]  cholecalciferol (VITAMIN D) 400 units TABS tablet Take 400 Units by mouth daily.    [provider]  Cyanocobalamin (VITAMIN B-12) 1000 MCG SUBL 1 tablet daily. 08/31/14   [provider]  fexofenadine (ALLEGRA) 180 MG tablet Take 180 mg by mouth daily.    [provider]  FLUoxetine (PROZAC) 10 MG tablet Take 1 tablet (10 mg total) by mouth 2 (two) times daily. Take bid- take with a 20mg = 30 mg in am and take 10mg  by itself at night 03/25/17   Juline Patch, MD  fluticasone (FLONASE) 50 MCG/ACT nasal spray USE TWO SPRAY(S) IN EACH NOSTRIL ONCE DAILY 02/22/17   Juline Patch, MD  Fluticasone Furoate-Vilanterol 100-25 MCG/INH AEPB Inhale 1 spray into the lungs daily. Dr Humphrey Rolls    [provider]  gemfibrozil (LOPID) 600 MG tablet TAKE 1 TABLET BY MOUTH TWICE DAILY 07/16/17   Juline Patch, MD  montelukast (SINGULAIR) 10 MG tablet Take 10 mg by mouth daily. Dr Chancy Milroy    [provider]  Multiple Vitamins-Minerals (CENTRUM SILVER PO) Take 1 capsule by mouth  daily.    [provider]  pantoprazole (PROTONIX) 40 MG tablet Take 1 tablet (40 mg total) by mouth daily. 03/25/17   Juline Patch, MD    Allergies Ciprofloxacin; Etodolac; and Sulfa antibiotics  Family History  Problem Relation Age of Onset  . Cirrhosis Father   . Coronary artery disease Mother   . Breast cancer Neg Hx     Social History Social History  Substance Use Topics  . Smoking status: Never Smoker  . Smokeless tobacco: Never Used  . Alcohol use No    Review of Systems Constitutional: No fever/chills Eyes: No visual changes. ENT: No sore  throat. No stiff neck no neck pain Cardiovascular: Denies chest pain. Respiratory: Denies shortness of breath. Gastrointestinal:   no vomiting.  No diarrhea.  No constipation. Genitourinary: Negative for dysuria. Musculoskeletal: Negative lower extremity swelling Skin: Negative for rash. Neurological: Negative for severe headaches, focal weakness or numbness.   ____________________________________________   PHYSICAL EXAM:  VITAL SIGNS: ED Triage Vitals [07/20/17 1340]  Enc Vitals Group     BP (!) 114/46     Pulse Rate 64     Resp 20     Temp 98.4 F (36.9 C)     Temp Source Oral     SpO2 99 %     Weight 198 lb (89.8 kg)     Height 5\' 3"  (1.6 m)     Head Circumference      Peak Flow      Pain Score 5     Pain Loc      Pain Edu?      Excl. in [place]?     Constitutional: Alert and oriented. Well appearing and in no acute distress. Eyes: Conjunctivae are normal Head: Atraumatic HEENT: No congestion/rhinnorhea. Mucous membranes are moist.  Oropharynx non-erythematous Neck:   Nontender with no meningismus, no masses, no stridor Cardiovascular: Normal rate, regular rhythm. Grossly normal heart sounds.  Good peripheral circulation. Respiratory: Normal respiratory effort.  No retractions. Lungs CTAB. Abdominal: Soft and nontender. No distention. No guarding no rebound Back:  There is no focal tenderness or step off.  there is no midline tenderness there are no lesions noted. there is no CVA tenderness  Musculoskeletal: No lower extremity tenderness, no upper extremity tenderness. No joint effusions, no DVT signs strong distal pulses no edema Neurologic:  Normal speech and language. No gross focal neurologic deficits are appreciated.  Skin:  Skin is warm, dry and intact. No rash noted. Psychiatric: Mood and affect are normal. Speech and behavior are normal.  ____________________________________________   LABS (all labs ordered are listed, but only abnormal results are  displayed)  Labs Reviewed  CBC - Abnormal; Notable for the following:       Result Value   Hemoglobin 11.7 (*)    HCT 34.9 (*)    RDW 14.9 (*)    All other components within normal limits  URINALYSIS, COMPLETE (UACMP) WITH MICROSCOPIC - Abnormal; Notable for the following:    Color, Urine YELLOW (*)    APPearance CLEAR (*)    Leukocytes, UA TRACE (*)    Squamous Epithelial / LPF 0-5 (*)    All other components within normal limits  LIPASE, BLOOD  COMPREHENSIVE METABOLIC PANEL   ____________________________________________  EKG  I personally interpreted any EKGs ordered by me or triage  ____________________________________________  RADIOLOGY  I reviewed any imaging ordered by me or triage that were performed during my shift and, if possible, patient and/or family  made aware of any abnormal findings. ____________________________________________   PROCEDURES  Procedure(s) performed: None  Procedures  Critical Care performed: None  ____________________________________________   INITIAL IMPRESSION / ASSESSMENT AND PLAN / ED COURSE  Pertinent labs & imaging results that were available during my care of the patient were reviewed by me and considered in my medical decision making (see chart for details).  remarkably well-appearing woman with abdominal pain off and on in the conscious of cramping and constipation. Blood work reassuring, family very concerned about possible appendicitis. I have very low suspicion but I did do a CT scan given her age and apparent outpatient concern, and that is reassuring. No evidence of any for disimpaction. Patient forgot about it anyway. She is having bowel movements at home. We'll send her home with stool softeners and outpatient treatment for her constipation with return precautions and follow-up given and understood. Patient looks remarkably well and is eager to go home. Serial abdominal exams betray no evidence of ongoing or any  discomfort, all findings on CT explained the patient she'll follow closely up with PCP    ____________________________________________   FINAL CLINICAL IMPRESSION(S) / ED DIAGNOSES  Final diagnoses:  None      This chart was dictated using voice recognition software.  Despite best efforts to proofread,  errors can occur which can change meaning.      Schuyler Amor, MD 07/20/17 [place]    Schuyler Amor, MD 07/20/17 [phone]

## 2017-07-20 NOTE — ED Triage Notes (Signed)
Pt sent over from PCP for further eval of abd and constipation, has tried OTC laxatives and stool softners without relief.

## 2017-08-16 ENCOUNTER — Ambulatory Visit (INDEPENDENT_AMBULATORY_CARE_PROVIDER_SITE_OTHER): Payer: Medicare HMO

## 2017-08-16 VITALS — BP 98/44 | HR 78 | Temp 98.4°F | Resp 16 | Ht 63.0 in | Wt 192.2 lb

## 2017-08-16 DIAGNOSIS — Z0000 Encounter for general adult medical examination without abnormal findings: Secondary | ICD-10-CM | POA: Diagnosis not present

## 2017-08-16 DIAGNOSIS — Z23 Encounter for immunization: Secondary | ICD-10-CM

## 2017-08-16 NOTE — Progress Notes (Signed)
Subjective:   Regina Macdonald is a 76 y.o. female who presents for Medicare Annual (Subsequent) preventive examination.  Review of Systems:  Cardiac Risk Factors include: advanced age (>41men, >35 women);obesity (BMI >30kg/m2)     Objective:     Vitals: BP (!) 98/44 (BP Location: Left Arm, Patient Position: Sitting)   Pulse 78   Temp 98.4 F (36.9 C) (Oral)   Resp 16   Ht 5\' 3"  (1.6 m)   Wt 192 lb 3.2 oz (87.2 kg)   BMI 34.05 kg/m   Body mass index is 34.05 kg/m.   Tobacco History  Smoking Status  . Never Smoker  Smokeless Tobacco  . Never Used     Counseling given: Not Answered   Past Medical History:  Diagnosis Date  . Anxiety   . Asthma   . 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 COPD (chronic obstructive pulmonary disease) ([place])   . Depression   . Diverticula, colon   . Diverticulosis   . GERD (gastroesophageal reflux disease)   . Sleep apnea    Past Surgical History:  Procedure Laterality Date  . ABDOMINAL HYSTERECTOMY    . COLONOSCOPY  06/08/2013   Regina Regina Macdonald- small mouth diverticula  . HIP SURGERY Left   . KNEE SURGERY Bilateral   . NASAL RECONSTRUCTION     x 2   Family History  Problem Relation Age of Onset  . Cirrhosis Father   . Coronary artery disease Mother   . Breast cancer Neg Hx    History  Sexual Activity  . Sexual activity: No    Outpatient Encounter Prescriptions as of 08/16/2017  Medication Sig  . acetaminophen (TYLENOL) 650 MG CR tablet Take 1 tablet by mouth every 8 (eight) hours.  Marland Kitchen aspirin 81 MG chewable tablet Chew 1 tablet by mouth daily.  Marland Kitchen CALCIUM & MAGNESIUM CARBONATES PO Take 1 tablet by mouth daily.  . cholecalciferol (VITAMIN D) 400 units TABS tablet Take 400 Units by mouth daily.  . Cyanocobalamin (VITAMIN B-12) 1000 MCG SUBL 1 tablet daily.  Marland Kitchen docusate sodium (COLACE) 100 MG capsule Take 1 capsule (100 mg total) by mouth daily as needed.  . fexofenadine (ALLEGRA) 180 MG tablet Take 180 mg by mouth daily.  Marland Kitchen FLUoxetine  (PROZAC) 10 MG tablet Take 1 tablet (10 mg total) by mouth 2 (two) times daily. Take bid- take with a 20mg = 30 mg in am and take 10mg  by itself at night  . fluticasone (FLONASE) 50 MCG/ACT nasal spray USE TWO SPRAY(S) IN EACH NOSTRIL ONCE DAILY  . Fluticasone Furoate-Vilanterol 100-25 MCG/INH AEPB Inhale 1 spray into the lungs daily. Regina Macdonald  . gemfibrozil (LOPID) 600 MG tablet TAKE 1 TABLET BY MOUTH TWICE DAILY  . montelukast (SINGULAIR) 10 MG tablet Take 10 mg by mouth daily. Regina Macdonald  . Multiple Vitamins-Minerals (CENTRUM SILVER PO) Take 1 capsule by mouth daily.  . pantoprazole (PROTONIX) 40 MG tablet Take 1 tablet (40 mg total) by mouth daily.  . polyethylene glycol (MIRALAX) packet Take 17 g by mouth daily.   No facility-administered encounter medications on file as of 08/16/2017.     Activities of Daily Living In your present state of health, do you have any difficulty performing the following activities: 08/16/2017  Hearing? N  Vision? N  Difficulty concentrating or making decisions? Y  Walking or climbing stairs? Y  Comment sob  Dressing or bathing? N  Doing errands, shopping? N  Preparing Food and eating ? N  Using the  Toilet? N  In the past six months, have you accidently leaked urine? N  Do you have problems with loss of bowel control? N  Managing your Medications? N  Managing your Finances? N  Housekeeping or managing your Housekeeping? N  Some recent data might be hidden    Patient Care Team: Regina Patch, MD as PCP - General (Family Medicine) Regina Gee, MD as Consulting Physician (Internal Medicine)    Assessment:     Exercise Activities and Dietary recommendations    Goals    . Prevent Falls          Fall prevention discussed      Fall Risk Fall Risk  08/16/2017 03/05/2017 02/25/2016 01/15/2016 11/20/2015  Falls in the past year? Yes Yes No No No  Number falls in past yr: 2 or more 1 - - -  Injury with Fall? No No - - -  Follow up Falls prevention  discussed Falls evaluation completed - - -   Depression Screen PHQ 2/9 Scores 08/16/2017 03/05/2017 02/25/2016 01/15/2016  PHQ - 2 Score 0 0 0 0     Cognitive Function     6CIT Screen 08/16/2017  What Year? 0 points  What month? 0 points  What time? 0 points  Count back from 20 0 points  Months in reverse 0 points  Repeat phrase 2 points  Total Score 2    Immunization History  Administered Date(s) Administered  . Influenza, High Dose Seasonal PF 08/16/2017  . Influenza,inj,Quad PF,6+ Mos 09/09/2015  . Influenza-Unspecified 07/24/2014, 07/29/2016  . Pneumococcal Conjugate-13 09/09/2015  . Pneumococcal Polysaccharide-23 02/09/2013  . Tdap 08/10/2011  . Zoster 02/27/2015   Screening Tests Health Maintenance  Topic Date Due  . TETANUS/TDAP  08/09/2021  . INFLUENZA VACCINE  Completed  . DEXA SCAN  Completed  . PNA vac Low Risk Adult  Completed      Plan:     I have personally reviewed and addressed the Medicare Annual Wellness questionnaire and have noted the following in the patient's chart:  A. Medical and social history B. Use of alcohol, tobacco or illicit drugs  C. Current medications and supplements D. Functional ability and status E.  Nutritional status F.  Physical activity G. Advance directives H. List of other physicians I.  Hospitalizations, surgeries, and ER visits in previous 12 months J.  [place] such as hearing and vision if needed, cognitive and depression L. Referrals and appointments   In addition, I have reviewed and discussed with patient certain preventive protocols, quality metrics, and best practice recommendations. A written personalized care plan for preventive services as well as general preventive health recommendations were provided to patient.   Signed,  Regina Aas, LPN Nurse Health Advisor   MD Recommendations: As discussed, Regina Macdonald's BP at todays visit is 98/44. Patient does not take any medications for HTN,she states  she has only had coffee this morning. She was advised to drink plenty of fluids today and call with any dizziness,blurred vision and/or nausea, otherwise she will follow up within 1-2 weeks.

## 2017-08-16 NOTE — Patient Instructions (Addendum)
Regina Macdonald , Thank you for taking time to come for your Medicare Wellness Visit. I appreciate your ongoing commitment to your health goals. Please review the following plan we discussed and let me know if I can assist you in the future.   Screening recommendations/referrals: Colonoscopy: completed 06/08/2013 Mammogram: no longer required Bone Density: completed 03/19/2015 Recommended yearly ophthalmology/optometry visit for glaucoma screening and checkup Recommended yearly dental visit for hygiene and checkup  Vaccinations: Influenza vaccine: done today  Pneumococcal vaccine: up to date Tdap vaccine: up to date Shingles vaccine: up to date    Advanced directives: Please bring a copy of your health care power of attorney and living will to the office at your convenience.  Conditions/risks identified: Fall prevention discussed, BP low- spoke with Dr.Jones- possible dehydration, drink plenty of fluids. Follow up with Dr.Jones in 1-2 weeks.   Next appointment: Follow up in one year for your annual wellness exam.    Preventive Care 65 Years and Older, Female Preventive care refers to lifestyle choices and visits with your health care provider that can promote health and wellness. What does preventive care include?  A yearly physical exam. This is also called an annual well check.  Dental exams once or twice a year.  Routine eye exams. Ask your health care provider how often you should have your eyes checked.  Personal lifestyle choices, including:  Daily care of your teeth and gums.  Regular physical activity.  Eating a healthy diet.  Avoiding tobacco and drug use.  Limiting alcohol use.  Practicing safe sex.  Taking low-dose aspirin every day.  Taking vitamin and mineral supplements as recommended by your health care provider. What happens during an annual well check? The services and screenings done by your health care provider during your annual well check will depend on  your age, overall health, lifestyle risk factors, and family history of disease. Counseling  Your health care provider may ask you questions about your:  Alcohol use.  Tobacco use.  Drug use.  Emotional well-being.  Home and relationship well-being.  Sexual activity.  Eating habits.  History of falls.  Memory and ability to understand (cognition).  Work and work Statistician.  Reproductive health. Screening  You may have the following tests or measurements:  Height, weight, and BMI.  Blood pressure.  Lipid and cholesterol levels. These may be checked every 5 years, or more frequently if you are over 24 years old.  Skin check.  Lung cancer screening. You may have this screening every year starting at age 50 if you have a 30-pack-year history of smoking and currently smoke or have quit within the past 15 years.  Fecal occult blood test (FOBT) of the stool. You may have this test every year starting at age 45.  Flexible sigmoidoscopy or colonoscopy. You may have a sigmoidoscopy every 5 years or a colonoscopy every 10 years starting at age 32.  Hepatitis C blood test.  Hepatitis B blood test.  Sexually transmitted disease (STD) testing.  Diabetes screening. This is done by checking your blood sugar (glucose) after you have not eaten for a while (fasting). You may have this done every 1-3 years.  Bone density scan. This is done to screen for osteoporosis. You may have this done starting at age 53.  Mammogram. This may be done every 1-2 years. Talk to your health care provider about how often you should have regular mammograms. Talk with your health care provider about your test results, treatment options, and  if necessary, the need for more tests. Vaccines  Your health care provider may recommend certain vaccines, such as:  Influenza vaccine. This is recommended every year.  Tetanus, diphtheria, and acellular pertussis (Tdap, Td) vaccine. You may need a Td booster  every 10 years.  Zoster vaccine. You may need this after age 68.  Pneumococcal 13-valent conjugate (PCV13) vaccine. One dose is recommended after age 71.  Pneumococcal polysaccharide (PPSV23) vaccine. One dose is recommended after age 26. Talk to your health care provider about which screenings and vaccines you need and how often you need them. This information is not intended to replace advice given to you by your health care provider. Make sure you discuss any questions you have with your health care provider. Document Released: 11/29/2015 Document Revised: 07/22/2016 Document Reviewed: 09/03/2015 Elsevier Interactive Patient Education  2017 [place] Prevention in the Home Falls can cause injuries. They can happen to people of all ages. There are many things you can do to make your home safe and to help prevent falls. What can I do on the outside of my home?  Regularly fix the edges of walkways and driveways and fix any cracks.  Remove anything that might make you trip as you walk through a door, such as a raised step or threshold.  Trim any bushes or trees on the path to your home.  Use bright outdoor lighting.  Clear any walking paths of anything that might make someone trip, such as rocks or tools.  Regularly check to see if handrails are loose or broken. Make sure that both sides of any steps have handrails.  Any raised decks and porches should have guardrails on the edges.  Have any leaves, snow, or ice cleared regularly.  Use sand or salt on walking paths during winter.  Clean up any spills in your garage right away. This includes oil or grease spills. What can I do in the bathroom?  Use night lights.  Install grab bars by the toilet and in the tub and shower. Do not use towel bars as grab bars.  Use non-skid mats or decals in the tub or shower.  If you need to sit down in the shower, use a plastic, non-slip stool.  Keep the floor dry. Clean up any  water that spills on the floor as soon as it happens.  Remove soap buildup in the tub or shower regularly.  Attach bath mats securely with double-sided non-slip rug tape.  Do not have throw rugs and other things on the floor that can make you trip. What can I do in the bedroom?  Use night lights.  Make sure that you have a light by your bed that is easy to reach.  Do not use any sheets or blankets that are too big for your bed. They should not hang down onto the floor.  Have a firm chair that has side arms. You can use this for support while you get dressed.  Do not have throw rugs and other things on the floor that can make you trip. What can I do in the kitchen?  Clean up any spills right away.  Avoid walking on wet floors.  Keep items that you use a lot in easy-to-reach places.  If you need to reach something above you, use a strong step stool that has a grab bar.  Keep electrical cords out of the way.  Do not use floor polish or wax that makes floors slippery. If you  must use wax, use non-skid floor wax.  Do not have throw rugs and other things on the floor that can make you trip. What can I do with my stairs?  Do not leave any items on the stairs.  Make sure that there are handrails on both sides of the stairs and use them. Fix handrails that are broken or loose. Make sure that handrails are as long as the stairways.  Check any carpeting to make sure that it is firmly attached to the stairs. Fix any carpet that is loose or worn.  Avoid having throw rugs at the top or bottom of the stairs. If you do have throw rugs, attach them to the floor with carpet tape.  Make sure that you have a light switch at the top of the stairs and the bottom of the stairs. If you do not have them, ask someone to add them for you. What else can I do to help prevent falls?  Wear shoes that:  Do not have high heels.  Have rubber bottoms.  Are comfortable and fit you well.  Are closed  at the toe. Do not wear sandals.  If you use a stepladder:  Make sure that it is fully opened. Do not climb a closed stepladder.  Make sure that both sides of the stepladder are locked into place.  Ask someone to hold it for you, if possible.  Clearly mark and make sure that you can see:  Any grab bars or handrails.  First and last steps.  Where the edge of each step is.  Use tools that help you move around (mobility aids) if they are needed. These include:  Canes.  Walkers.  Scooters.  Crutches.  Turn on the lights when you go into a dark area. Replace any light bulbs as soon as they burn out.  Set up your furniture so you have a clear path. Avoid moving your furniture around.  If any of your floors are uneven, fix them.  If there are any pets around you, be aware of where they are.  Review your medicines with your doctor. Some medicines can make you feel dizzy. This can increase your chance of falling. Ask your doctor what other things that you can do to help prevent falls. This information is not intended to replace advice given to you by your health care provider. Make sure you discuss any questions you have with your health care provider. Document Released: 08/29/2009 Document Revised: 04/09/2016 Document Reviewed: 12/07/2014 Elsevier Interactive Patient Education  2017 [employer].

## 2017-08-19 ENCOUNTER — Encounter: Payer: Self-pay | Admitting: Family Medicine

## 2017-08-19 ENCOUNTER — Ambulatory Visit (INDEPENDENT_AMBULATORY_CARE_PROVIDER_SITE_OTHER): Payer: Medicare HMO | Admitting: Family Medicine

## 2017-08-19 ENCOUNTER — Other Ambulatory Visit: Payer: Self-pay | Admitting: Family Medicine

## 2017-08-19 VITALS — BP 128/70 | HR 68 | Ht 63.0 in | Wt 192.0 lb

## 2017-08-19 DIAGNOSIS — J441 Chronic obstructive pulmonary disease with (acute) exacerbation: Secondary | ICD-10-CM | POA: Diagnosis not present

## 2017-08-19 DIAGNOSIS — F419 Anxiety disorder, unspecified: Secondary | ICD-10-CM

## 2017-08-19 DIAGNOSIS — F418 Other specified anxiety disorders: Secondary | ICD-10-CM | POA: Insufficient documentation

## 2017-08-19 DIAGNOSIS — K5909 Other constipation: Secondary | ICD-10-CM

## 2017-08-19 DIAGNOSIS — E785 Hyperlipidemia, unspecified: Secondary | ICD-10-CM | POA: Insufficient documentation

## 2017-08-19 DIAGNOSIS — K219 Gastro-esophageal reflux disease without esophagitis: Secondary | ICD-10-CM | POA: Insufficient documentation

## 2017-08-19 DIAGNOSIS — R69 Illness, unspecified: Secondary | ICD-10-CM

## 2017-08-19 MED ORDER — FLUOXETINE HCL 10 MG PO TABS: 10.0000 mg | ORAL_TABLET | Freq: Two times a day (BID) | ORAL | 5 refills | Status: DC

## 2017-08-19 MED ORDER — GEMFIBROZIL 600 MG PO TABS: 600.0000 mg | ORAL_TABLET | Freq: Two times a day (BID) | ORAL | 5 refills | Status: DC

## 2017-08-19 MED ORDER — PANTOPRAZOLE SODIUM 40 MG PO TBEC: 40.0000 mg | DELAYED_RELEASE_TABLET | Freq: Every day | ORAL | 5 refills | Status: DC

## 2017-08-19 MED ORDER — DOCUSATE SODIUM 100 MG PO CAPS: 100.0000 mg | ORAL_CAPSULE | Freq: Every day | ORAL | 5 refills | Status: DC | PRN

## 2017-08-19 MED ORDER — FLUOXETINE HCL 20 MG PO TABS: 20.0000 mg | ORAL_TABLET | Freq: Every day | ORAL | 5 refills | Status: DC

## 2017-08-19 NOTE — Progress Notes (Signed)
Name: Regina Macdonald   MRN: [id number]    DOB: 1941/11/01   Date:08/19/2017       Progress Note  Subjective  Chief Complaint  Chief Complaint  Patient presents with  . Hypertension  . Hyperlipidemia  . Gastroesophageal Reflux  . Anxiety  . Depression    Hypertension  This is a chronic problem. The current episode started more than 1 year ago. The problem is unchanged. The problem is controlled. Associated symptoms include anxiety. Pertinent negatives include no blurred vision, chest pain, headaches, malaise/fatigue, neck pain, orthopnea, palpitations, peripheral edema, PND, shortness of breath or sweats. There are no associated agents to hypertension. Risk factors for coronary artery disease include dyslipidemia and obesity. Past treatments include lifestyle changes. The current treatment provides mild improvement. There are no compliance problems.  There is no history of angina, kidney disease, CAD/MI, CVA, heart failure, left ventricular hypertrophy, PVD or retinopathy. There is no history of chronic renal disease, a hypertension causing med or renovascular disease.  Hyperlipidemia  This is a chronic problem. The current episode started more than 1 year ago. The problem is controlled. Recent lipid tests were reviewed and are normal. Exacerbating diseases include obesity. She has no history of chronic renal disease, diabetes, hypothyroidism, liver disease or nephrotic syndrome. There are no known factors aggravating her hyperlipidemia. Pertinent negatives include no chest pain, focal sensory loss, focal weakness, leg pain, myalgias or shortness of breath. Current antihyperlipidemic treatment includes fibric acid derivatives. The current treatment provides moderate improvement of lipids. There are no compliance problems.  Risk factors for coronary artery disease include hypertension and post-menopausal.  Gastroesophageal Reflux  She reports no abdominal pain, no chest pain, no coughing, no  dysphagia, no heartburn, no nausea, no sore throat, no stridor or no wheezing. This is a chronic problem. The problem has been unchanged. The symptoms are aggravated by certain foods. Pertinent negatives include no anemia, melena, muscle weakness or weight loss. Risk factors include obesity. She has tried a PPI for the symptoms. The treatment provided mild relief.  Anxiety  Presents for follow-up visit. Symptoms include nervous/anxious behavior. Patient reports no chest pain, decreased concentration, depressed mood, dizziness, excessive worry, hyperventilation, insomnia, irritability, nausea, palpitations, panic, shortness of breath or suicidal ideas. Symptoms occur occasionally. The severity of symptoms is mild.    Depression         This is a new problem.  The current episode started more than 1 year ago.   The onset quality is gradual.   The problem occurs intermittently.  The problem has been gradually improving since onset.  Associated symptoms include no decreased concentration, no helplessness, no hopelessness, does not have insomnia, not irritable, no decreased interest, no myalgias, no headaches, no indigestion, not sad and no suicidal ideas.  Past treatments include SSRIs - Selective serotonin reuptake inhibitors.  Compliance with treatment is good.  Previous treatment provided mild relief.  Past medical history includes anxiety.     Pertinent negatives include no hypothyroidism.   No problem-specific Assessment & Plan notes found for this encounter.   Past Medical History:  Diagnosis Date  . Anxiety   . Asthma   . Cancer ([place]) cervical-1973  . COPD (chronic obstructive pulmonary disease) ([place])   . Depression   . Diverticula, colon   . Diverticulosis   . GERD (gastroesophageal reflux disease)   . Sleep apnea     Past Surgical History:  Procedure Laterality Date  . ABDOMINAL HYSTERECTOMY    . COLONOSCOPY  06/08/2013   Dr Candace Cruise- small mouth diverticula  . HIP SURGERY Left   . KNEE  SURGERY Bilateral   . NASAL RECONSTRUCTION     x 2    Family History  Problem Relation Age of Onset  . Cirrhosis Father   . Coronary artery disease Mother   . Breast cancer Neg Hx     Social History   Social History  . Marital status: Divorced    Spouse name: N/A  . Number of children: N/A  . Years of education: N/A   Occupational History  . Not on file.   Social History Main Topics  . Smoking status: Never Smoker  . Smokeless tobacco: Never Used  . Alcohol use No  . Drug use: No  . Sexual activity: No   Other Topics Concern  . Not on file   Social History Narrative  . No narrative on file    Allergies  Allergen Reactions  . Ciprofloxacin Other (See Comments)  . Etodolac Other (See Comments)  . Sulfa Antibiotics Rash    Outpatient Medications Prior to Visit  Medication Sig Dispense Refill  . acetaminophen (TYLENOL) 650 MG CR tablet Take 1 tablet by mouth every 8 (eight) hours.    Marland Kitchen aspirin 81 MG chewable tablet Chew 1 tablet by mouth daily.    Marland Kitchen CALCIUM & MAGNESIUM CARBONATES PO Take 1 tablet by mouth daily.    . cholecalciferol (VITAMIN D) 400 units TABS tablet Take 400 Units by mouth daily.    . Cyanocobalamin (VITAMIN B-12) 1000 MCG SUBL 1 tablet daily.    . fexofenadine (ALLEGRA) 180 MG tablet Take 180 mg by mouth daily.    . fluticasone (FLONASE) 50 MCG/ACT nasal spray USE TWO SPRAY(S) IN EACH NOSTRIL ONCE DAILY 16 g 11  . Fluticasone Furoate-Vilanterol 100-25 MCG/INH AEPB Inhale 1 spray into the lungs daily. Dr Humphrey Rolls    . montelukast (SINGULAIR) 10 MG tablet Take 10 mg by mouth daily. Dr Chancy Milroy    . Multiple Vitamins-Minerals (CENTRUM SILVER PO) Take 1 capsule by mouth daily.    . polyethylene glycol (MIRALAX) packet Take 17 g by mouth daily. 14 each 0  . docusate sodium (COLACE) 100 MG capsule Take 1 capsule (100 mg total) by mouth daily as needed. 30 capsule 2  . FLUoxetine (PROZAC) 10 MG tablet Take 1 tablet (10 mg total) by mouth 2 (two) times daily.  Take bid- take with a 20mg = 30 mg in am and take 10mg  by itself at night 60 tablet 3  . gemfibrozil (LOPID) 600 MG tablet TAKE 1 TABLET BY MOUTH TWICE DAILY 60 tablet 0  . pantoprazole (PROTONIX) 40 MG tablet Take 1 tablet (40 mg total) by mouth daily. 30 tablet 3   No facility-administered medications prior to visit.     Review of Systems  Constitutional: Negative for chills, fever, irritability, malaise/fatigue and weight loss.  HENT: Negative for ear discharge, ear pain and sore throat.   Eyes: Negative for blurred vision.  Respiratory: Negative for cough, sputum production, shortness of breath and wheezing.   Cardiovascular: Negative for chest pain, palpitations, orthopnea, leg swelling and PND.  Gastrointestinal: Negative for abdominal pain, blood in stool, constipation, diarrhea, dysphagia, heartburn, melena and nausea.  Genitourinary: Negative for dysuria, frequency, hematuria and urgency.  Musculoskeletal: Negative for back pain, joint pain, myalgias, muscle weakness and neck pain.  Skin: Negative for rash.  Neurological: Negative for dizziness, tingling, sensory change, focal weakness and headaches.  Endo/Heme/Allergies: Negative for environmental allergies  and polydipsia. Does not bruise/bleed easily.  Psychiatric/Behavioral: Positive for depression. Negative for decreased concentration and suicidal ideas. The patient is nervous/anxious. The patient does not have insomnia.      Objective  Vitals:   08/19/17 0931  BP: 128/70  Pulse: 68  Weight: 192 lb (87.1 kg)  Height: 5\' 3"  (1.6 m)    Physical Exam  Constitutional: She is well-developed, well-nourished, and in no distress. She is not irritable. No distress.  HENT:  Head: Normocephalic and atraumatic.  Right Ear: External ear normal.  Left Ear: External ear normal.  Nose: Nose normal.  Mouth/Throat: Oropharynx is clear and moist.  Eyes: Pupils are equal, round, and reactive to light. Conjunctivae and EOM are normal.  Right eye exhibits no discharge. Left eye exhibits no discharge.  Neck: Normal range of motion. Neck supple. No JVD present. No thyromegaly present.  Cardiovascular: Normal rate, regular rhythm, normal heart sounds and intact distal pulses.  Exam reveals no gallop and no friction rub.   No murmur heard. Pulmonary/Chest: Effort normal and breath sounds normal. She has no wheezes. She has no rales.  Abdominal: Soft. Bowel sounds are normal. She exhibits no mass. There is no tenderness. There is no guarding.  Musculoskeletal: Normal range of motion. She exhibits no edema.  Lymphadenopathy:    She has no cervical adenopathy.  Neurological: She is alert. She has normal reflexes.  Skin: Skin is warm and dry. She is not diaphoretic.  Psychiatric: Mood and affect normal.  Nursing note and vitals reviewed.     Assessment & Plan  Problem List Items Addressed This Visit      Respiratory   COPD exacerbation ([place])     Digestive   Gastroesophageal reflux disease without esophagitis   Relevant Medications   pantoprazole (PROTONIX) 40 MG tablet   docusate sodium (COLACE) 100 MG capsule     Other   Depression with anxiety - Primary   Relevant Medications   FLUoxetine (PROZAC) 10 MG tablet   FLUoxetine (PROZAC) 20 MG tablet   Hyperlipidemia   Relevant Medications   gemfibrozil (LOPID) 600 MG tablet   Other Relevant Orders   Renal Function Panel   Lipid Profile    Other Visit Diagnoses    Other constipation       Relevant Medications   docusate sodium (COLACE) 100 MG capsule   Taking medication for chronic disease       Relevant Orders   Renal Function Panel   Hepatic function panel      Meds ordered this encounter  Medications  . FLUoxetine (PROZAC) 10 MG tablet    Sig: Take 1 tablet (10 mg total) by mouth 2 (two) times daily. Take bid- take with a 20mg = 30 mg in am and take 10mg  by itself at night    Dispense:  60 tablet    Refill:  5  . FLUoxetine (PROZAC) 20 MG tablet     Sig: Take 1 tablet (20 mg total) by mouth daily.    Dispense:  30 tablet    Refill:  5  . gemfibrozil (LOPID) 600 MG tablet    Sig: Take 1 tablet (600 mg total) by mouth 2 (two) times daily.    Dispense:  60 tablet    Refill:  5    Pt only wants 30 days  . pantoprazole (PROTONIX) 40 MG tablet    Sig: Take 1 tablet (40 mg total) by mouth daily.    Dispense:  30 tablet  Refill:  5  . docusate sodium (COLACE) 100 MG capsule    Sig: Take 1 capsule (100 mg total) by mouth daily as needed.    Dispense:  30 capsule    Refill:  5      Dr. Otilio Miu [facility]  08/19/17

## 2017-08-20 LAB — RENAL FUNCTION PANEL
Albumin: 4.5 g/dL (ref 3.5–4.8)
BUN / CREAT RATIO: 19 (ref 12–28)
BUN: 13 mg/dL (ref 8–27)
CHLORIDE: 104 mmol/L (ref 96–106)
CO2: 22 mmol/L (ref 20–29)
Calcium: 9.1 mg/dL (ref 8.7–10.3)
Creatinine, Ser: 0.69 mg/dL (ref 0.57–1.00)
GFR calc non Af Amer: 84 mL/min/{1.73_m2} (ref >59)
GFR, EST AFRICAN AMERICAN: 96 mL/min/{1.73_m2} (ref >59)
GLUCOSE: 73 mg/dL (ref 65–99)
Phosphorus: 2.8 mg/dL (ref 2.5–4.5)
Potassium: 4.2 mmol/L (ref 3.5–5.2)
SODIUM: 142 mmol/L (ref 134–144)

## 2017-08-20 LAB — LIPID PANEL
Chol/HDL Ratio: 3 ratio (ref 0.0–4.4)
Cholesterol, Total: 131 mg/dL (ref 100–199)
HDL: 43 mg/dL (ref >39)
LDL Calculated: 67 mg/dL (ref 0–99)
Triglycerides: 106 mg/dL (ref 0–149)
VLDL Cholesterol Cal: 21 mg/dL (ref 5–40)

## 2017-08-20 LAB — HEPATIC FUNCTION PANEL
ALK PHOS: 111 IU/L (ref 39–117)
ALT: 16 IU/L (ref 0–32)
AST: 26 IU/L (ref 0–40)
BILIRUBIN TOTAL: 0.2 mg/dL (ref 0.0–1.2)
Bilirubin, Direct: 0.1 mg/dL (ref 0.00–0.40)
TOTAL PROTEIN: 6.8 g/dL (ref 6.0–8.5)

## 2017-10-06 DIAGNOSIS — G4733 Obstructive sleep apnea (adult) (pediatric): Secondary | ICD-10-CM | POA: Diagnosis not present

## 2017-11-02 ENCOUNTER — Other Ambulatory Visit: Payer: Self-pay

## 2017-11-02 MED ORDER — FLUTICASONE FUROATE-VILANTEROL 100-25 MCG/INH IN AEPB: 1.0000 | INHALATION_SPRAY | Freq: Every day | RESPIRATORY_TRACT | 3 refills | Status: DC

## 2017-11-18 ENCOUNTER — Ambulatory Visit: Payer: Self-pay | Admitting: Internal Medicine

## 2017-12-01 ENCOUNTER — Other Ambulatory Visit: Payer: Self-pay

## 2017-12-01 ENCOUNTER — Telehealth: Payer: Self-pay

## 2017-12-01 DIAGNOSIS — F418 Other specified anxiety disorders: Secondary | ICD-10-CM

## 2017-12-01 DIAGNOSIS — F419 Anxiety disorder, unspecified: Secondary | ICD-10-CM

## 2017-12-01 MED ORDER — FLUOXETINE HCL 20 MG PO TABS: 20.0000 mg | ORAL_TABLET | Freq: Two times a day (BID) | ORAL | 0 refills | Status: DC

## 2017-12-01 NOTE — Telephone Encounter (Signed)
pt called in saying that her insurance is not going to pay for the 10mg  tabs anymore. Therefore, her med was changed to Prozac 20mg  BID and sent into [facility]. Pt was told insurance may not pay for BID dosing

## 2017-12-02 ENCOUNTER — Other Ambulatory Visit: Payer: Self-pay

## 2017-12-10 ENCOUNTER — Other Ambulatory Visit: Payer: Self-pay

## 2017-12-13 ENCOUNTER — Ambulatory Visit (INDEPENDENT_AMBULATORY_CARE_PROVIDER_SITE_OTHER): Payer: Medicare HMO | Admitting: Family Medicine

## 2017-12-13 ENCOUNTER — Encounter: Payer: Self-pay | Admitting: Family Medicine

## 2017-12-13 VITALS — BP 120/80 | HR 72 | Ht 63.0 in | Wt 192.0 lb

## 2017-12-13 DIAGNOSIS — K5909 Other constipation: Secondary | ICD-10-CM | POA: Insufficient documentation

## 2017-12-13 DIAGNOSIS — K5730 Diverticulosis of large intestine without perforation or abscess without bleeding: Secondary | ICD-10-CM

## 2017-12-13 DIAGNOSIS — K625 Hemorrhage of anus and rectum: Secondary | ICD-10-CM

## 2017-12-13 LAB — HEMOCCULT GUIAC POC 1CARD (OFFICE): Fecal Occult Blood, POC: NEGATIVE

## 2017-12-13 MED ORDER — DOCUSATE SODIUM 100 MG PO CAPS
100.0000 mg | ORAL_CAPSULE | Freq: Two times a day (BID) | ORAL | 5 refills | Status: DC
Start: 2017-12-13 — End: 2018-07-11

## 2017-12-13 NOTE — Patient Instructions (Signed)
High-Fiber Diet  Fiber, also called dietary fiber, is a type of carbohydrate found in fruits, vegetables, whole grains, and beans. A high-fiber diet can have many health benefits. Your health care provider may recommend a high-fiber diet to help:  · Prevent constipation. Fiber can make your bowel movements more regular.  · Lower your cholesterol.  · Relieve hemorrhoids, uncomplicated diverticulosis, or irritable bowel syndrome.  · Prevent overeating as part of a weight-loss plan.  · Prevent heart disease, type 2 diabetes, and certain cancers.    What is my plan?  The recommended daily intake of fiber includes:  · 38 grams for men under age 50.  · 30 grams for men over age 50.  · 25 grams for women under age 50.  · 21 grams for women over age 50.    You can get the recommended daily intake of dietary fiber by eating a variety of fruits, vegetables, grains, and beans. Your health care provider may also recommend a fiber supplement if it is not possible to get enough fiber through your diet.  What do I need to know about a high-fiber diet?  · Fiber supplements have not been widely studied for their effectiveness, so it is better to get fiber through food sources.  · Always check the fiber content on the nutrition facts label of any prepackaged food. Look for foods that contain at least 5 grams of fiber per serving.  · Ask your dietitian if you have questions about specific foods that are related to your condition, especially if those foods are not listed in the following section.  · Increase your daily fiber consumption gradually. Increasing your intake of dietary fiber too quickly may cause bloating, cramping, or gas.  · Drink plenty of water. Water helps you to digest fiber.  What foods can I eat?  Grains  Whole-grain breads. Multigrain cereal. Oats and oatmeal. Brown rice. Barley. Bulgur wheat. Millet. Bran muffins. Popcorn. Rye wafer crackers.  Vegetables   Sweet potatoes. Spinach. Kale. Artichokes. Cabbage. Broccoli. Green peas. Carrots. Squash.  Fruits  Berries. Pears. Apples. Oranges. Avocados. Prunes and raisins. Dried figs.  Meats and Other Protein Sources  [place]y, kidney, pinto, and soy beans. Split peas. Lentils. Nuts and seeds.  Dairy  Fiber-fortified yogurt.  Beverages  Fiber-fortified soy milk. Fiber-fortified orange juice.  Other  Fiber bars.  The items listed above may not be a complete list of recommended foods or beverages. Contact your dietitian for more options.  What foods are not recommended?  Grains  White bread. Pasta made with refined flour. White rice.  Vegetables  Fried potatoes. Canned vegetables. Well-cooked vegetables.  Fruits  Fruit juice. Cooked, strained fruit.  Meats and Other Protein Sources  Fatty cuts of meat. Fried poultry or fried fish.  Dairy  Milk. Yogurt. Cream cheese. Sour cream.  Beverages  Soft drinks.  Other  Cakes and pastries. Butter and oils.  The items listed above may not be a complete list of foods and beverages to avoid. Contact your dietitian for more information.  What are some tips for including high-fiber foods in my diet?  · Eat a wide variety of high-fiber foods.  · Make sure that half of all grains consumed each day are whole grains.  · Replace breads and cereals made from refined flour or white flour with whole-grain breads and cereals.  · Replace white rice with brown rice, bulgur wheat, or millet.  · Start the day with a breakfast that is high in fiber,   such as a cereal that contains at least 5 grams of fiber per serving.  · Use beans in place of meat in soups, salads, or pasta.  · Eat high-fiber snacks, such as berries, raw vegetables, nuts, or popcorn.  This information is not intended to replace advice given to you by your health care provider. Make sure you discuss any questions you have with your health care provider.  Document Released: 11/02/2005 Document Revised: 04/09/2016 Document Reviewed: 04/17/2014   Elsevier Interactive Patient Education © 2018 [employer]

## 2017-12-13 NOTE — Progress Notes (Signed)
Name: Regina Macdonald   MRN: [id number]    DOB: 05-16-1941   Date:12/13/2017       Progress Note  Subjective  Chief Complaint  Chief Complaint  Patient presents with  . Rectal Bleeding    don't see blood in stool, has on toilet paper when wipes. Has internal hemorrhoids    Rectal Bleeding   The current episode started 3 to 5 days ago. The onset was gradual. The problem occurs occasionally. The problem has been gradually improving. The pain is moderate ("when I have to strain"). The stool is described as hard. Prior successful therapies include stool softeners. There was no prior unsuccessful therapy. Associated symptoms include hemorrhoids and rectal pain. Pertinent negatives include no anorexia, no fever, no abdominal pain, no diarrhea, no hematemesis, no nausea, no vomiting, no hematuria, no vaginal bleeding, no vaginal discharge, no chest pain, no headaches, no coughing and no rash.  Constipation  This is a chronic ("all my life") problem. The problem has been waxing and waning since onset. The stool is described as firm. The patient is not on a high fiber diet. She does not exercise regularly. There has not been adequate water intake. Associated symptoms include hematochezia, hemorrhoids and rectal pain. Pertinent negatives include no abdominal pain, anorexia, back pain, bloating, diarrhea, difficulty urinating, fecal incontinence, fever, flatus, melena, nausea, vomiting or weight loss.    No problem-specific Assessment & Plan notes found for this encounter.   Past Medical History:  Diagnosis Date  . Anxiety   . Asthma   . Cancer ([place]) cervical-1973  . COPD (chronic obstructive pulmonary disease) ([place])   . Depression   . Diverticula, colon   . Diverticulosis   . GERD (gastroesophageal reflux disease)   . Sleep apnea     Past Surgical History:  Procedure Laterality Date  . ABDOMINAL HYSTERECTOMY    . COLONOSCOPY  06/08/2013   Dr Candace Cruise- small mouth diverticula  . HIP SURGERY Left    . KNEE SURGERY Bilateral   . NASAL RECONSTRUCTION     x 2    Family History  Problem Relation Age of Onset  . Cirrhosis Father   . Coronary artery disease Mother   . Breast cancer Neg Hx     Social History   Socioeconomic History  . Marital status: Divorced    Spouse name: Not on file  . Number of children: Not on file  . Years of education: Not on file  . Highest education level: Not on file  Social Needs  . Financial resource strain: Not on file  . Food insecurity - worry: Not on file  . Food insecurity - inability: Not on file  . Transportation needs - medical: Not on file  . Transportation needs - non-medical: Not on file  Occupational History  . Not on file  Tobacco Use  . Smoking status: Never Smoker  . Smokeless tobacco: Never Used  Substance and Sexual Activity  . Alcohol use: No    Alcohol/week: 0.0 oz  . Drug use: No  . Sexual activity: No    Birth control/protection: Post-menopausal  Other Topics Concern  . Not on file  Social History Narrative  . Not on file    Allergies  Allergen Reactions  . Ciprofloxacin Other (See Comments)  . Etodolac Other (See Comments)  . Sulfa Antibiotics Rash    Outpatient Medications Prior to Visit  Medication Sig Dispense Refill  . acetaminophen (TYLENOL) 650 MG CR tablet Take 1 tablet by mouth  every 8 (eight) hours.    Marland Kitchen aspirin 81 MG chewable tablet Chew 1 tablet by mouth daily.    Marland Kitchen CALCIUM & MAGNESIUM CARBONATES PO Take 1 tablet by mouth daily.    . cholecalciferol (VITAMIN D) 400 units TABS tablet Take 400 Units by mouth daily.    . Cyanocobalamin (VITAMIN B-12) 1000 MCG SUBL 1 tablet daily.    . fexofenadine (ALLEGRA) 180 MG tablet Take 180 mg by mouth daily.    Marland Kitchen FLUoxetine (PROZAC) 20 MG tablet Take 1 tablet (20 mg total) by mouth 2 (two) times daily. 60 tablet 0  . fluticasone (FLONASE) 50 MCG/ACT nasal spray USE TWO SPRAY(S) IN EACH NOSTRIL ONCE DAILY 16 g 11  . fluticasone furoate-vilanterol (BREO  ELLIPTA) 100-25 MCG/INH AEPB Inhale 1 puff into the lungs daily. Dr Humphrey Rolls 60 each 3  . gemfibrozil (LOPID) 600 MG tablet Take 1 tablet (600 mg total) by mouth 2 (two) times daily. 60 tablet 5  . montelukast (SINGULAIR) 10 MG tablet Take 10 mg by mouth daily. Dr Chancy Milroy    . Multiple Vitamins-Minerals (CENTRUM SILVER PO) Take 1 capsule by mouth daily.    . pantoprazole (PROTONIX) 40 MG tablet Take 1 tablet (40 mg total) by mouth daily. 30 tablet 5  . polyethylene glycol (MIRALAX) packet Take 17 g by mouth daily. 14 each 0  . docusate sodium (COLACE) 100 MG capsule Take 1 capsule (100 mg total) by mouth daily as needed. 30 capsule 5   No facility-administered medications prior to visit.     Review of Systems  Constitutional: Negative for chills, fever, malaise/fatigue and weight loss.  HENT: Negative for ear discharge, ear pain and sore throat.   Eyes: Negative for blurred vision.  Respiratory: Negative for cough, sputum production, shortness of breath and wheezing.   Cardiovascular: Negative for chest pain, palpitations and leg swelling.  Gastrointestinal: Positive for constipation, hematochezia, hemorrhoids and rectal pain. Negative for abdominal pain, anorexia, bloating, blood in stool, diarrhea, flatus, heartburn, hematemesis, melena, nausea and vomiting.  Genitourinary: Negative for difficulty urinating, dysuria, frequency, hematuria, urgency, vaginal bleeding and vaginal discharge.  Musculoskeletal: Negative for back pain, joint pain, myalgias and neck pain.  Skin: Negative for rash.  Neurological: Negative for dizziness, tingling, sensory change, focal weakness and headaches.  Endo/Heme/Allergies: Negative for environmental allergies and polydipsia. Does not bruise/bleed easily.  Psychiatric/Behavioral: Negative for depression and suicidal ideas. The patient is not nervous/anxious and does not have insomnia.      Objective  Vitals:   12/13/17 0934  BP: 120/80  Pulse: 72  Weight: 192  lb (87.1 kg)  Height: 5\' 3"  (1.6 m)    Physical Exam  Constitutional: She is well-developed, well-nourished, and in no distress. No distress.  HENT:  Head: Normocephalic and atraumatic.  Right Ear: External ear normal.  Left Ear: External ear normal.  Nose: Nose normal.  Mouth/Throat: Oropharynx is clear and moist.  Eyes: Conjunctivae and EOM are normal. Pupils are equal, round, and reactive to light. Right eye exhibits no discharge. Left eye exhibits no discharge.  Neck: Normal range of motion. Neck supple. No JVD present. No thyromegaly present.  Cardiovascular: Normal rate, regular rhythm, normal heart sounds and intact distal pulses. Exam reveals no gallop and no friction rub.  No murmur heard. Pulmonary/Chest: Effort normal and breath sounds normal. She has no wheezes. She has no rales.  Abdominal: Soft. Bowel sounds are normal. She exhibits no mass. There is no hepatosplenomegaly. There is no tenderness. There is no  rigidity, no rebound, no guarding and no CVA tenderness.  Genitourinary: Rectum normal. Rectal exam shows no external hemorrhoid, no internal hemorrhoid, no fissure, no tenderness and guaiac negative stool.  Musculoskeletal: Normal range of motion. She exhibits no edema.  Lymphadenopathy:    She has no cervical adenopathy.  Neurological: She is alert. She has normal reflexes.  Skin: Skin is warm and dry. She is not diaphoretic.  Psychiatric: Mood and affect normal.  Nursing note and vitals reviewed.     Assessment & Plan  Problem List Items Addressed This Visit      Digestive   Diverticula of colon (Chronic)     Other   Other constipation   Relevant Medications   docusate sodium (COLACE) 100 MG capsule    Other Visit Diagnoses    Rectal bleed    -  Primary   BRBPR x 2 since Thursday/check hgb if deceased refferral   Relevant Orders   CBC with Differential/Platelet   POCT occult blood stool (Completed)      Meds ordered this encounter  Medications   . docusate sodium (COLACE) 100 MG capsule    Sig: Take 1 capsule (100 mg total) by mouth 2 (two) times daily.    Dispense:  60 capsule    Refill:  5      Dr. Otilio Miu [facility]  12/13/17

## 2017-12-14 LAB — CBC WITH DIFFERENTIAL/PLATELET
BASOS: 1 %
Basophils Absolute: 0.1 10*3/uL (ref 0.0–0.2)
EOS (ABSOLUTE): 0.3 10*3/uL (ref 0.0–0.4)
EOS: 3 %
HEMATOCRIT: 36.8 % (ref 34.0–46.6)
HEMOGLOBIN: 11.8 g/dL (ref 11.1–15.9)
IMMATURE GRANS (ABS): 0 10*3/uL (ref 0.0–0.1)
IMMATURE GRANULOCYTES: 0 %
LYMPHS: 30 %
Lymphocytes Absolute: 2.4 10*3/uL (ref 0.7–3.1)
MCH: 29.1 pg (ref 26.6–33.0)
MCHC: 32.1 g/dL (ref 31.5–35.7)
MCV: 91 fL (ref 79–97)
Monocytes Absolute: 0.5 10*3/uL (ref 0.1–0.9)
Monocytes: 7 %
NEUTROS ABS: 4.7 10*3/uL (ref 1.4–7.0)
NEUTROS PCT: 59 %
Platelets: 343 10*3/uL (ref 150–379)
RBC: 4.06 x10E6/uL (ref 3.77–5.28)
RDW: 14.1 % (ref 12.3–15.4)
WBC: 7.9 10*3/uL (ref 3.4–10.8)

## 2017-12-15 ENCOUNTER — Ambulatory Visit: Payer: Self-pay

## 2017-12-16 ENCOUNTER — Ambulatory Visit: Payer: Self-pay | Admitting: Internal Medicine

## 2017-12-17 ENCOUNTER — Other Ambulatory Visit: Payer: Self-pay | Admitting: Family Medicine

## 2017-12-17 DIAGNOSIS — J309 Allergic rhinitis, unspecified: Secondary | ICD-10-CM

## 2017-12-22 ENCOUNTER — Ambulatory Visit: Payer: Self-pay

## 2017-12-30 ENCOUNTER — Encounter: Payer: Self-pay | Admitting: Internal Medicine

## 2017-12-30 ENCOUNTER — Ambulatory Visit: Payer: Medicare HMO | Admitting: Internal Medicine

## 2017-12-30 VITALS — BP 154/61 | HR 66 | Resp 16 | Ht 63.0 in | Wt 194.8 lb

## 2017-12-30 DIAGNOSIS — G4733 Obstructive sleep apnea (adult) (pediatric): Secondary | ICD-10-CM | POA: Diagnosis not present

## 2017-12-30 DIAGNOSIS — R0602 Shortness of breath: Secondary | ICD-10-CM

## 2017-12-30 DIAGNOSIS — J449 Chronic obstructive pulmonary disease, unspecified: Secondary | ICD-10-CM | POA: Diagnosis not present

## 2017-12-30 DIAGNOSIS — Z9989 Dependence on other enabling machines and devices: Secondary | ICD-10-CM

## 2017-12-30 MED ORDER — FLUTICASONE-UMECLIDIN-VILANT 100-62.5-25 MCG/INH IN AEPB: 1.0000 | INHALATION_SPRAY | Freq: Every day | RESPIRATORY_TRACT | 4 refills | Status: DC

## 2017-12-30 NOTE — Patient Instructions (Signed)

## 2017-12-30 NOTE — Progress Notes (Signed)
[facility] [place],  [id number]  Pulmonary Sleep Medicine  Office Visit Note  Patient Name: Regina Macdonald DOB: [birth year] MRN [id number]  Date of Service: 12/30/2017  Complaints/HPI: She is feeling Ok no admissions. She has not had any issues with bronchitis etc. She feels the cold weather does affect her breathing. Patient has MILD COPD based on last PFT. She is on breo and albuterol. She would like to try trelegy if possible. No chest pain or tightness. She does have SOB noted when she exerts herself  ROS  General: (-) fever, (-) chills, (-) night sweats, (-) weakness Skin: (-) rashes, (-) itching,. Eyes: (-) visual changes, (-) redness, (-) itching. Nose and Sinuses: (-) nasal stuffiness or itchiness, (-) postnasal drip, (-) nosebleeds, (-) sinus trouble. Mouth and Throat: (-) sore throat, (-) hoarseness. Neck: (-) swollen glands, (-) enlarged thyroid, (-) neck pain. Respiratory: + cough, (-) bloody sputum, + shortness of breath, + wheezing. Cardiovascular: - ankle swelling, (-) chest pain. Lymphatic: (-) lymph node enlargement. Neurologic: (-) numbness, (-) tingling. Psychiatric: (-) anxiety, (-) depression   Current Medication: Outpatient Encounter Medications as of 12/30/2017  Medication Sig Note  . acetaminophen (TYLENOL) 650 MG CR tablet Take 1 tablet by mouth every 8 (eight) hours. 04/09/2015: Received from: [place]:   . aspirin 81 MG chewable tablet Chew 1 tablet by mouth daily. 04/09/2015: Received from: [place]:   . CALCIUM & MAGNESIUM CARBONATES PO Take 1 tablet by mouth daily. 04/09/2015: Received from: [place]:   . cholecalciferol (VITAMIN D) 400 units TABS tablet Take 400 Units by mouth daily.   . Cyanocobalamin (VITAMIN B-12) 1000 MCG SUBL 1 tablet daily. 04/09/2015: Received from: [place]:   .  docusate sodium (COLACE) 100 MG capsule Take 1 capsule (100 mg total) by mouth 2 (two) times daily.   . fexofenadine (ALLEGRA) 180 MG tablet Take 180 mg by mouth daily.   Marland Kitchen FLUoxetine (PROZAC) 20 MG tablet Take 1 tablet (20 mg total) by mouth 2 (two) times daily.   . fluticasone (FLONASE) 50 MCG/ACT nasal spray USE TWO SPRAY(S) IN EACH NOSTRIL ONCE DAILY   . fluticasone (FLONASE) 50 MCG/ACT nasal spray USE TWO SPRAY(S) IN EACH NOSTRIL ONCE DAILY   . fluticasone furoate-vilanterol (BREO ELLIPTA) 100-25 MCG/INH AEPB Inhale 1 puff into the lungs daily. Dr Humphrey Rolls   . gemfibrozil (LOPID) 600 MG tablet Take 1 tablet (600 mg total) by mouth 2 (two) times daily.   . montelukast (SINGULAIR) 10 MG tablet Take 10 mg by mouth daily. Dr Chancy Milroy   . Multiple Vitamins-Minerals (CENTRUM SILVER PO) Take 1 capsule by mouth daily.   . pantoprazole (PROTONIX) 40 MG tablet Take 1 tablet (40 mg total) by mouth daily.   . polyethylene glycol (MIRALAX) packet Take 17 g by mouth daily.    No facility-administered encounter medications on file as of 12/30/2017.     Surgical History: Past Surgical History:  Procedure Laterality Date  . ABDOMINAL HYSTERECTOMY    . COLONOSCOPY  06/08/2013   Dr Candace Cruise- small mouth diverticula  . HIP SURGERY Left   . KNEE SURGERY Bilateral   . NASAL RECONSTRUCTION     x 2    Medical History: Past Medical History:  Diagnosis Date  . Anxiety   . Asthma   . Cancer ([place]) cervical-1973  . COPD (chronic obstructive pulmonary disease) ([place])   .  Depression   . Diverticula, colon   . Diverticulosis   . GERD (gastroesophageal reflux disease)   . Sleep apnea     Family History: Family History  Problem Relation Age of Onset  . Cirrhosis Father   . Coronary artery disease Mother   . Breast cancer Neg Hx     Social History: Social History   Socioeconomic History  . Marital status: Divorced    Spouse name: Not on file  . Number of children: Not on file  . Years of education: Not on  file  . Highest education level: Not on file  Social Needs  . Financial resource strain: Not on file  . Food insecurity - worry: Not on file  . Food insecurity - inability: Not on file  . Transportation needs - medical: Not on file  . Transportation needs - non-medical: Not on file  Occupational History  . Not on file  Tobacco Use  . Smoking status: Never Smoker  . Smokeless tobacco: Never Used  Substance and Sexual Activity  . Alcohol use: No    Alcohol/week: 0.0 oz  . Drug use: No  . Sexual activity: No    Birth control/protection: Post-menopausal  Other Topics Concern  . Not on file  Social History Narrative  . Not on file    Vital Signs: Blood pressure (!) 154/61, pulse 66, resp. rate 16, height 5\' 3"  (1.6 m), weight 194 lb 12.8 oz (88.4 kg), SpO2 96 %.  Examination: General Appearance: The patient is well-developed, well-nourished, and in no distress. Skin: Gross inspection of skin unremarkable. Head: normocephalic, no gross deformities. Eyes: no gross deformities noted. ENT: ears appear grossly normal no exudates. Neck: Supple. No thyromegaly. No LAD. Respiratory: scattered rhonchi noted. Cardiovascular: Normal S1 and S2 without murmur or rub. Extremities: No cyanosis. pulses are equal. Neurologic: Alert and oriented. No involuntary movements.  LABS: Recent Results (from the past 2160 hour(s))  POCT occult blood stool     Status: None   Collection Time: 12/13/17 10:09 AM  Result Value Ref Range   Fecal Occult Blood, POC Negative Negative   Card #1 Date     Card #2 Fecal Occult Blod, POC     Card #2 Date     Card #3 Fecal Occult Blood, POC     Card #3 Date    CBC with Differential/Platelet     Status: None   Collection Time: 12/13/17 10:51 AM  Result Value Ref Range   WBC 7.9 3.4 - 10.8 x10E3/uL   RBC 4.06 3.77 - 5.28 x10E6/uL   Hemoglobin 11.8 11.1 - 15.9 g/dL   Hematocrit 36.8 34.0 - 46.6 %   MCV 91 79 - 97 fL   MCH 29.1 26.6 - 33.0 pg   MCHC 32.1  31.5 - 35.7 g/dL   RDW 14.1 12.3 - 15.4 %   Platelets 343 150 - 379 x10E3/uL   Neutrophils 59 Not Estab. %   Lymphs 30 Not Estab. %   Monocytes 7 Not Estab. %   Eos 3 Not Estab. %   Basos 1 Not Estab. %   Neutrophils Absolute 4.7 1.4 - 7.0 x10E3/uL   Lymphocytes Absolute 2.4 0.7 - 3.1 x10E3/uL   Monocytes Absolute 0.5 0.1 - 0.9 x10E3/uL   EOS (ABSOLUTE) 0.3 0.0 - 0.4 x10E3/uL   Basophils Absolute 0.1 0.0 - 0.2 x10E3/uL   Immature Granulocytes 0 Not Estab. %   Immature Grans (Abs) 0.0 0.0 - 0.1 x10E3/uL    Radiology: Ct  Abdomen Pelvis W Contrast  Result Date: 07/20/2017 CLINICAL DATA:  Generalized abdominal pain and constipation for 4 days. History of diverticulosis, cancer. EXAM: CT ABDOMEN AND PELVIS WITH CONTRAST TECHNIQUE: Multidetector CT imaging of the abdomen and pelvis was performed using the standard protocol following bolus administration of intravenous contrast. CONTRAST:  155mL ISOVUE-300 IOPAMIDOL (ISOVUE-300) INJECTION 61% COMPARISON:  Acute abdominal series July 20, 2017 at 1555 hours and CT abdomen and pelvis Apr 02, 2014 FINDINGS: LOWER CHEST: Elevated RIGHT hemidiaphragm with RIGHT lung base enhancing atelectasis. LEFT lower lobe atelectasis/ scarring. Heart size is normal. Mitral annular calcifications. No pericardial effusion. HEPATOBILIARY: The liver is diffusely hypodense compatible with steatosis. Dependent 2 cm gallstone without CT findings of acute cholecystitis. PANCREAS: Normal. SPLEEN: Normal. ADRENALS/URINARY TRACT: Kidneys are orthotopic, demonstrating symmetric enhancement. No nephrolithiasis, hydronephrosis or solid renal masses. Too small to characterize hypodensities upper pole bilateral kidneys. LEFT extra renal pelvis. The unopacified ureters are normal in course and caliber. Delayed imaging through the kidneys demonstrates symmetric prompt contrast excretion within the proximal urinary collecting system. Urinary bladder is partially distended and  unremarkable. Normal adrenal glands. STOMACH/BOWEL: The stomach, small and large bowel are normal in course and caliber without inflammatory changes. Small P volume retained large bowel stool. Severe descending and sigmoid colonic diverticulosis. Normal appendix. VASCULAR/LYMPHATIC: Aortoiliac vessels are normal in course and caliber. Mild calcific atherosclerosis. No lymphadenopathy by CT size criteria. REPRODUCTIVE: Status post hysterectomy. OTHER: No intraperitoneal free fluid or free air. MUSCULOSKELETAL: Nonacute. Streak artifact from LEFT hip arthroplasty. 1 cm bone island RIGHT sacrum. Small fat containing umbilical hernia. Minimal grade 1 L four 5 anterolisthesis without spondylolysis. Moderate degenerative change of thoracic spine. IMPRESSION: 1. Severe colonic diverticulosis and small amount of retained large bowel stool without acute diverticulitis nor bowel obstruction. 2. Cholelithiasis without acute cholecystitis. Aortic Atherosclerosis (ICD10-I70.0). Electronically Signed   By: Elon Alas M.D.   On: 07/20/2017 17:42   Dg Abdomen Acute W/chest  Result Date: 07/20/2017 CLINICAL DATA:  Acute right lower quadrant abdominal pain, constipation. EXAM: DG ABDOMEN ACUTE W/ 1V CHEST COMPARISON:  Radiographs of December 23, 2016. FINDINGS: There is no evidence of dilated bowel loops or free intraperitoneal air. Moderate amount of stool is noted. Phleboliths are noted in the pelvis. Heart size and mediastinal contours are within normal limits. Both lungs are clear. IMPRESSION: No evidence of bowel obstruction or ileus. Moderate stool burden is noted. No acute cardiopulmonary disease. Electronically Signed   By: Marijo Conception, M.D.   On: 07/20/2017 16:19    No results found.  No results found.    Assessment and Plan: Patient Active Problem List   Diagnosis Date Noted  . Other constipation 12/13/2017  . Depression with anxiety 08/19/2017  . Hyperlipidemia 08/19/2017  . Gastroesophageal  reflux disease without esophagitis 08/19/2017  . Other chest pain 04/16/2015  . COPD exacerbation ([place]) 04/16/2015  . Sleep apnea 04/16/2015  . 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 Asthmatic bronchitis 04/16/2015  . Abdominal pain, LLQ 03/29/2014  . Rectal bleeding 03/29/2014  . 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 Osteoarthrosis, unspecified whether generalized or localized, pelvic region and thigh 03/26/2014    1. COPD she will be scheduled for a follow up PFT. She will also be tried on trelegy inhaler hold breo while on this 2. SOB stable at baseline worse with cold though 3. OSA on CPAP device  General Counseling: I have discussed the findings of the evaluation and examination with Regina Macdonald.  I have also discussed any  further diagnostic evaluation thatmay be needed or ordered today. Regina Macdonald verbalizes understanding of the findings of todays visit. We also reviewed her medications today and discussed drug interactions and side effects including but not limited excessive drowsiness and altered mental states. We also discussed that there is always a risk not just to her but also people around her. she has been encouraged to call the office with any questions or concerns that should arise related to todays visit.    Time spent: 78min  I have personally obtained a history, examined the patient, evaluated laboratory and imaging results, formulated the assessment and plan and placed orders.    [name], MD [facility] Pulmonary and Critical Care Sleep medicine

## 2018-01-05 ENCOUNTER — Ambulatory Visit: Payer: Self-pay | Admitting: Internal Medicine

## 2018-01-07 DIAGNOSIS — G4733 Obstructive sleep apnea (adult) (pediatric): Secondary | ICD-10-CM | POA: Diagnosis not present

## 2018-01-12 ENCOUNTER — Other Ambulatory Visit: Payer: Self-pay

## 2018-01-17 ENCOUNTER — Other Ambulatory Visit: Payer: Self-pay | Admitting: Family Medicine

## 2018-01-17 ENCOUNTER — Other Ambulatory Visit: Payer: Self-pay

## 2018-01-17 DIAGNOSIS — F419 Anxiety disorder, unspecified: Secondary | ICD-10-CM

## 2018-01-17 DIAGNOSIS — F418 Other specified anxiety disorders: Secondary | ICD-10-CM

## 2018-01-19 ENCOUNTER — Ambulatory Visit (INDEPENDENT_AMBULATORY_CARE_PROVIDER_SITE_OTHER): Payer: Medicare HMO

## 2018-01-19 DIAGNOSIS — G4733 Obstructive sleep apnea (adult) (pediatric): Secondary | ICD-10-CM

## 2018-01-19 NOTE — Progress Notes (Signed)
95 percentile pressure 9   95th percentile leak 32.4   apnea index 0.3 /hr  apnea-hypopnea index  2.2 /hr   total days used  >4 hr 85 days  total days used <4 hr 4 days  Total compliance 95 percent  Pt stated no problems

## 2018-02-15 ENCOUNTER — Telehealth: Payer: Self-pay

## 2018-02-15 ENCOUNTER — Other Ambulatory Visit: Payer: Self-pay

## 2018-02-15 DIAGNOSIS — F418 Other specified anxiety disorders: Secondary | ICD-10-CM

## 2018-02-15 DIAGNOSIS — J309 Allergic rhinitis, unspecified: Secondary | ICD-10-CM

## 2018-02-15 DIAGNOSIS — F419 Anxiety disorder, unspecified: Secondary | ICD-10-CM

## 2018-02-15 DIAGNOSIS — E785 Hyperlipidemia, unspecified: Secondary | ICD-10-CM

## 2018-02-15 DIAGNOSIS — K219 Gastro-esophageal reflux disease without esophagitis: Secondary | ICD-10-CM

## 2018-02-15 MED ORDER — FLUTICASONE PROPIONATE 50 MCG/ACT NA SUSP: NASAL | 0 refills | Status: DC

## 2018-02-15 MED ORDER — PANTOPRAZOLE SODIUM 40 MG PO TBEC: 40.0000 mg | DELAYED_RELEASE_TABLET | Freq: Every day | ORAL | 0 refills | Status: DC

## 2018-02-15 MED ORDER — FLUOXETINE HCL 20 MG PO TABS: 20.0000 mg | ORAL_TABLET | Freq: Two times a day (BID) | ORAL | 0 refills | Status: DC

## 2018-02-15 MED ORDER — GEMFIBROZIL 600 MG PO TABS: 600.0000 mg | ORAL_TABLET | Freq: Two times a day (BID) | ORAL | 0 refills | Status: DC

## 2018-02-15 NOTE — Telephone Encounter (Signed)
Pt called wanting 2 Rxs sent in- Pantoprazole and fluoxetine. Also requested that they be in tablet form. Sent in to [facility] with 0 additional refills. Need to see pt in May.

## 2018-02-15 NOTE — Telephone Encounter (Signed)
Pt called again wanting lopid and flonase nasal spray added to meds called in. Done

## 2018-03-16 ENCOUNTER — Ambulatory Visit: Payer: Medicare HMO | Admitting: Family Medicine

## 2018-03-23 ENCOUNTER — Ambulatory Visit (INDEPENDENT_AMBULATORY_CARE_PROVIDER_SITE_OTHER): Payer: Medicare HMO | Admitting: Family Medicine

## 2018-03-23 ENCOUNTER — Encounter: Payer: Self-pay | Admitting: Family Medicine

## 2018-03-23 VITALS — BP 120/70 | HR 76 | Ht 63.0 in | Wt 188.0 lb

## 2018-03-23 DIAGNOSIS — Z1231 Encounter for screening mammogram for malignant neoplasm of breast: Secondary | ICD-10-CM

## 2018-03-23 DIAGNOSIS — K219 Gastro-esophageal reflux disease without esophagitis: Secondary | ICD-10-CM | POA: Diagnosis not present

## 2018-03-23 DIAGNOSIS — F330 Major depressive disorder, recurrent, mild: Secondary | ICD-10-CM

## 2018-03-23 DIAGNOSIS — R69 Illness, unspecified: Secondary | ICD-10-CM | POA: Diagnosis not present

## 2018-03-23 DIAGNOSIS — F419 Anxiety disorder, unspecified: Secondary | ICD-10-CM | POA: Diagnosis not present

## 2018-03-23 DIAGNOSIS — E785 Hyperlipidemia, unspecified: Secondary | ICD-10-CM

## 2018-03-23 DIAGNOSIS — Z1239 Encounter for other screening for malignant neoplasm of breast: Secondary | ICD-10-CM

## 2018-03-23 MED ORDER — FLUOXETINE HCL 10 MG PO TABS: 10.0000 mg | ORAL_TABLET | Freq: Two times a day (BID) | ORAL | 5 refills | Status: DC

## 2018-03-23 MED ORDER — PANTOPRAZOLE SODIUM 40 MG PO TBEC: 40.0000 mg | DELAYED_RELEASE_TABLET | Freq: Every day | ORAL | 0 refills | Status: DC

## 2018-03-23 MED ORDER — GEMFIBROZIL 600 MG PO TABS: 600.0000 mg | ORAL_TABLET | Freq: Two times a day (BID) | ORAL | 0 refills | Status: DC

## 2018-03-23 NOTE — Progress Notes (Signed)
Name: Regina Macdonald   MRN: [id number]    DOB: [birth year]   Date:03/23/2018       Progress Note  Subjective  Chief Complaint  Chief Complaint  Patient presents with  . Gastroesophageal Reflux  . Hyperlipidemia  . Anxiety    refill 10mg  ONLY    Gastroesophageal Reflux  She reports no abdominal pain, no belching, no chest pain, no choking, no coughing, no dysphagia, no early satiety, no globus sensation, no heartburn, no hoarse voice, no nausea, no sore throat, no stridor or no wheezing. This is a chronic problem. The current episode started more than 1 year ago. The problem occurs occasionally. The problem has been gradually improving. The symptoms are aggravated by certain foods. Pertinent negatives include no melena, muscle weakness, orthopnea or weight loss. There are no known risk factors. She has tried a PPI for the symptoms. The treatment provided moderate relief. Past procedures do not include an abdominal ultrasound, an EGD, esophageal manometry, esophageal pH monitoring, H. pylori antibody titer or a UGI.  Hyperlipidemia  This is a chronic problem. The current episode started more than 1 year ago. The problem is controlled. Recent lipid tests were reviewed and are normal. She has no history of chronic renal disease, diabetes, hypothyroidism, liver disease, obesity or nephrotic syndrome. There are no known factors aggravating her hyperlipidemia. Pertinent negatives include no chest pain, focal sensory loss, focal weakness, leg pain, myalgias or shortness of breath. She is currently on no antihyperlipidemic treatment. There are no compliance problems.  Risk factors for coronary artery disease include dyslipidemia.  Anxiety  Presents for follow-up visit. Symptoms include irritability and nervous/anxious behavior. Patient reports no chest pain, compulsions, confusion, decreased concentration, depressed mood, dizziness, dry mouth, excessive worry, feeling of choking, hyperventilation, impotence,  insomnia, malaise, muscle tension, nausea, obsessions, palpitations, panic, restlessness, shortness of breath or suicidal ideas.      No problem-specific Assessment & Plan notes found for this encounter.   Past Medical History:  Diagnosis Date  . Anxiety   . Asthma   . Cancer ([place]) cervical-1973  . COPD (chronic obstructive pulmonary disease) ([place])   . Depression   . Diverticula, colon   . Diverticulosis   . GERD (gastroesophageal reflux disease)   . Sleep apnea     Past Surgical History:  Procedure Laterality Date  . ABDOMINAL HYSTERECTOMY    . COLONOSCOPY  06/08/2013   Dr Candace Cruise- small mouth diverticula  . HIP SURGERY Left   . KNEE SURGERY Bilateral   . NASAL RECONSTRUCTION     x 2    Family History  Problem Relation Age of Onset  . Cirrhosis Father   . Coronary artery disease Mother   . Breast cancer Neg Hx     Social History   Socioeconomic History  . Marital status: Divorced    Spouse name: Not on file  . Number of children: Not on file  . Years of education: Not on file  . Highest education level: Not on file  Occupational History  . Not on file  Social Needs  . Financial resource strain: Not on file  . Food insecurity:    Worry: Not on file    Inability: Not on file  . Transportation needs:    Medical: Not on file    Non-medical: Not on file  Tobacco Use  . Smoking status: Never Smoker  . Smokeless tobacco: Never Used  Substance and Sexual Activity  . Alcohol use: No    Alcohol/week:  0.0 oz  . Drug use: No  . Sexual activity: Never    Birth control/protection: Post-menopausal  Lifestyle  . Physical activity:    Days per week: Not on file    Minutes per session: Not on file  . Stress: Not on file  Relationships  . Social connections:    Talks on phone: Not on file    Gets together: Not on file    Attends religious service: Not on file    Active member of club or organization: Not on file    Attends meetings of clubs or organizations: Not  on file    Relationship status: Not on file  . Intimate partner violence:    Fear of current or ex partner: Not on file    Emotionally abused: Not on file    Physically abused: Not on file    Forced sexual activity: Not on file  Other Topics Concern  . Not on file  Social History Narrative  . Not on file    Allergies  Allergen Reactions  . Ciprofloxacin Other (See Comments)  . Etodolac Other (See Comments)  . Sulfa Antibiotics Rash    Outpatient Medications Prior to Visit  Medication Sig Dispense Refill  . acetaminophen (TYLENOL) 650 MG CR tablet Take 1 tablet by mouth every 8 (eight) hours.    Marland Kitchen aspirin 81 MG chewable tablet Chew 1 tablet by mouth daily.    Marland Kitchen CALCIUM & MAGNESIUM CARBONATES PO Take 1 tablet by mouth daily.    . cholecalciferol (VITAMIN D) 400 units TABS tablet Take 400 Units by mouth daily.    . Cyanocobalamin (VITAMIN B-12) 1000 MCG SUBL 1 tablet daily.    Marland Kitchen docusate sodium (COLACE) 100 MG capsule Take 1 capsule (100 mg total) by mouth 2 (two) times daily. 60 capsule 5  . fexofenadine (ALLEGRA) 180 MG tablet Take 180 mg by mouth daily.    Marland Kitchen FLUoxetine (PROZAC) 20 MG capsule Take 20 mg by mouth every morning.    . fluticasone (FLONASE) 50 MCG/ACT nasal spray USE TWO SPRAY(S) IN EACH NOSTRIL ONCE DAILY 16 g 0  . Fluticasone-Umeclidin-Vilant (TRELEGY ELLIPTA) 100-62.5-25 MCG/INH AEPB Inhale 1 puff into the lungs daily. 1 each 4  . montelukast (SINGULAIR) 10 MG tablet Take 10 mg by mouth daily. Dr Chancy Milroy    . Multiple Vitamins-Minerals (CENTRUM SILVER PO) Take 1 capsule by mouth daily.    . polyethylene glycol (MIRALAX) packet Take 17 g by mouth daily. 14 each 0  . FLUoxetine (PROZAC) 10 MG tablet Take 10 mg by mouth 2 (two) times daily. Pt taking 20mg  + 10mg =30mg  in the am and 10mg  in the evening    . gemfibrozil (LOPID) 600 MG tablet Take 1 tablet (600 mg total) by mouth 2 (two) times daily. 60 tablet 0  . pantoprazole (PROTONIX) 40 MG tablet Take 1 tablet (40 mg  total) by mouth daily. 30 tablet 0  . FLUoxetine (PROZAC) 20 MG tablet Take 1 tablet (20 mg total) by mouth 2 (two) times daily. (Patient not taking: Reported on 03/23/2018) 60 tablet 0  . fluticasone (FLONASE) 50 MCG/ACT nasal spray USE TWO SPRAY(S) IN EACH NOSTRIL ONCE DAILY 16 g 1  . fluticasone furoate-vilanterol (BREO ELLIPTA) 100-25 MCG/INH AEPB Inhale 1 puff into the lungs daily. Dr Humphrey Rolls 60 each 3   No facility-administered medications prior to visit.     Review of Systems  Constitutional: Positive for irritability. Negative for chills, fever, malaise/fatigue and weight loss.  HENT: Negative for  ear discharge, ear pain, hoarse voice and sore throat.   Eyes: Negative for blurred vision.  Respiratory: Negative for cough, sputum production, choking, shortness of breath and wheezing.   Cardiovascular: Negative for chest pain, palpitations and leg swelling.  Gastrointestinal: Negative for abdominal pain, blood in stool, constipation, diarrhea, dysphagia, heartburn, melena and nausea.  Genitourinary: Negative for dysuria, frequency, hematuria, impotence and urgency.  Musculoskeletal: Negative for back pain, joint pain, myalgias, muscle weakness and neck pain.  Skin: Negative for rash.  Neurological: Negative for dizziness, tingling, sensory change, focal weakness and headaches.  Endo/Heme/Allergies: Negative for environmental allergies and polydipsia. Does not bruise/bleed easily.  Psychiatric/Behavioral: Negative for confusion, decreased concentration, depression and suicidal ideas. The patient is nervous/anxious. The patient does not have insomnia.      Objective  Vitals:   03/23/18 1045  BP: 120/70  Pulse: 76  Weight: 188 lb (85.3 kg)  Height: 5\' 3"  (1.6 m)    Physical Exam  Constitutional: She is oriented to person, place, and time. She appears well-developed and well-nourished.  HENT:  Head: Normocephalic.  Right Ear: External ear normal.  Left Ear: External ear normal.   Mouth/Throat: Oropharynx is clear and moist.  Eyes: Pupils are equal, round, and reactive to light. Conjunctivae and EOM are normal. Lids are everted and swept, no foreign bodies found. Left eye exhibits no hordeolum. No foreign body present in the left eye. Right conjunctiva is not injected. Left conjunctiva is not injected. No scleral icterus.  Neck: Normal range of motion. Neck supple. No JVD present. No tracheal deviation present. No thyromegaly present.  Cardiovascular: Normal rate, regular rhythm, normal heart sounds and intact distal pulses. Exam reveals no gallop and no friction rub.  No murmur heard. Pulmonary/Chest: Effort normal and breath sounds normal. No respiratory distress. She has no wheezes. She has no rales.  Abdominal: Soft. Bowel sounds are normal. She exhibits no mass. There is no hepatosplenomegaly. There is no tenderness. There is no rebound and no guarding.  Musculoskeletal: Normal range of motion. She exhibits no edema or tenderness.  Lymphadenopathy:    She has no cervical adenopathy.  Neurological: She is alert and oriented to person, place, and time. She has normal strength. She displays normal reflexes. No cranial nerve deficit.  Skin: Skin is warm. No rash noted.  Psychiatric: She has a normal mood and affect. Her mood appears not anxious. She does not exhibit a depressed mood.  Nursing note and vitals reviewed.     Assessment & Plan  Problem List Items Addressed This Visit      Digestive   Gastroesophageal reflux disease without esophagitis   Relevant Medications   pantoprazole (PROTONIX) 40 MG tablet     Other   Hyperlipidemia - Primary   Relevant Medications   gemfibrozil (LOPID) 600 MG tablet   Other Relevant Orders   Lipid Panel With LDL/HDL Ratio   Mild episode of recurrent major depressive disorder (HCC)   Relevant Medications   FLUoxetine (PROZAC) 20 MG capsule   FLUoxetine (PROZAC) 10 MG tablet   Anxiety   Relevant Medications    FLUoxetine (PROZAC) 20 MG capsule   FLUoxetine (PROZAC) 10 MG tablet   Taking medication for chronic disease   Relevant Orders   Hepatic function panel    Other Visit Diagnoses    Breast cancer screening       Relevant Orders   MM Digital Screening      Meds ordered this encounter  Medications  . FLUoxetine (PROZAC)  10 MG tablet    Sig: Take 1 tablet (10 mg total) by mouth 2 (two) times daily. Pt taking 20mg  + 10mg =30mg  in the am and 10mg  in the evening    Dispense:  30 tablet    Refill:  5  . gemfibrozil (LOPID) 600 MG tablet    Sig: Take 1 tablet (600 mg total) by mouth 2 (two) times daily.    Dispense:  60 tablet    Refill:  0    Pt only wants 30 days  . pantoprazole (PROTONIX) 40 MG tablet    Sig: Take 1 tablet (40 mg total) by mouth daily.    Dispense:  30 tablet    Refill:  0    Pt needs appt      Dr. Otilio Miu [facility]  03/23/18

## 2018-03-24 ENCOUNTER — Ambulatory Visit: Payer: Medicare HMO | Admitting: Internal Medicine

## 2018-03-24 ENCOUNTER — Encounter: Payer: Self-pay | Admitting: Internal Medicine

## 2018-03-24 VITALS — BP 174/84 | HR 69 | Resp 16 | Ht 63.0 in | Wt 190.2 lb

## 2018-03-24 DIAGNOSIS — G4733 Obstructive sleep apnea (adult) (pediatric): Secondary | ICD-10-CM

## 2018-03-24 DIAGNOSIS — R05 Cough: Secondary | ICD-10-CM

## 2018-03-24 DIAGNOSIS — R059 Cough, unspecified: Secondary | ICD-10-CM

## 2018-03-24 DIAGNOSIS — Z9989 Dependence on other enabling machines and devices: Secondary | ICD-10-CM | POA: Diagnosis not present

## 2018-03-24 DIAGNOSIS — J449 Chronic obstructive pulmonary disease, unspecified: Secondary | ICD-10-CM | POA: Diagnosis not present

## 2018-03-24 LAB — HEPATIC FUNCTION PANEL
ALK PHOS: 123 IU/L — AB (ref 39–117)
ALT: 17 IU/L (ref 0–32)
AST: 26 IU/L (ref 0–40)
Albumin: 4.5 g/dL (ref 3.5–4.8)
BILIRUBIN, DIRECT: 0.11 mg/dL (ref 0.00–0.40)
Bilirubin Total: 0.3 mg/dL (ref 0.0–1.2)
Total Protein: 6.9 g/dL (ref 6.0–8.5)

## 2018-03-24 LAB — LIPID PANEL WITH LDL/HDL RATIO
Cholesterol, Total: 144 mg/dL (ref 100–199)
HDL: 44 mg/dL (ref >39)
LDL Calculated: 85 mg/dL (ref 0–99)
LDL/HDL RATIO: 1.9 ratio (ref 0.0–3.2)
Triglycerides: 75 mg/dL (ref 0–149)
VLDL CHOLESTEROL CAL: 15 mg/dL (ref 5–40)

## 2018-03-24 MED ORDER — FLUTICASONE FUROATE-VILANTEROL 100-25 MCG/INH IN AEPB: 1.0000 | INHALATION_SPRAY | Freq: Every day | RESPIRATORY_TRACT | 4 refills | Status: DC

## 2018-03-24 MED ORDER — AZITHROMYCIN 250 MG PO TABS: ORAL_TABLET | ORAL | 0 refills | Status: DC

## 2018-03-24 NOTE — Patient Instructions (Signed)

## 2018-03-24 NOTE — Progress Notes (Signed)
[facility] [address], [place] [address]  Pulmonary Sleep Medicine   Office Visit Note  Patient Name: Regina Macdonald DOB: 21-Apr-1941 MRN [id number]  Date of Service: 03/24/2018  Complaints/HPI:  She states she has been having cough which she feels has been started when she was switched over to the trilogy inhaler.  She states she did not have this cough when she was taking breo and she states that this was helping her quite a bit.  Patient states she has some sputum production occasion no hemoptysis is noted.  She denies chest pain.  She states had showed improvement when she was taking other inhaler  ROS  General: (-) fever, (-) chills, (-) night sweats, (-) weakness Skin: (-) rashes, (-) itching,. Eyes: (-) visual changes, (-) redness, (-) itching. Nose and Sinuses: (-) nasal stuffiness or itchiness, (-) postnasal drip, (-) nosebleeds, (-) sinus trouble. Mouth and Throat: (-) sore throat, (-) hoarseness. Neck: (-) swollen glands, (-) enlarged thyroid, (-) neck pain. Respiratory: + cough, (-) bloody sputum, + shortness of breath, - wheezing. Cardiovascular: - ankle swelling, (-) chest pain. Lymphatic: (-) lymph node enlargement. Neurologic: (-) numbness, (-) tingling. Psychiatric: (-) anxiety, (-) depression   Current Medication: Outpatient Encounter Medications as of 03/24/2018  Medication Sig Note  . acetaminophen (TYLENOL) 650 MG CR tablet Take 1 tablet by mouth every 8 (eight) hours. 04/09/2015: Received from: [place]:   . aspirin 81 MG chewable tablet Chew 1 tablet by mouth daily. 04/09/2015: Received from: [place]:   . CALCIUM & MAGNESIUM CARBONATES PO Take 1 tablet by mouth daily. 04/09/2015: Received from: [place]:   . cholecalciferol (VITAMIN D) 400 units TABS tablet Take 400 Units by mouth daily.   . Cyanocobalamin (VITAMIN B-12) 1000 MCG SUBL 1  tablet daily. 04/09/2015: Received from: [place]:   . docusate sodium (COLACE) 100 MG capsule Take 1 capsule (100 mg total) by mouth 2 (two) times daily.   . fexofenadine (ALLEGRA) 180 MG tablet Take 180 mg by mouth daily.   Marland Kitchen FLUoxetine (PROZAC) 10 MG tablet Take 1 tablet (10 mg total) by mouth 2 (two) times daily. Pt taking 20mg  + 10mg =30mg  in the am and 10mg  in the evening   . FLUoxetine (PROZAC) 20 MG capsule Take 20 mg by mouth every morning.   Marland Kitchen FLUoxetine (PROZAC) 20 MG tablet Take 1 tablet (20 mg total) by mouth 2 (two) times daily. (Patient not taking: Reported on 03/23/2018)   . fluticasone (FLONASE) 50 MCG/ACT nasal spray USE TWO SPRAY(S) IN EACH NOSTRIL ONCE DAILY   . Fluticasone-Umeclidin-Vilant (TRELEGY ELLIPTA) 100-62.5-25 MCG/INH AEPB Inhale 1 puff into the lungs daily.   Marland Kitchen gemfibrozil (LOPID) 600 MG tablet Take 1 tablet (600 mg total) by mouth 2 (two) times daily.   . montelukast (SINGULAIR) 10 MG tablet Take 10 mg by mouth daily. Dr Chancy Milroy   . Multiple Vitamins-Minerals (CENTRUM SILVER PO) Take 1 capsule by mouth daily.   . pantoprazole (PROTONIX) 40 MG tablet Take 1 tablet (40 mg total) by mouth daily.   . polyethylene glycol (MIRALAX) packet Take 17 g by mouth daily.    No facility-administered encounter medications on file as of 03/24/2018.     Surgical History: Past Surgical History:  Procedure Laterality Date  . ABDOMINAL HYSTERECTOMY    . COLONOSCOPY  06/08/2013   Dr Candace Cruise- small mouth diverticula  . HIP  SURGERY Left   . KNEE SURGERY Bilateral   . NASAL RECONSTRUCTION     x 2    Medical History: Past Medical History:  Diagnosis Date  . Anxiety   . Asthma   . Cancer ([place]) cervical-1973  . COPD (chronic obstructive pulmonary disease) ([place])   . Depression   . Diverticula, colon   . Diverticulosis   . GERD (gastroesophageal reflux disease)   . Sleep apnea     Family History: Family History  Problem Relation Age of Onset  .  Cirrhosis Father   . Coronary artery disease Mother   . Breast cancer Neg Hx     Social History: Social History   Socioeconomic History  . Marital status: Divorced    Spouse name: Not on file  . Number of children: Not on file  . Years of education: Not on file  . Highest education level: Not on file  Occupational History  . Not on file  Social Needs  . Financial resource strain: Not on file  . Food insecurity:    Worry: Not on file    Inability: Not on file  . Transportation needs:    Medical: Not on file    Non-medical: Not on file  Tobacco Use  . Smoking status: Never Smoker  . Smokeless tobacco: Never Used  Substance and Sexual Activity  . Alcohol use: No    Alcohol/week: 0.0 oz  . Drug use: No  . Sexual activity: Never    Birth control/protection: Post-menopausal  Lifestyle  . Physical activity:    Days per week: Not on file    Minutes per session: Not on file  . Stress: Not on file  Relationships  . Social connections:    Talks on phone: Not on file    Gets together: Not on file    Attends religious service: Not on file    Active member of club or organization: Not on file    Attends meetings of clubs or organizations: Not on file    Relationship status: Not on file  . Intimate partner violence:    Fear of current or ex partner: Not on file    Emotionally abused: Not on file    Physically abused: Not on file    Forced sexual activity: Not on file  Other Topics Concern  . Not on file  Social History Narrative  . Not on file    Vital Signs: Blood pressure (!) 174/84, pulse 69, resp. rate 16, height 5\' 3"  (1.6 m), weight 190 lb 3.2 oz (86.3 kg), SpO2 95 %.  Examination: General Appearance: The patient is well-developed, well-nourished, and in no distress. Skin: Gross inspection of skin unremarkable. Head: normocephalic, no gross deformities. Eyes: no gross deformities noted. ENT: ears appear grossly normal no exudates. Neck: Supple. No thyromegaly.  No LAD. Respiratory: scattered rhonchi noted. Cardiovascular: Normal S1 and S2 without murmur or rub. Extremities: No cyanosis. pulses are equal. Neurologic: Alert and oriented. No involuntary movements.  LABS: Recent Results (from the past 2160 hour(s))  Lipid Panel With LDL/HDL Ratio     Status: None   Collection Time: 03/23/18 11:36 AM  Result Value Ref Range   Cholesterol, Total 144 100 - 199 mg/dL   Triglycerides 75 0 - 149 mg/dL   HDL 44 >39 mg/dL   VLDL Cholesterol Cal 15 5 - 40 mg/dL   LDL Calculated 85 0 - 99 mg/dL   LDl/HDL Ratio 1.9 0.0 - 3.2 ratio    Comment:  LDL/HDL Ratio                                             Men  Women                               1/2 Avg.Risk  1.0    1.5                                   Avg.Risk  3.6    3.2                                2X Avg.Risk  6.2    5.0                                3X Avg.Risk  8.0    6.1   Hepatic function panel     Status: Abnormal   Collection Time: 03/23/18 11:36 AM  Result Value Ref Range   Total Protein 6.9 6.0 - 8.5 g/dL   Albumin 4.5 3.5 - 4.8 g/dL   Bilirubin Total 0.3 0.0 - 1.2 mg/dL   Bilirubin, Direct 0.11 0.00 - 0.40 mg/dL   Alkaline Phosphatase 123 (H) 39 - 117 IU/L   AST 26 0 - 40 IU/L   ALT 17 0 - 32 IU/L    Radiology: Ct Abdomen Pelvis W Contrast  Result Date: 07/20/2017 CLINICAL DATA:  Generalized abdominal pain and constipation for 4 days. History of diverticulosis, cancer. EXAM: CT ABDOMEN AND PELVIS WITH CONTRAST TECHNIQUE: Multidetector CT imaging of the abdomen and pelvis was performed using the standard protocol following bolus administration of intravenous contrast. CONTRAST:  192mL ISOVUE-300 IOPAMIDOL (ISOVUE-300) INJECTION 61% COMPARISON:  Acute abdominal series July 20, 2017 at 1555 hours and CT abdomen and pelvis Apr 02, 2014 FINDINGS: LOWER CHEST: Elevated RIGHT hemidiaphragm with RIGHT lung base enhancing atelectasis. LEFT lower lobe  atelectasis/ scarring. Heart size is normal. Mitral annular calcifications. No pericardial effusion. HEPATOBILIARY: The liver is diffusely hypodense compatible with steatosis. Dependent 2 cm gallstone without CT findings of acute cholecystitis. PANCREAS: Normal. SPLEEN: Normal. ADRENALS/URINARY TRACT: Kidneys are orthotopic, demonstrating symmetric enhancement. No nephrolithiasis, hydronephrosis or solid renal masses. Too small to characterize hypodensities upper pole bilateral kidneys. LEFT extra renal pelvis. The unopacified ureters are normal in course and caliber. Delayed imaging through the kidneys demonstrates symmetric prompt contrast excretion within the proximal urinary collecting system. Urinary bladder is partially distended and unremarkable. Normal adrenal glands. STOMACH/BOWEL: The stomach, small and large bowel are normal in course and caliber without inflammatory changes. Small P volume retained large bowel stool. Severe descending and sigmoid colonic diverticulosis. Normal appendix. VASCULAR/LYMPHATIC: Aortoiliac vessels are normal in course and caliber. Mild calcific atherosclerosis. No lymphadenopathy by CT size criteria. REPRODUCTIVE: Status post hysterectomy. OTHER: No intraperitoneal free fluid or free air. MUSCULOSKELETAL: Nonacute. Streak artifact from LEFT hip arthroplasty. 1 cm bone island RIGHT sacrum. Small fat containing umbilical hernia. Minimal grade 1 L four 5 anterolisthesis without spondylolysis. Moderate degenerative change of thoracic spine. IMPRESSION: 1. Severe colonic diverticulosis and small amount of retained large bowel stool without acute diverticulitis nor bowel  obstruction. 2. Cholelithiasis without acute cholecystitis. Aortic Atherosclerosis (ICD10-I70.0). Electronically Signed   By: Elon Alas M.D.   On: 07/20/2017 17:42   Dg Abdomen Acute W/chest  Result Date: 07/20/2017 CLINICAL DATA:  Acute right lower quadrant abdominal pain, constipation. EXAM: DG ABDOMEN  ACUTE W/ 1V CHEST COMPARISON:  Radiographs of December 23, 2016. FINDINGS: There is no evidence of dilated bowel loops or free intraperitoneal air. Moderate amount of stool is noted. Phleboliths are noted in the pelvis. Heart size and mediastinal contours are within normal limits. Both lungs are clear. IMPRESSION: No evidence of bowel obstruction or ileus. Moderate stool burden is noted. No acute cardiopulmonary disease. Electronically Signed   By: Marijo Conception, M.D.   On: 07/20/2017 16:19    No results found.  No results found.    Assessment and Plan: Patient Active Problem List   Diagnosis Date Noted  . Mild episode of recurrent major depressive disorder ([place]) 03/23/2018  . Anxiety 03/23/2018  . Taking medication for chronic disease 03/23/2018  . Other constipation 12/13/2017  . Depression with anxiety 08/19/2017  . Hyperlipidemia 08/19/2017  . Gastroesophageal reflux disease without esophagitis 08/19/2017  . Other chest pain 04/16/2015  . COPD exacerbation ([place]) 04/16/2015  . Sleep apnea 04/16/2015  . 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 Asthmatic bronchitis 04/16/2015  . Abdominal pain, LLQ 03/29/2014  . Rectal bleeding 03/29/2014  . 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 Osteoarthrosis, unspecified whether generalized or localized, pelvic region and thigh 03/26/2014    1. Cough we will change inhaler over to trilogy as discussed.  Discontinue other medication.  The other concern is that she may actually have some sinusitis or bronchitis so therefore I am giving her a short course of Zithromax 2. COPD as mention will continue with inhalers over the trilogy 3. OSA state we will continue with CPAP 4. Morbid obesity she needs to work on weight loss  General Counseling: I have discussed the findings of the evaluation and examination with [place].  I have also discussed any further diagnostic evaluation thatmay be needed or ordered today. Missey verbalizes understanding of the  findings of todays visit. We also reviewed her medications today and discussed drug interactions and side effects including but not limited excessive drowsiness and altered mental states. We also discussed that there is always a risk not just to her but also people around her. she has been encouraged to call the office with any questions or concerns that should arise related to todays visit.    Time spent: 5min  I have personally obtained a history, examined the patient, evaluated laboratory and imaging results, formulated the assessment and plan and placed orders.    [name], MD [facility] Pulmonary and Critical Care Sleep medicine

## 2018-03-25 ENCOUNTER — Other Ambulatory Visit: Payer: Self-pay | Admitting: Family Medicine

## 2018-03-25 DIAGNOSIS — J309 Allergic rhinitis, unspecified: Secondary | ICD-10-CM

## 2018-04-07 ENCOUNTER — Ambulatory Visit: Payer: Medicare HMO

## 2018-04-10 DIAGNOSIS — G4733 Obstructive sleep apnea (adult) (pediatric): Secondary | ICD-10-CM | POA: Diagnosis not present

## 2018-04-13 ENCOUNTER — Ambulatory Visit
Admission: RE | Admit: 2018-04-13 | Discharge: 2018-04-13 | Disposition: A | Discharge status: Home / Self Care | Payer: Medicare HMO | Source: Ambulatory Visit | Attending: Family Medicine | Admitting: Family Medicine

## 2018-04-13 DIAGNOSIS — Z1231 Encounter for screening mammogram for malignant neoplasm of breast: Secondary | ICD-10-CM | POA: Insufficient documentation

## 2018-04-13 DIAGNOSIS — Z1239 Encounter for other screening for malignant neoplasm of breast: Secondary | ICD-10-CM

## 2018-04-25 ENCOUNTER — Other Ambulatory Visit: Payer: Self-pay | Admitting: Family Medicine

## 2018-04-25 DIAGNOSIS — E785 Hyperlipidemia, unspecified: Secondary | ICD-10-CM

## 2018-04-25 DIAGNOSIS — K219 Gastro-esophageal reflux disease without esophagitis: Secondary | ICD-10-CM

## 2018-04-25 DIAGNOSIS — J309 Allergic rhinitis, unspecified: Secondary | ICD-10-CM

## 2018-05-02 ENCOUNTER — Other Ambulatory Visit: Payer: Self-pay | Admitting: Family Medicine

## 2018-05-02 ENCOUNTER — Ambulatory Visit: Payer: Self-pay | Admitting: Internal Medicine

## 2018-05-02 DIAGNOSIS — F419 Anxiety disorder, unspecified: Secondary | ICD-10-CM

## 2018-05-02 DIAGNOSIS — F418 Other specified anxiety disorders: Secondary | ICD-10-CM

## 2018-05-16 ENCOUNTER — Other Ambulatory Visit: Payer: Self-pay

## 2018-05-16 ENCOUNTER — Ambulatory Visit (INDEPENDENT_AMBULATORY_CARE_PROVIDER_SITE_OTHER): Payer: Medicare HMO | Admitting: Family Medicine

## 2018-05-16 ENCOUNTER — Encounter: Payer: Self-pay | Admitting: Family Medicine

## 2018-05-16 VITALS — BP 130/64 | HR 72 | Ht 63.0 in | Wt 190.0 lb

## 2018-05-16 DIAGNOSIS — F419 Anxiety disorder, unspecified: Secondary | ICD-10-CM

## 2018-05-16 DIAGNOSIS — W5321XA Bitten by squirrel, initial encounter: Secondary | ICD-10-CM | POA: Diagnosis not present

## 2018-05-16 DIAGNOSIS — S61238A Puncture wound without foreign body of other finger without damage to nail, initial encounter: Secondary | ICD-10-CM

## 2018-05-16 MED ORDER — MUPIROCIN 2 % EX OINT: 1.0000 "application " | TOPICAL_OINTMENT | Freq: Two times a day (BID) | CUTANEOUS | 0 refills | Status: DC

## 2018-05-16 MED ORDER — TRAMADOL HCL 50 MG PO TABS: 50.0000 mg | ORAL_TABLET | Freq: Three times a day (TID) | ORAL | 0 refills | Status: DC | PRN

## 2018-05-16 MED ORDER — AMOXICILLIN-POT CLAVULANATE 875-125 MG PO TABS: 1.0000 | ORAL_TABLET | Freq: Two times a day (BID) | ORAL | 0 refills | Status: DC

## 2018-05-16 NOTE — Progress Notes (Signed)
Name: Regina Macdonald   MRN: [id number]    DOB: November 18, 1940   Date:05/16/2018       Progress Note  Subjective  Chief Complaint  Chief Complaint  Patient presents with  . Animal Bite    pulling squirrel out of dog's mouth and squirrel bit L) index finger in multiple areas- finger is sore and tender to touch. Up to date on Tetanus    Animal Bite   The incident occurred today. The incident occurred at home. There is an injury to the left hand. There is an injury to the left index finger. The pain is mild. Pertinent negatives include no chest pain, no abdominal pain, no nausea, no headaches, no neck pain, no focal weakness, no tingling and no cough.    No problem-specific Assessment & Plan notes found for this encounter.   Past Medical History:  Diagnosis Date  . Anxiety   . Asthma   . 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 COPD (chronic obstructive pulmonary disease) ([place])   . Depression   . Diverticula, colon   . Diverticulosis   . GERD (gastroesophageal reflux disease)   . Sleep apnea     Past Surgical History:  Procedure Laterality Date  . ABDOMINAL HYSTERECTOMY    . COLONOSCOPY  06/08/2013   Dr Candace Cruise- small mouth diverticula  . HIP SURGERY Left   . KNEE SURGERY Bilateral   . NASAL RECONSTRUCTION     x 2    Family History  Problem Relation Age of Onset  . Cirrhosis Father   . Coronary artery disease Mother   . Breast cancer Neg Hx     Social History   Socioeconomic History  . Marital status: Divorced    Spouse name: Not on file  . Number of children: Not on file  . Years of education: Not on file  . Highest education level: Not on file  Occupational History  . Not on file  Social Needs  . Financial resource strain: Not on file  . Food insecurity:    Worry: Not on file    Inability: Not on file  . Transportation needs:    Medical: Not on file    Non-medical: Not on file  Tobacco Use  . Smoking status: Never Smoker  . Smokeless tobacco: Never Used  Substance and  Sexual Activity  . Alcohol use: No    Alcohol/week: 0.0 oz  . Drug use: No  . Sexual activity: Never    Birth control/protection: Post-menopausal  Lifestyle  . Physical activity:    Days per week: Not on file    Minutes per session: Not on file  . Stress: Not on file  Relationships  . Social connections:    Talks on phone: Not on file    Gets together: Not on file    Attends religious service: Not on file    Active member of club or organization: Not on file    Attends meetings of clubs or organizations: Not on file    Relationship status: Not on file  . Intimate partner violence:    Fear of current or ex partner: Not on file    Emotionally abused: Not on file    Physically abused: Not on file    Forced sexual activity: Not on file  Other Topics Concern  . Not on file  Social History Narrative  . Not on file    Allergies  Allergen Reactions  . Ciprofloxacin Other (See Comments)  . Etodolac Other (See  Comments)  . Sulfa Antibiotics Rash    Outpatient Medications Prior to Visit  Medication Sig Dispense Refill  . acetaminophen (TYLENOL) 650 MG CR tablet Take 1 tablet by mouth every 8 (eight) hours.    Marland Kitchen aspirin 81 MG chewable tablet Chew 1 tablet by mouth daily.    Marland Kitchen CALCIUM & MAGNESIUM CARBONATES PO Take 1 tablet by mouth daily.    . cholecalciferol (VITAMIN D) 400 units TABS tablet Take 400 Units by mouth daily.    . Cyanocobalamin (VITAMIN B-12) 1000 MCG SUBL 1 tablet daily.    Marland Kitchen docusate sodium (COLACE) 100 MG capsule Take 1 capsule (100 mg total) by mouth 2 (two) times daily. 60 capsule 5  . fexofenadine (ALLEGRA) 180 MG tablet Take 180 mg by mouth daily.    Marland Kitchen FLUoxetine (PROZAC) 10 MG tablet Take 1 tablet (10 mg total) by mouth 2 (two) times daily. Pt taking 20mg  + 10mg =30mg  in the am and 10mg  in the evening 30 tablet 5  . FLUoxetine (PROZAC) 20 MG capsule Take 20 mg by mouth every morning.    . fluticasone (FLONASE) 50 MCG/ACT nasal spray USE 2 SPRAY(S) IN EACH  NOSTRIL ONCE DAILY 16 g 0  . fluticasone furoate-vilanterol (BREO ELLIPTA) 100-25 MCG/INH AEPB Inhale 1 puff into the lungs daily. 1 each 4  . gemfibrozil (LOPID) 600 MG tablet TAKE 1 TABLET BY MOUTH TWICE DAILY 180 tablet 0  . montelukast (SINGULAIR) 10 MG tablet Take 10 mg by mouth daily. Dr Chancy Milroy    . Multiple Vitamins-Minerals (CENTRUM SILVER PO) Take 1 capsule by mouth daily.    . pantoprazole (PROTONIX) 40 MG tablet TAKE 1 TABLET BY MOUTH ONCE DAILY 90 tablet 0  . polyethylene glycol (MIRALAX) packet Take 17 g by mouth daily. 14 each 0  . FLUoxetine (PROZAC) 20 MG capsule TAKE 1 CAPSULE BY MOUTH TWICE DAILY -  NEED  TO  SCHEDULE  MAY  APPT 60 capsule 0  . azithromycin (ZITHROMAX) 250 MG tablet As directed 6 tablet 0   No facility-administered medications prior to visit.     Review of Systems  Constitutional: Negative for chills, fever, malaise/fatigue and weight loss.  HENT: Negative for ear discharge, ear pain and sore throat.   Eyes: Negative for blurred vision.  Respiratory: Negative for cough, sputum production, shortness of breath and wheezing.   Cardiovascular: Negative for chest pain, palpitations and leg swelling.  Gastrointestinal: Negative for abdominal pain, blood in stool, constipation, diarrhea, heartburn, melena and nausea.  Genitourinary: Negative for dysuria, frequency, hematuria and urgency.  Musculoskeletal: Negative for back pain, joint pain, myalgias and neck pain.  Skin: Negative for rash.  Neurological: Negative for dizziness, tingling, sensory change, focal weakness and headaches.  Endo/Heme/Allergies: Negative for environmental allergies and polydipsia. Does not bruise/bleed easily.  Psychiatric/Behavioral: Negative for depression and suicidal ideas. The patient is not nervous/anxious and does not have insomnia.      Objective  Vitals:   05/16/18 1129  BP: 130/64  Pulse: 72  Weight: 190 lb (86.2 kg)  Height: 5\' 3"  (1.6 m)    Physical Exam    Constitutional: No distress.  HENT:  Head: Normocephalic and atraumatic.  Right Ear: External ear normal.  Left Ear: External ear normal.  Nose: Nose normal.  Mouth/Throat: Oropharynx is clear and moist.  Eyes: Pupils are equal, round, and reactive to light. Conjunctivae and EOM are normal. Right eye exhibits no discharge. Left eye exhibits no discharge.  Neck: Normal range of motion. Neck supple.  No JVD present. No thyromegaly present.  Cardiovascular: Normal rate, regular rhythm, normal heart sounds and intact distal pulses. Exam reveals no gallop and no friction rub.  No murmur heard. Pulmonary/Chest: Effort normal and breath sounds normal. She has no wheezes.  Abdominal: Soft. Bowel sounds are normal. She exhibits no mass. There is no tenderness. There is no guarding.  Musculoskeletal: Normal range of motion. She exhibits no edema.       Left hand: She exhibits normal range of motion, no tenderness and no swelling.       Hands: Lymphadenopathy:    She has no cervical adenopathy.  Neurological: She is alert. She has normal reflexes.  Skin: Skin is warm and dry. No rash noted. She is not diaphoretic. No erythema. No pallor.  Nursing note and vitals reviewed.     Assessment & Plan  Problem List Items Addressed This Visit    None    Visit Diagnoses    Puncture wound of index finger, initial encounter    -  Primary   Acute puncture injury /Tramadol and augmentin given.Tetanus up to date. cleaned and dressed with triple antibiotic.   Relevant Medications   amoxicillin-clavulanate (AUGMENTIN) 875-125 MG tablet   traMADol (ULTRAM) 50 MG tablet   mupirocin ointment (BACTROBAN) 2 %   Wound due to squirrel bite       discussed with health dept /very unlikely rabies circumstance/ will treat for hand infection   Relevant Medications   amoxicillin-clavulanate (AUGMENTIN) 875-125 MG tablet   traMADol (ULTRAM) 50 MG tablet   mupirocin ointment (BACTROBAN) 2 %      Meds ordered  this encounter  Medications  . amoxicillin-clavulanate (AUGMENTIN) 875-125 MG tablet    Sig: Take 1 tablet by mouth 2 (two) times daily.    Dispense:  20 tablet    Refill:  0  . traMADol (ULTRAM) 50 MG tablet    Sig: Take 1 tablet (50 mg total) by mouth every 8 (eight) hours as needed.    Dispense:  30 tablet    Refill:  0  . mupirocin ointment (BACTROBAN) 2 %    Sig: Apply 1 application topically 2 (two) times daily.    Dispense:  22 g    Refill:  0      Dr. Macon [facility]  05/16/18

## 2018-06-03 ENCOUNTER — Other Ambulatory Visit: Payer: Self-pay | Admitting: Family Medicine

## 2018-06-03 DIAGNOSIS — J309 Allergic rhinitis, unspecified: Secondary | ICD-10-CM

## 2018-07-11 ENCOUNTER — Other Ambulatory Visit: Payer: Self-pay | Admitting: Family Medicine

## 2018-07-11 DIAGNOSIS — G4733 Obstructive sleep apnea (adult) (pediatric): Secondary | ICD-10-CM | POA: Diagnosis not present

## 2018-07-11 DIAGNOSIS — K5909 Other constipation: Secondary | ICD-10-CM

## 2018-07-19 ENCOUNTER — Other Ambulatory Visit: Payer: Self-pay | Admitting: Family Medicine

## 2018-07-19 DIAGNOSIS — F419 Anxiety disorder, unspecified: Secondary | ICD-10-CM

## 2018-07-19 DIAGNOSIS — E785 Hyperlipidemia, unspecified: Secondary | ICD-10-CM

## 2018-07-19 DIAGNOSIS — K219 Gastro-esophageal reflux disease without esophagitis: Secondary | ICD-10-CM

## 2018-07-19 DIAGNOSIS — F418 Other specified anxiety disorders: Secondary | ICD-10-CM

## 2018-07-25 ENCOUNTER — Ambulatory Visit: Payer: Self-pay | Admitting: Internal Medicine

## 2018-07-27 ENCOUNTER — Ambulatory Visit: Payer: Self-pay

## 2018-08-02 ENCOUNTER — Ambulatory Visit: Payer: Self-pay | Admitting: Internal Medicine

## 2018-08-03 ENCOUNTER — Ambulatory Visit (INDEPENDENT_AMBULATORY_CARE_PROVIDER_SITE_OTHER): Payer: Medicare HMO

## 2018-08-03 DIAGNOSIS — G4733 Obstructive sleep apnea (adult) (pediatric): Secondary | ICD-10-CM

## 2018-08-03 NOTE — Progress Notes (Signed)
95 percentile pressure    95th percentile leak    apnea index  /hr  apnea-hypopnea index   /hr   total days used  >4 hr  days  total days used <4 hr  days  Total compliance  percent  Data card corrupt, pt is contacting provider for 6 mo DL.

## 2018-08-16 ENCOUNTER — Encounter: Payer: Self-pay | Admitting: Adult Health

## 2018-08-16 ENCOUNTER — Ambulatory Visit: Payer: Medicare HMO | Admitting: Adult Health

## 2018-08-16 VITALS — BP 137/56 | HR 77 | Resp 16 | Ht 63.0 in | Wt 192.6 lb

## 2018-08-16 DIAGNOSIS — R0602 Shortness of breath: Secondary | ICD-10-CM | POA: Diagnosis not present

## 2018-08-16 DIAGNOSIS — G4733 Obstructive sleep apnea (adult) (pediatric): Secondary | ICD-10-CM

## 2018-08-16 DIAGNOSIS — J449 Chronic obstructive pulmonary disease, unspecified: Secondary | ICD-10-CM

## 2018-08-16 MED ORDER — FLUTICASONE FUROATE-VILANTEROL 100-25 MCG/INH IN AEPB: 1.0000 | INHALATION_SPRAY | Freq: Every day | RESPIRATORY_TRACT | 4 refills | Status: DC

## 2018-08-16 NOTE — Progress Notes (Signed)
[facility] [place], [place] [address]  Pulmonary Sleep Medicine   Office Visit Note  Patient Name: Regina Macdonald DOB: [birth year] MRN [id number]  Date of Service: 08/22/2018  Complaints/HPI: Pt here for follow up on OSA and COPD.  She reports wearing her CPAP every night.  She changes her filter, nose piece, tubing and parts regularly.  She cleans the machine regularly with soap and water.  She denies recent hospitalizations, chest pain, palpitations, or sinus issues. She reports sleeping well, and has resolutions of symptoms.  Her copd is well controlled, and she uses Breo inhaler.     ROS  General: (-) fever, (-) chills, (-) night sweats, (-) weakness Skin: (-) rashes, (-) itching,. Eyes: (-) visual changes, (-) redness, (-) itching. Nose and Sinuses: (-) nasal stuffiness or itchiness, (-) postnasal drip, (-) nosebleeds, (-) sinus trouble. Mouth and Throat: (-) sore throat, (-) hoarseness. Neck: (-) swollen glands, (-) enlarged thyroid, (-) neck pain. Respiratory: - cough, (-) bloody sputum, - shortness of breath, - wheezing. Cardiovascular: - ankle swelling, (-) chest pain. Lymphatic: (-) lymph node enlargement. Neurologic: (-) numbness, (-) tingling. Psychiatric: (-) anxiety, (-) depression   Current Medication: Outpatient Encounter Medications as of 08/16/2018  Medication Sig Note  . acetaminophen (TYLENOL) 650 MG CR tablet Take 1 tablet by mouth every 8 (eight) hours. 04/09/2015: Received from: [place]:   . aspirin 81 MG chewable tablet Chew 1 tablet by mouth daily. 04/09/2015: Received from: [place]:   . CALCIUM & MAGNESIUM CARBONATES PO Take 1 tablet by mouth daily. 04/09/2015: Received from: [place]:   . Cyanocobalamin (VITAMIN B-12) 1000 MCG SUBL 1 tablet daily. 04/09/2015: Received from: [place]:   . EQUATE  STOOL SOFTENER 100 MG capsule TAKE 1 CAPSULE BY MOUTH TWICE DAILY   . fexofenadine (ALLEGRA) 180 MG tablet Take 180 mg by mouth daily.   Marland Kitchen FLUoxetine (PROZAC) 10 MG tablet Take 1 tablet (10 mg total) by mouth 2 (two) times daily. Pt taking 38m + 148m30mg in the am and 1053mn the evening   . FLUoxetine (PROZAC) 20 MG capsule Take 20 mg by mouth every morning.   . FMarland KitchenUoxetine (PROZAC) 20 MG capsule TAKE 1 CAPSULE BY MOUTH TWICE DAILY . APPOINTMENT REQUIRED FOR FUTURE REFILLS   . fluticasone (FLONASE) 50 MCG/ACT nasal spray USE 2 SPRAY(S) IN EACH NOSTRIL ONCE DAILY   . fluticasone furoate-vilanterol (BREO ELLIPTA) 100-25 MCG/INH AEPB Inhale 1 puff into the lungs daily.   . gMarland Kitchenmfibrozil (LOPID) 600 MG tablet TAKE 1 TABLET BY MOUTH TWICE DAILY   . Multiple Vitamins-Minerals (CENTRUM SILVER PO) Take 1 capsule by mouth daily.   . pantoprazole (PROTONIX) 40 MG tablet TAKE 1 TABLET BY MOUTH ONCE DAILY   . polyethylene glycol (MIRALAX) packet Take 17 g by mouth daily.   . [DISCONTINUED] fluticasone furoate-vilanterol (BREO ELLIPTA) 100-25 MCG/INH AEPB Inhale 1 puff into the lungs daily.   . aMarland Kitchenoxicillin-clavulanate (AUGMENTIN) 875-125 MG tablet Take 1 tablet by mouth 2 (two) times daily. (Patient not taking: Reported on 08/16/2018)   . cholecalciferol (VITAMIN D) 400 units TABS tablet Take 400 Units by mouth daily.   . montelukast (SINGULAIR) 10 MG tablet Take 10 mg by mouth daily. Dr KahChancy Milroy. mupirocin ointment (BACTROBAN) 2 % Apply 1 application topically 2 (two) times daily. (Patient not taking: Reported on 08/16/2018)   . traMADol (ULVeatrice Bourbon  50 MG tablet Take 1 tablet (50 mg total) by mouth every 8 (eight) hours as needed. (Patient not taking: Reported on 08/16/2018)    No facility-administered encounter medications on file as of 08/16/2018.     Surgical History: Past Surgical History:  Procedure Laterality Date  . ABDOMINAL HYSTERECTOMY    . COLONOSCOPY  06/08/2013   Dr Candace Cruise- small mouth diverticula  .  HIP SURGERY Left   . KNEE SURGERY Bilateral   . NASAL RECONSTRUCTION     x 2    Medical History: Past Medical History:  Diagnosis Date  . Anxiety   . Asthma   . Cancer ([place]) cervical-1973  . COPD (chronic obstructive pulmonary disease) ([place])   . Depression   . Diverticula, colon   . Diverticulosis   . GERD (gastroesophageal reflux disease)   . Sleep apnea     Family History: Family History  Problem Relation Age of Onset  . Cirrhosis Father   . Coronary artery disease Mother   . Breast cancer Neg Hx     Social History: Social History   Socioeconomic History  . Marital status: Divorced    Spouse name: Not on file  . Number of children: Not on file  . Years of education: Not on file  . Highest education level: Not on file  Occupational History  . Not on file  Social Needs  . Financial resource strain: Not on file  . Food insecurity:    Worry: Not on file    Inability: Not on file  . Transportation needs:    Medical: Not on file    Non-medical: Not on file  Tobacco Use  . Smoking status: Never Smoker  . Smokeless tobacco: Never Used  Substance and Sexual Activity  . Alcohol use: No    Alcohol/week: 0.0 standard drinks  . Drug use: No  . Sexual activity: Never    Birth control/protection: Post-menopausal  Lifestyle  . Physical activity:    Days per week: Not on file    Minutes per session: Not on file  . Stress: Not on file  Relationships  . Social connections:    Talks on phone: Not on file    Gets together: Not on file    Attends religious service: Not on file    Active member of club or organization: Not on file    Attends meetings of clubs or organizations: Not on file    Relationship status: Not on file  . Intimate partner violence:    Fear of current or ex partner: Not on file    Emotionally abused: Not on file    Physically abused: Not on file    Forced sexual activity: Not on file  Other Topics Concern  . Not on file  Social History  Narrative  . Not on file    Vital Signs: Blood pressure (!) 137/56, pulse 77, resp. rate 16, height '5\' 3"'  (1.6 m), weight 192 lb 9.6 oz (87.4 kg), SpO2 96 %.  Examination: General Appearance: The patient is well-developed, well-nourished, and in no distress. Skin: Gross inspection of skin unremarkable. Head: normocephalic, no gross deformities. Eyes: no gross deformities noted. ENT: ears appear grossly normal no exudates. Neck: Supple. No thyromegaly. No LAD. Respiratory: no ronchi or rales noted, clear to auscultation. Cardiovascular: Normal S1 and S2 without murmur or rub. Extremities: No cyanosis. pulses are equal. Neurologic: Alert and oriented. No involuntary movements.  LABS: Recent Results (from the past 2160 hour(s))  Basic metabolic panel  Status: Abnormal   Collection Time: 08/17/18  8:01 AM  Result Value Ref Range   Sodium 140 135 - 145 mmol/L   Potassium 4.4 3.5 - 5.1 mmol/L   Chloride 105 98 - 111 mmol/L   CO2 25 22 - 32 mmol/L   Glucose, Bld 111 (H) 70 - 99 mg/dL   BUN 16 8 - 23 mg/dL   Creatinine, Ser 0.64 0.44 - 1.00 mg/dL   Calcium 9.2 8.9 - 10.3 mg/dL   GFR calc non Af Amer >60 >60 mL/min   GFR calc Af Amer >60 >60 mL/min    Comment: (NOTE) The eGFR has been calculated using the CKD EPI equation. This calculation has not been validated in all clinical situations. eGFR's persistently <60 mL/min signify possible Chronic Kidney Disease.    Anion gap 10 5 - 15    Comment: Performed at [facility], [address] [place], [place] [address]  CBC with Differential     Status: Abnormal   Collection Time: 08/17/18  8:01 AM  Result Value Ref Range   WBC 14.7 (H) 3.6 - 11.0 K/uL   RBC 3.67 (L) 3.80 - 5.20 MIL/uL   Hemoglobin 11.5 (L) 12.0 - 16.0 g/dL   HCT 33.1 (L) 35.0 - 47.0 %   MCV 90.3 80.0 - 100.0 fL   MCH 31.4 26.0 - 34.0 pg   MCHC 34.8 32.0 - 36.0 g/dL   RDW 13.6 11.5 - 14.5 %   Platelets 289 150 - 440 K/uL   Neutrophils Relative % 83  %   Neutro Abs 12.3 (H) 1.4 - 6.5 K/uL   Lymphocytes Relative 9 %   Lymphs Abs 1.3 1.0 - 3.6 K/uL   Monocytes Relative 6 %   Monocytes Absolute 0.9 0.2 - 0.9 K/uL   Eosinophils Relative 1 %   Eosinophils Absolute 0.1 0 - 0.7 K/uL   Basophils Relative 1 %   Basophils Absolute 0.1 0 - 0.1 K/uL    Comment: Performed at [facility], [address] [place], [place] [address]    Radiology: Mm Digital Screening  Result Date: 04/13/2018 CLINICAL DATA:  Screening. EXAM: DIGITAL SCREENING BILATERAL MAMMOGRAM WITH CAD COMPARISON:  Previous exam(s). ACR Breast Density Category b: There are scattered areas of fibroglandular density. FINDINGS: There are no findings suspicious for malignancy. Images were processed with CAD. IMPRESSION: No mammographic evidence of malignancy. A result letter of this screening mammogram will be mailed directly to the patient. RECOMMENDATION: Screening mammogram in one year. (Code:SM-B-01Y) BI-RADS CATEGORY  1: Negative. Electronically Signed   By: Lillia Mountain M.D.   On: 04/13/2018 15:23    No results found.  Ct Angio Neck W And/or Wo Contrast  Result Date: 08/17/2018 CLINICAL DATA:  77 year old female with pain radiating from the posterior neck to the upper back and rib cage. Pain with palpation to the left lateral neck. New cough. EXAM: CT ANGIOGRAPHY NECK TECHNIQUE: Multidetector CT imaging of the neck was performed using the standard protocol during bolus administration of intravenous contrast. Multiplanar CT image reconstructions and MIPs were obtained to evaluate the vascular anatomy. Carotid stenosis measurements (when applicable) are obtained utilizing NASCET criteria, using the distal internal carotid diameter as the denominator. CONTRAST:  154m ISOVUE-370 IOPAMIDOL (ISOVUE-370) INJECTION 76% in conjunction with contrast enhanced imaging of the chest reported separately. COMPARISON:  Chest CTA today reported separately. Brain MRI 12/02/2004.  Head CT  11/25/2004. FINDINGS: Skeleton: Absent dentition. Widespread cervical spine degeneration. Ankylosis of the C2-C3 facets with  superimposed mild degenerative appearing anterolisthesis of C3 on C4 and severe right C3-C4 facet degeneration. Advanced lower cervical disc and endplate degeneration. Right greater than left sternoclavicular joint degeneration with degenerative spurring. No acute osseous abnormality identified. Chronic right maxillary sinus disease has progressed since 2006 with periosteal thickening. Overall the visible paranasal sinuses and mastoids are well pneumatized. Upper chest: Chest CTA findings today are reported separately. The central pulmonary arteries appear patent. There is right lung atelectasis. Other neck: Negative.  No neck mass or lymphadenopathy. Negative visualized brain parenchyma. Visualized orbit soft tissues are within normal limits. Aortic arch: 3 vessel arch configuration with mild for age arch atherosclerosis. Right carotid system: No brachiocephalic artery or right CCA origin stenosis. Negative right CCA. Mild soft plaque at the right carotid bifurcation without stenosis. Negative cervical right ICA aside from tortuosity. Visible right ICA siphon is patent with mild to moderate calcified plaque. Left carotid system: Normal left CCA origin. Moderately tortuous left CCA with a partially retropharyngeal course. Minimal soft plaque at the left carotid bifurcation without stenosis. Mildly tortuous cervical left ICA without stenosis. Patent visible left ICA siphon with mild to moderate calcified plaque. Vertebral arteries: No proximal right subclavian or right vertebral artery origin stenosis. Tortuous right V1 segment. Patent right vertebral artery to the vertebrobasilar junction with no stenosis. Normal right PICA origin. Patent visible basilar artery without stenosis. No significant proximal left subclavian artery stenosis despite soft and calcified plaque (series 8, image 144).  Normal left vertebral artery origin. Tortuous left V1 segment. Codominant vertebral arteries. The left is patent without stenosis to the vertebrobasilar. Normal junction left PICA origin. Review of the MIP images confirms the above findings IMPRESSION: 1. Normal for age cervical carotid and vertebral arteries. Minimal atherosclerosis in the neck. Proximal left subclavian artery atherosclerosis without hemodynamically significant stenosis. 2. Advanced cervical spine degeneration, but no acute finding in the neck. 3. CTA Chest today is reported separately. Electronically Signed   By: Genevie Ann M.D.   On: 08/17/2018 09:18   Ct Angio Chest Aorta W And/or Wo Contrast  Result Date: 08/17/2018 CLINICAL DATA:  Posterior neck and upper back pain, new cough, history of COPD and cervical carcinoma EXAM: CT ANGIOGRAPHY CHEST WITH CONTRAST TECHNIQUE: Multidetector CT imaging of the chest was performed using the standard protocol during bolus administration of intravenous contrast. Multiplanar CT image reconstructions and MIPs were obtained to evaluate the vascular anatomy. CONTRAST:  147m ISOVUE-370 IOPAMIDOL (ISOVUE-370) INJECTION 76% COMPARISON:  Chest x-ray 07/20/2017 and 12/23/2016 FINDINGS: Cardiovascular: There is good opacification of the thoracic aorta. No acute thoracic abnormality is seen. The mid ascending thoracic aorta measures 32 mm in diameter. There are faint coronary artery calcifications primarily in the distribution of the left anterior descending and circumflex coronary arteries. The heart is mildly enlarged. No pericardial effusion is seen. Mitral annular calcifications are present. The pulmonary arteries are faintly opacified with no central abnormality evident. Mediastinum/Nodes: No mediastinal or hilar adenopathy is seen. Only small mediastinal lymph nodes are present. The thyroid gland is unremarkable. No abnormality of the thoracic aorta is seen. Lungs/Pleura: Both hemidiaphragms are somewhat  elevated right more so than left resulting in basilar atelectasis right greater than left. However no pleural effusion is seen and there is no evidence of pneumonia. No suspicious pulmonary nodule is evident. The central airway is patent. Upper Abdomen: The liver appears somewhat low in attenuation suggesting hepatic steatosis. In addition there is a least 1 gallstone within the gallbladder layering within the fundus. The  gallbladder wall is not thickened. Within the tail the pancreas there is a somewhat more solid-appearing nodular area of 11 mm in diameter. This may simply represent parenchymal nodularity with some fatty infiltration, but MRI may be helpful last follow-up to assess for a small pancreatic tail lesion. The pancreatic duct is not dilated. Musculoskeletal: There are degenerative changes in the mid to lower thoracic spine. No compression deformity is seen. Review of the MIP images confirms the above findings. IMPRESSION: 1. No acute abnormality of the thoracic aorta is seen. Mild cardiomegaly with faint calcification within the left anterior descending and circumflex coronary arteries. 2. Bibasilar atelectasis right greater than left with chronic elevation of the right hemidiaphragm. 3. Small solid-appearing nodularity within the tail of the pancreas of uncertain significance measuring 11 mm in diameter. Recommend MRI to exclude a developing pancreatic tail lesion. 4. Hepatic steatosis. 5. At least 1 gallstone within the gallbladder. Electronically Signed   By: Ivar Drape M.D.   On: 08/17/2018 09:20      Assessment and Plan: Patient Active Problem List   Diagnosis Date Noted  . Mild episode of recurrent major depressive disorder ([place]) 03/23/2018  . Anxiety 03/23/2018  . Taking medication for chronic disease 03/23/2018  . Other constipation 12/13/2017  . Depression with anxiety 08/19/2017  . Hyperlipidemia 08/19/2017  . Gastroesophageal reflux disease without esophagitis 08/19/2017  .  Other chest pain 04/16/2015  . COPD exacerbation ([place]) 04/16/2015  . Sleep apnea 04/16/2015  . 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 Asthmatic bronchitis 04/16/2015  . Abdominal pain, LLQ 03/29/2014  . Rectal bleeding 03/29/2014  . 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 Osteoarthrosis, unspecified whether generalized or localized, pelvic region and thigh 03/26/2014    1. Obstructive chronic bronchitis without exacerbation ([place]) Refilled Breo.  Continue using Breo as prescribed.  COPD is well controlled at this time.  - fluticasone furoate-vilanterol (BREO ELLIPTA) 100-25 MCG/INH AEPB; Inhale 1 puff into the lungs daily.  Dispense: 1 each; Refill: 4  2. OSA (obstructive sleep apnea) Good compliance. Continue using CPAP as directed.     3. Morbid obesity ([place]) Obesity Counseling: Risk Assessment: An assessment of behavioral risk factors was made today and includes lack of exercise sedentary lifestyle, lack of portion control and poor dietary habits.  Risk Modification Advice: She was counseled on portion control guidelines. Restricting daily caloric intake to. . The detrimental long term effects of obesity on her health and ongoing poor compliance was also discussed with the patient.    General Counseling: I have discussed the findings of the evaluation and examination with [place].  I have also discussed any further diagnostic evaluation thatmay be needed or ordered today. Detrice verbalizes understanding of the findings of todays visit. We also reviewed her medications today and discussed drug interactions and side effects including but not limited excessive drowsiness and altered mental states. We also discussed that there is always a risk not just to her but also people around her. she has been encouraged to call the office with any questions or concerns that should arise related to todays visit.    Time spent: This patient was seen by Orson Gear AGNP-C in Collaboration with Dr.  Devona Konig as a part of collaborative care agreement.  I have personally obtained a history, examined the patient, evaluated laboratory and imaging results, formulated the assessment and plan and placed orders.    [name], MD [facility] Pulmonary and Critical Care Sleep medicine

## 2018-08-16 NOTE — Patient Instructions (Signed)
Chronic Obstructive Pulmonary Disease Chronic obstructive pulmonary disease (COPD) is a long-term (chronic) lung problem. When you have COPD, it is hard for air to get in and out of your lungs. The way your lungs work will never return to normal. Usually the condition gets worse over time. There are things you can do to keep yourself as healthy as possible. Your doctor may treat your condition with:  Medicines.  Quitting smoking, if you smoke.  Rehabilitation. This may involve a team of specialists.  Oxygen.  Exercise and changes to your diet.  Lung surgery.  Comfort measures (palliative care).  Follow these instructions at home: Medicines  Take over-the-counter and prescription medicines only as told by your doctor.  Talk to your doctor before taking any cough or allergy medicines. You may need to avoid medicines that cause your lungs to be dry. Lifestyle  If you smoke, stop. Smoking makes the problem worse. If you need help quitting, ask your doctor.  Avoid being around things that make your breathing worse. This may include smoke, chemicals, and fumes.  Stay active, but remember to also rest.  Learn and use tips on how to relax.  Make sure you get enough sleep. Most adults need at least 7 hours a night.  Eat healthy foods. Eat smaller meals more often. Rest before meals. Controlled breathing  Learn and use tips on how to control your breathing as told by your doctor. Try: ? Breathing in (inhaling) through your nose for 1 second. Then, pucker your lips and breath out (exhale) through your lips for 2 seconds. ? Putting one hand on your belly (abdomen). Breathe in slowly through your nose for 1 second. Your hand on your belly should move out. Pucker your lips and breathe out slowly through your lips. Your hand on your belly should move in as you breathe out. Controlled coughing  Learn and use controlled coughing to clear mucus from your lungs. The steps are: 1. Lean your  head a little forward. 2. Breathe in deeply. 3. Try to hold your breath for 3 seconds. 4. Keep your mouth slightly open while coughing 2 times. 5. Spit any mucus out into a tissue. 6. Rest and do the steps again 1 or 2 times as needed. General instructions  Make sure you get all the shots (vaccines) that your doctor recommends. Ask your doctor about a flu shot and a pneumonia shot.  Use oxygen therapy and therapy to help improve your lungs (pulmonary rehabilitation) if told by your doctor. If you need home oxygen therapy, ask your doctor if you should buy a tool to measure your oxygen level (oximeter).  Make a COPD action plan with your doctor. This helps you know what to do if you feel worse than usual.  Manage any other conditions you have as told by your doctor.  Avoid going outside when it is very hot, cold, or humid.  Avoid people who have a sickness you can catch (contagious).  Keep all follow-up visits as told by your doctor. This is important. Contact a doctor if:  You cough up more mucus than usual.  There is a change in the color or thickness of the mucus.  It is harder to breathe than usual.  Your breathing is faster than usual.  You have trouble sleeping.  You need to use your medicines more often than usual.  You have trouble doing your normal activities such as getting dressed or walking around the house. Get help right away if:    You have shortness of breath while resting.  You have shortness of breath that stops you from: ? Being able to talk. ? Doing normal activities.  Your chest hurts for longer than 5 minutes.  Your skin color is more blue than usual.  Your pulse oximeter shows that you have low oxygen for longer than 5 minutes.  You have a fever.  You feel too tired to breathe normally. Summary  Chronic obstructive pulmonary disease (COPD) is a long-term lung problem.  The way your lungs work will never return to normal. Usually the  condition gets worse over time. There are things you can do to keep yourself as healthy as possible.  Take over-the-counter and prescription medicines only as told by your doctor.  If you smoke, stop. Smoking makes the problem worse. This information is not intended to replace advice given to you by your health care provider. Make sure you discuss any questions you have with your health care provider. Document Released: 04/20/2008 Document Revised: 04/09/2016 Document Reviewed: 06/29/2013 Elsevier Interactive Patient Education  2017 [employer] Sleep Apnea Sleep apnea is a condition that affects breathing. People with sleep apnea have moments during sleep when their breathing pauses briefly or gets shallow. Sleep apnea can cause these symptoms:  Trouble staying asleep.  Sleepiness or tiredness during the day.  Irritability.  Loud snoring.  Morning headaches.  Trouble concentrating.  Forgetting things.  Less interest in sex.  Being sleepy for no reason.  Mood swings.  Personality changes.  Depression.  Waking up a lot during the night to pee (urinate).  Dry mouth.  Sore throat.  Follow these instructions at home:  Make any changes in your routine that your doctor recommends.  Eat a healthy, well-balanced diet.  Take over-the-counter and prescription medicines only as told by your doctor.  Avoid using alcohol, calming medicines (sedatives), and narcotic medicines.  Take steps to lose weight if you are overweight.  If you were given a machine (device) to use while you sleep, use it only as told by your doctor.  Do not use any tobacco products, such as cigarettes, chewing tobacco, and e-cigarettes. If you need help quitting, ask your doctor.  Keep all follow-up visits as told by your doctor. This is important. Contact a doctor if:  The machine that you were given to use during sleep is uncomfortable or does not seem to be working.  Your symptoms do not get  better.  Your symptoms get worse. Get help right away if:  Your chest hurts.  You have trouble breathing in enough air (shortness of breath).  You have an uncomfortable feeling in your back, arms, or stomach.  You have trouble talking.  One side of your body feels weak.  A part of your face is hanging down (drooping). These symptoms may be an emergency. Do not wait to see if the symptoms will go away. Get medical help right away. Call your local emergency services (911 in the [place] Do not drive yourself to the hospital. This information is not intended to replace advice given to you by your health care provider. Make sure you discuss any questions you have with your health care provider. Document Released: 08/11/2008 Document Revised: 06/28/2016 Document Reviewed: 08/12/2015 Elsevier Interactive Patient Education  [employer].

## 2018-08-17 ENCOUNTER — Emergency Department: Payer: Medicare HMO

## 2018-08-17 ENCOUNTER — Emergency Department
Admission: EM | Admit: 2018-08-17 | Discharge: 2018-08-17 | Disposition: A | Discharge status: Home / Self Care | Payer: Medicare HMO | Attending: Emergency Medicine | Admitting: Emergency Medicine

## 2018-08-17 ENCOUNTER — Other Ambulatory Visit: Payer: Self-pay

## 2018-08-17 ENCOUNTER — Encounter: Payer: Self-pay | Admitting: Radiology

## 2018-08-17 DIAGNOSIS — R05 Cough: Secondary | ICD-10-CM | POA: Diagnosis not present

## 2018-08-17 DIAGNOSIS — J449 Chronic obstructive pulmonary disease, unspecified: Secondary | ICD-10-CM | POA: Diagnosis not present

## 2018-08-17 DIAGNOSIS — I259 Chronic ischemic heart disease, unspecified: Secondary | ICD-10-CM | POA: Diagnosis not present

## 2018-08-17 DIAGNOSIS — M62838 Other muscle spasm: Secondary | ICD-10-CM | POA: Diagnosis not present

## 2018-08-17 DIAGNOSIS — Z7982 Long term (current) use of aspirin: Secondary | ICD-10-CM | POA: Diagnosis not present

## 2018-08-17 DIAGNOSIS — R079 Chest pain, unspecified: Secondary | ICD-10-CM | POA: Diagnosis not present

## 2018-08-17 DIAGNOSIS — R0789 Other chest pain: Secondary | ICD-10-CM | POA: Diagnosis not present

## 2018-08-17 DIAGNOSIS — Z79899 Other long term (current) drug therapy: Secondary | ICD-10-CM | POA: Diagnosis not present

## 2018-08-17 DIAGNOSIS — M542 Cervicalgia: Secondary | ICD-10-CM | POA: Diagnosis not present

## 2018-08-17 LAB — BASIC METABOLIC PANEL
ANION GAP: 10 (ref 5–15)
BUN: 16 mg/dL (ref 8–23)
CALCIUM: 9.2 mg/dL (ref 8.9–10.3)
CO2: 25 mmol/L (ref 22–32)
Chloride: 105 mmol/L (ref 98–111)
Creatinine, Ser: 0.64 mg/dL (ref 0.44–1.00)
GFR calc non Af Amer: >60 mL/min (ref >60)
Glucose, Bld: 111 mg/dL — ABNORMAL HIGH (ref 70–99)
Potassium: 4.4 mmol/L (ref 3.5–5.1)
SODIUM: 140 mmol/L (ref 135–145)

## 2018-08-17 LAB — CBC WITH DIFFERENTIAL/PLATELET
BASOS ABS: 0.1 10*3/uL (ref 0–0.1)
BASOS PCT: 1 %
Eosinophils Absolute: 0.1 10*3/uL (ref 0–0.7)
Eosinophils Relative: 1 %
HCT: 33.1 % — ABNORMAL LOW (ref 35.0–47.0)
HEMOGLOBIN: 11.5 g/dL — AB (ref 12.0–16.0)
Lymphocytes Relative: 9 %
Lymphs Abs: 1.3 10*3/uL (ref 1.0–3.6)
MCH: 31.4 pg (ref 26.0–34.0)
MCHC: 34.8 g/dL (ref 32.0–36.0)
MCV: 90.3 fL (ref 80.0–100.0)
Monocytes Absolute: 0.9 10*3/uL (ref 0.2–0.9)
Monocytes Relative: 6 %
NEUTROS ABS: 12.3 10*3/uL — AB (ref 1.4–6.5)
NEUTROS PCT: 83 %
Platelets: 289 10*3/uL (ref 150–440)
RBC: 3.67 MIL/uL — AB (ref 3.80–5.20)
RDW: 13.6 % (ref 11.5–14.5)
WBC: 14.7 10*3/uL — AB (ref 3.6–11.0)

## 2018-08-17 MED ORDER — IPRATROPIUM-ALBUTEROL 0.5-2.5 (3) MG/3ML IN SOLN
3.0000 mL | Freq: Once | RESPIRATORY_TRACT | Status: AC
  Administered 2018-08-17: 3 mL via RESPIRATORY_TRACT

## 2018-08-17 MED ORDER — LIDOCAINE 5 % EX PTCH: 1.0000 | MEDICATED_PATCH | Freq: Two times a day (BID) | CUTANEOUS | 0 refills | Status: DC

## 2018-08-17 MED ORDER — IOPAMIDOL (ISOVUE-370) INJECTION 76%
125.0000 mL | Freq: Once | INTRAVENOUS | Status: AC | PRN
  Administered 2018-08-17: 125 mL via INTRAVENOUS

## 2018-08-17 MED ORDER — NAPROXEN 500 MG PO TABS: 500.0000 mg | ORAL_TABLET | Freq: Two times a day (BID) | ORAL | 0 refills | Status: DC

## 2018-08-17 MED ORDER — IPRATROPIUM-ALBUTEROL 0.5-2.5 (3) MG/3ML IN SOLN
RESPIRATORY_TRACT | Status: AC
  Filled 2018-08-17: qty 3

## 2018-08-17 MED ORDER — DIAZEPAM 5 MG PO TABS: 5.0000 mg | ORAL_TABLET | Freq: Two times a day (BID) | ORAL | 0 refills | Status: DC | PRN

## 2018-08-17 MED ORDER — LORAZEPAM 2 MG/ML IJ SOLN
1.0000 mg | Freq: Once | INTRAMUSCULAR | Status: AC
  Administered 2018-08-17: 1 mg via INTRAVENOUS
  Filled 2018-08-17: qty 1

## 2018-08-17 MED ORDER — KETOROLAC TROMETHAMINE 30 MG/ML IJ SOLN
15.0000 mg | INTRAMUSCULAR | Status: AC
  Administered 2018-08-17: 15 mg via INTRAVENOUS
  Filled 2018-08-17: qty 1

## 2018-08-17 NOTE — ED Notes (Signed)
Report to ashley, rn

## 2018-08-17 NOTE — ED Triage Notes (Signed)
Pt woke up with neck pain tonight, no hx of the same or injury.

## 2018-08-17 NOTE — Discharge Instructions (Addendum)
Your CT scan of the chest and neck do not show any acute issues.  There is a small 11 mm nodule at the tail of the pancreas.  You should follow-up with your primary care doctor to arrange an MRI for further evaluation.

## 2018-08-17 NOTE — ED Notes (Signed)
Pt complains of pain extending from posterior neck to upper back around to upper rib cage. Pt states pain with palpation of left lateral neck and sternocledomastiod muscle by rn.

## 2018-08-17 NOTE — ED Provider Notes (Signed)
[facility] Emergency Department Provider Note  ____________________________________________  Time seen: Approximately 1:03 PM  I have reviewed the triage vital signs and the nursing notes.   HISTORY  Chief Complaint Neck Pain    HPI Regina Macdonald is a 77 y.o. female with a history of anxiety COPD depression GERD who complains of pain from the posterior neck radiating to the central chest that started on waking up at about 5:30 AM today.  Sudden onset, severe, 10/10, sharp, worse laying back, better sitting upright and being still.  Now about 8/10 in intensity.  Never had anything like this before.  No recent heavy lifting falls or other potential musculoskeletal injuries that she knows of.  No overhead working.   Chest pain is not exertional, not pleuritic.  No shortness of breath vomiting or diaphoresis.   Past Medical History:  Diagnosis Date  . Anxiety   . Asthma   . Cancer ([place]) cervical-1973  . COPD (chronic obstructive pulmonary disease) ([place])   . Depression   . Diverticula, colon   . Diverticulosis   . GERD (gastroesophageal reflux disease)   . Sleep apnea      Patient Active Problem List   Diagnosis Date Noted  . Mild episode of recurrent major depressive disorder ([place]) 03/23/2018  . Anxiety 03/23/2018  . Taking medication for chronic disease 03/23/2018  . Other constipation 12/13/2017  . Depression with anxiety 08/19/2017  . Hyperlipidemia 08/19/2017  . Gastroesophageal reflux disease without esophagitis 08/19/2017  . Other chest pain 04/16/2015  . COPD exacerbation ([place]) 04/16/2015  . Sleep apnea 04/16/2015  . 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 Asthmatic bronchitis 04/16/2015  . Abdominal pain, LLQ 03/29/2014  . Rectal bleeding 03/29/2014  . 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 Osteoarthrosis, unspecified whether generalized or localized, pelvic region and thigh 03/26/2014     Past Surgical History:  Procedure Laterality  Date  . ABDOMINAL HYSTERECTOMY    . COLONOSCOPY  06/08/2013   Dr Candace Cruise- small mouth diverticula  . HIP SURGERY Left   . KNEE SURGERY Bilateral   . NASAL RECONSTRUCTION     x 2     Prior to Admission medications   Medication Sig Start Date End Date Taking? Authorizing Provider  acetaminophen (TYLENOL) 650 MG CR tablet Take 1 tablet by mouth every 8 (eight) hours.    [provider]  amoxicillin-clavulanate (AUGMENTIN) 875-125 MG tablet Take 1 tablet by mouth 2 (two) times daily. Patient not taking: Reported on 08/16/2018 05/16/18   Juline Patch, MD  aspirin 81 MG chewable tablet Chew 1 tablet by mouth daily.    [provider]  CALCIUM & MAGNESIUM CARBONATES PO Take 1 tablet by mouth daily.    [provider]  cholecalciferol (VITAMIN D) 400 units TABS tablet Take 400 Units by mouth daily.    [provider]  Cyanocobalamin (VITAMIN B-12) 1000 MCG SUBL 1 tablet daily. 08/31/14   [provider]  diazepam (VALIUM) 5 MG tablet Take 1 tablet (5 mg total) by mouth 2 (two) times daily as needed for muscle spasms. 08/17/18   Carrie Mew, MD  EQUATE STOOL SOFTENER 100 MG capsule TAKE 1 CAPSULE BY MOUTH TWICE DAILY 07/11/18   Juline Patch, MD  fexofenadine (ALLEGRA) 180 MG tablet Take 180 mg by mouth daily.    [provider]  FLUoxetine (PROZAC) 10 MG tablet Take 1 tablet (10 mg total) by mouth 2 (two) times daily. Pt taking 20mg  + 10mg =30mg  in the  am and 10mg  in the evening 03/23/18   Juline Patch, MD  FLUoxetine (PROZAC) 20 MG capsule Take 20 mg by mouth every morning.    [provider]  FLUoxetine (PROZAC) 20 MG capsule TAKE 1 CAPSULE BY MOUTH TWICE DAILY . APPOINTMENT REQUIRED FOR FUTURE REFILLS 07/19/18   Juline Patch, MD  fluticasone (FLONASE) 50 MCG/ACT nasal spray USE 2 SPRAY(S) IN EACH NOSTRIL ONCE DAILY 06/03/18   Juline Patch, MD  fluticasone furoate-vilanterol (BREO ELLIPTA) 100-25 MCG/INH AEPB Inhale 1 puff into  the lungs daily. 08/16/18   [place]ar[place], Audie Clear, NP  gemfibrozil (LOPID) 600 MG tablet TAKE 1 TABLET BY MOUTH TWICE DAILY 07/19/18   Juline Patch, MD  lidocaine (LIDODERM) 5 % Place 1 patch onto the skin every 12 (twelve) hours. Remove & Discard patch within 12 hours or as directed by MD 08/17/18   Carrie Mew, MD  montelukast (SINGULAIR) 10 MG tablet Take 10 mg by mouth daily. Dr Chancy Milroy    [provider]  Multiple Vitamins-Minerals (CENTRUM SILVER PO) Take 1 capsule by mouth daily.    [provider]  mupirocin ointment (BACTROBAN) 2 % Apply 1 application topically 2 (two) times daily. Patient not taking: Reported on 08/16/2018 05/16/18   Juline Patch, MD  naproxen (NAPROSYN) 500 MG tablet Take 1 tablet (500 mg total) by mouth 2 (two) times daily with a meal. 08/17/18   Carrie Mew, MD  pantoprazole (PROTONIX) 40 MG tablet TAKE 1 TABLET BY MOUTH ONCE DAILY 07/19/18   Juline Patch, MD  polyethylene glycol (MIRALAX) packet Take 17 g by mouth daily. 07/20/17   Schuyler Amor, MD  traMADol (ULTRAM) 50 MG tablet Take 1 tablet (50 mg total) by mouth every 8 (eight) hours as needed. Patient not taking: Reported on 08/16/2018 05/16/18   Juline Patch, MD     Allergies Ciprofloxacin; Etodolac; and Sulfa antibiotics   Family History  Problem Relation Age of Onset  . Cirrhosis Father   . Coronary artery disease Mother   . Breast cancer Neg Hx     Social History Social History   Tobacco Use  . Smoking status: Never Smoker  . Smokeless tobacco: Never Used  Substance Use Topics  . Alcohol use: No    Alcohol/week: 0.0 standard drinks  . Drug use: No    Review of Systems  Constitutional:   No fever or chills.  ENT:   No sore throat. No rhinorrhea. Cardiovascular: Positive chest pain as above without syncope. Respiratory:   No dyspnea or cough. Gastrointestinal:   Negative for abdominal pain, vomiting and diarrhea.  Musculoskeletal:   Neck pain and chest pain as  above All other systems reviewed and are negative except as documented above in ROS and HPI.  ____________________________________________   PHYSICAL EXAM:  VITAL SIGNS: ED Triage Vitals  Enc Vitals Group     BP 08/17/18 0436 (!) 126/33     Pulse Rate 08/17/18 0436 68     Resp 08/17/18 0436 19     Temp 08/17/18 0436 97.6 F (36.4 C)     Temp Source 08/17/18 0436 Oral     SpO2 08/17/18 0436 97 %     Weight 08/17/18 0435 192 lb (87.1 kg)     Height 08/17/18 0435 5\' 3"  (1.6 m)     Head Circumference --      Peak Flow --      Pain Score 08/17/18 0434 5     Pain  Loc --      Pain Edu? --      Excl. in [place]? --     Vital signs reviewed, nursing assessments reviewed.   Constitutional:   Alert and oriented. Non-toxic appearance. Eyes:   Conjunctivae are normal. EOMI. PERRL. ENT      Head:   Normocephalic and atraumatic.      Nose:   No congestion/rhinnorhea.       Mouth/Throat:   MMM, no pharyngeal erythema. No peritonsillar mass.       Neck:   No meningismus. Full ROM.  Posteriorly along the midline of the neck and upper thoracic spine. Hematological/Lymphatic/Immunilogical:   No cervical lymphadenopathy. Cardiovascular:   RRR. Symmetric bilateral radial and DP pulses.  No murmurs. Cap refill less than 2 seconds. Respiratory:   Normal respiratory effort without tachypnea/retractions. Breath sounds are clear and equal bilaterally. No wheezes/rales/rhonchi. Gastrointestinal:   Soft and nontender. Non distended. There is no CVA tenderness.  No rebound, rigidity, or guarding.  Musculoskeletal:   Normal range of motion in all extremities. No joint effusions.  No lower extremity tenderness.  No edema.  There is midline tenderness of the upper thoracic and lower cervical spine.  There is also muscular tenderness in the left posterior thoracic paraspinous musculature in the area of the rhomboids. Neurologic:   Normal speech and language.  Motor grossly intact. No acute focal neurologic  deficits are appreciated.  Skin:    Skin is warm, dry and intact. No rash noted.  No petechiae, purpura, or bullae.  ____________________________________________    LABS (pertinent positives/negatives) (all labs ordered are listed, but only abnormal results are displayed) Labs Reviewed  BASIC METABOLIC PANEL - Abnormal; Notable for the following components:      Result Value   Glucose, Bld 111 (*)    All other components within normal limits  CBC WITH DIFFERENTIAL/PLATELET - Abnormal; Notable for the following components:   WBC 14.7 (*)    RBC 3.67 (*)    Hemoglobin 11.5 (*)    HCT 33.1 (*)    Neutro Abs 12.3 (*)    All other components within normal limits   ____________________________________________   EKG Interpreted by me  Date: 08/17/2018  Rate: 64  Rhythm: normal sinus rhythm  QRS Axis: normal  Intervals: normal  ST/T Wave abnormalities: normal  Conduction Disutrbances: none  Narrative Interpretation: unremarkable      ____________________________________________    RADIOLOGY  Ct Angio Neck W And/or Wo Contrast  Result Date: 08/17/2018 CLINICAL DATA:  77 year old female with pain radiating from the posterior neck to the upper back and rib cage. Pain with palpation to the left lateral neck. New cough. EXAM: CT ANGIOGRAPHY NECK TECHNIQUE: Multidetector CT imaging of the neck was performed using the standard protocol during bolus administration of intravenous contrast. Multiplanar CT image reconstructions and MIPs were obtained to evaluate the vascular anatomy. Carotid stenosis measurements (when applicable) are obtained utilizing NASCET criteria, using the distal internal carotid diameter as the denominator. CONTRAST:  150mL ISOVUE-370 IOPAMIDOL (ISOVUE-370) INJECTION 76% in conjunction with contrast enhanced imaging of the chest reported separately. COMPARISON:  Chest CTA today reported separately. Brain MRI 12/02/2004.  Head CT 11/25/2004. FINDINGS: Skeleton:  Absent dentition. Widespread cervical spine degeneration. Ankylosis of the C2-C3 facets with superimposed mild degenerative appearing anterolisthesis of C3 on C4 and severe right C3-C4 facet degeneration. Advanced lower cervical disc and endplate degeneration. Right greater than left sternoclavicular joint degeneration with degenerative spurring. No acute osseous abnormality identified. Chronic  right maxillary sinus disease has progressed since 2006 with periosteal thickening. Overall the visible paranasal sinuses and mastoids are well pneumatized. Upper chest: Chest CTA findings today are reported separately. The central pulmonary arteries appear patent. There is right lung atelectasis. Other neck: Negative.  No neck mass or lymphadenopathy. Negative visualized brain parenchyma. Visualized orbit soft tissues are within normal limits. Aortic arch: 3 vessel arch configuration with mild for age arch atherosclerosis. Right carotid system: No brachiocephalic artery or right CCA origin stenosis. Negative right CCA. Mild soft plaque at the right carotid bifurcation without stenosis. Negative cervical right ICA aside from tortuosity. Visible right ICA siphon is patent with mild to moderate calcified plaque. Left carotid system: Normal left CCA origin. Moderately tortuous left CCA with a partially retropharyngeal course. Minimal soft plaque at the left carotid bifurcation without stenosis. Mildly tortuous cervical left ICA without stenosis. Patent visible left ICA siphon with mild to moderate calcified plaque. Vertebral arteries: No proximal right subclavian or right vertebral artery origin stenosis. Tortuous right V1 segment. Patent right vertebral artery to the vertebrobasilar junction with no stenosis. Normal right PICA origin. Patent visible basilar artery without stenosis. No significant proximal left subclavian artery stenosis despite soft and calcified plaque (series 8, image 144). Normal left vertebral artery  origin. Tortuous left V1 segment. Codominant vertebral arteries. The left is patent without stenosis to the vertebrobasilar. Normal junction left PICA origin. Review of the MIP images confirms the above findings IMPRESSION: 1. Normal for age cervical carotid and vertebral arteries. Minimal atherosclerosis in the neck. Proximal left subclavian artery atherosclerosis without hemodynamically significant stenosis. 2. Advanced cervical spine degeneration, but no acute finding in the neck. 3. CTA Chest today is reported separately. Electronically Signed   By: Genevie Ann M.D.   On: 08/17/2018 09:18   Ct Angio Chest Aorta W And/or Wo Contrast  Result Date: 08/17/2018 CLINICAL DATA:  Posterior neck and upper back pain, new cough, history of COPD and cervical carcinoma EXAM: CT ANGIOGRAPHY CHEST WITH CONTRAST TECHNIQUE: Multidetector CT imaging of the chest was performed using the standard protocol during bolus administration of intravenous contrast. Multiplanar CT image reconstructions and MIPs were obtained to evaluate the vascular anatomy. CONTRAST:  139mL ISOVUE-370 IOPAMIDOL (ISOVUE-370) INJECTION 76% COMPARISON:  Chest x-ray 07/20/2017 and 12/23/2016 FINDINGS: Cardiovascular: There is good opacification of the thoracic aorta. No acute thoracic abnormality is seen. The mid ascending thoracic aorta measures 32 mm in diameter. There are faint coronary artery calcifications primarily in the distribution of the left anterior descending and circumflex coronary arteries. The heart is mildly enlarged. No pericardial effusion is seen. Mitral annular calcifications are present. The pulmonary arteries are faintly opacified with no central abnormality evident. Mediastinum/Nodes: No mediastinal or hilar adenopathy is seen. Only small mediastinal lymph nodes are present. The thyroid gland is unremarkable. No abnormality of the thoracic aorta is seen. Lungs/Pleura: Both hemidiaphragms are somewhat elevated right more so than left  resulting in basilar atelectasis right greater than left. However no pleural effusion is seen and there is no evidence of pneumonia. No suspicious pulmonary nodule is evident. The central airway is patent. Upper Abdomen: The liver appears somewhat low in attenuation suggesting hepatic steatosis. In addition there is a least 1 gallstone within the gallbladder layering within the fundus. The gallbladder wall is not thickened. Within the tail the pancreas there is a somewhat more solid-appearing nodular area of 11 mm in diameter. This may simply represent parenchymal nodularity with some fatty infiltration, but MRI may be helpful  last follow-up to assess for a small pancreatic tail lesion. The pancreatic duct is not dilated. Musculoskeletal: There are degenerative changes in the mid to lower thoracic spine. No compression deformity is seen. Review of the MIP images confirms the above findings. IMPRESSION: 1. No acute abnormality of the thoracic aorta is seen. Mild cardiomegaly with faint calcification within the left anterior descending and circumflex coronary arteries. 2. Bibasilar atelectasis right greater than left with chronic elevation of the right hemidiaphragm. 3. Small solid-appearing nodularity within the tail of the pancreas of uncertain significance measuring 11 mm in diameter. Recommend MRI to exclude a developing pancreatic tail lesion. 4. Hepatic steatosis. 5. At least 1 gallstone within the gallbladder. Electronically Signed   By: Ivar Drape M.D.   On: 08/17/2018 09:20    ____________________________________________   PROCEDURES Procedures  ____________________________________________  DIFFERENTIAL DIAGNOSIS   Carotid dissection, vertebral dissection, ascending aortic dissection, muscle spasm  CLINICAL IMPRESSION / ASSESSMENT AND PLAN / ED COURSE  Pertinent labs & imaging results that were available during my care of the patient were reviewed by me and considered in my medical decision  making (see chart for details).    Patient is nontoxic, vital signs are normal, presents with sudden onset of severe pain radiating from the neck to the chest.  The positional nature and worse with movement and the tenderness on exam are highly suggestive of muscle strain/spasm, but with her sudden onset, severe radiating pain, CT scan needed to rule out dissection.  The CT scan was unremarkable.  There were incidental findings including cholelithiasis, small nodule in the tail of the pancreas, hepatic steatosis, CAD.  These findings were discussed with the patient and family at bedside and follow-up recommendations relayed.  Plan Lidoderm patch, NSAIDs, small supply of Valium tablets for use at night when sleeping, heat therapy.  Follow-up with primary care.  Counseled patient on sedative effect of the Valium and avoiding driving or other potentially dangerous tasks while taking the medicine.      ____________________________________________   FINAL CLINICAL IMPRESSION(S) / ED DIAGNOSES    Final diagnoses:  Muscle spasms of neck     ED Discharge Orders         Ordered    naproxen (NAPROSYN) 500 MG tablet  2 times daily with meals     08/17/18 1302    lidocaine (LIDODERM) 5 %  Every 12 hours     08/17/18 1302    diazepam (VALIUM) 5 MG tablet  2 times daily PRN     08/17/18 1302          Portions of this note were generated with dragon dictation software. Dictation errors may occur despite best attempts at proofreading.    Carrie Mew, MD 08/17/18 1312

## 2018-08-17 NOTE — ED Notes (Signed)
Pt now complains of sternal pain.

## 2018-08-18 ENCOUNTER — Telehealth: Payer: Self-pay | Admitting: Family Medicine

## 2018-08-18 NOTE — Telephone Encounter (Signed)
Called to resched AWV

## 2018-08-22 ENCOUNTER — Encounter: Payer: Self-pay | Admitting: Family Medicine

## 2018-08-22 ENCOUNTER — Ambulatory Visit (INDEPENDENT_AMBULATORY_CARE_PROVIDER_SITE_OTHER): Payer: Medicare HMO | Admitting: Family Medicine

## 2018-08-22 VITALS — BP 120/60 | HR 64 | Ht 63.0 in | Wt 190.0 lb

## 2018-08-22 DIAGNOSIS — J9811 Atelectasis: Secondary | ICD-10-CM | POA: Diagnosis not present

## 2018-08-22 DIAGNOSIS — I2510 Atherosclerotic heart disease of native coronary artery without angina pectoris: Secondary | ICD-10-CM | POA: Diagnosis not present

## 2018-08-22 DIAGNOSIS — K8689 Other specified diseases of pancreas: Secondary | ICD-10-CM

## 2018-08-22 DIAGNOSIS — S161XXD Strain of muscle, fascia and tendon at neck level, subsequent encounter: Secondary | ICD-10-CM

## 2018-08-22 DIAGNOSIS — R933 Abnormal findings on diagnostic imaging of other parts of digestive tract: Secondary | ICD-10-CM | POA: Diagnosis not present
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
## 2018-08-29 ENCOUNTER — Ambulatory Visit: Payer: Medicare HMO

## 2018-08-30 ENCOUNTER — Ambulatory Visit
Admission: RE | Admit: 2018-08-30 | Discharge: 2018-08-30 | Disposition: A | Discharge status: Home / Self Care | Payer: Medicare HMO | Source: Ambulatory Visit | Attending: Family Medicine | Admitting: Family Medicine

## 2018-08-30 DIAGNOSIS — R933 Abnormal findings on diagnostic imaging of other parts of digestive tract: Secondary | ICD-10-CM | POA: Diagnosis not present

## 2018-08-30 DIAGNOSIS — K8020 Calculus of gallbladder without cholecystitis without obstruction: Secondary | ICD-10-CM | POA: Diagnosis not present

## 2018-08-30 MED ORDER — GADOBUTROL 1 MMOL/ML IV SOLN
7.5000 mL | Freq: Once | INTRAVENOUS | Status: AC | PRN
  Administered 2018-08-30: 7.5 mL via INTRAVENOUS

## 2018-09-17 ENCOUNTER — Other Ambulatory Visit: Payer: Self-pay | Admitting: Family Medicine

## 2018-09-17 DIAGNOSIS — J309 Allergic rhinitis, unspecified: Secondary | ICD-10-CM

## 2018-09-18 ENCOUNTER — Other Ambulatory Visit: Payer: Self-pay | Admitting: Family Medicine

## 2018-09-18 DIAGNOSIS — F419 Anxiety disorder, unspecified: Secondary | ICD-10-CM

## 2018-09-18 DIAGNOSIS — F418 Other specified anxiety disorders: Secondary | ICD-10-CM

## 2018-09-22 ENCOUNTER — Other Ambulatory Visit: Payer: Self-pay

## 2018-09-22 ENCOUNTER — Other Ambulatory Visit: Payer: Self-pay | Admitting: Family Medicine

## 2018-09-22 DIAGNOSIS — F418 Other specified anxiety disorders: Secondary | ICD-10-CM

## 2018-09-22 DIAGNOSIS — F419 Anxiety disorder, unspecified: Secondary | ICD-10-CM

## 2018-09-22 MED ORDER — FLUOXETINE HCL 20 MG PO CAPS: 20.0000 mg | ORAL_CAPSULE | Freq: Every morning | ORAL | 0 refills | Status: DC

## 2018-09-28 ENCOUNTER — Ambulatory Visit
Admission: RE | Admit: 2018-09-28 | Discharge: 2018-09-28 | Disposition: A | Discharge status: Home / Self Care | Payer: Medicare HMO | Source: Ambulatory Visit | Attending: Family Medicine | Admitting: Family Medicine

## 2018-09-28 ENCOUNTER — Other Ambulatory Visit: Payer: Self-pay | Admitting: Family Medicine

## 2018-09-28 ENCOUNTER — Ambulatory Visit (INDEPENDENT_AMBULATORY_CARE_PROVIDER_SITE_OTHER): Payer: Medicare HMO

## 2018-09-28 VITALS — BP 148/68 | HR 72 | Temp 97.9°F | Resp 18 | Ht 63.0 in | Wt 191.2 lb

## 2018-09-28 DIAGNOSIS — J9811 Atelectasis: Secondary | ICD-10-CM | POA: Insufficient documentation

## 2018-09-28 DIAGNOSIS — R933 Abnormal findings on diagnostic imaging of other parts of digestive tract: Secondary | ICD-10-CM | POA: Diagnosis not present

## 2018-09-28 DIAGNOSIS — Z0000 Encounter for general adult medical examination without abnormal findings: Secondary | ICD-10-CM | POA: Diagnosis not present

## 2018-09-28 DIAGNOSIS — R0602 Shortness of breath: Secondary | ICD-10-CM | POA: Diagnosis not present

## 2018-09-28 NOTE — Progress Notes (Signed)
Subjective:   Regina Macdonald is a 77 y.o. female who presents for Medicare Annual (Subsequent) preventive examination.  Review of Systems:   Cardiac Risk Factors include: advanced age (>43men, >62 women);obesity (BMI >30kg/m2);dyslipidemia     Objective:     Vitals: BP (!) 148/68 (BP Location: Left Arm, Patient Position: Sitting, Cuff Size: Normal)   Pulse 72   Temp 97.9 F (36.6 C) (Oral)   Resp 18   Ht 5\' 3"  (1.6 m)   Wt 191 lb 3.2 oz (86.7 kg)   BMI 33.87 kg/m   Body mass index is 33.87 kg/m.  Advanced Directives 09/28/2018 08/16/2017 07/20/2017 11/20/2015 04/16/2015  Does Patient Have a Medical Advance Directive? No Yes Yes Yes No  Type of Advance Directive - Clinical cytogeneticist of [employer] Power of [place];Living will -  Copy of [place] in Chart? - No - copy requested Yes No - copy requested -  Would patient like information on creating a medical advance directive? Yes (MAU/Ambulatory/Procedural Areas - Information given) - - - No - patient declined information    Tobacco Social History   Tobacco Use  Smoking Status Never Smoker  Smokeless Tobacco Never Used     Counseling given: Not Answered   Clinical Intake:  Pre-visit preparation completed: Yes  Pain : No/denies pain    Nutritional Status: BMI > 30  Obese Nutritional Risks: None Diabetes: No  How often do you need to have someone help you when you read instructions, pamphlets, or other written materials from your doctor or pharmacy?: 1 - Never What is the last grade level you completed in school?: 12 th grade  Interpreter Needed?: No  Information entered by :: Clemetine Marker LPN  Past Medical History:  Diagnosis Date  . Anxiety   . Asthma   . Cancer ([place]) cervical-1973  . COPD (chronic obstructive pulmonary disease) ([place])   . Depression   . Diverticula, colon   . Diverticulosis   . GERD (gastroesophageal reflux disease)   . Sleep apnea     Past Surgical History:  Procedure Laterality Date  . ABDOMINAL HYSTERECTOMY    . COLONOSCOPY  06/08/2013   Dr Candace Cruise- small mouth diverticula  . HIP SURGERY Left   . KNEE SURGERY Bilateral   . NASAL RECONSTRUCTION     x 2   Family History  Problem Relation Age of Onset  . Cirrhosis Father   . Coronary artery disease Mother   . Breast cancer Neg Hx    Social History   Socioeconomic History  . Marital status: Divorced    Spouse name: Not on file  . Number of children: Not on file  . Years of education: Not on file  . Highest education level: High school graduate  Occupational History  . Occupation: retired  Scientific laboratory technician  . Financial resource strain: Not hard at all  . Food insecurity:    Worry: Never true    Inability: Never true  . Transportation needs:    Medical: No    Non-medical: No  Tobacco Use  . Smoking status: Never Smoker  . Smokeless tobacco: Never Used  Substance and Sexual Activity  . Alcohol use: No    Alcohol/week: 0.0 standard drinks  . Drug use: No  . Sexual activity: Never    Birth control/protection: Post-menopausal  Lifestyle  . Physical activity:    Days per week: 0 days    Minutes per session: 0 min  .  Stress: Patient refused  Relationships  . Social connections:    Talks on phone: More than three times a week    Gets together: More than three times a week    Attends religious service: Never    Active member of club or organization: No    Attends meetings of clubs or organizations: Never    Relationship status: Divorced  Other Topics Concern  . Not on file  Social History Narrative  . Not on file    Outpatient Encounter Medications as of 09/28/2018  Medication Sig  . acetaminophen (TYLENOL) 650 MG CR tablet Take 1 tablet by mouth every 8 (eight) hours.  Marland Kitchen aspirin 81 MG chewable tablet Chew by mouth.  Marland Kitchen CALCIUM & MAGNESIUM CARBONATES PO Take 1 tablet by mouth daily.  . Cyanocobalamin (VITAMIN B-12) 1000 MCG SUBL 1 tablet daily.  Marland Kitchen  EQUATE STOOL SOFTENER 100 MG capsule TAKE 1 CAPSULE BY MOUTH TWICE DAILY  . fexofenadine (ALLEGRA) 180 MG tablet Take 180 mg by mouth daily.  Marland Kitchen FLUoxetine (PROZAC) 10 MG tablet Take 1 tablet (10 mg total) by mouth 2 (two) times daily. Pt taking 20mg  + 10mg =30mg  in the am and 10mg  in the evening  . FLUoxetine (PROZAC) 20 MG capsule Take 1 capsule (20 mg total) by mouth every morning.  . fluticasone (FLONASE) 50 MCG/ACT nasal spray USE 2 SPRAY(S) IN EACH NOSTRIL ONCE DAILY  . fluticasone furoate-vilanterol (BREO ELLIPTA) 100-25 MCG/INH AEPB Inhale 1 puff into the lungs daily.  Marland Kitchen gemfibrozil (LOPID) 600 MG tablet TAKE 1 TABLET BY MOUTH TWICE DAILY  . Multiple Vitamins-Minerals (CENTRUM SILVER PO) Take 1 capsule by mouth daily.  . naproxen (NAPROSYN) 500 MG tablet Take 1 tablet (500 mg total) by mouth 2 (two) times daily with a meal.  . pantoprazole (PROTONIX) 40 MG tablet TAKE 1 TABLET BY MOUTH ONCE DAILY  . polyethylene glycol (MIRALAX) packet Take 17 g by mouth daily.  . cholecalciferol (VITAMIN D) 400 units TABS tablet Take 400 Units by mouth daily.  . diazepam (VALIUM) 5 MG tablet Take 1 tablet (5 mg total) by mouth 2 (two) times daily as needed for muscle spasms. (Patient not taking: Reported on 09/28/2018)  . FLUoxetine (PROZAC) 10 MG tablet  TAKE 1 TABLET BY MOUTH TWICE DAILY TAKE  WITH  A  20MG  = 30MG   IN  THE  MORNING  AND  TAKE  10  MG  BY  ITSELF  AT  NIGHT  . FLUoxetine (PROZAC) 20 MG capsule TAKE 1 CAPSULE BY MOUTH TWICE DAILY . APPOINTMENT REQUIRED FOR FUTURE REFILLS  . lidocaine (LIDODERM) 5 % Place 1 patch onto the skin every 12 (twelve) hours. Remove & Discard patch within 12 hours or as directed by MD (Patient not taking: Reported on 09/28/2018)  . montelukast (SINGULAIR) 10 MG tablet Take 10 mg by mouth daily. Dr Chancy Milroy  . mupirocin ointment (BACTROBAN) 2 % Apply 1 application topically 2 (two) times daily.  . traMADol (ULTRAM) 50 MG tablet Take 1 tablet (50 mg total) by mouth every 8  (eight) hours as needed. (Patient not taking: Reported on 08/16/2018)   No facility-administered encounter medications on file as of 09/28/2018.     Activities of Daily Living In your present state of health, do you have any difficulty performing the following activities: 09/28/2018  Hearing? N  Comment declines hearing aids  Vision? N  Comment wears glasses  Difficulty concentrating or making decisions? N  Walking or climbing stairs? N  Dressing or  bathing? N  Doing errands, shopping? N  Preparing Food and eating ? N  Using the Toilet? N  In the past six months, have you accidently leaked urine? N  Comment wears pads for protection occasionally  Do you have problems with loss of bowel control? N  Managing your Medications? N  Managing your Finances? N  Housekeeping or managing your Housekeeping? N  Some recent data might be hidden    Patient Care Team: Juline Patch, MD as PCP - General (Family Medicine) [name], MD as Consulting Physician (Internal Medicine)    Assessment:   This is a routine wellness examination for [place].  Exercise Activities and Dietary recommendations Current Exercise Habits: The patient does not participate in regular exercise at present, Exercise limited by: respiratory conditions(s)  Goals    . DIET - INCREASE WATER INTAKE     Recommend drinking 6-8 glasses of water per day    . Prevent Falls     Fall prevention discussed       Fall Risk Fall Risk  09/28/2018 08/16/2018 12/30/2017 08/19/2017 08/16/2017  Falls in the past year? 0 No No Yes Yes  Number falls in past yr: 0 - - 1 2 or more  Injury with Fall? - - - No No  Follow up - - - - Falls prevention discussed   FALL RISK PREVENTION PERTAINING TO THE HOME:  Any stairs in or around the home WITH handrails? No  Home free of loose throw rugs in walkways, pet beds, electrical cords, etc? No   - fall prevention discussed Adequate lighting in your home to reduce risk of falls? Yes     ASSISTIVE DEVICES UTILIZED TO PREVENT FALLS:  Life alert? No  Use of a cane, walker or w/c? No  Grab bars in the bathroom? No  Shower chair or bench in shower? No  Elevated toilet seat or a handicapped toilet? No   DME ORDERS:  DME order needed?  No   TIMED UP AND GO:  Was the test performed? Yes .  Length of time to ambulate 10 feet: 6 sec.   GAIT:  Appearance of gait: Gait stead-fast and without the use of an assistive device.   Education: Fall risk prevention has been discussed.  Intervention(s) required? No   Depression Screen PHQ 2/9 Scores 09/28/2018 08/16/2018 12/30/2017 08/19/2017  PHQ - 2 Score 0 0 0 0  PHQ- 9 Score - - - 0     Cognitive Function     6CIT Screen 09/28/2018 08/16/2017  What Year? 0 points 0 points  What month? 0 points 0 points  What time? 0 points 0 points  Count back from 20 0 points 0 points  Months in reverse 0 points 0 points  Repeat phrase 2 points 2 points  Total Score 2 2    Immunization History  Administered Date(s) Administered  . Influenza, High Dose Seasonal PF 08/16/2017, 08/07/2018  . Influenza,inj,Quad PF,6+ Mos 09/09/2015  . Influenza-Unspecified 07/24/2014, 08/07/2018  . Pneumococcal Conjugate-13 09/09/2015  . Pneumococcal Polysaccharide-23 02/09/2013  . Tdap 08/10/2011  . Zoster 02/27/2015    Qualifies for Shingles Vaccine? Yes  Zostavax completed 02/27/15. Due for Shingrix. Education has been provided regarding the importance of this vaccine. Pt has been advised to call insurance company to determine out of pocket expense. Advised may also receive vaccine at local pharmacy or Health Dept. Verbalized acceptance and understanding.  Tdap: Up to date  Flu Vaccine: Up to date   Pneumococcal  Vaccine: Up to date   Screening Tests Health Maintenance  Topic Date Due  . TETANUS/TDAP  08/09/2021  . INFLUENZA VACCINE  Completed  . DEXA SCAN  Completed  . PNA vac Low Risk Adult  Completed    Cancer  Screenings:  Colorectal Screening:  No longer required.   Mammogram: No longer required.   Bone Density: Completed 03/19/15. Results reflect OSTEOPENIA, pt taking calcium and vitamin D supplement  Lung Cancer Screening: (Low Dose CT Chest recommended if Age 68-80 years, 30 pack-year currently smoking OR have quit w/in 15years.) does not qualify.    Additional Screening:  Hepatitis C Screening: no longer required  Vision Screening: Recommended annual ophthalmology exams for early detection of glaucoma and other disorders of the eye. Is the patient up to date with their annual eye exam?  Yes  Who is the provider or what is the name of the office in which the pt attends annual eye exams? [place]  Dental Screening: Recommended annual dental exams for proper oral hygiene  Community Resource Referral:  CRR required this visit?  No      Plan:    I have personally reviewed and addressed the Medicare Annual Wellness questionnaire and have noted the following in the patient's chart:  A. Medical and social history B. Use of alcohol, tobacco or illicit drugs  C. Current medications and supplements D. Functional ability and status E.  Nutritional status F.  Physical activity G. Advance directives H. List of other physicians I.  Hospitalizations, surgeries, and ER visits in previous 12 months J.  [place] such as hearing and vision if needed, cognitive and depression L. Referrals and appointments   In addition, I have reviewed and discussed with patient certain preventive protocols, quality metrics, and best practice recommendations. A written personalized care plan for preventive services as well as general preventive health recommendations were provided to patient.   Signed,  Clemetine Marker, LPN Nurse Health Advisor   Nurse Notes: elevatd BP today, pt states harder to breath when it's cold so she was wearing a mask; pt also scheduled for repeat chest xray  today and f/u appt tomorrow with Dr. Ronnald Ramp to review results. BP to be rechecked tomorrow. Pt denied headache, blurred vision or pain.

## 2018-09-28 NOTE — Patient Instructions (Signed)
Regina Macdonald , Thank you for taking time to come for your Medicare Wellness Visit. I appreciate your ongoing commitment to your health goals. Please review the following plan we discussed and let me know if I can assist you in the future.   Screening recommendations/referrals: Bone Density: done 03/19/15 Recommended yearly ophthalmology/optometry visit for glaucoma screening and checkup Recommended yearly dental visit for hygiene and checkup  Vaccinations: Influenza vaccine: completed 08/07/18 Pneumococcal vaccine: completed 09/09/15 Tdap vaccine: completed 08/10/11 Shingles vaccine: Shingrix discussed.    Advanced directives: Advance directive discussed with you today. I have provided a copy for you to complete at home and have notarized. Once this is complete please bring a copy in to our office so we can scan it into your chart.  Conditions/risks identified: Recommend increasing water intake  Next appointment: Please follow up in one year for your Medicare Annual Wellness visit.     Preventive Care 79 Years and Older, Female Preventive care refers to lifestyle choices and visits with your health care provider that can promote health and wellness. What does preventive care include?  A yearly physical exam. This is also called an annual well check.  Dental exams once or twice a year.  Routine eye exams. Ask your health care provider how often you should have your eyes checked.  Personal lifestyle choices, including:  Daily care of your teeth and gums.  Regular physical activity.  Eating a healthy diet.  Avoiding tobacco and drug use.  Limiting alcohol use.  Practicing safe sex.  Taking low-dose aspirin every day.  Taking vitamin and mineral supplements as recommended by your health care provider. What happens during an annual well check? The services and screenings done by your health care provider during your annual well check will depend on your age, overall health,  lifestyle risk factors, and family history of disease. Counseling  Your health care provider may ask you questions about your:  Alcohol use.  Tobacco use.  Drug use.  Emotional well-being.  Home and relationship well-being.  Sexual activity.  Eating habits.  History of falls.  Memory and ability to understand (cognition).  Work and work Statistician.  Reproductive health. Screening  You may have the following tests or measurements:  Height, weight, and BMI.  Blood pressure.  Lipid and cholesterol levels. These may be checked every 5 years, or more frequently if you are over 59 years old.  Skin check.  Lung cancer screening. You may have this screening every year starting at age 90 if you have a 30-pack-year history of smoking and currently smoke or have quit within the past 15 years.  Fecal occult blood test (FOBT) of the stool. You may have this test every year starting at age 40.  Flexible sigmoidoscopy or colonoscopy. You may have a sigmoidoscopy every 5 years or a colonoscopy every 10 years starting at age 40.  Hepatitis C blood test.  Hepatitis B blood test.  Sexually transmitted disease (STD) testing.  Diabetes screening. This is done by checking your blood sugar (glucose) after you have not eaten for a while (fasting). You may have this done every 1-3 years.  Bone density scan. This is done to screen for osteoporosis. You may have this done starting at age 27.  Mammogram. This may be done every 1-2 years. Talk to your health care provider about how often you should have regular mammograms. Talk with your health care provider about your test results, treatment options, and if necessary, the need for more  tests. Vaccines  Your health care provider may recommend certain vaccines, such as:  Influenza vaccine. This is recommended every year.  Tetanus, diphtheria, and acellular pertussis (Tdap, Td) vaccine. You may need a Td booster every 10 years.  Zoster  vaccine. You may need this after age 82.  Pneumococcal 13-valent conjugate (PCV13) vaccine. One dose is recommended after age 26.  Pneumococcal polysaccharide (PPSV23) vaccine. One dose is recommended after age 35. Talk to your health care provider about which screenings and vaccines you need and how often you need them. This information is not intended to replace advice given to you by your health care provider. Make sure you discuss any questions you have with your health care provider. Document Released: 11/29/2015 Document Revised: 07/22/2016 Document Reviewed: 09/03/2015 Elsevier Interactive Patient Education  2017 [place] Prevention in the Home Falls can cause injuries. They can happen to people of all ages. There are many things you can do to make your home safe and to help prevent falls. What can I do on the outside of my home?  Regularly fix the edges of walkways and driveways and fix any cracks.  Remove anything that might make you trip as you walk through a door, such as a raised step or threshold.  Trim any bushes or trees on the path to your home.  Use bright outdoor lighting.  Clear any walking paths of anything that might make someone trip, such as rocks or tools.  Regularly check to see if handrails are loose or broken. Make sure that both sides of any steps have handrails.  Any raised decks and porches should have guardrails on the edges.  Have any leaves, snow, or ice cleared regularly.  Use sand or salt on walking paths during winter.  Clean up any spills in your garage right away. This includes oil or grease spills. What can I do in the bathroom?  Use night lights.  Install grab bars by the toilet and in the tub and shower. Do not use towel bars as grab bars.  Use non-skid mats or decals in the tub or shower.  If you need to sit down in the shower, use a plastic, non-slip stool.  Keep the floor dry. Clean up any water that spills on the  floor as soon as it happens.  Remove soap buildup in the tub or shower regularly.  Attach bath mats securely with double-sided non-slip rug tape.  Do not have throw rugs and other things on the floor that can make you trip. What can I do in the bedroom?  Use night lights.  Make sure that you have a light by your bed that is easy to reach.  Do not use any sheets or blankets that are too big for your bed. They should not hang down onto the floor.  Have a firm chair that has side arms. You can use this for support while you get dressed.  Do not have throw rugs and other things on the floor that can make you trip. What can I do in the kitchen?  Clean up any spills right away.  Avoid walking on wet floors.  Keep items that you use a lot in easy-to-reach places.  If you need to reach something above you, use a strong step stool that has a grab bar.  Keep electrical cords out of the way.  Do not use floor polish or wax that makes floors slippery. If you must use wax, use non-skid floor  wax.  Do not have throw rugs and other things on the floor that can make you trip. What can I do with my stairs?  Do not leave any items on the stairs.  Make sure that there are handrails on both sides of the stairs and use them. Fix handrails that are broken or loose. Make sure that handrails are as long as the stairways.  Check any carpeting to make sure that it is firmly attached to the stairs. Fix any carpet that is loose or worn.  Avoid having throw rugs at the top or bottom of the stairs. If you do have throw rugs, attach them to the floor with carpet tape.  Make sure that you have a light switch at the top of the stairs and the bottom of the stairs. If you do not have them, ask someone to add them for you. What else can I do to help prevent falls?  Wear shoes that:  Do not have high heels.  Have rubber bottoms.  Are comfortable and fit you well.  Are closed at the toe. Do not wear  sandals.  If you use a stepladder:  Make sure that it is fully opened. Do not climb a closed stepladder.  Make sure that both sides of the stepladder are locked into place.  Ask someone to hold it for you, if possible.  Clearly mark and make sure that you can see:  Any grab bars or handrails.  First and last steps.  Where the edge of each step is.  Use tools that help you move around (mobility aids) if they are needed. These include:  Canes.  Walkers.  Scooters.  Crutches.  Turn on the lights when you go into a dark area. Replace any light bulbs as soon as they burn out.  Set up your furniture so you have a clear path. Avoid moving your furniture around.  If any of your floors are uneven, fix them.  If there are any pets around you, be aware of where they are.  Review your medicines with your doctor. Some medicines can make you feel dizzy. This can increase your chance of falling. Ask your doctor what other things that you can do to help prevent falls. This information is not intended to replace advice given to you by your health care provider. Make sure you discuss any questions you have with your health care provider. Document Released: 08/29/2009 Document Revised: 04/09/2016 Document Reviewed: 12/07/2014 Elsevier Interactive Patient Education  2017 [employer].

## 2018-09-29 ENCOUNTER — Ambulatory Visit (INDEPENDENT_AMBULATORY_CARE_PROVIDER_SITE_OTHER): Payer: Medicare HMO | Admitting: Family Medicine

## 2018-09-29 ENCOUNTER — Encounter: Payer: Self-pay | Admitting: Family Medicine

## 2018-09-29 VITALS — BP 122/68 | HR 68 | Resp 16 | Ht 63.0 in | Wt 191.0 lb

## 2018-09-29 DIAGNOSIS — I2510 Atherosclerotic heart disease of native coronary artery without angina pectoris: Secondary | ICD-10-CM

## 2018-09-29 DIAGNOSIS — Z9289 Personal history of other medical treatment: Secondary | ICD-10-CM

## 2018-09-29 DIAGNOSIS — J9811 Atelectasis: Secondary | ICD-10-CM

## 2018-09-29 DIAGNOSIS — K760 Fatty (change of) liver, not elsewhere classified: Secondary | ICD-10-CM | POA: Diagnosis not present

## 2018-09-29 NOTE — Progress Notes (Signed)
Date:  09/29/2018   Name:  Regina Macdonald   DOB:  Apr 04, 1941   MRN:  [id number]   Chief Complaint: Consult (discuss xray results ) Patient presents to review xray of chest for atelectasis and MRI of nodule in pancreatic tail.    Review of Systems  Constitutional: Negative for chills and fever.  HENT: Negative for drooling, ear discharge, ear pain and sore throat.   Respiratory: Negative for cough, shortness of breath and wheezing.   Cardiovascular: Negative for chest pain, palpitations and leg swelling.  Gastrointestinal: Negative for abdominal distention, abdominal pain, anal bleeding, blood in stool, constipation, diarrhea, nausea, rectal pain and vomiting.  Endocrine: Negative for polydipsia.  Genitourinary: Negative for dysuria, frequency, hematuria and urgency.  Musculoskeletal: Negative for back pain, myalgias and neck pain.  Skin: Negative for rash.  Allergic/Immunologic: Negative for environmental allergies.  Neurological: Negative for dizziness and headaches.  Hematological: Does not bruise/bleed easily.  Psychiatric/Behavioral: Negative for suicidal ideas. The patient is not nervous/anxious.     Patient Active Problem List   Diagnosis Date Noted  . Mild episode of recurrent major depressive disorder ([place]) 03/23/2018  . Anxiety 03/23/2018  . Taking medication for chronic disease 03/23/2018  . Other constipation 12/13/2017  . Depression with anxiety 08/19/2017  . Hyperlipidemia 08/19/2017  . Gastroesophageal reflux disease without esophagitis 08/19/2017  . Other chest pain 04/16/2015  . COPD exacerbation ([place]) 04/16/2015  . Sleep apnea 04/16/2015  . 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 Asthmatic bronchitis 04/16/2015  . Abdominal pain, LLQ 03/29/2014  . Rectal bleeding 03/29/2014  . 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 Osteoarthrosis, unspecified whether generalized or localized, pelvic region and thigh 03/26/2014    Allergies  Allergen Reactions  .  Ciprofloxacin Other (See Comments)  . Etodolac Other (See Comments)  . Sulfa Antibiotics Rash    Past Surgical History:  Procedure Laterality Date  . ABDOMINAL HYSTERECTOMY    . COLONOSCOPY  06/08/2013   Dr Candace Cruise- small mouth diverticula  . HIP SURGERY Left   . KNEE SURGERY Bilateral   . NASAL RECONSTRUCTION     x 2    Social History   Tobacco Use  . Smoking status: Never Smoker  . Smokeless tobacco: Never Used  Substance Use Topics  . Alcohol use: No    Alcohol/week: 0.0 standard drinks  . Drug use: No     Medication list has been reviewed and updated.  Current Meds  Medication Sig  . acetaminophen (TYLENOL) 650 MG CR tablet Take 1 tablet by mouth every 8 (eight) hours.  Marland Kitchen aspirin 81 MG chewable tablet Chew by mouth.  Marland Kitchen CALCIUM & MAGNESIUM CARBONATES PO Take 1 tablet by mouth daily.  . cholecalciferol (VITAMIN D) 400 units TABS tablet Take 400 Units by mouth daily.  . Cyanocobalamin (VITAMIN B-12) 1000 MCG SUBL 1 tablet daily.  . diazepam (VALIUM) 5 MG tablet Take 1 tablet (5 mg total) by mouth 2 (two) times daily as needed for muscle spasms.  Marland Kitchen EQUATE STOOL SOFTENER 100 MG capsule TAKE 1 CAPSULE BY MOUTH TWICE DAILY  . fexofenadine (ALLEGRA) 180 MG tablet Take 180 mg by mouth daily.  Marland Kitchen FLUoxetine (PROZAC) 10 MG tablet Take 1 tablet (10 mg total) by mouth 2 (two) times daily. Pt taking 20mg  + 10mg =30mg  in the am and 10mg  in the evening  . FLUoxetine (PROZAC) 10 MG tablet  TAKE 1 TABLET BY MOUTH TWICE DAILY TAKE  WITH  A  20MG  = 30MG   IN  THE  MORNING  AND  TAKE  10  MG  BY  ITSELF  AT  NIGHT  . FLUoxetine (PROZAC) 20 MG capsule TAKE 1 CAPSULE BY MOUTH TWICE DAILY . APPOINTMENT REQUIRED FOR FUTURE REFILLS  . FLUoxetine (PROZAC) 20 MG capsule Take 1 capsule (20 mg total) by mouth every morning.  . fluticasone furoate-vilanterol (BREO ELLIPTA) 100-25 MCG/INH AEPB Inhale 1 puff into the lungs daily.  Marland Kitchen gemfibrozil (LOPID) 600 MG tablet TAKE 1 TABLET BY MOUTH TWICE DAILY  .  lidocaine (LIDODERM) 5 % Place 1 patch onto the skin every 12 (twelve) hours. Remove & Discard patch within 12 hours or as directed by MD  . montelukast (SINGULAIR) 10 MG tablet Take 10 mg by mouth daily. Dr Chancy Milroy  . Multiple Vitamins-Minerals (CENTRUM SILVER PO) Take 1 capsule by mouth daily.  . mupirocin ointment (BACTROBAN) 2 % Apply 1 application topically 2 (two) times daily.  . naproxen (NAPROSYN) 500 MG tablet Take 1 tablet (500 mg total) by mouth 2 (two) times daily with a meal.  . pantoprazole (PROTONIX) 40 MG tablet TAKE 1 TABLET BY MOUTH ONCE DAILY  . polyethylene glycol (MIRALAX) packet Take 17 g by mouth daily.  . traMADol (ULTRAM) 50 MG tablet Take 1 tablet (50 mg total) by mouth every 8 (eight) hours as needed.  . [DISCONTINUED] fluticasone (FLONASE) 50 MCG/ACT nasal spray USE 2 SPRAY(S) IN EACH NOSTRIL ONCE DAILY    PHQ 2/9 Scores 09/29/2018 09/28/2018 08/16/2018 12/30/2017  PHQ - 2 Score 2 0 0 0  PHQ- 9 Score 4 - - -    Physical Exam  Constitutional: No distress.  HENT:  Head: Normocephalic and atraumatic.  Right Ear: External ear normal.  Left Ear: External ear normal.  Nose: Nose normal.  Mouth/Throat: Oropharynx is clear and moist.  Eyes: Pupils are equal, round, and reactive to light. Conjunctivae and EOM are normal. Right eye exhibits no discharge. Left eye exhibits no discharge.  Neck: Normal range of motion. Neck supple. No JVD present. No thyromegaly present.  Cardiovascular: Normal rate, regular rhythm, normal heart sounds and intact distal pulses. Exam reveals no gallop and no friction rub.  No murmur heard. Pulmonary/Chest: Effort normal and breath sounds normal.  Abdominal: Soft. Bowel sounds are normal. She exhibits no mass. There is no tenderness. There is no guarding.  Musculoskeletal: Normal range of motion. She exhibits no edema.  Lymphadenopathy:    She has no cervical adenopathy.  Neurological: She is alert. She has normal reflexes.  Skin: Skin is  warm and dry. She is not diaphoretic.  Nursing note and vitals reviewed.   BP 122/68   Pulse 68   Resp 16   Ht 5\' 3"  (1.6 m)   Wt 191 lb (86.6 kg)   SpO2 98%   BMI 33.83 kg/m   Assessment and Plan:  1. Atelectasis Discussed chest film . Stable with no cardiopulmonary changes  2. History of MRI Discuss pancreatic nodule and stable since 2015.  3. 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 Continue ASA 81 mg daily.   4. Hepatic steatosis Suggest weight loss. Will follow.   Dr. Macon [facility]  09/29/2018

## 2018-10-11 DIAGNOSIS — G4733 Obstructive sleep apnea (adult) (pediatric): Secondary | ICD-10-CM | POA: Diagnosis not present

## 2018-10-24 ENCOUNTER — Other Ambulatory Visit: Payer: Self-pay | Admitting: Family Medicine

## 2018-10-24 DIAGNOSIS — E785 Hyperlipidemia, unspecified: Secondary | ICD-10-CM

## 2018-11-03 ENCOUNTER — Other Ambulatory Visit: Payer: Self-pay | Admitting: Family Medicine

## 2018-11-03 DIAGNOSIS — J309 Allergic rhinitis, unspecified: Secondary | ICD-10-CM

## 2018-11-26 ENCOUNTER — Other Ambulatory Visit: Payer: Self-pay | Admitting: Family Medicine

## 2018-11-26 DIAGNOSIS — E785 Hyperlipidemia, unspecified: Secondary | ICD-10-CM

## 2018-12-08 ENCOUNTER — Other Ambulatory Visit: Payer: Self-pay | Admitting: Family Medicine

## 2018-12-08 DIAGNOSIS — J309 Allergic rhinitis, unspecified: Secondary | ICD-10-CM

## 2018-12-14 ENCOUNTER — Telehealth: Payer: Self-pay

## 2018-12-14 NOTE — Telephone Encounter (Signed)
Pt's insurance is no longer going to pay for Fluoxetine. Will pay for sertraline. We have to taper pt off of fluoxetine. Called her to give her the dosing of the taper. Take 20mg  in the am and 10mg  in the pm x 2 weeks, take 10mg  in the am and 10mg  in the pm x 2 weeks, and then 10mg  in the am x 2 weeks. This will end on March 14th. Pt will be seen on March 16th to discuss Sertraline

## 2019-01-09 ENCOUNTER — Other Ambulatory Visit: Payer: Self-pay | Admitting: Family Medicine

## 2019-01-09 DIAGNOSIS — K5909 Other constipation: Secondary | ICD-10-CM

## 2019-01-11 DIAGNOSIS — G4733 Obstructive sleep apnea (adult) (pediatric): Secondary | ICD-10-CM | POA: Diagnosis not present

## 2019-01-15 ENCOUNTER — Other Ambulatory Visit: Payer: Self-pay | Admitting: Adult Health

## 2019-01-15 DIAGNOSIS — J449 Chronic obstructive pulmonary disease, unspecified: Secondary | ICD-10-CM

## 2019-01-16 ENCOUNTER — Encounter: Payer: Self-pay | Admitting: Family Medicine

## 2019-01-16 ENCOUNTER — Other Ambulatory Visit: Payer: Self-pay

## 2019-01-16 ENCOUNTER — Ambulatory Visit (INDEPENDENT_AMBULATORY_CARE_PROVIDER_SITE_OTHER): Payer: Medicare Other | Admitting: Family Medicine

## 2019-01-16 VITALS — BP 128/80 | HR 79 | Ht 63.0 in | Wt 189.0 lb

## 2019-01-16 DIAGNOSIS — J301 Allergic rhinitis due to pollen: Secondary | ICD-10-CM

## 2019-01-16 DIAGNOSIS — F320 Major depressive disorder, single episode, mild: Secondary | ICD-10-CM | POA: Diagnosis not present

## 2019-01-16 DIAGNOSIS — J069 Acute upper respiratory infection, unspecified: Secondary | ICD-10-CM

## 2019-01-16 MED ORDER — FLUOXETINE HCL 20 MG PO TABS: 20.0000 mg | ORAL_TABLET | Freq: Every day | ORAL | 1 refills | Status: DC

## 2019-01-16 MED ORDER — FLUOXETINE HCL 10 MG PO CAPS: 10.0000 mg | ORAL_CAPSULE | Freq: Every day | ORAL | 1 refills | Status: DC

## 2019-01-16 MED ORDER — LORATADINE 10 MG PO TABS: 10.0000 mg | ORAL_TABLET | Freq: Every day | ORAL | 1 refills | Status: DC

## 2019-01-16 NOTE — Progress Notes (Signed)
Date:  01/16/2019   Name:  Regina Macdonald   DOB:  1941/03/30   MRN:  [id number]   Chief Complaint: Allergies (2 weeks. Sneezing, runny nose. No energy since prozac was decreased. using Zicam OTC. Not helping much.)  Sinusitis  This is a new problem. The current episode started 1 to 4 weeks ago (2 weeks). The problem is unchanged. There has been no fever. She is experiencing no pain. Associated symptoms include congestion and sinus pressure. Pertinent negatives include no chills, coughing, diaphoresis, ear pain, headaches, hoarse voice, neck pain (rhinorrhea), shortness of breath, sneezing, sore throat or swollen glands. Past treatments include oral decongestants. The treatment provided moderate relief.    Review of Systems  Constitutional: Negative.  Negative for chills, diaphoresis, fatigue, fever and unexpected weight change.  HENT: Positive for congestion and sinus pressure. Negative for ear discharge, ear pain, hoarse voice, rhinorrhea, sneezing and sore throat.   Eyes: Negative for photophobia, pain, discharge, redness and itching.  Respiratory: Negative for cough, shortness of breath, wheezing and stridor.   Gastrointestinal: Negative for abdominal pain, blood in stool, constipation, diarrhea, nausea and vomiting.  Endocrine: Negative for cold intolerance, heat intolerance, polydipsia, polyphagia and polyuria.  Genitourinary: Negative for dysuria, flank pain, frequency, hematuria, menstrual problem, pelvic pain, urgency, vaginal bleeding and vaginal discharge.  Musculoskeletal: Negative for arthralgias, back pain, myalgias and neck pain (rhinorrhea).  Skin: Negative for rash.  Allergic/Immunologic: Negative for environmental allergies and food allergies.  Neurological: Negative for dizziness, weakness, light-headedness, numbness and headaches.  Hematological: Negative for adenopathy. Does not bruise/bleed easily.  Psychiatric/Behavioral: Negative for dysphoric mood. The patient is  not nervous/anxious.     Patient Active Problem List   Diagnosis Date Noted  . Mild episode of recurrent major depressive disorder ([place]) 03/23/2018  . Anxiety 03/23/2018  . Taking medication for chronic disease 03/23/2018  . Other constipation 12/13/2017  . Depression with anxiety 08/19/2017  . Hyperlipidemia 08/19/2017  . Gastroesophageal reflux disease without esophagitis 08/19/2017  . Other chest pain 04/16/2015  . COPD exacerbation ([place]) 04/16/2015  . Sleep apnea 04/16/2015  . 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 Asthmatic bronchitis 04/16/2015  . Abdominal pain, LLQ 03/29/2014  . Rectal bleeding 03/29/2014  . 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 Osteoarthrosis, unspecified whether generalized or localized, pelvic region and thigh 03/26/2014    Allergies  Allergen Reactions  . Ciprofloxacin Other (See Comments)  . Etodolac Other (See Comments)  . Sulfa Antibiotics Rash    Past Surgical History:  Procedure Laterality Date  . ABDOMINAL HYSTERECTOMY    . COLONOSCOPY  06/08/2013   Dr Candace Cruise- small mouth diverticula  . HIP SURGERY Left   . KNEE SURGERY Bilateral   . NASAL RECONSTRUCTION     x 2    Social History   Tobacco Use  . Smoking status: Never Smoker  . Smokeless tobacco: Never Used  Substance Use Topics  . Alcohol use: No    Alcohol/week: 0.0 standard drinks  . Drug use: No     Medication list has been reviewed and updated.  Current Meds  Medication Sig  . acetaminophen (TYLENOL) 650 MG CR tablet Take 1 tablet by mouth every 8 (eight) hours.  Marland Kitchen aspirin 81 MG chewable tablet Chew by mouth.  Marland Kitchen BREO ELLIPTA 100-25 MCG/INH AEPB Inhale 1 puff by mouth once daily  . CALCIUM & MAGNESIUM CARBONATES PO Take 1 tablet by mouth daily.  . cholecalciferol (VITAMIN D) 400 units TABS tablet Take 400 Units by  mouth daily.  . Cyanocobalamin (VITAMIN B-12) 1000 MCG SUBL 1 tablet daily.  . diazepam (VALIUM) 5 MG tablet Take 1 tablet (5 mg total) by mouth 2 (two) times  daily as needed for muscle spasms.  Marland Kitchen EQUATE STOOL SOFTENER 100 MG capsule TAKE 1 CAPSULE BY MOUTH TWICE DAILY  . fexofenadine (ALLEGRA) 180 MG tablet Take 180 mg by mouth daily.  Marland Kitchen FLUoxetine (PROZAC) 10 MG tablet Take 1 tablet (10 mg total) by mouth 2 (two) times daily. Pt taking 20mg  + 10mg =30mg  in the am and 10mg  in the evening (Patient taking differently: Take 10 mg by mouth daily. Pt taking 20mg  + 10mg =30mg  in the am and 10mg  in the evening )  . FLUoxetine (PROZAC) 10 MG tablet  TAKE 1 TABLET BY MOUTH TWICE DAILY TAKE  WITH  A  20MG  = 30MG   IN  THE  MORNING  AND  TAKE  10  MG  BY  ITSELF  AT  NIGHT  . FLUoxetine (PROZAC) 20 MG capsule TAKE 1 CAPSULE BY MOUTH TWICE DAILY . APPOINTMENT REQUIRED FOR FUTURE REFILLS  . FLUoxetine (PROZAC) 20 MG capsule Take 1 capsule (20 mg total) by mouth every morning.  . fluticasone (FLONASE) 50 MCG/ACT nasal spray USE 2 SPRAY(S) IN EACH NOSTRIL ONCE DAILY  . gemfibrozil (LOPID) 600 MG tablet TAKE 1 TABLET BY MOUTH TWICE DAILY NEED APPOINTMENT FOR MEDS  . Homeopathic Products (ZICAM ALLERGY RELIEF NA) Place into the nose.  . lidocaine (LIDODERM) 5 % Place 1 patch onto the skin every 12 (twelve) hours. Remove & Discard patch within 12 hours or as directed by MD  . montelukast (SINGULAIR) 10 MG tablet Take 10 mg by mouth daily. Dr Chancy Milroy  . Multiple Vitamins-Minerals (CENTRUM SILVER PO) Take 1 capsule by mouth daily.  . mupirocin ointment (BACTROBAN) 2 % Apply 1 application topically 2 (two) times daily.  . naproxen (NAPROSYN) 500 MG tablet Take 1 tablet (500 mg total) by mouth 2 (two) times daily with a meal.  . pantoprazole (PROTONIX) 40 MG tablet TAKE 1 TABLET BY MOUTH ONCE DAILY  . polyethylene glycol (MIRALAX) packet Take 17 g by mouth daily.  . traMADol (ULTRAM) 50 MG tablet Take 1 tablet (50 mg total) by mouth every 8 (eight) hours as needed.    PHQ 2/9 Scores 01/16/2019 09/29/2018 09/28/2018 08/16/2018  PHQ - 2 Score 0 2 0 0  PHQ- 9 Score - 4 - -     Physical Exam Vitals signs and nursing note reviewed.  Constitutional:      General: She is not in acute distress.    Appearance: She is not diaphoretic.  HENT:     Head: Normocephalic and atraumatic.     Right Ear: External ear normal.     Left Ear: External ear normal.     Nose: Nose normal.  Eyes:     General:        Right eye: No discharge.        Left eye: No discharge.     Conjunctiva/sclera: Conjunctivae normal.     Pupils: Pupils are equal, round, and reactive to light.  Neck:     Musculoskeletal: Normal range of motion and neck supple.     Thyroid: No thyromegaly.     Vascular: No JVD.  Cardiovascular:     Rate and Rhythm: Normal rate and regular rhythm.     Heart sounds: Normal heart sounds. No murmur. No friction rub. No gallop.   Pulmonary:  Effort: Pulmonary effort is normal.     Breath sounds: Normal breath sounds.  Abdominal:     General: Bowel sounds are normal.     Palpations: Abdomen is soft. There is no mass.     Tenderness: There is no abdominal tenderness. There is no guarding.  Musculoskeletal: Normal range of motion.  Lymphadenopathy:     Cervical: No cervical adenopathy.  Skin:    General: Skin is warm and dry.  Neurological:     Mental Status: She is alert.     Deep Tendon Reflexes: Reflexes are normal and symmetric.     BP 128/80   Pulse 79   Ht 5\' 3"  (1.6 m)   Wt 189 lb (85.7 kg)   SpO2 98%   BMI 33.48 kg/m   Assessment and Plan: 1. Seasonal allergic rhinitis due to pollen Patient has a history of seasonal rhinitis and this is during the following tree.  We will replace her Allegra with loratadine 10 mg and continue Singulair and Flonase. - loratadine (CLARITIN) 10 MG tablet; Take 1 tablet (10 mg total) by mouth daily.  Dispense: 90 tablet; Refill: 1  2. Viral upper respiratory tract infection Patient may have an underlying upper respiratory infection likely viral for which the above regimen will be continued.  3. Current  mild episode of major depressive disorder, unspecified whether recurrent [facility]) Patient feels that her depression has worsened during the weaning down.  Of her Prozac.  We will return her to 30 mg which is a 20 in the morning 10 in the evening dosing. - FLUoxetine (PROZAC) 20 MG tablet; Take 1 tablet (20 mg total) by mouth daily.  Dispense: 90 tablet; Refill: 1 - FLUoxetine (PROZAC) 10 MG capsule; Take 1 capsule (10 mg total) by mouth daily.  Dispense: 90 capsule; Refill: 1

## 2019-01-23 ENCOUNTER — Other Ambulatory Visit: Payer: Self-pay | Admitting: Family Medicine

## 2019-01-23 DIAGNOSIS — E785 Hyperlipidemia, unspecified: Secondary | ICD-10-CM

## 2019-01-30 ENCOUNTER — Ambulatory Visit: Payer: Medicare HMO | Admitting: Family Medicine

## 2019-02-01 ENCOUNTER — Ambulatory Visit: Payer: Self-pay

## 2019-02-14 ENCOUNTER — Other Ambulatory Visit: Payer: Self-pay | Admitting: Adult Health

## 2019-02-14 ENCOUNTER — Other Ambulatory Visit: Payer: Self-pay | Admitting: Family Medicine

## 2019-02-14 DIAGNOSIS — J449 Chronic obstructive pulmonary disease, unspecified: Secondary | ICD-10-CM

## 2019-02-14 DIAGNOSIS — J309 Allergic rhinitis, unspecified: Secondary | ICD-10-CM

## 2019-02-23 ENCOUNTER — Ambulatory Visit: Payer: Self-pay | Admitting: Adult Health

## 2019-02-24 ENCOUNTER — Ambulatory Visit: Payer: Self-pay | Admitting: Adult Health

## 2019-03-01 ENCOUNTER — Ambulatory Visit: Payer: Self-pay

## 2019-03-09 ENCOUNTER — Ambulatory Visit: Payer: Self-pay | Admitting: Internal Medicine

## 2019-03-24 ENCOUNTER — Other Ambulatory Visit: Payer: Self-pay | Admitting: Family Medicine

## 2019-03-24 DIAGNOSIS — K5909 Other constipation: Secondary | ICD-10-CM

## 2019-03-27 ENCOUNTER — Other Ambulatory Visit: Payer: Self-pay | Admitting: Family Medicine

## 2019-03-27 DIAGNOSIS — J309 Allergic rhinitis, unspecified: Secondary | ICD-10-CM

## 2019-03-27 DIAGNOSIS — E785 Hyperlipidemia, unspecified: Secondary | ICD-10-CM

## 2019-04-05 ENCOUNTER — Other Ambulatory Visit: Payer: Self-pay

## 2019-04-05 ENCOUNTER — Ambulatory Visit (INDEPENDENT_AMBULATORY_CARE_PROVIDER_SITE_OTHER): Payer: Medicare Other

## 2019-04-05 DIAGNOSIS — G4733 Obstructive sleep apnea (adult) (pediatric): Secondary | ICD-10-CM | POA: Diagnosis not present

## 2019-04-05 NOTE — Progress Notes (Signed)
95 percentile pressure 9   95th percentile leak 53.8   apnea index 0.8  apnea-hypopnea index  2.0   total days used  >4 hr 177  total days used <4 hr 3 days  Total compliance 98 percent

## 2019-04-11 ENCOUNTER — Ambulatory Visit: Payer: Medicare Other | Admitting: Adult Health

## 2019-04-11 ENCOUNTER — Encounter: Payer: Self-pay | Admitting: Adult Health

## 2019-04-11 ENCOUNTER — Other Ambulatory Visit: Payer: Self-pay

## 2019-04-11 VITALS — BP 121/54 | HR 71 | Resp 16 | Ht 63.0 in | Wt 188.4 lb

## 2019-04-11 DIAGNOSIS — R0602 Shortness of breath: Secondary | ICD-10-CM

## 2019-04-11 DIAGNOSIS — J449 Chronic obstructive pulmonary disease, unspecified: Secondary | ICD-10-CM | POA: Diagnosis not present

## 2019-04-11 DIAGNOSIS — Z9989 Dependence on other enabling machines and devices: Secondary | ICD-10-CM | POA: Diagnosis not present

## 2019-04-11 DIAGNOSIS — G4733 Obstructive sleep apnea (adult) (pediatric): Secondary | ICD-10-CM

## 2019-04-11 MED ORDER — FLUTICASONE FUROATE-VILANTEROL 100-25 MCG/INH IN AEPB: INHALATION_SPRAY | RESPIRATORY_TRACT | 3 refills | Status: DC

## 2019-04-11 NOTE — Patient Instructions (Signed)

## 2019-04-11 NOTE — Progress Notes (Signed)
[facility] [place], [place] [address]  Pulmonary Sleep Medicine   Office Visit Note  Patient Name: Regina Macdonald DOB: 1941/08/05 MRN [id number]  Date of Service: 04/11/2019  Complaints/HPI: Pt here for follow up on cpap compliance.  She has a 98 percent compliance at this time.  She has 177 days of use greater than 4 hours and only 3 days that were less than 4 hours.  She reports good relief of symptoms.  Denies any hospitalizations or issues currently.  She also denies sinus issues, hemoptysis, chest pain or sob currently.   ROS  General: (-) fever, (-) chills, (-) night sweats, (-) weakness Skin: (-) rashes, (-) itching,. Eyes: (-) visual changes, (-) redness, (-) itching. Nose and Sinuses: (-) nasal stuffiness or itchiness, (-) postnasal drip, (-) nosebleeds, (-) sinus trouble. Mouth and Throat: (-) sore throat, (-) hoarseness. Neck: (-) swollen glands, (-) enlarged thyroid, (-) neck pain. Respiratory: - cough, (-) bloody sputum, - shortness of breath, - wheezing. Cardiovascular: - ankle swelling, (-) chest pain. Lymphatic: (-) lymph node enlargement. Neurologic: (-) numbness, (-) tingling. Psychiatric: (-) anxiety, (-) depression   Current Medication: Outpatient Encounter Medications as of 04/11/2019  Medication Sig Note  . acetaminophen (TYLENOL) 500 MG tablet Take 500 mg by mouth every 6 (six) hours as needed.   Marland Kitchen aspirin 81 MG chewable tablet Chew by mouth.   Marland Kitchen BREO ELLIPTA 100-25 MCG/INH AEPB Inhale 1 puff by mouth once daily   . CALCIUM & MAGNESIUM CARBONATES PO Take 1 tablet by mouth daily. 04/09/2015: Received from: [place]:   . Cyanocobalamin (VITAMIN B-12) 1000 MCG SUBL 1 tablet daily. 04/09/2015: Received from: [place]:   . EQUATE STOOL SOFTENER 100 MG capsule Take 1 capsule by mouth twice daily   . fexofenadine (ALLEGRA) 180 MG tablet Take 180 mg by mouth daily.   Marland Kitchen  FLUoxetine (PROZAC) 10 MG capsule Take 1 capsule (10 mg total) by mouth daily.   Marland Kitchen FLUoxetine (PROZAC) 20 MG capsule Take 1 capsule (20 mg total) by mouth every morning.   . fluticasone (FLONASE) 50 MCG/ACT nasal spray Use 2 spray(s) in each nostril once daily   . gemfibrozil (LOPID) 600 MG tablet Take 1 tablet by mouth twice daily   . pantoprazole (PROTONIX) 40 MG tablet TAKE 1 TABLET BY MOUTH ONCE DAILY   . loratadine (CLARITIN) 10 MG tablet Take 1 tablet (10 mg total) by mouth daily.   . montelukast (SINGULAIR) 10 MG tablet Take 10 mg by mouth daily. Dr Chancy Milroy   . Multiple Vitamins-Minerals (CENTRUM SILVER PO) Take 1 capsule by mouth daily.   . polyethylene glycol (MIRALAX) packet Take 17 g by mouth daily.   . [DISCONTINUED] acetaminophen (TYLENOL) 650 MG CR tablet Take 1 tablet by mouth every 8 (eight) hours. 04/09/2015: Received from: [place]:   . [DISCONTINUED] cholecalciferol (VITAMIN D) 400 units TABS tablet Take 400 Units by mouth daily.   . [DISCONTINUED] diazepam (VALIUM) 5 MG tablet Take 1 tablet (5 mg total) by mouth 2 (two) times daily as needed for muscle spasms. (Patient not taking: Reported on 04/11/2019)   . [DISCONTINUED] FLUoxetine (PROZAC) 20 MG tablet Take 1 tablet (20 mg total) by mouth daily.   . [DISCONTINUED] Homeopathic Products (ZICAM ALLERGY RELIEF NA) Place into the nose.   . [DISCONTINUED] lidocaine (LIDODERM) 5 % Place 1 patch onto the skin every 12 (twelve) hours. Remove &  Discard patch within 12 hours or as directed by MD   . [DISCONTINUED] mupirocin ointment (BACTROBAN) 2 % Apply 1 application topically 2 (two) times daily. (Patient not taking: Reported on 04/11/2019)   . [DISCONTINUED] naproxen (NAPROSYN) 500 MG tablet Take 1 tablet (500 mg total) by mouth 2 (two) times daily with a meal. (Patient not taking: Reported on 04/11/2019)    No facility-administered encounter medications on file as of 04/11/2019.     Surgical  History: Past Surgical History:  Procedure Laterality Date  . ABDOMINAL HYSTERECTOMY    . COLONOSCOPY  06/08/2013   Dr Candace Cruise- small mouth diverticula  . HIP SURGERY Left   . KNEE SURGERY Bilateral   . NASAL RECONSTRUCTION     x 2    Medical History: Past Medical History:  Diagnosis Date  . Anxiety   . Asthma   . 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 COPD (chronic obstructive pulmonary disease) ([place])   . Depression   . Diverticula, colon   . Diverticulosis   . GERD (gastroesophageal reflux disease)   . Sleep apnea     Family History: Family History  Problem Relation Age of Onset  . Cirrhosis Father   . Coronary artery disease Mother   . Breast cancer Neg Hx     Social History: Social History   Socioeconomic History  . Marital status: Divorced    Spouse name: Not on file  . Number of children: Not on file  . Years of education: Not on file  . Highest education level: High school graduate  Occupational History  . Occupation: retired  Scientific laboratory technician  . Financial resource strain: Not hard at all  . Food insecurity:    Worry: Never true    Inability: Never true  . Transportation needs:    Medical: No    Non-medical: No  Tobacco Use  . Smoking status: Former Research scientist (life sciences)  . Smokeless tobacco: Never Used  Substance and Sexual Activity  . Alcohol use: No    Alcohol/week: 0.0 standard drinks  . Drug use: No  . Sexual activity: Never    Birth control/protection: Post-menopausal  Lifestyle  . Physical activity:    Days per week: 0 days    Minutes per session: 0 min  . Stress: Patient refused  Relationships  . Social connections:    Talks on phone: More than three times a week    Gets together: More than three times a week    Attends religious service: Never    Active member of club or organization: No    Attends meetings of clubs or organizations: Never    Relationship status: Divorced  . Intimate partner violence:    Fear of current or ex partner: No    Emotionally  abused: No    Physically abused: No    Forced sexual activity: No  Other Topics Concern  . Not on file  Social History Narrative  . Not on file    Vital Signs: Blood pressure (!) 121/54, pulse 71, resp. rate 16, height 5\' 3"  (1.6 m), weight 188 lb 6.4 oz (85.5 kg), SpO2 96 %.  Examination: General Appearance: The patient is well-developed, well-nourished, and in no distress. Skin: Gross inspection of skin unremarkable. Head: normocephalic, no gross deformities. Eyes: no gross deformities noted. ENT: ears appear grossly normal no exudates. Neck: Supple. No thyromegaly. No LAD. Respiratory: clear bilateraly. Cardiovascular: Normal S1 and S2 without murmur or rub. Extremities: No cyanosis. pulses are equal. Neurologic: Alert and oriented. No  involuntary movements.  LABS: No results found for this or any previous visit (from the past 2160 hour(s)).  Radiology: Dg Chest 2 View  Result Date: 09/28/2018 CLINICAL DATA:  Followup atelectasis noted on prior radiographs. History of COPD with intermittent shortness of breath. EXAM: CHEST - 2 VIEW COMPARISON:  12/23/2016 FINDINGS: Cardiac silhouette is normal in size. No mediastinal or hilar masses. No evidence of adenopathy. There are low lung volumes, particularly on the right where the hemidiaphragm is elevated. There is streaky opacity at both lung bases, also greater on the right, consistent with atelectasis. These findings are without substantial change from the prior exam. Remainder of the lungs is clear. No pleural effusion or pneumothorax. Skeletal structures are demineralized but intact. IMPRESSION: No active cardiopulmonary disease. Electronically Signed   By: Lajean Manes M.D.   On: 09/28/2018 12:32    No results found.  No results found.    Assessment and Plan: Patient Active Problem List   Diagnosis Date Noted  . Mild episode of recurrent major depressive disorder ([place]) 03/23/2018  . Anxiety 03/23/2018  . Taking  medication for chronic disease 03/23/2018  . Other constipation 12/13/2017  . Depression with anxiety 08/19/2017  . Hyperlipidemia 08/19/2017  . Gastroesophageal reflux disease without esophagitis 08/19/2017  . Other chest pain 04/16/2015  . COPD exacerbation ([place]) 04/16/2015  . Sleep apnea 04/16/2015  . 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 Asthmatic bronchitis 04/16/2015  . Abdominal pain, LLQ 03/29/2014  . Rectal bleeding 03/29/2014  . 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 Osteoarthrosis, unspecified whether generalized or localized, pelvic region and thigh 03/26/2014   1. OSA on CPAP Continue using cpap as discussed.  Good compliance at this time.  Will follow up in 3 months.   2. Obstructive chronic bronchitis without exacerbation ([place]) Refilled patients Breo.  Will continue or COPD at this time.  - fluticasone furoate-vilanterol (BREO ELLIPTA) 100-25 MCG/INH AEPB; Inhale 1 puff by mouth once daily  Dispense: 60 each; Refill: 3  3. Shortness of breath Spirometry appears stable at this time.  - Spirometry with Graph   General Counseling: I have discussed the findings of the evaluation and examination with [place].  I have also discussed any further diagnostic evaluation thatmay be needed or ordered today. Latrecia verbalizes understanding of the findings of todays visit. We also reviewed her medications today and discussed drug interactions and side effects including but not limited excessive drowsiness and altered mental states. We also discussed that there is always a risk not just to her but also people around her. she has been encouraged to call the office with any questions or concerns that should arise related to todays visit.    Time spent: 20 This patient was seen by Orson Gear AGNP-C in Collaboration with Dr. Devona Konig as a part of collaborative care agreement.   I have personally obtained a history, examined the patient, evaluated laboratory and imaging results,  formulated the assessment and plan and placed orders.    [name], MD [facility] Pulmonary and Critical Care Sleep medicine

## 2019-04-19 ENCOUNTER — Other Ambulatory Visit: Payer: Self-pay | Admitting: Family Medicine

## 2019-04-19 DIAGNOSIS — E785 Hyperlipidemia, unspecified: Secondary | ICD-10-CM

## 2019-04-24 ENCOUNTER — Other Ambulatory Visit: Payer: Self-pay | Admitting: Family Medicine

## 2019-04-24 DIAGNOSIS — K219 Gastro-esophageal reflux disease without esophagitis: Secondary | ICD-10-CM

## 2019-04-28 ENCOUNTER — Other Ambulatory Visit: Payer: Self-pay | Admitting: Family Medicine

## 2019-04-28 ENCOUNTER — Other Ambulatory Visit: Payer: Self-pay

## 2019-04-28 DIAGNOSIS — Z1231 Encounter for screening mammogram for malignant neoplasm of breast: Secondary | ICD-10-CM

## 2019-05-04 ENCOUNTER — Other Ambulatory Visit: Payer: Self-pay | Admitting: Family Medicine

## 2019-05-04 DIAGNOSIS — J309 Allergic rhinitis, unspecified: Secondary | ICD-10-CM

## 2019-05-10 ENCOUNTER — Other Ambulatory Visit: Payer: Self-pay

## 2019-05-10 ENCOUNTER — Ambulatory Visit
Admission: RE | Admit: 2019-05-10 | Discharge: 2019-05-10 | Disposition: A | Discharge status: Home / Self Care | Payer: Medicare Other | Source: Ambulatory Visit | Attending: Family Medicine | Admitting: Family Medicine

## 2019-05-10 DIAGNOSIS — Z1231 Encounter for screening mammogram for malignant neoplasm of breast: Secondary | ICD-10-CM | POA: Diagnosis not present

## 2019-05-17 IMAGING — CT CT ABD-PELV W/ CM
2 of 5 series · 15 of 46 positions shown, 17 images · IV contrast (APPLIED)
Comparison: Acute abdominal series July 20, 2017 at 3666 hours
and CT abdomen and pelvis April 02, 2014

CLINICAL DATA: Generalized abdominal pain and constipation for 4
days. History of diverticulosis, cancer.

EXAM:
CT ABDOMEN AND PELVIS WITH CONTRAST
TECHNIQUE: Multidetector CT imaging of the abdomen and pelvis was performed
using the standard protocol following bolus administration of
intravenous contrast.
CONTRAST:  100mL HU3URS-UXX IOPAMIDOL (HU3URS-UXX) INJECTION 61%

[Series 2: axial st · axial · 0.89mm/px · z∈[-401,+104]mm · 12 of 113 slices shown, 14 images]
[im 6/113  soft-tissue]
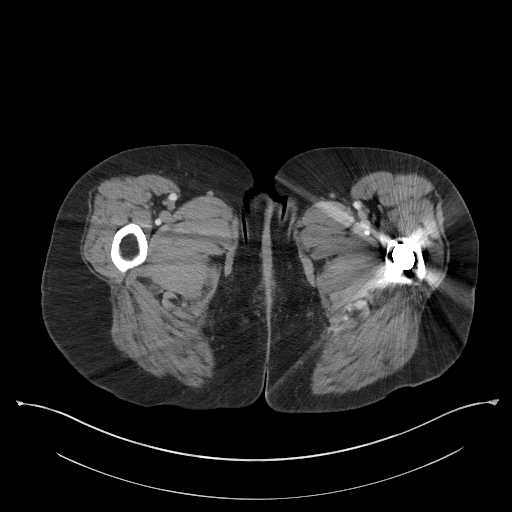
[im 6/113  bone]
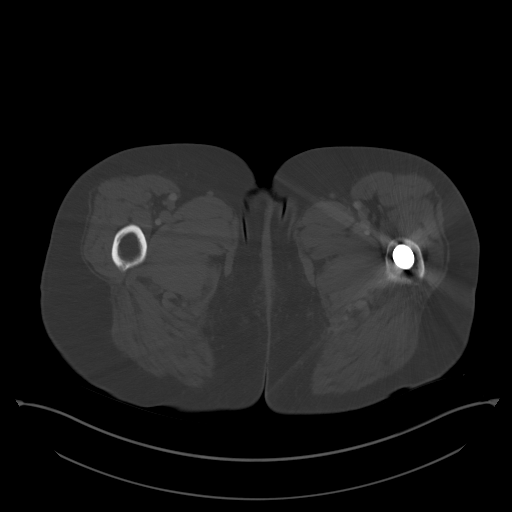
[im 18/113  soft-tissue]
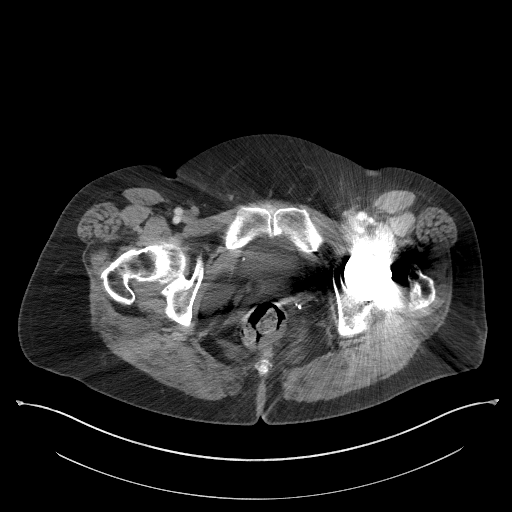
[im 24/113  soft-tissue]
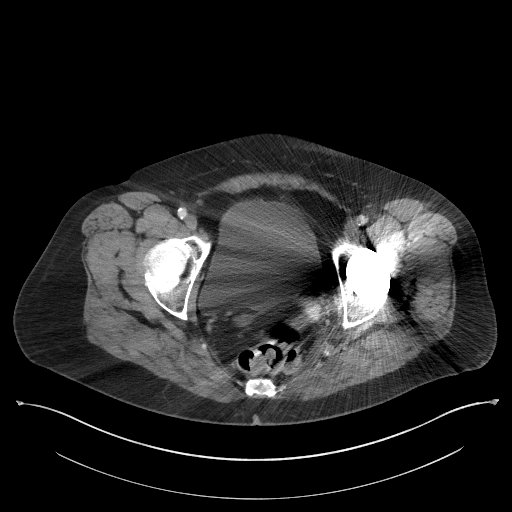
[im 36/113  soft-tissue]
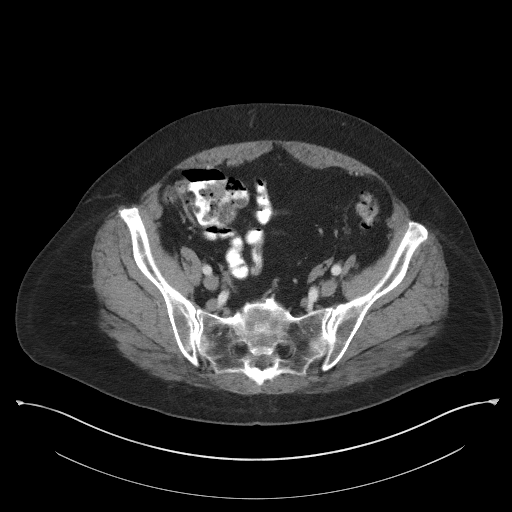
[im 42/113  soft-tissue]
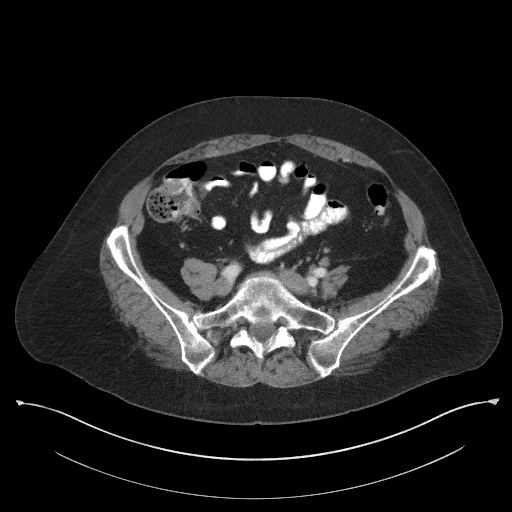
[im 54/113  soft-tissue]
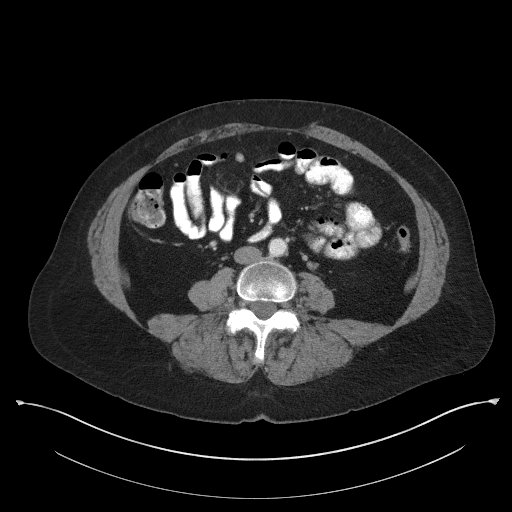
[im 59/113  soft-tissue]
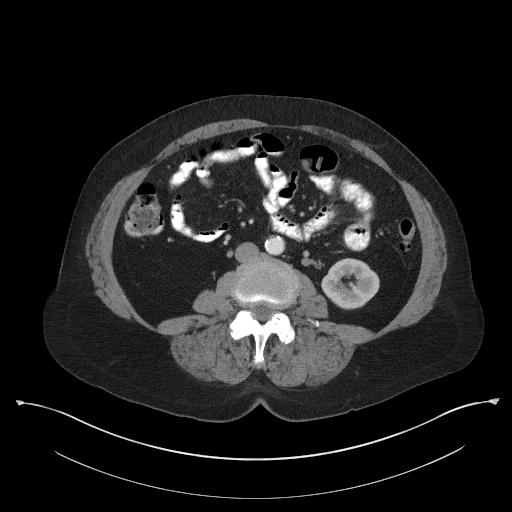
[im 71/113  soft-tissue]
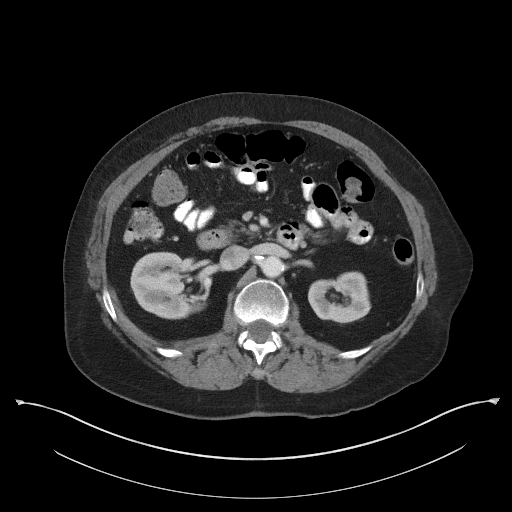
[im 77/113  soft-tissue]
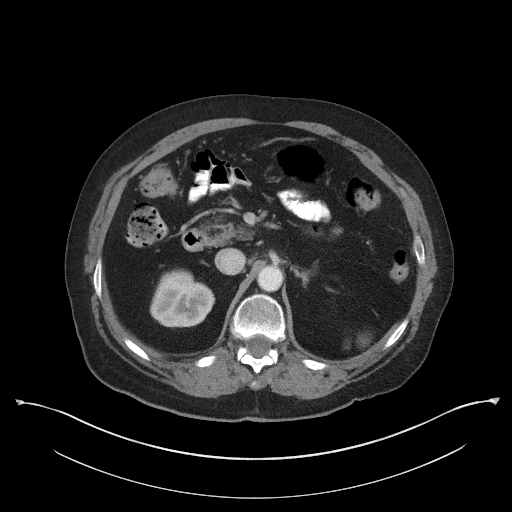
[im 77/113  bone]
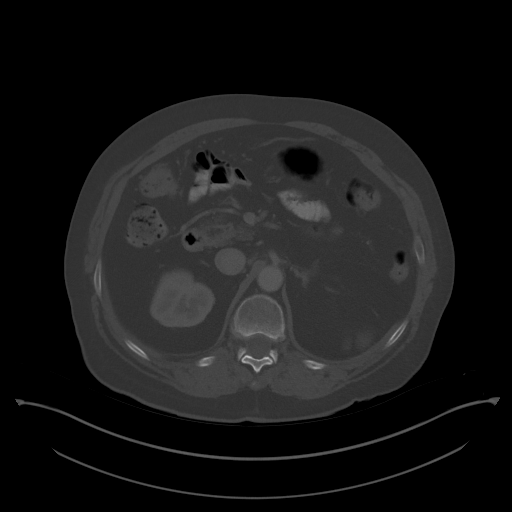
[im 89/113  soft-tissue]
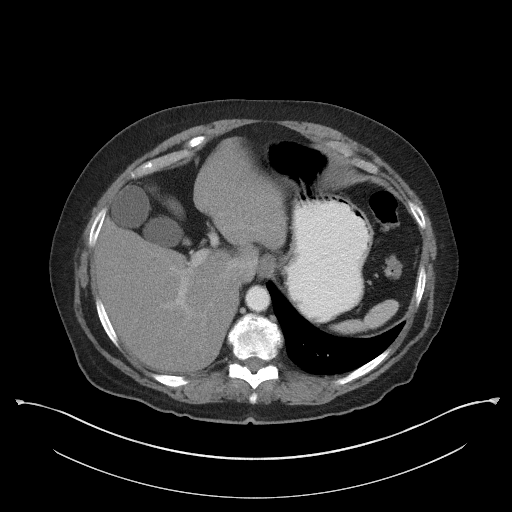
[im 95/113  soft-tissue]
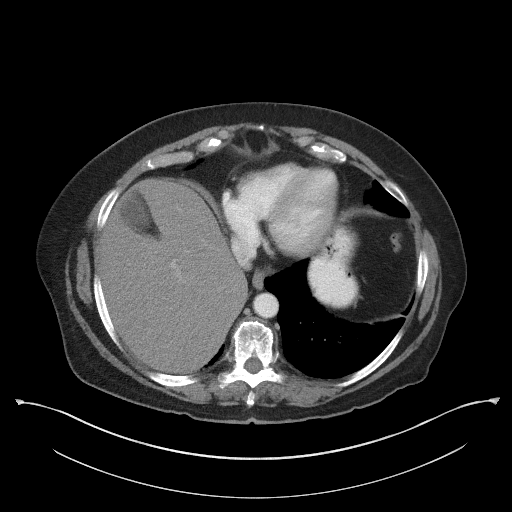
[im 107/113  soft-tissue]
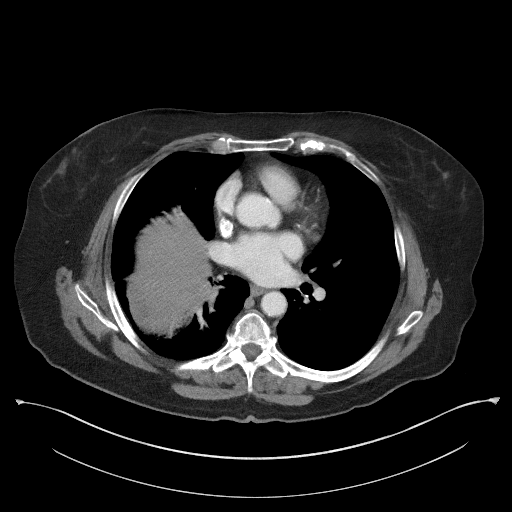

[Series 5: coronal st · coronal · 0.77mm/px · 3 of 96 slices shown]
[im 32/96  soft-tissue]
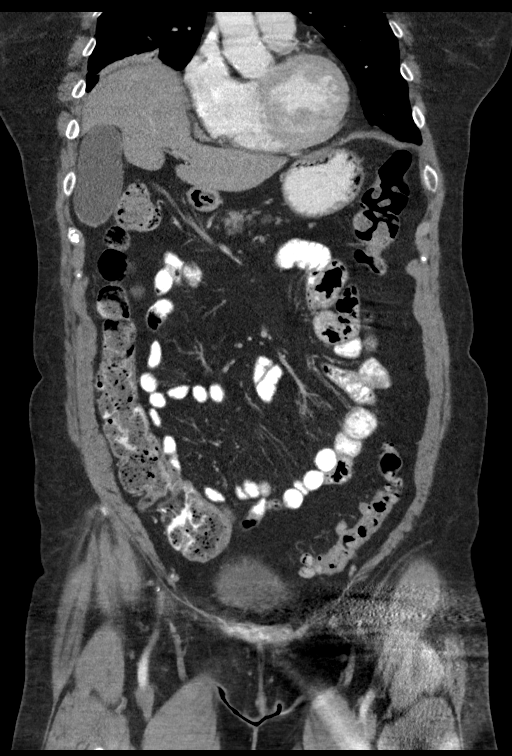
[im 43/96  soft-tissue]
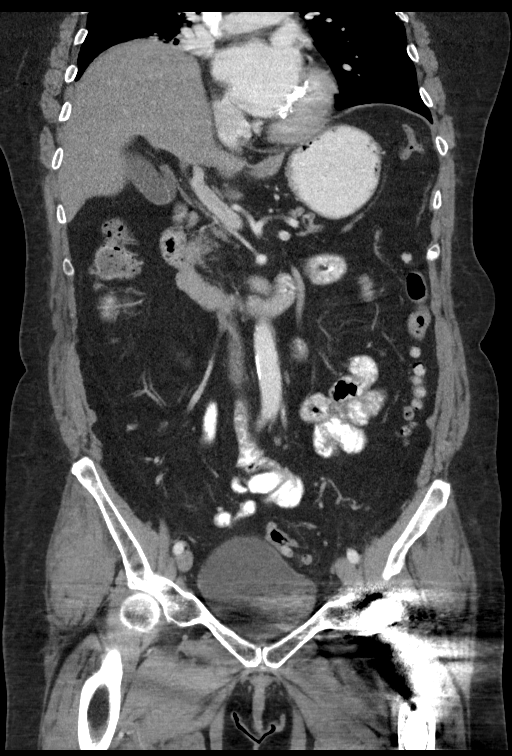
[im 53/96  soft-tissue]
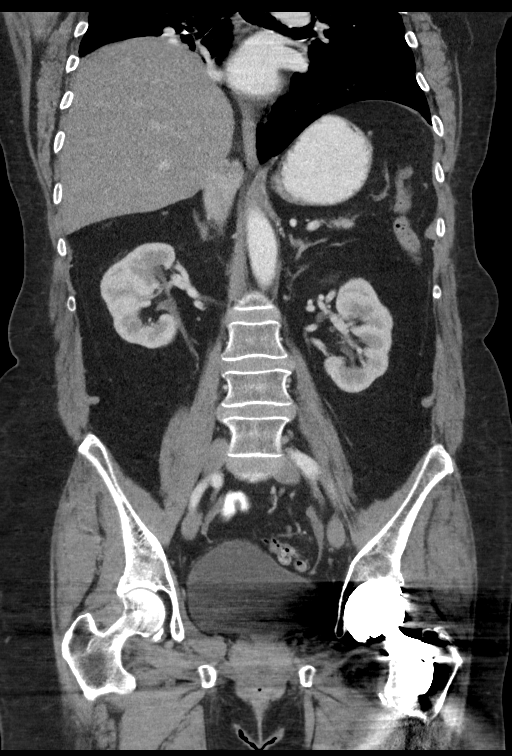

[15 of 46 positions shown; findings below may reference images not displayed]

FINDINGS: LOWER CHEST: Elevated RIGHT hemidiaphragm with RIGHT lung base
enhancing atelectasis. LEFT lower lobe atelectasis/ scarring. Heart
size is normal. Mitral annular calcifications. No pericardial
effusion.

HEPATOBILIARY: The liver is diffusely hypodense compatible with
steatosis. Dependent 2 cm gallstone without CT findings of acute
cholecystitis.

PANCREAS: Normal.

SPLEEN: Normal.

ADRENALS/URINARY TRACT: Kidneys are orthotopic, demonstrating
symmetric enhancement. No nephrolithiasis, hydronephrosis or solid
renal masses. Too small to characterize hypodensities upper pole
bilateral kidneys. LEFT extra renal pelvis. The unopacified ureters
are normal in course and caliber. Delayed imaging through the
kidneys demonstrates symmetric prompt contrast excretion within the
proximal urinary collecting system. Urinary bladder is partially
distended and unremarkable. Normal adrenal glands.

STOMACH/BOWEL: The stomach, small and large bowel are normal in
course and caliber without inflammatory changes. Small P volume
retained large bowel stool. Severe descending and sigmoid colonic
diverticulosis. Normal appendix.

VASCULAR/LYMPHATIC: Aortoiliac vessels are normal in course and
caliber. Mild calcific atherosclerosis. No lymphadenopathy by CT
size criteria.

REPRODUCTIVE: Status post hysterectomy.

OTHER: No intraperitoneal free fluid or free air.

MUSCULOSKELETAL: Nonacute. Streak artifact from LEFT hip
arthroplasty. 1 cm bone island RIGHT sacrum. Small fat containing
umbilical hernia. Minimal grade 1 L four 5 anterolisthesis without
spondylolysis. Moderate degenerative change of thoracic spine.
IMPRESSION: 1. Severe colonic diverticulosis and small amount of retained large
bowel stool without acute diverticulitis nor bowel obstruction.
2. Cholelithiasis without acute cholecystitis.

Aortic Atherosclerosis ([id number]).

## 2019-06-08 ENCOUNTER — Other Ambulatory Visit: Payer: Self-pay | Admitting: Family Medicine

## 2019-06-25 ENCOUNTER — Other Ambulatory Visit: Payer: Self-pay | Admitting: Family Medicine

## 2019-06-25 DIAGNOSIS — E785 Hyperlipidemia, unspecified: Secondary | ICD-10-CM

## 2019-06-25 DIAGNOSIS — J309 Allergic rhinitis, unspecified: Secondary | ICD-10-CM

## 2019-07-12 DIAGNOSIS — G4733 Obstructive sleep apnea (adult) (pediatric): Secondary | ICD-10-CM | POA: Diagnosis not present

## 2019-07-13 ENCOUNTER — Ambulatory Visit: Payer: Medicare Other | Admitting: Internal Medicine

## 2019-07-25 ENCOUNTER — Other Ambulatory Visit: Payer: Self-pay

## 2019-07-25 ENCOUNTER — Other Ambulatory Visit: Payer: Self-pay | Admitting: Family Medicine

## 2019-07-25 DIAGNOSIS — K219 Gastro-esophageal reflux disease without esophagitis: Secondary | ICD-10-CM

## 2019-07-25 DIAGNOSIS — E785 Hyperlipidemia, unspecified: Secondary | ICD-10-CM

## 2019-07-25 DIAGNOSIS — F320 Major depressive disorder, single episode, mild: Secondary | ICD-10-CM

## 2019-07-25 MED ORDER — PANTOPRAZOLE SODIUM 40 MG PO TBEC: 40.0000 mg | DELAYED_RELEASE_TABLET | Freq: Every day | ORAL | 0 refills | Status: DC

## 2019-07-25 MED ORDER — FLUOXETINE HCL 10 MG PO CAPS: 10.0000 mg | ORAL_CAPSULE | Freq: Every day | ORAL | 0 refills | Status: DC

## 2019-07-25 MED ORDER — GEMFIBROZIL 600 MG PO TABS: 600.0000 mg | ORAL_TABLET | Freq: Two times a day (BID) | ORAL | 0 refills | Status: DC

## 2019-07-27 ENCOUNTER — Ambulatory Visit: Payer: Medicare Other | Admitting: Internal Medicine

## 2019-07-31 ENCOUNTER — Other Ambulatory Visit: Payer: Self-pay

## 2019-07-31 ENCOUNTER — Encounter: Payer: Self-pay | Admitting: Family Medicine

## 2019-07-31 ENCOUNTER — Ambulatory Visit (INDEPENDENT_AMBULATORY_CARE_PROVIDER_SITE_OTHER): Payer: Medicare Other | Admitting: Family Medicine

## 2019-07-31 VITALS — BP 124/80 | HR 76 | Ht 63.0 in | Wt 188.0 lb

## 2019-07-31 DIAGNOSIS — F320 Major depressive disorder, single episode, mild: Secondary | ICD-10-CM

## 2019-07-31 DIAGNOSIS — R69 Illness, unspecified: Secondary | ICD-10-CM | POA: Diagnosis not present

## 2019-07-31 DIAGNOSIS — E785 Hyperlipidemia, unspecified: Secondary | ICD-10-CM | POA: Diagnosis not present

## 2019-07-31 DIAGNOSIS — E66811 Obesity, class 1: Secondary | ICD-10-CM

## 2019-07-31 DIAGNOSIS — E669 Obesity, unspecified: Secondary | ICD-10-CM

## 2019-07-31 DIAGNOSIS — Z23 Encounter for immunization: Secondary | ICD-10-CM | POA: Diagnosis not present

## 2019-07-31 DIAGNOSIS — K219 Gastro-esophageal reflux disease without esophagitis: Secondary | ICD-10-CM

## 2019-07-31 DIAGNOSIS — F419 Anxiety disorder, unspecified: Secondary | ICD-10-CM

## 2019-07-31 MED ORDER — FLUOXETINE HCL 20 MG PO CAPS: 20.0000 mg | ORAL_CAPSULE | Freq: Every day | ORAL | 5 refills | Status: DC

## 2019-07-31 MED ORDER — PANTOPRAZOLE SODIUM 40 MG PO TBEC: 40.0000 mg | DELAYED_RELEASE_TABLET | Freq: Every day | ORAL | 1 refills | Status: DC

## 2019-07-31 MED ORDER — GEMFIBROZIL 600 MG PO TABS: 600.0000 mg | ORAL_TABLET | Freq: Two times a day (BID) | ORAL | 1 refills | Status: DC

## 2019-07-31 MED ORDER — FLUOXETINE HCL 10 MG PO CAPS: 10.0000 mg | ORAL_CAPSULE | Freq: Every day | ORAL | 5 refills | Status: DC

## 2019-07-31 NOTE — Patient Instructions (Signed)

## 2019-07-31 NOTE — Progress Notes (Signed)
Date:  07/31/2019   Name:  Regina Macdonald   DOB:  [birth year]   MRN:  [id number]   Chief Complaint: Depression (PHQ9=0 and GAD7=2), Gastroesophageal Reflux, Hyperlipidemia, and influenza vacc need  Depression        This is a chronic problem.  The current episode started more than 1 year ago.   The onset quality is gradual.   The problem occurs intermittently.  The problem has been waxing and waning since onset.  Associated symptoms include sad.  Associated symptoms include no decreased concentration, no fatigue, no helplessness, no hopelessness, does not have insomnia, not irritable, no restlessness, no decreased interest, no appetite change, no body aches, no myalgias, no headaches, no indigestion and no suicidal ideas.     Exacerbated by: medical concern for loved one.  Past treatments include SSRIs - Selective serotonin reuptake inhibitors.  Compliance with treatment is good.  Past medical history includes anxiety.     Pertinent negatives include no hypothyroidism. Gastroesophageal Reflux She reports no abdominal pain, no belching, no chest pain, no choking, no coughing, no dysphagia, no early satiety, no globus sensation, no heartburn, no hoarse voice, no nausea, no sore throat, no stridor, no water brash or no wheezing. This is a recurrent problem. The problem has been waxing and waning. The symptoms are aggravated by certain foods. Pertinent negatives include no fatigue. Risk factors include obesity. She has tried a PPI for the symptoms. The treatment provided moderate relief.  Hyperlipidemia This is a chronic problem. The current episode started more than 1 year ago. The problem is controlled. Recent lipid tests were reviewed and are normal. Exacerbating diseases include obesity. She has no history of chronic renal disease, diabetes, hypothyroidism or liver disease. Pertinent negatives include no chest pain, myalgias or shortness of breath. Current antihyperlipidemic treatment includes  statins. The current treatment provides moderate improvement of lipids. There are no compliance problems.  Risk factors for coronary artery disease include post-menopausal, stress and dyslipidemia.  Anxiety Presents for follow-up visit. Symptoms include excessive worry and nervous/anxious behavior. Patient reports no chest pain, compulsions, confusion, decreased concentration, depressed mood, dizziness, dry mouth, feeling of choking, hyperventilation, impotence, insomnia, irritability, malaise, muscle tension, nausea, obsessions, palpitations, panic, restlessness, shortness of breath or suicidal ideas. Symptoms occur occasionally. The severity of symptoms is moderate.      Review of Systems  Constitutional: Negative.  Negative for appetite change, chills, fatigue, fever, irritability and unexpected weight change.  HENT: Negative for congestion, ear discharge, ear pain, hoarse voice, rhinorrhea, sinus pressure, sneezing and sore throat.   Eyes: Negative for photophobia, pain, discharge, redness and itching.  Respiratory: Negative for cough, choking, shortness of breath, wheezing and stridor.   Cardiovascular: Negative for chest pain and palpitations.  Gastrointestinal: Negative for abdominal pain, blood in stool, constipation, diarrhea, dysphagia, heartburn, nausea and vomiting.  Endocrine: Negative for cold intolerance, heat intolerance, polydipsia, polyphagia and polyuria.  Genitourinary: Negative for dysuria, flank pain, frequency, hematuria, impotence, menstrual problem, pelvic pain, urgency, vaginal bleeding and vaginal discharge.  Musculoskeletal: Negative for arthralgias, back pain and myalgias.  Skin: Negative for rash.  Allergic/Immunologic: Negative for environmental allergies and food allergies.  Neurological: Negative for dizziness, weakness, light-headedness, numbness and headaches.  Hematological: Negative for adenopathy. Does not bruise/bleed easily.  Psychiatric/Behavioral:  Positive for depression. Negative for confusion, decreased concentration, dysphoric mood and suicidal ideas. The patient is nervous/anxious. The patient does not have insomnia.     Patient Active Problem List   Diagnosis Date  Noted  . Mild episode of recurrent major depressive disorder ([place]) 03/23/2018  . Anxiety 03/23/2018  . Taking medication for chronic disease 03/23/2018  . Other constipation 12/13/2017  . Depression with anxiety 08/19/2017  . Hyperlipidemia 08/19/2017  . Gastroesophageal reflux disease without esophagitis 08/19/2017  . Other chest pain 04/16/2015  . COPD exacerbation ([place]) 04/16/2015  . Sleep apnea 04/16/2015  . 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 Asthmatic bronchitis 04/16/2015  . Abdominal pain, LLQ 03/29/2014  . Rectal bleeding 03/29/2014  . 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 Osteoarthrosis, unspecified whether generalized or localized, pelvic region and thigh 03/26/2014    Allergies  Allergen Reactions  . Ciprofloxacin Other (See Comments)  . Etodolac Other (See Comments)  . Sulfa Antibiotics Rash    Past Surgical History:  Procedure Laterality Date  . ABDOMINAL HYSTERECTOMY    . COLONOSCOPY  06/08/2013   Dr Candace Cruise- small mouth diverticula  . HIP SURGERY Left   . KNEE SURGERY Bilateral   . NASAL RECONSTRUCTION     x 2    Social History   Tobacco Use  . Smoking status: Former Research scientist (life sciences)  . Smokeless tobacco: Never Used  Substance Use Topics  . Alcohol use: No    Alcohol/week: 0.0 standard drinks  . Drug use: No     Medication list has been reviewed and updated.  Current Meds  Medication Sig  . acetaminophen (TYLENOL) 500 MG tablet Take 500 mg by mouth every 6 (six) hours as needed.  Marland Kitchen aspirin 81 MG chewable tablet Chew by mouth.  Marland Kitchen CALCIUM & MAGNESIUM CARBONATES PO Take 1 tablet by mouth daily.  . Cyanocobalamin (VITAMIN B-12) 1000 MCG SUBL 1 tablet daily.  Marland Kitchen EQUATE STOOL SOFTENER 100 MG capsule Take 1 capsule by mouth twice daily  .  FLUoxetine (PROZAC) 10 MG capsule Take 1 capsule (10 mg total) by mouth daily.  Marland Kitchen FLUoxetine (PROZAC) 20 MG capsule Take 1 capsule by mouth once daily  . fluticasone (FLONASE) 50 MCG/ACT nasal spray Use 2 spray(s) in each nostril once daily  . fluticasone furoate-vilanterol (BREO ELLIPTA) 100-25 MCG/INH AEPB Inhale 1 puff by mouth once daily  . gemfibrozil (LOPID) 600 MG tablet Take 1 tablet (600 mg total) by mouth 2 (two) times daily.  Marland Kitchen loratadine (CLARITIN) 10 MG tablet Take 1 tablet (10 mg total) by mouth daily.  . Multiple Vitamins-Minerals (CENTRUM SILVER PO) Take 1 capsule by mouth daily.  . pantoprazole (PROTONIX) 40 MG tablet Take 1 tablet (40 mg total) by mouth daily.  . polyethylene glycol (MIRALAX) packet Take 17 g by mouth daily.    PHQ 2/9 Scores 07/31/2019 01/16/2019 09/29/2018 09/28/2018  PHQ - 2 Score 0 0 2 0  PHQ- 9 Score 0 - 4 -    BP Readings from Last 3 Encounters:  07/31/19 124/80  04/11/19 (!) 121/54  01/16/19 128/80    Physical Exam Vitals signs and nursing note reviewed.  Constitutional:      General: She is not irritable.She is not in acute distress.    Appearance: She is not diaphoretic.  HENT:     Head: Normocephalic and atraumatic.     Right Ear: External ear normal.     Left Ear: External ear normal.     Nose: Nose normal.  Eyes:     General:        Right eye: No discharge.        Left eye: No discharge.     Conjunctiva/sclera: Conjunctivae normal.  Pupils: Pupils are equal, round, and reactive to light.  Neck:     Musculoskeletal: Normal range of motion and neck supple.     Thyroid: No thyromegaly.     Vascular: No JVD.  Cardiovascular:     Rate and Rhythm: Normal rate and regular rhythm.     Heart sounds: Normal heart sounds, S1 normal and S2 normal. No murmur. No systolic murmur. No diastolic murmur. No friction rub. No gallop. No S3 or S4 sounds.   Pulmonary:     Effort: Pulmonary effort is normal.     Breath sounds: Normal breath  sounds. No decreased breath sounds, wheezing, rhonchi or rales.  Abdominal:     General: Bowel sounds are normal.     Palpations: Abdomen is soft. There is no mass.     Tenderness: There is no abdominal tenderness. There is no guarding.  Musculoskeletal: Normal range of motion.     Right lower leg: No edema.     Left lower leg: No edema.  Lymphadenopathy:     Cervical: No cervical adenopathy.  Skin:    General: Skin is warm and dry.  Neurological:     Mental Status: She is alert.     Deep Tendon Reflexes: Reflexes are normal and symmetric.     Wt Readings from Last 3 Encounters:  07/31/19 188 lb (85.3 kg)  04/11/19 188 lb 6.4 oz (85.5 kg)  01/16/19 189 lb (85.7 kg)    BP 124/80   Pulse 76   Ht 5\' 3"  (1.6 m)   Wt 188 lb (85.3 kg)   BMI 33.30 kg/m   Assessment and Plan:  1. Current mild episode of major depressive disorder, unspecified whether recurrent (HCC) Chronic.  Controlled.  Patient relates no concerns.  Her PHQ was noted to be 0.  We will continue fluoxetine 30 mg once a day. - FLUoxetine (PROZAC) 10 MG capsule; Take 1 capsule (10 mg total) by mouth daily.  Dispense: 30 capsule; Refill: 5 - FLUoxetine (PROZAC) 20 MG capsule; Take 1 capsule (20 mg total) by mouth daily.  Dispense: 30 capsule; Refill: 5  2. Hyperlipidemia, unspecified hyperlipidemia type Chronic.  Controlled.  Continue gemfibrozil 600 mg twice daily along with obtaining a lipid panel for evaluation of lipid status. - gemfibrozil (LOPID) 600 MG tablet; Take 1 tablet (600 mg total) by mouth 2 (two) times daily.  Dispense: 180 tablet; Refill: 1 - Lipid Panel With LDL/HDL Ratio  3. Gastroesophageal reflux disease without esophagitis Chronic.  Controlled.  Continue pantoprazole 40 mg once a day. - pantoprazole (PROTONIX) 40 MG tablet; Take 1 tablet (40 mg total) by mouth daily.  Dispense: 90 tablet; Refill: 1  4. Taking medication for chronic disease Discussed and obtained. - Comprehensive Metabolic  Panel (CMET)  5. Anxiety Chronic.  Relatively controlled.  Gad score is noted to be 2 patient is particularly concerned about the medical status of the family member.  We will continue fluoxetine 30 mg once a day. - FLUoxetine (PROZAC) 10 MG capsule; Take 1 capsule (10 mg total) by mouth daily.  Dispense: 30 capsule; Refill: 5 - FLUoxetine (PROZAC) 20 MG capsule; Take 1 capsule (20 mg total) by mouth daily.  Dispense: 30 capsule; Refill: 5  6. Obesity (BMI 30.0-34.9) Health risks of being over weight were discussed and patient was counseled on weight loss options and exercise.  Patient was given Mediterranean diet.  7. Influenza vaccine needed Discussed and administered. - Flu Vaccine QUAD High Dose(Fluad)

## 2019-08-01 LAB — COMPREHENSIVE METABOLIC PANEL
ALT: 11 IU/L (ref 0–32)
AST: 21 IU/L (ref 0–40)
Albumin/Globulin Ratio: 2.1 (ref 1.2–2.2)
Albumin: 4.8 g/dL — ABNORMAL HIGH (ref 3.7–4.7)
Alkaline Phosphatase: 112 IU/L (ref 39–117)
BUN/Creatinine Ratio: 18 (ref 12–28)
BUN: 13 mg/dL (ref 8–27)
Bilirubin Total: 0.3 mg/dL (ref 0.0–1.2)
CO2: 24 mmol/L (ref 20–29)
Calcium: 9.3 mg/dL (ref 8.7–10.3)
Chloride: 102 mmol/L (ref 96–106)
Creatinine, Ser: 0.74 mg/dL (ref 0.57–1.00)
GFR calc Af Amer: 88 mL/min/{1.73_m2} (ref >59)
GFR calc non Af Amer: 77 mL/min/{1.73_m2} (ref >59)
Globulin, Total: 2.3 g/dL (ref 1.5–4.5)
Glucose: 86 mg/dL (ref 65–99)
Potassium: 4.6 mmol/L (ref 3.5–5.2)
Sodium: 138 mmol/L (ref 134–144)
Total Protein: 7.1 g/dL (ref 6.0–8.5)

## 2019-08-01 LAB — LIPID PANEL WITH LDL/HDL RATIO
Cholesterol, Total: 147 mg/dL (ref 100–199)
HDL: 45 mg/dL (ref >39)
LDL Chol Calc (NIH): 86 mg/dL (ref 0–99)
LDL/HDL Ratio: 1.9 ratio (ref 0.0–3.2)
Triglycerides: 84 mg/dL (ref 0–149)
VLDL Cholesterol Cal: 16 mg/dL (ref 5–40)

## 2019-08-09 ENCOUNTER — Other Ambulatory Visit: Payer: Self-pay | Admitting: Family Medicine

## 2019-08-09 DIAGNOSIS — J309 Allergic rhinitis, unspecified: Secondary | ICD-10-CM

## 2019-08-14 ENCOUNTER — Other Ambulatory Visit: Payer: Self-pay

## 2019-08-14 ENCOUNTER — Ambulatory Visit: Payer: Medicare Other | Admitting: Internal Medicine

## 2019-08-14 ENCOUNTER — Encounter: Payer: Self-pay | Admitting: Internal Medicine

## 2019-08-14 VITALS — BP 128/68 | HR 70 | Resp 16 | Ht 63.0 in | Wt 192.0 lb

## 2019-08-14 DIAGNOSIS — J449 Chronic obstructive pulmonary disease, unspecified: Secondary | ICD-10-CM | POA: Diagnosis not present

## 2019-08-14 DIAGNOSIS — G4733 Obstructive sleep apnea (adult) (pediatric): Secondary | ICD-10-CM | POA: Diagnosis not present

## 2019-08-14 DIAGNOSIS — Z9989 Dependence on other enabling machines and devices: Secondary | ICD-10-CM | POA: Diagnosis not present

## 2019-08-14 DIAGNOSIS — R0602 Shortness of breath: Secondary | ICD-10-CM | POA: Diagnosis not present

## 2019-08-14 MED ORDER — BREO ELLIPTA 100-25 MCG/INH IN AEPB: INHALATION_SPRAY | RESPIRATORY_TRACT | 3 refills | Status: DC

## 2019-08-14 NOTE — Progress Notes (Signed)
[facility] [place], [place] [address]  Pulmonary Sleep Medicine   Office Visit Note  Patient Name: Regina Macdonald DOB: 19-Apr-1941 MRN [id number]  Date of Service: 08/14/2019  Complaints/HPI: Pt is here for follow up.  She is using her cpap at night.  She is cleaning her machine by hand.  She is changing her filters and tubing as prescribed.  She denies any issues with her treatment.  She gets good relief of symptoms while using it. She continues to use breo daily, with good results.   ROS  General: (-) fever, (-) chills, (-) night sweats, (-) weakness Skin: (-) rashes, (-) itching,. Eyes: (-) visual changes, (-) redness, (-) itching. Nose and Sinuses: (-) nasal stuffiness or itchiness, (-) postnasal drip, (-) nosebleeds, (-) sinus trouble. Mouth and Throat: (-) sore throat, (-) hoarseness. Neck: (-) swollen glands, (-) enlarged thyroid, (-) neck pain. Respiratory: - cough, (-) bloody sputum, - shortness of breath, - wheezing. Cardiovascular: - ankle swelling, (-) chest pain. Lymphatic: (-) lymph node enlargement. Neurologic: (-) numbness, (-) tingling. Psychiatric: (-) anxiety, (-) depression   Current Medication: Outpatient Encounter Medications as of 08/14/2019  Medication Sig Note  . acetaminophen (TYLENOL) 500 MG tablet Take 500 mg by mouth every 6 (six) hours as needed.   Marland Kitchen aspirin 81 MG chewable tablet Chew by mouth.   Marland Kitchen CALCIUM & MAGNESIUM CARBONATES PO Take 1 tablet by mouth daily. 04/09/2015: Received from: [place]:   . Cyanocobalamin (VITAMIN B-12) 1000 MCG SUBL 1 tablet daily. 04/09/2015: Received from: [facility]:   . EQUATE STOOL SOFTENER 100 MG capsule Take 1 capsule by mouth twice daily   . FLUoxetine (PROZAC) 10 MG capsule Take 1 capsule (10 mg total) by mouth daily.   Marland Kitchen FLUoxetine (PROZAC) 20 MG capsule Take 1 capsule (20 mg total) by mouth daily.   . fluticasone  (FLONASE) 50 MCG/ACT nasal spray Use 2 spray(s) in each nostril once daily   . fluticasone furoate-vilanterol (BREO ELLIPTA) 100-25 MCG/INH AEPB Inhale 1 puff by mouth once daily   . gemfibrozil (LOPID) 600 MG tablet Take 1 tablet (600 mg total) by mouth 2 (two) times daily.   Marland Kitchen loratadine (CLARITIN) 10 MG tablet Take 1 tablet (10 mg total) by mouth daily.   . Multiple Vitamins-Minerals (CENTRUM SILVER PO) Take 1 capsule by mouth daily.   . pantoprazole (PROTONIX) 40 MG tablet Take 1 tablet (40 mg total) by mouth daily.   . polyethylene glycol (MIRALAX) packet Take 17 g by mouth daily.    No facility-administered encounter medications on file as of 08/14/2019.     Surgical History: Past Surgical History:  Procedure Laterality Date  . ABDOMINAL HYSTERECTOMY    . COLONOSCOPY  06/08/2013   Dr Candace Cruise- small mouth diverticula  . HIP SURGERY Left   . KNEE SURGERY Bilateral   . NASAL RECONSTRUCTION     x 2    Medical History: Past Medical History:  Diagnosis Date  . Anxiety   . Asthma   . Cancer ([place]) cervical-1973  . COPD (chronic obstructive pulmonary disease) ([place])   . Depression   . Diverticula, colon   . Diverticulosis   . GERD (gastroesophageal reflux disease)   . Sleep apnea     Family History: Family History  Problem Relation Age of Onset  . Cirrhosis Father   . Coronary artery disease Mother   . Breast cancer Neg Hx  Social History: Social History   Socioeconomic History  . Marital status: Divorced    Spouse name: Not on file  . Number of children: Not on file  . Years of education: Not on file  . Highest education level: High school graduate  Occupational History  . Occupation: retired  Scientific laboratory technician  . Financial resource strain: Not hard at all  . Food insecurity    Worry: Never true    Inability: Never true  . Transportation needs    Medical: No    Non-medical: No  Tobacco Use  . Smoking status: Former Research scientist (life sciences)  . Smokeless tobacco: Never Used   Substance and Sexual Activity  . Alcohol use: No    Alcohol/week: 0.0 standard drinks  . Drug use: No  . Sexual activity: Never    Birth control/protection: Post-menopausal  Lifestyle  . Physical activity    Days per week: 0 days    Minutes per session: 0 min  . Stress: Patient refused  Relationships  . Social connections    Talks on phone: More than three times a week    Gets together: More than three times a week    Attends religious service: Never    Active member of club or organization: No    Attends meetings of clubs or organizations: Never    Relationship status: Divorced  . Intimate partner violence    Fear of current or ex partner: No    Emotionally abused: No    Physically abused: No    Forced sexual activity: No  Other Topics Concern  . Not on file  Social History Narrative  . Not on file    Vital Signs: Blood pressure 128/68, pulse 70, resp. rate 16, height 5\' 3"  (1.6 m), weight 192 lb (87.1 kg), SpO2 98 %.  Examination: General Appearance: The patient is well-developed, well-nourished, and in no distress. Skin: Gross inspection of skin unremarkable. Head: normocephalic, no gross deformities. Eyes: no gross deformities noted. ENT: ears appear grossly normal no exudates. Neck: Supple. No thyromegaly. No LAD. Respiratory: clear bilateraly. Cardiovascular: Normal S1 and S2 without murmur or rub. Extremities: No cyanosis. pulses are equal. Neurologic: Alert and oriented. No involuntary movements.  LABS: Recent Results (from the past 2160 hour(s))  Lipid Panel With LDL/HDL Ratio     Status: None   Collection Time: 07/31/19 11:25 AM  Result Value Ref Range   Cholesterol, Total 147 100 - 199 mg/dL   Triglycerides 84 0 - 149 mg/dL   HDL 45 >39 mg/dL   VLDL Cholesterol Cal 16 5 - 40 mg/dL   LDL Chol Calc (NIH) 86 0 - 99 mg/dL   LDL/HDL Ratio 1.9 0.0 - 3.2 ratio    Comment:                                     LDL/HDL Ratio                                              Men  Women                               1/2 Avg.Risk  1.0    1.5  Avg.Risk  3.6    3.2                                2X Avg.Risk  6.2    5.0                                3X Avg.Risk  8.0    6.1   Comprehensive Metabolic Panel (CMET)     Status: Abnormal   Collection Time: 07/31/19 11:25 AM  Result Value Ref Range   Glucose 86 65 - 99 mg/dL   BUN 13 8 - 27 mg/dL   Creatinine, Ser 0.74 0.57 - 1.00 mg/dL   GFR calc non Af Amer 77 >59 mL/min/1.73   GFR calc Af Amer 88 >59 mL/min/1.73   BUN/Creatinine Ratio 18 12 - 28   Sodium 138 134 - 144 mmol/L   Potassium 4.6 3.5 - 5.2 mmol/L   Chloride 102 96 - 106 mmol/L   CO2 24 20 - 29 mmol/L   Calcium 9.3 8.7 - 10.3 mg/dL   Total Protein 7.1 6.0 - 8.5 g/dL   Albumin 4.8 (H) 3.7 - 4.7 g/dL   Globulin, Total 2.3 1.5 - 4.5 g/dL   Albumin/Globulin Ratio 2.1 1.2 - 2.2   Bilirubin Total 0.3 0.0 - 1.2 mg/dL   Alkaline Phosphatase 112 39 - 117 IU/L   AST 21 0 - 40 IU/L   ALT 11 0 - 32 IU/L    Radiology: Mm 3d Screen Breast Bilateral  Result Date: 05/10/2019 CLINICAL DATA:  Screening. EXAM: DIGITAL SCREENING BILATERAL MAMMOGRAM WITH TOMO AND CAD COMPARISON:  Previous exam(s). ACR Breast Density Category b: There are scattered areas of fibroglandular density. FINDINGS: There are no findings suspicious for malignancy. Images were processed with CAD. IMPRESSION: No mammographic evidence of malignancy. A result letter of this screening mammogram will be mailed directly to the patient. RECOMMENDATION: Screening mammogram in one year. (Code:SM-B-01Y) BI-RADS CATEGORY  1: Negative. Electronically Signed   By: Lovey Newcomer M.D.   On: 05/10/2019 12:15    No results found.  No results found.    Assessment and Plan: Patient Active Problem List   Diagnosis Date Noted  . Morbid obesity ([place]) 07/31/2019  . Mild episode of recurrent major depressive disorder ([place]) 03/23/2018  . Anxiety 03/23/2018  . Taking  medication for chronic disease 03/23/2018  . Other constipation 12/13/2017  . Depression with anxiety 08/19/2017  . Hyperlipidemia 08/19/2017  . Gastroesophageal reflux disease without esophagitis 08/19/2017  . Other chest pain 04/16/2015  . COPD exacerbation ([place]) 04/16/2015  . Sleep apnea 04/16/2015  . 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 Asthmatic bronchitis 04/16/2015  . Abdominal pain, LLQ 03/29/2014  . Rectal bleeding 03/29/2014  . 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 Osteoarthrosis, unspecified whether generalized or localized, pelvic region and thigh 03/26/2014    1. OSA on CPAP Continue to use cpap as discussed.   2. Obstructive chronic bronchitis without exacerbation ([place]) Stable, continue to use Breo as discussed, and follow up for pFT>  - fluticasone furoate-vilanterol (BREO ELLIPTA) 100-25 MCG/INH AEPB; Inhale 1 puff by mouth once daily  Dispense: 60 each; Refill: 3 - Pulmonary Function Test; Future  3. Morbid obesity (HCC) Obesity Counseling: Risk Assessment: An assessment of behavioral risk factors was made today and includes lack of exercise sedentary lifestyle, lack of portion control and poor dietary habits.  Risk Modification Advice: She was counseled on portion control guidelines. Restricting daily caloric intake to. . The detrimental long term effects of obesity on her health and ongoing poor compliance was also discussed with the patient.  4. SOB (shortness of breath) - Spirometry with Graph - ECHOCARDIOGRAM COMPLETE; Future  General Counseling: I have discussed the findings of the evaluation and examination with [place].  I have also discussed any further diagnostic evaluation thatmay be needed or ordered today. Jem verbalizes understanding of the findings of todays visit. We also reviewed her medications today and discussed drug interactions and side effects including but not limited excessive drowsiness and altered mental states. We also discussed that  there is always a risk not just to her but also people around her. she has been encouraged to call the office with any questions or concerns that should arise related to todays visit.    Time spent: 15 This patient was seen by Orson Gear AGNP-C in Collaboration with Dr. Devona Konig as a part of collaborative care agreement.   I have personally obtained a history, examined the patient, evaluated laboratory and imaging results, formulated the assessment and plan and placed orders.    [name], MD [facility] Pulmonary and Critical Care Sleep medicine

## 2019-08-18 ENCOUNTER — Ambulatory Visit: Payer: Medicare Other

## 2019-08-18 ENCOUNTER — Other Ambulatory Visit: Payer: Self-pay

## 2019-08-18 DIAGNOSIS — R0602 Shortness of breath: Secondary | ICD-10-CM

## 2019-08-23 ENCOUNTER — Ambulatory Visit: Payer: Medicare Other | Admitting: Internal Medicine

## 2019-08-29 ENCOUNTER — Telehealth: Payer: Self-pay

## 2019-08-29 NOTE — Telephone Encounter (Signed)
Pt called questioning refills on fluoxetine 20mg  and pantoprazole 40mg - called [facility] in [place] and spoke to tech- she verified that fluoxetine 20, and 10mg  were received as well as pantoprazole 40mg . I called pt 3 times with no answer and no machine to leave message on

## 2019-08-30 ENCOUNTER — Ambulatory Visit: Payer: Medicare Other | Admitting: Internal Medicine

## 2019-08-30 ENCOUNTER — Other Ambulatory Visit: Payer: Self-pay

## 2019-09-13 ENCOUNTER — Other Ambulatory Visit: Payer: Self-pay

## 2019-09-13 ENCOUNTER — Ambulatory Visit: Payer: Medicare Other | Admitting: Internal Medicine

## 2019-09-13 DIAGNOSIS — R0602 Shortness of breath: Secondary | ICD-10-CM | POA: Diagnosis not present

## 2019-09-13 LAB — PULMONARY FUNCTION TEST

## 2019-09-16 NOTE — Procedures (Signed)
[facility] [place], [address]  DATE OF SERVICE: September 13, 2019  Complete Pulmonary Function Testing Interpretation:  FINDINGS:  Forced vital capacity is mildly decreased.  FEV1 is 1.47 L which is mildly decreased.  FEV1 FVC ratio is normal.  Total lung capacity is mildly decreased residual volume is normal residual volume total lung capacity ratio is increased.  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

## 2019-09-28 ENCOUNTER — Other Ambulatory Visit: Payer: Self-pay

## 2019-09-28 DIAGNOSIS — Z20822 Contact with and (suspected) exposure to covid-19: Secondary | ICD-10-CM

## 2019-10-01 LAB — NOVEL CORONAVIRUS, NAA: SARS-CoV-2, NAA: NOT DETECTED

## 2019-10-02 ENCOUNTER — Ambulatory Visit: Payer: Medicare Other

## 2019-10-05 ENCOUNTER — Telehealth: Payer: Self-pay

## 2019-10-05 NOTE — Telephone Encounter (Signed)
Patient rescheduled appointment on 10/09/19 to 11/06/2019, patient was exposed to covid. [facility]

## 2019-10-07 ENCOUNTER — Other Ambulatory Visit: Payer: Self-pay | Admitting: Family Medicine

## 2019-10-07 DIAGNOSIS — J309 Allergic rhinitis, unspecified: Secondary | ICD-10-CM

## 2019-10-09 ENCOUNTER — Ambulatory Visit: Payer: Medicare Other | Admitting: Internal Medicine

## 2019-10-16 ENCOUNTER — Ambulatory Visit (INDEPENDENT_AMBULATORY_CARE_PROVIDER_SITE_OTHER): Payer: Medicare Other

## 2019-10-16 DIAGNOSIS — Z0000 Encounter for general adult medical examination without abnormal findings: Secondary | ICD-10-CM

## 2019-10-16 DIAGNOSIS — G4733 Obstructive sleep apnea (adult) (pediatric): Secondary | ICD-10-CM | POA: Diagnosis not present

## 2019-10-16 NOTE — Progress Notes (Signed)
Subjective:   Regina Macdonald is a 78 y.o. female who presents for Medicare Annual (Subsequent) preventive examination.  Virtual Visit via Telephone Note  I connected with Regina Macdonald on 10/16/19 at 11:20 AM EST by telephone and verified that I am speaking with the correct person using two identifiers.  Medicare Annual Wellness visit completed telephonically due to Covid-19 pandemic.   Location: Patient: home Provider: office   I discussed the limitations, risks, security and privacy concerns of performing an evaluation and management service by telephone and the availability of in person appointments. The patient expressed understanding and agreed to proceed.  Some vital signs may be absent or patient reported.   Clemetine Marker, LPN    Review of Systems:   Cardiac Risk Factors include: advanced age (>73men, >57 women);dyslipidemia     Objective:     Vitals: There were no vitals taken for this visit.  There is no height or weight on file to calculate BMI.  Advanced Directives 10/16/2019 09/28/2018 08/16/2017 07/20/2017 11/20/2015 04/16/2015  Does Patient Have a Medical Advance Directive? No No Yes Yes Yes No  Type of Advance Directive - Programmer, multimedia of [employer] Power of [place];Living will -  Copy of [place] in Chart? - - No - copy requested Yes No - copy requested -  Would patient like information on creating a medical advance directive? No - Patient declined Yes (MAU/Ambulatory/Procedural Areas - Information given) - - - No - patient declined information    Tobacco Social History   Tobacco Use  Smoking Status Former Smoker  Smokeless Tobacco Never Used     Counseling given: Not Answered   Clinical Intake:  Pre-visit preparation completed: Yes  Pain : No/denies pain     Nutritional Risks: None Diabetes: No  How often do you need to have someone help you when you read instructions,  pamphlets, or other written materials from your doctor or pharmacy?: 1 - Never  Interpreter Needed?: No  Information entered by :: Clemetine Marker LPN  Past Medical History:  Diagnosis Date  . Anxiety   . Asthma   . 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 COPD (chronic obstructive pulmonary disease) ([place])   . Depression   . Diverticula, colon   . Diverticulosis   . GERD (gastroesophageal reflux disease)   . Sleep apnea    Past Surgical History:  Procedure Laterality Date  . ABDOMINAL HYSTERECTOMY    . COLONOSCOPY  06/08/2013   Dr Candace Cruise- small mouth diverticula  . HIP SURGERY Left   . KNEE SURGERY Bilateral   . NASAL RECONSTRUCTION     x 2   Family History  Problem Relation Age of Onset  . Cirrhosis Father   . Coronary artery disease Mother   . Breast cancer Neg Hx    Social History   Socioeconomic History  . Marital status: Divorced    Spouse name: Not on file  . Number of children: 3  . Years of education: Not on file  . Highest education level: High school graduate  Occupational History  . Occupation: retired  Scientific laboratory technician  . Financial resource strain: Not hard at all  . Food insecurity    Worry: Never true    Inability: Never true  . Transportation needs    Medical: No    Non-medical: No  Tobacco Use  . Smoking status: Former Research scientist (life sciences)  . Smokeless tobacco: Never Used  Substance and Sexual Activity  .  Alcohol use: No    Alcohol/week: 0.0 standard drinks  . Drug use: No  . Sexual activity: Not Currently    Birth control/protection: Post-menopausal  Lifestyle  . Physical activity    Days per week: 0 days    Minutes per session: 0 min  . Stress: Only a little  Relationships  . Social connections    Talks on phone: More than three times a week    Gets together: More than three times a week    Attends religious service: Never    Active member of club or organization: No    Attends meetings of clubs or organizations: Never    Relationship status: Divorced  Other  Topics Concern  . Not on file  Social History Narrative  . Not on file    Outpatient Encounter Medications as of 10/16/2019  Medication Sig  . acetaminophen (TYLENOL) 500 MG tablet Take 500 mg by mouth every 6 (six) hours as needed.  Marland Kitchen aspirin 81 MG chewable tablet Chew by mouth.  Marland Kitchen CALCIUM & MAGNESIUM CARBONATES PO Take 1 tablet by mouth daily.  . Cyanocobalamin (VITAMIN B-12) 1000 MCG SUBL 1 tablet daily.  Marland Kitchen EQUATE STOOL SOFTENER 100 MG capsule Take 1 capsule by mouth twice daily  . FLUoxetine (PROZAC) 10 MG capsule Take 1 capsule (10 mg total) by mouth daily.  Marland Kitchen FLUoxetine (PROZAC) 20 MG capsule Take 1 capsule (20 mg total) by mouth daily.  . fluticasone (FLONASE) 50 MCG/ACT nasal spray Use 2 spray(s) in each nostril once daily  . fluticasone furoate-vilanterol (BREO ELLIPTA) 100-25 MCG/INH AEPB Inhale 1 puff by mouth once daily  . gemfibrozil (LOPID) 600 MG tablet Take 1 tablet (600 mg total) by mouth 2 (two) times daily.  Marland Kitchen loratadine (CLARITIN) 10 MG tablet Take 1 tablet (10 mg total) by mouth daily.  . Multiple Vitamins-Minerals (CENTRUM SILVER PO) Take 1 capsule by mouth daily.  . pantoprazole (PROTONIX) 40 MG tablet Take 1 tablet (40 mg total) by mouth daily.  . polyethylene glycol (MIRALAX) packet Take 17 g by mouth daily.   No facility-administered encounter medications on file as of 10/16/2019.     Activities of Daily Living In your present state of health, do you have any difficulty performing the following activities: 10/16/2019  Hearing? N  Comment declines hearing aids  Vision? N  Difficulty concentrating or making decisions? N  Walking or climbing stairs? N  Dressing or bathing? N  Doing errands, shopping? N  Preparing Food and eating ? N  Using the Toilet? N  In the past six months, have you accidently leaked urine? N  Do you have problems with loss of bowel control? N  Managing your Medications? N  Managing your Finances? N  Housekeeping or managing your  Housekeeping? N  Some recent data might be hidden    Patient Care Team: Juline Patch, MD as PCP - General (Family Medicine) [name], MD as Consulting Physician (Internal Medicine)    Assessment:   This is a routine wellness examination for [place].  Exercise Activities and Dietary recommendations Current Exercise Habits: The patient does not participate in regular exercise at present, Exercise limited by: None identified  Goals    . DIET - INCREASE WATER INTAKE     Recommend drinking 6-8 glasses of water per day    . Prevent Falls     Fall prevention discussed       Fall Risk Fall Risk  10/16/2019 01/16/2019 09/28/2018 08/16/2018 12/30/2017  Falls in the past year? 0 0 0 No No  Number falls in past yr: 0 0 0 - -  Injury with Fall? 0 0 - - -  Follow up Falls prevention discussed Falls evaluation completed - - -   FALL RISK PREVENTION PERTAINING TO THE HOME:  Any stairs in or around the home? No  If so, do they handrails? No   Home free of loose throw rugs in walkways, pet beds, electrical cords, etc? Yes  Adequate lighting in your home to reduce risk of falls? Yes   ASSISTIVE DEVICES UTILIZED TO PREVENT FALLS:  Life alert? No  Use of a cane, walker or w/c? No  Grab bars in the bathroom? No  Shower chair or bench in shower? No  Elevated toilet seat or a handicapped toilet? No   DME ORDERS:  DME order needed?  No   TIMED UP AND GO:  Was the test performed? No . Telephonic visit.   Education: Fall risk prevention has been discussed.  Intervention(s) required? No   Depression Screen PHQ 2/9 Scores 10/16/2019 07/31/2019 01/16/2019 09/29/2018  PHQ - 2 Score 0 0 0 2  PHQ- 9 Score - 0 - 4     Cognitive Function     6CIT Screen 10/16/2019 09/28/2018 08/16/2017  What Year? 0 points 0 points 0 points  What month? 0 points 0 points 0 points  What time? 0 points 0 points 0 points  Count back from 20 0 points 0 points 0 points  Months in reverse 0 points 0  points 0 points  Repeat phrase 0 points 2 points 2 points  Total Score 0 2 2    Immunization History  Administered Date(s) Administered  . Fluad Quad(high Dose 65+) 07/31/2019  . Influenza, High Dose Seasonal PF 08/16/2017, 08/07/2018  . Influenza,inj,Quad PF,6+ Mos 09/09/2015  . Influenza-Unspecified 07/24/2014, 08/07/2018  . Pneumococcal Conjugate-13 09/09/2015  . Pneumococcal Polysaccharide-23 02/09/2013  . Tdap 08/10/2011  . Zoster 02/27/2015    Qualifies for Shingles Vaccine? Yes  Zostavax completed 2016. Due for Shingrix. Education has been provided regarding the importance of this vaccine. Pt has been advised to call insurance company to determine out of pocket expense. Advised may also receive vaccine at local pharmacy or Health Dept. Verbalized acceptance and understanding.  Tdap: Up to date  Flu Vaccine: Up to date  Pneumococcal Vaccine: Up to date   Screening Tests Health Maintenance  Topic Date Due  . TETANUS/TDAP  08/09/2021  . INFLUENZA VACCINE  Completed  . DEXA SCAN  Completed  . PNA vac Low Risk Adult  Completed    Cancer Screenings:  Colorectal Screening: Completed 06/08/13. No longer required.   Mammogram: Completed 05/10/19. Repeat every year.  Bone Density: Completed 03/19/15. Results reflectOSTEOPENIA. Repeat every 2 years. Pt declines repeat screening at this time. .   Lung Cancer Screening: (Low Dose CT Chest recommended if Age 75-80 years, 30 pack-year currently smoking OR have quit w/in 15years.) does not qualify.    Additional Screening:  Hepatitis C Screening: no longer required   Vision Screening: Recommended annual ophthalmology exams for early detection of glaucoma and other disorders of the eye. Is the patient up to date with their annual eye exam?  Yes  Who is the provider or what is the name of the office in which the pt attends annual eye exams? [facility] [place]  Dental Screening: Recommended annual dental exams for proper oral  hygiene  Community Resource Referral:  CRR required this visit?  No '     Plan:    I have personally reviewed and addressed the Medicare Annual Wellness questionnaire and have noted the following in the patient's chart:  A. Medical and social history B. Use of alcohol, tobacco or illicit drugs  C. Current medications and supplements D. Functional ability and status E.  Nutritional status F.  Physical activity G. Advance directives H. List of other physicians I.  Hospitalizations, surgeries, and ER visits in previous 12 months J.  [place] such as hearing and vision if needed, cognitive and depression L. Referrals and appointments   In addition, I have reviewed and discussed with patient certain preventive protocols, quality metrics, and best practice recommendations. A written personalized care plan for preventive services as well as general preventive health recommendations were provided to patient.   Signed,  Clemetine Marker, LPN Nurse Health Advisor   Nurse Notes:

## 2019-10-16 NOTE — Patient Instructions (Signed)
Regina Macdonald , Thank you for taking time to come for your Medicare Wellness Visit. I appreciate your ongoing commitment to your health goals. Please review the following plan we discussed and let me know if I can assist you in the future.   Screening recommendations/referrals: Colonoscopy: done 06/08/13 Mammogram: done 05/10/19 Bone Density: done 03/19/15 Recommended yearly ophthalmology/optometry visit for glaucoma screening and checkup Recommended yearly dental visit for hygiene and checkup  Vaccinations: Influenza vaccine: done 07/31/19 Pneumococcal vaccine: done 09/09/15 Tdap vaccine: done 08/10/11 Shingles vaccine: Shingrix discussed. Please contact your pharmacy for coverage information.      Advanced directives: Advance directive discussed with you today. Even though you declined this today please call our office should you change your mind and we can give you the proper paperwork for you to fill out.  Conditions/risks identified: Recommend increasing physical activity to at least 3 days per week.   Next appointment: Please follow up in one year for your Medicare Annual Wellness visit.     Preventive Care 32 Years and Older, Female Preventive care refers to lifestyle choices and visits with your health care provider that can promote health and wellness. What does preventive care include?  A yearly physical exam. This is also called an annual well check.  Dental exams once or twice a year.  Routine eye exams. Ask your health care provider how often you should have your eyes checked.  Personal lifestyle choices, including:  Daily care of your teeth and gums.  Regular physical activity.  Eating a healthy diet.  Avoiding tobacco and drug use.  Limiting alcohol use.  Practicing safe sex.  Taking low-dose aspirin every day.  Taking vitamin and mineral supplements as recommended by your health care provider. What happens during an annual well check? The services and  screenings done by your health care provider during your annual well check will depend on your age, overall health, lifestyle risk factors, and family history of disease. Counseling  Your health care provider may ask you questions about your:  Alcohol use.  Tobacco use.  Drug use.  Emotional well-being.  Home and relationship well-being.  Sexual activity.  Eating habits.  History of falls.  Memory and ability to understand (cognition).  Work and work Statistician.  Reproductive health. Screening  You may have the following tests or measurements:  Height, weight, and BMI.  Blood pressure.  Lipid and cholesterol levels. These may be checked every 5 years, or more frequently if you are over 66 years old.  Skin check.  Lung cancer screening. You may have this screening every year starting at age 89 if you have a 30-pack-year history of smoking and currently smoke or have quit within the past 15 years.  Fecal occult blood test (FOBT) of the stool. You may have this test every year starting at age 45.  Flexible sigmoidoscopy or colonoscopy. You may have a sigmoidoscopy every 5 years or a colonoscopy every 10 years starting at age 57.  Hepatitis C blood test.  Hepatitis B blood test.  Sexually transmitted disease (STD) testing.  Diabetes screening. This is done by checking your blood sugar (glucose) after you have not eaten for a while (fasting). You may have this done every 1-3 years.  Bone density scan. This is done to screen for osteoporosis. You may have this done starting at age 19.  Mammogram. This may be done every 1-2 years. Talk to your health care provider about how often you should have regular mammograms. Talk with  your health care provider about your test results, treatment options, and if necessary, the need for more tests. Vaccines  Your health care provider may recommend certain vaccines, such as:  Influenza vaccine. This is recommended every year.   Tetanus, diphtheria, and acellular pertussis (Tdap, Td) vaccine. You may need a Td booster every 10 years.  Zoster vaccine. You may need this after age 23.  Pneumococcal 13-valent conjugate (PCV13) vaccine. One dose is recommended after age 45.  Pneumococcal polysaccharide (PPSV23) vaccine. One dose is recommended after age 59. Talk to your health care provider about which screenings and vaccines you need and how often you need them. This information is not intended to replace advice given to you by your health care provider. Make sure you discuss any questions you have with your health care provider. Document Released: 11/29/2015 Document Revised: 07/22/2016 Document Reviewed: 09/03/2015 Elsevier Interactive Patient Education  2017 [place] Prevention in the Home Falls can cause injuries. They can happen to people of all ages. There are many things you can do to make your home safe and to help prevent falls. What can I do on the outside of my home?  Regularly fix the edges of walkways and driveways and fix any cracks.  Remove anything that might make you trip as you walk through a door, such as a raised step or threshold.  Trim any bushes or trees on the path to your home.  Use bright outdoor lighting.  Clear any walking paths of anything that might make someone trip, such as rocks or tools.  Regularly check to see if handrails are loose or broken. Make sure that both sides of any steps have handrails.  Any raised decks and porches should have guardrails on the edges.  Have any leaves, snow, or ice cleared regularly.  Use sand or salt on walking paths during winter.  Clean up any spills in your garage right away. This includes oil or grease spills. What can I do in the bathroom?  Use night lights.  Install grab bars by the toilet and in the tub and shower. Do not use towel bars as grab bars.  Use non-skid mats or decals in the tub or shower.  If you need to sit  down in the shower, use a plastic, non-slip stool.  Keep the floor dry. Clean up any water that spills on the floor as soon as it happens.  Remove soap buildup in the tub or shower regularly.  Attach bath mats securely with double-sided non-slip rug tape.  Do not have throw rugs and other things on the floor that can make you trip. What can I do in the bedroom?  Use night lights.  Make sure that you have a light by your bed that is easy to reach.  Do not use any sheets or blankets that are too big for your bed. They should not hang down onto the floor.  Have a firm chair that has side arms. You can use this for support while you get dressed.  Do not have throw rugs and other things on the floor that can make you trip. What can I do in the kitchen?  Clean up any spills right away.  Avoid walking on wet floors.  Keep items that you use a lot in easy-to-reach places.  If you need to reach something above you, use a strong step stool that has a grab bar.  Keep electrical cords out of the way.  Do not  use floor polish or wax that makes floors slippery. If you must use wax, use non-skid floor wax.  Do not have throw rugs and other things on the floor that can make you trip. What can I do with my stairs?  Do not leave any items on the stairs.  Make sure that there are handrails on both sides of the stairs and use them. Fix handrails that are broken or loose. Make sure that handrails are as long as the stairways.  Check any carpeting to make sure that it is firmly attached to the stairs. Fix any carpet that is loose or worn.  Avoid having throw rugs at the top or bottom of the stairs. If you do have throw rugs, attach them to the floor with carpet tape.  Make sure that you have a light switch at the top of the stairs and the bottom of the stairs. If you do not have them, ask someone to add them for you. What else can I do to help prevent falls?  Wear shoes that:  Do not have  high heels.  Have rubber bottoms.  Are comfortable and fit you well.  Are closed at the toe. Do not wear sandals.  If you use a stepladder:  Make sure that it is fully opened. Do not climb a closed stepladder.  Make sure that both sides of the stepladder are locked into place.  Ask someone to hold it for you, if possible.  Clearly mark and make sure that you can see:  Any grab bars or handrails.  First and last steps.  Where the edge of each step is.  Use tools that help you move around (mobility aids) if they are needed. These include:  Canes.  Walkers.  Scooters.  Crutches.  Turn on the lights when you go into a dark area. Replace any light bulbs as soon as they burn out.  Set up your furniture so you have a clear path. Avoid moving your furniture around.  If any of your floors are uneven, fix them.  If there are any pets around you, be aware of where they are.  Review your medicines with your doctor. Some medicines can make you feel dizzy. This can increase your chance of falling. Ask your doctor what other things that you can do to help prevent falls. This information is not intended to replace advice given to you by your health care provider. Make sure you discuss any questions you have with your health care provider. Document Released: 08/29/2009 Document Revised: 04/09/2016 Document Reviewed: 12/07/2014 Elsevier Interactive Patient Education  2017 [employer].

## 2019-10-31 ENCOUNTER — Encounter: Payer: Self-pay | Admitting: Internal Medicine

## 2019-10-31 ENCOUNTER — Other Ambulatory Visit: Payer: Self-pay

## 2019-10-31 ENCOUNTER — Ambulatory Visit: Payer: Medicare Other | Admitting: Internal Medicine

## 2019-10-31 VITALS — BP 145/49 | HR 78 | Resp 16 | Ht 63.0 in | Wt 187.0 lb

## 2019-10-31 DIAGNOSIS — J449 Chronic obstructive pulmonary disease, unspecified: Secondary | ICD-10-CM

## 2019-10-31 DIAGNOSIS — G4733 Obstructive sleep apnea (adult) (pediatric): Secondary | ICD-10-CM

## 2019-10-31 DIAGNOSIS — Z9989 Dependence on other enabling machines and devices: Secondary | ICD-10-CM | POA: Diagnosis not present

## 2019-10-31 NOTE — Progress Notes (Signed)
[facility] [place], [place] [address]  Pulmonary Sleep Medicine   Office Visit Note  Patient Name: Regina Macdonald DOB: [birth year] MRN [id number]  Date of Service: 10/31/2019  Complaints/HPI: Pt is here for follow up on [place] PFT.  She has been wearing cpap for osa. She reports she is wearing her machine, however she is waking up a few times each night.  She Denies Chest pain, Shortness of breath, palpitations, headache, or blurred vision.    ROS  General: (-) fever, (-) chills, (-) night sweats, (-) weakness Skin: (-) rashes, (-) itching,. Eyes: (-) visual changes, (-) redness, (-) itching. Nose and Sinuses: (-) nasal stuffiness or itchiness, (-) postnasal drip, (-) nosebleeds, (-) sinus trouble. Mouth and Throat: (-) sore throat, (-) hoarseness. Neck: (-) swollen glands, (-) enlarged thyroid, (-) neck pain. Respiratory: - cough, (-) bloody sputum, - shortness of breath, - wheezing. Cardiovascular: - ankle swelling, (-) chest pain. Lymphatic: (-) lymph node enlargement. Neurologic: (-) numbness, (-) tingling. Psychiatric: (-) anxiety, (-) depression   Current Medication: Outpatient Encounter Medications as of 10/31/2019  Medication Sig Note  . acetaminophen (TYLENOL) 500 MG tablet Take 500 mg by mouth every 6 (six) hours as needed.   Marland Kitchen aspirin 81 MG chewable tablet Chew by mouth.   Marland Kitchen CALCIUM & MAGNESIUM CARBONATES PO Take 1 tablet by mouth daily. 04/09/2015: Received from: [place]:   . Cyanocobalamin (VITAMIN B-12) 1000 MCG SUBL 1 tablet daily. 04/09/2015: Received from: [place]:   . EQUATE STOOL SOFTENER 100 MG capsule Take 1 capsule by mouth twice daily   . FLUoxetine (PROZAC) 10 MG capsule Take 1 capsule (10 mg total) by mouth daily.   Marland Kitchen FLUoxetine (PROZAC) 20 MG capsule Take 1 capsule (20 mg total) by mouth daily.   . fluticasone (FLONASE) 50 MCG/ACT nasal spray Use 2  spray(s) in each nostril once daily   . fluticasone furoate-vilanterol (BREO ELLIPTA) 100-25 MCG/INH AEPB Inhale 1 puff by mouth once daily   . gemfibrozil (LOPID) 600 MG tablet Take 1 tablet (600 mg total) by mouth 2 (two) times daily.   Marland Kitchen loratadine (CLARITIN) 10 MG tablet Take 1 tablet (10 mg total) by mouth daily.   . Multiple Vitamins-Minerals (CENTRUM SILVER PO) Take 1 capsule by mouth daily.   . pantoprazole (PROTONIX) 40 MG tablet Take 1 tablet (40 mg total) by mouth daily.   . polyethylene glycol (MIRALAX) packet Take 17 g by mouth daily.    No facility-administered encounter medications on file as of 10/31/2019.    Surgical History: Past Surgical History:  Procedure Laterality Date  . ABDOMINAL HYSTERECTOMY    . COLONOSCOPY  06/08/2013   Dr Candace Cruise- small mouth diverticula  . HIP SURGERY Left   . KNEE SURGERY Bilateral   . NASAL RECONSTRUCTION     x 2    Medical History: Past Medical History:  Diagnosis Date  . Anxiety   . Asthma   . Cancer ([place]) cervical-1973  . COPD (chronic obstructive pulmonary disease) ([place])   . Depression   . Diverticula, colon   . Diverticulosis   . GERD (gastroesophageal reflux disease)   . Sleep apnea     Family History: Family History  Problem Relation Age of Onset  . Cirrhosis Father   . Coronary artery disease Mother   . Breast cancer Neg Hx     Social History: Social History   Socioeconomic History  . Marital  status: Divorced    Spouse name: Not on file  . Number of children: 3  . Years of education: Not on file  . Highest education level: High school graduate  Occupational History  . Occupation: retired  Tobacco Use  . Smoking status: Former Research scientist (life sciences)  . Smokeless tobacco: Never Used  Substance and Sexual Activity  . Alcohol use: No    Alcohol/week: 0.0 standard drinks  . Drug use: No  . Sexual activity: Not Currently    Birth control/protection: Post-menopausal  Other Topics Concern  . Not on file  Social History  Narrative  . Not on file   Social Determinants of Health   Financial Resource Strain:   . Difficulty of Paying Living Expenses: Not on file  Food Insecurity:   . Worried About Charity fundraiser in the Last Year: Not on file  . Ran Out of Food in the Last Year: Not on file  Transportation Needs:   . Lack of Transportation (Medical): Not on file  . Lack of Transportation (Non-Medical): Not on file  Physical Activity:   . Days of Exercise per Week: Not on file  . Minutes of Exercise per Session: Not on file  Stress: No Stress Concern Present  . Feeling of Stress : Only a little  Social Connections:   . Frequency of Communication with Friends and Family: Not on file  . Frequency of Social Gatherings with Friends and Family: Not on file  . Attends Religious Services: Not on file  . Active Member of Clubs or Organizations: Not on file  . Attends Archivist Meetings: Not on file  . Marital Status: Not on file  Intimate Partner Violence:   . Fear of Current or Ex-Partner: Not on file  . Emotionally Abused: Not on file  . Physically Abused: Not on file  . Sexually Abused: Not on file    Vital Signs: Blood pressure (!) 145/49, pulse 78, resp. rate 16, height 5\' 3"  (1.6 m), weight 187 lb (84.8 kg), SpO2 96 %.  Examination: General Appearance: The patient is well-developed, well-nourished, and in no distress. Skin: Gross inspection of skin unremarkable. Head: normocephalic, no gross deformities. Eyes: no gross deformities noted. ENT: ears appear grossly normal no exudates. Neck: Supple. No thyromegaly. No LAD. Respiratory: clear bilaterally. Cardiovascular: Normal S1 and S2 without murmur or rub. Extremities: No cyanosis. pulses are equal. Neurologic: Alert and oriented. No involuntary movements.  LABS: Recent Results (from the past 2160 hour(s))  Pulmonary Function Test     Status: None   Collection Time: 09/13/19 11:30 AM  Result Value Ref Range   FEV1     FVC      FEV1/FVC     TLC     DLCO    Novel Coronavirus, NAA ([facility])     Status: None   Collection Time: 09/28/19 12:00 AM   Specimen: Nasopharyngeal(NP) swabs in vial transport medium   NASOPHARYNGE  TESTING  Result Value Ref Range   SARS-CoV-2, NAA Not Detected Not Detected    Comment: This nucleic acid amplification test was developed and its performance characteristics determined by [employer]. Nucleic acid amplification tests include PCR and TMA. This test has not been FDA cleared or approved. This test has been authorized by FDA under an Emergency Use Authorization (EUA). This test is only authorized for the duration of time the declaration that circumstances exist justifying the authorization of the emergency use of in vitro diagnostic tests for detection of SARS-CoV-2 virus  and/or diagnosis of COVID-19 infection under section 564(b)(1) of the Act, 21 U.S.C. [id number]) (1), unless the authorization is terminated or revoked sooner. When diagnostic testing is negative, the possibility of a false negative result should be considered in the context of a patient's recent exposures and the presence of clinical signs and symptoms consistent with COVID-19. An individual without symptoms of COVID-19 and who is not shedding SARS-CoV-2 virus would  expect to have a negative (not detected) result in this assay.     Radiology: MM 3D SCREEN BREAST BILATERAL  Result Date: 05/10/2019 CLINICAL DATA:  Screening. EXAM: DIGITAL SCREENING BILATERAL MAMMOGRAM WITH TOMO AND CAD COMPARISON:  Previous exam(s). ACR Breast Density Category b: There are scattered areas of fibroglandular density. FINDINGS: There are no findings suspicious for malignancy. Images were processed with CAD. IMPRESSION: No mammographic evidence of malignancy. A result letter of this screening mammogram will be mailed directly to the patient. RECOMMENDATION: Screening mammogram in one year. (Code:SM-B-01Y) BI-RADS CATEGORY   1: Negative. Electronically Signed   By: Lovey Newcomer M.D.   On: 05/10/2019 12:15    No results found.  No results found.    Assessment and Plan: Patient Active Problem List   Diagnosis Date Noted  . Morbid obesity ([place]) 07/31/2019  . 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 Anxiety 03/23/2018  . Taking medication for chronic disease 03/23/2018  . Other constipation 12/13/2017  . Depression with anxiety 08/19/2017  . Hyperlipidemia 08/19/2017  . Gastroesophageal reflux disease without esophagitis 08/19/2017  . Other chest pain 04/16/2015  . COPD exacerbation ([place]) 04/16/2015  . Sleep apnea 04/16/2015  . 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 Asthmatic bronchitis 04/16/2015  . Abdominal pain, LLQ 03/29/2014  . Rectal bleeding 03/29/2014  . 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 Osteoarthrosis, unspecified whether generalized or localized, pelvic region and thigh 03/26/2014    1. OSA on CPAP Will get cpap titration, to establish pressure for new machine.  - Cpap titration; Future  2. Obstructive chronic bronchitis without exacerbation (HCC) Stable, continue present management.  Pt uses Breo with good results.   3. Morbid obesity ([place]) Obesity Counseling: Risk Assessment: An assessment of behavioral risk factors was made today and includes lack of exercise sedentary lifestyle, lack of portion control and poor dietary habits.  Risk Modification Advice: She was counseled on portion control guidelines. Restricting daily caloric intake to 1600. The detrimental long term effects of obesity on her health and ongoing poor compliance was also discussed with the patient.    General Counseling: I have discussed the findings of the evaluation and examination with [place].  I have also discussed any further diagnostic evaluation thatmay be needed or ordered today. [place] verbalizes understanding of the findings of todays visit. We also reviewed her  medications today and discussed drug interactions and side effects including but not limited excessive drowsiness and altered mental states. We also discussed that there is always a risk not just to her but also people around her. she has been encouraged to call the office with any questions or concerns that should arise related to todays visit.  No orders of the defined types were placed in this encounter.    Time spent: 25 This patient was seen by Orson Gear AGNP-C in Collaboration with Dr. Devona Konig as a part of collaborative care agreement.   I have personally obtained a history, examined the patient, evaluated laboratory and imaging results, formulated the assessment and plan and placed orders.  [name], MD [facility] Pulmonary and Critical Care Sleep medicine

## 2019-11-03 ENCOUNTER — Telehealth: Payer: Self-pay

## 2019-11-03 NOTE — Telephone Encounter (Signed)
Tried calling pt to schd cpap titration sleep study for 11/07/19, pt did not have voice mail set up, I have that day open if she can come for sleep study Tuesday night. Beth

## 2019-11-06 ENCOUNTER — Ambulatory Visit: Payer: Medicare Other | Admitting: Internal Medicine

## 2019-11-15 ENCOUNTER — Other Ambulatory Visit: Payer: Self-pay | Admitting: Family Medicine

## 2019-11-15 DIAGNOSIS — J309 Allergic rhinitis, unspecified: Secondary | ICD-10-CM

## 2019-11-20 ENCOUNTER — Telehealth: Payer: Self-pay

## 2019-11-20 NOTE — Telephone Encounter (Signed)
Patient was exposed to covid rescheduled appointment on 11/22/2019 to 12/13/2019. klh

## 2019-11-22 ENCOUNTER — Ambulatory Visit: Payer: Medicare Other | Attending: Internal Medicine

## 2019-11-22 ENCOUNTER — Encounter: Payer: Medicare Other | Admitting: Internal Medicine

## 2019-11-22 DIAGNOSIS — Z20822 Contact with and (suspected) exposure to covid-19: Secondary | ICD-10-CM | POA: Diagnosis not present

## 2019-11-24 LAB — NOVEL CORONAVIRUS, NAA: SARS-CoV-2, NAA: NOT DETECTED

## 2019-12-11 ENCOUNTER — Telehealth: Payer: Self-pay

## 2019-12-11 NOTE — Telephone Encounter (Signed)
Confirmed patient Sleep Study for 12/13/2019

## 2019-12-13 ENCOUNTER — Ambulatory Visit (INDEPENDENT_AMBULATORY_CARE_PROVIDER_SITE_OTHER): Payer: Medicare Other | Admitting: Internal Medicine

## 2019-12-13 DIAGNOSIS — Z9989 Dependence on other enabling machines and devices: Secondary | ICD-10-CM

## 2019-12-13 DIAGNOSIS — G4733 Obstructive sleep apnea (adult) (pediatric): Secondary | ICD-10-CM | POA: Diagnosis not present

## 2019-12-16 ENCOUNTER — Other Ambulatory Visit: Payer: Self-pay | Admitting: Adult Health

## 2019-12-16 DIAGNOSIS — J449 Chronic obstructive pulmonary disease, unspecified: Secondary | ICD-10-CM

## 2019-12-20 ENCOUNTER — Encounter: Payer: Self-pay | Admitting: Family Medicine

## 2019-12-20 ENCOUNTER — Ambulatory Visit (INDEPENDENT_AMBULATORY_CARE_PROVIDER_SITE_OTHER): Payer: Medicare Other | Admitting: Family Medicine

## 2019-12-20 ENCOUNTER — Other Ambulatory Visit: Payer: Self-pay

## 2019-12-20 VITALS — BP 138/70 | HR 60 | Ht 63.0 in | Wt 182.0 lb

## 2019-12-20 DIAGNOSIS — R69 Illness, unspecified: Secondary | ICD-10-CM

## 2019-12-20 DIAGNOSIS — R42 Dizziness and giddiness: Secondary | ICD-10-CM

## 2019-12-20 DIAGNOSIS — E669 Obesity, unspecified: Secondary | ICD-10-CM

## 2019-12-20 DIAGNOSIS — Z0000 Encounter for general adult medical examination without abnormal findings: Secondary | ICD-10-CM

## 2019-12-20 DIAGNOSIS — E66811 Obesity, class 1: Secondary | ICD-10-CM

## 2019-12-20 DIAGNOSIS — E785 Hyperlipidemia, unspecified: Secondary | ICD-10-CM | POA: Diagnosis not present

## 2019-12-20 NOTE — Patient Instructions (Signed)

## 2019-12-20 NOTE — Progress Notes (Signed)
Date:  12/20/2019   Name:  Regina Macdonald   DOB:  07/03/1941   MRN:  [id number]   Chief Complaint: Annual Exam ([place])  Patient is a 79 year old female who presents for a comprehensive physical exam. The patient reports the following problems: none. Health maintenance has been reviewed up to date.   Lab Results  Component Value Date   CREATININE 0.74 07/31/2019   BUN 13 07/31/2019   NA 138 07/31/2019   K 4.6 07/31/2019   CL 102 07/31/2019   CO2 24 07/31/2019   Lab Results  Component Value Date   CHOL 147 07/31/2019   HDL 45 07/31/2019   LDLCALC 86 07/31/2019   TRIG 84 07/31/2019   CHOLHDL 3.0 08/19/2017   No results found for: TSH Lab Results  Component Value Date   HGBA1C 5.7 (H) 12/02/2015     Review of Systems  Constitutional: Negative.  Negative for chills, fatigue, fever and unexpected weight change.  HENT: Negative for congestion, ear discharge, ear pain, rhinorrhea, sinus pressure, sneezing and sore throat.   Eyes: Negative for photophobia, pain, discharge, redness and itching.  Respiratory: Negative for cough, shortness of breath, wheezing and stridor.   Gastrointestinal: Negative for abdominal pain, blood in stool, constipation, diarrhea, nausea and vomiting.  Endocrine: Negative for cold intolerance, heat intolerance, polydipsia, polyphagia and polyuria.  Genitourinary: Negative for dysuria, flank pain, frequency, hematuria, menstrual problem, pelvic pain, urgency, vaginal bleeding and vaginal discharge.  Musculoskeletal: Negative for arthralgias, back pain and myalgias.  Skin: Negative for rash.  Allergic/Immunologic: Negative for environmental allergies and food allergies.  Neurological: Negative for dizziness, weakness, light-headedness, numbness and headaches.  Hematological: Negative for adenopathy. Does not bruise/bleed easily.  Psychiatric/Behavioral: Negative for dysphoric mood. The patient is not nervous/anxious.     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   Anxiety 03/23/2018   Taking medication for chronic disease 03/23/2018   Other constipation 12/13/2017   Depression with anxiety 08/19/2017   Hyperlipidemia 08/19/2017   Gastroesophageal reflux disease without esophagitis 08/19/2017   Other chest pain 04/16/2015   COPD exacerbation ([place]) 04/16/2015   Sleep apnea 04/16/2015   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   Asthmatic bronchitis 04/16/2015   Abdominal pain, LLQ 03/29/2014   Rectal bleeding 03/29/2014   Arthritis, 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   Osteoarthrosis, unspecified whether generalized or localized, pelvic region and thigh 03/26/2014    Allergies  Allergen Reactions   Ciprofloxacin Other (See Comments)   Etodolac Other (See Comments)   Sulfa Antibiotics Rash    Past Surgical History:  Procedure Laterality Date   ABDOMINAL HYSTERECTOMY     COLONOSCOPY  06/08/2013   Dr Candace Cruise- small mouth diverticula   HIP SURGERY Left    KNEE SURGERY Bilateral    NASAL RECONSTRUCTION     x 2    Social History   Tobacco Use   Smoking status: Former Smoker   Smokeless tobacco: Never Used  Substance Use Topics   Alcohol use: No    Alcohol/week: 0.0 standard drinks   Drug use: No     Medication list has been reviewed and updated.  Current Meds  Medication Sig   acetaminophen (TYLENOL) 500 MG tablet Take 500 mg by mouth every 6 (six) hours as needed.   aspirin 81 MG chewable tablet Chew by mouth.   BREO ELLIPTA 100-25 MCG/INH AEPB Inhale 1  puff by mouth once daily   CALCIUM & MAGNESIUM CARBONATES PO Take 1 tablet by mouth daily.   Cyanocobalamin (VITAMIN B-12) 1000 MCG SUBL 1 tablet daily.   EQUATE STOOL SOFTENER 100 MG capsule Take 1 capsule by mouth twice daily   FLUoxetine (PROZAC) 10 MG capsule Take 1 capsule (10 mg total) by mouth daily.   FLUoxetine  (PROZAC) 20 MG capsule Take 1 capsule (20 mg total) by mouth daily.   fluticasone (FLONASE) 50 MCG/ACT nasal spray Use 2 spray(s) in each nostril once daily   gemfibrozil (LOPID) 600 MG tablet Take 1 tablet (600 mg total) by mouth 2 (two) times daily.   loratadine (CLARITIN) 10 MG tablet Take 1 tablet (10 mg total) by mouth daily.   Multiple Vitamins-Minerals (CENTRUM SILVER PO) Take 1 capsule by mouth daily.   pantoprazole (PROTONIX) 40 MG tablet Take 1 tablet (40 mg total) by mouth daily.   polyethylene glycol (MIRALAX) packet Take 17 g by mouth daily.    PHQ 2/9 Scores 12/20/2019 10/16/2019 07/31/2019 01/16/2019  PHQ - 2 Score 1 0 0 0  PHQ- 9 Score 2 - 0 -    BP Readings from Last 3 Encounters:  12/20/19 138/70  10/31/19 (!) 145/49  08/14/19 128/68    Physical Exam Vitals and nursing note reviewed.  Constitutional:      General: She is not in acute distress.    Appearance: Normal appearance. She is not diaphoretic.  HENT:     Head: Normocephalic and atraumatic.     Right Ear: Tympanic membrane, ear canal and external ear normal.     Left Ear: Tympanic membrane, ear canal and external ear normal. There is no impacted cerumen.     Nose: Nose normal. No congestion or rhinorrhea.     Mouth/Throat:     Mouth: Mucous membranes are moist.     Pharynx: Oropharynx is clear. No oropharyngeal exudate or posterior oropharyngeal erythema.  Eyes:     General: No scleral icterus.       Right eye: No discharge.        Left eye: No discharge.     Extraocular Movements: Extraocular movements intact.     Conjunctiva/sclera: Conjunctivae normal.     Pupils: Pupils are equal, round, and reactive to light.  Neck:     Thyroid: No thyromegaly.     Vascular: No carotid bruit or JVD.  Cardiovascular:     Rate and Rhythm: Normal rate and regular rhythm.     Pulses: Normal pulses.          Dorsalis pedis pulses are 2+ on the right side and 2+ on the left side.       Posterior tibial pulses are  2+ on the right side.     Heart sounds: Normal heart sounds, S1 normal and S2 normal. No murmur. No systolic murmur. No diastolic murmur. No friction rub. No gallop. No S3 or S4 sounds.   Pulmonary:     Effort: Pulmonary effort is normal. No respiratory distress.     Breath sounds: Normal breath sounds. No stridor. No wheezing, rhonchi or rales.  Chest:     Chest wall: No tenderness.     Breasts: Breasts are symmetrical.        Right: Normal. No swelling, bleeding, inverted nipple, mass, nipple discharge, skin change or tenderness.        Left: Normal. No swelling, bleeding, inverted nipple, mass, nipple discharge, skin change or tenderness.  Abdominal:  General: Abdomen is flat. Bowel sounds are normal. There is no distension.     Palpations: Abdomen is soft. There is no hepatomegaly, splenomegaly, mass or pulsatile mass.     Tenderness: There is no abdominal tenderness. There is no right CVA tenderness, left CVA tenderness or guarding.     Hernia: No hernia is present.  Genitourinary:    Rectum: Normal. Guaiac result negative. No mass, tenderness or anal fissure.  Musculoskeletal:        General: Normal range of motion.     Cervical back: Normal range of motion and neck supple. No rigidity.     Right lower leg: No edema.     Left lower leg: No edema.  Feet:     Right foot:     Skin integrity: Skin integrity normal.  Lymphadenopathy:     Head:     Right side of head: No submandibular, tonsillar or preauricular adenopathy.     Left side of head: No submandibular, tonsillar or preauricular adenopathy.     Cervical: No cervical adenopathy.     Upper Body:     Right upper body: No supraclavicular or axillary adenopathy.     Left upper body: No supraclavicular or axillary adenopathy.  Skin:    General: Skin is warm and dry.     Capillary Refill: Capillary refill takes less than 2 seconds.  Neurological:     Mental Status: She is alert.     Cranial Nerves: Cranial nerves are  intact.     Sensory: Sensation is intact.     Motor: Motor function is intact.     Deep Tendon Reflexes: Reflexes are normal and symmetric.     Reflex Scores:      Tricep reflexes are 2+ on the right side and 2+ on the left side.      Bicep reflexes are 2+ on the right side and 2+ on the left side.      Brachioradialis reflexes are 2+ on the right side and 2+ on the left side.      Patellar reflexes are 2+ on the right side and 2+ on the left side.      Achilles reflexes are 2+ on the right side and 2+ on the left side.    Wt Readings from Last 3 Encounters:  12/20/19 182 lb (82.6 kg)  10/31/19 187 lb (84.8 kg)  08/14/19 192 lb (87.1 kg)    BP 138/70    Pulse 60    Ht 5\' 3"  (1.6 m)    Wt 182 lb (82.6 kg)    BMI 32.24 kg/m   Assessment and Plan: 1. Annual physical exam No subjective/objective concerns noted during history and physical exam.  Patient most recent encounters as well as most recent imaging, labs, care elsewhere were reviewed.  We will obtain a lipid panel, renal function panel and CBC for evaluation. - Lipid Panel With LDL/HDL Ratio - Renal Function Panel - CBC with Differential/Platelet  2. Obesity (BMI 30.0-34.9) Health risks of being over weight were discussed and patient was counseled on weight loss options and exercise.  3. Hyperlipidemia, unspecified hyperlipidemia type Chronic.  Controlled.  Stable.  Continue present regimen and will check lipid panel for evaluation. - Lipid Panel With LDL/HDL Ratio  4. Taking medication for chronic disease Patient is on medications for which we will check for concerns with renal panel. - Renal Function Panel  5. Dizziness Patient has had some dizziness but does not have a significant pallor  on evaluation.  However we will recheck a CBC for evaluation of possible anemia. - CBC with Differential/Platelet

## 2019-12-21 LAB — CBC WITH DIFFERENTIAL/PLATELET
Basophils Absolute: 0.1 10*3/uL (ref 0.0–0.2)
Basos: 2 %
EOS (ABSOLUTE): 0.2 10*3/uL (ref 0.0–0.4)
Eos: 2 %
Hematocrit: 34.3 % (ref 34.0–46.6)
Hemoglobin: 11.7 g/dL (ref 11.1–15.9)
Immature Grans (Abs): 0 10*3/uL (ref 0.0–0.1)
Immature Granulocytes: 0 %
Lymphocytes Absolute: 2 10*3/uL (ref 0.7–3.1)
Lymphs: 28 %
MCH: 30.6 pg (ref 26.6–33.0)
MCHC: 34.1 g/dL (ref 31.5–35.7)
MCV: 90 fL (ref 79–97)
Monocytes Absolute: 0.4 10*3/uL (ref 0.1–0.9)
Monocytes: 6 %
Neutrophils Absolute: 4.5 10*3/uL (ref 1.4–7.0)
Neutrophils: 62 %
Platelets: 316 10*3/uL (ref 150–450)
RBC: 3.82 x10E6/uL (ref 3.77–5.28)
RDW: 12.1 % (ref 11.7–15.4)
WBC: 7.2 10*3/uL (ref 3.4–10.8)

## 2019-12-21 LAB — RENAL FUNCTION PANEL
Albumin: 4.6 g/dL (ref 3.7–4.7)
BUN/Creatinine Ratio: 18 (ref 12–28)
BUN: 11 mg/dL (ref 8–27)
CO2: 23 mmol/L (ref 20–29)
Calcium: 9.5 mg/dL (ref 8.7–10.3)
Chloride: 104 mmol/L (ref 96–106)
Creatinine, Ser: 0.62 mg/dL (ref 0.57–1.00)
GFR calc Af Amer: 98 mL/min/{1.73_m2} (ref >59)
GFR calc non Af Amer: 85 mL/min/{1.73_m2} (ref >59)
Glucose: 89 mg/dL (ref 65–99)
Phosphorus: 2.9 mg/dL — ABNORMAL LOW (ref 3.0–4.3)
Potassium: 4.4 mmol/L (ref 3.5–5.2)
Sodium: 140 mmol/L (ref 134–144)

## 2019-12-21 LAB — LIPID PANEL WITH LDL/HDL RATIO
Cholesterol, Total: 157 mg/dL (ref 100–199)
HDL: 48 mg/dL (ref >39)
LDL Chol Calc (NIH): 96 mg/dL (ref 0–99)
LDL/HDL Ratio: 2 ratio (ref 0.0–3.2)
Triglycerides: 66 mg/dL (ref 0–149)
VLDL Cholesterol Cal: 13 mg/dL (ref 5–40)

## 2019-12-25 ENCOUNTER — Encounter: Payer: Self-pay | Admitting: Internal Medicine

## 2019-12-25 ENCOUNTER — Telehealth: Payer: Self-pay

## 2019-12-25 ENCOUNTER — Ambulatory Visit: Payer: Medicare Other | Admitting: Internal Medicine

## 2019-12-25 VITALS — Ht 63.0 in | Wt 182.0 lb

## 2019-12-25 DIAGNOSIS — J449 Chronic obstructive pulmonary disease, unspecified: Secondary | ICD-10-CM | POA: Diagnosis not present

## 2019-12-25 DIAGNOSIS — Z9989 Dependence on other enabling machines and devices: Secondary | ICD-10-CM | POA: Diagnosis not present

## 2019-12-25 DIAGNOSIS — G4733 Obstructive sleep apnea (adult) (pediatric): Secondary | ICD-10-CM

## 2019-12-25 NOTE — Telephone Encounter (Signed)
Confirmed telephone visit on 12/25/2019. klh

## 2019-12-25 NOTE — Progress Notes (Signed)
[facility] [place], [place] [address]  Internal MEDICINE  Telephone Visit  Patient Name: Regina Macdonald  [id number]  [id number]  Date of Service: 12/25/2019  I connected with the patient at 1039 by telephone and verified the patients identity using two identifiers.   I discussed the limitations, risks, security and privacy concerns of performing an evaluation and management service by telephone and the availability of in person appointments. I also discussed with the patient that there may be a patient responsible charge related to the service.  The patient expressed understanding and agrees to proceed.    Chief Complaint  Patient presents with  . Telephone Assessment  . Telephone Screen  . Follow-up    cpap titration results  . Sleep Apnea  . COPD    HPI  Pt is seen today for cpap titration study results.  Her titration shows an optimal pressure of 8cm/h2o. Her oxygen level was 90% on this, and will likely need oxygen in with her cpap.  Will conduct an overnight oximetry once she has the cpap.  She does report she had oxygen with her cpap before.   Current Medication: Outpatient Encounter Medications as of 12/25/2019  Medication Sig Note  . acetaminophen (TYLENOL) 500 MG tablet Take 500 mg by mouth every 6 (six) hours as needed.   Marland Kitchen aspirin 81 MG chewable tablet Chew by mouth.   Marland Kitchen BREO ELLIPTA 100-25 MCG/INH AEPB Inhale 1 puff by mouth once daily   . CALCIUM & MAGNESIUM CARBONATES PO Take 1 tablet by mouth daily. 04/09/2015: Received from: [place]:   . Cyanocobalamin (VITAMIN B-12) 1000 MCG SUBL 1 tablet daily. 04/09/2015: Received from: [place]:   . EQUATE STOOL SOFTENER 100 MG capsule Take 1 capsule by mouth twice daily   . FLUoxetine (PROZAC) 10 MG capsule Take 1 capsule (10 mg total) by mouth daily.   Marland Kitchen FLUoxetine (PROZAC) 20 MG capsule Take 1 capsule (20 mg total) by mouth daily.    . fluticasone (FLONASE) 50 MCG/ACT nasal spray Use 2 spray(s) in each nostril once daily   . gemfibrozil (LOPID) 600 MG tablet Take 1 tablet (600 mg total) by mouth 2 (two) times daily.   Marland Kitchen loratadine (CLARITIN) 10 MG tablet Take 1 tablet (10 mg total) by mouth daily.   . Multiple Vitamins-Minerals (CENTRUM SILVER PO) Take 1 capsule by mouth daily.   . pantoprazole (PROTONIX) 40 MG tablet Take 1 tablet (40 mg total) by mouth daily.   . polyethylene glycol (MIRALAX) packet Take 17 g by mouth daily.    No facility-administered encounter medications on file as of 12/25/2019.    Surgical History: Past Surgical History:  Procedure Laterality Date  . ABDOMINAL HYSTERECTOMY    . COLONOSCOPY  06/08/2013   Dr Candace Cruise- small mouth diverticula  . HIP SURGERY Left   . KNEE SURGERY Bilateral   . NASAL RECONSTRUCTION     x 2    Medical History: Past Medical History:  Diagnosis Date  . Anxiety   . Asthma   . Cancer ([place]) cervical-1973  . COPD (chronic obstructive pulmonary disease) ([place])   . Depression   . Diverticula, colon   . Diverticulosis   . GERD (gastroesophageal reflux disease)   . Sleep apnea     Family History: Family History  Problem Relation Age of Onset  . Cirrhosis Father   . Coronary artery disease Mother   . Breast cancer Neg  Hx     Social History   Socioeconomic History  . Marital status: Divorced    Spouse name: Not on file  . Number of children: 3  . Years of education: Not on file  . Highest education level: High school graduate  Occupational History  . Occupation: retired  Tobacco Use  . Smoking status: Former Research scientist (life sciences)  . Smokeless tobacco: Never Used  Substance and Sexual Activity  . Alcohol use: No    Alcohol/week: 0.0 standard drinks  . Drug use: No  . Sexual activity: Not Currently    Birth control/protection: Post-menopausal  Other Topics Concern  . Not on file  Social History Narrative  . Not on file   Social Determinants of Health    Financial Resource Strain:   . Difficulty of Paying Living Expenses: Not on file  Food Insecurity:   . Worried About Charity fundraiser in the Last Year: Not on file  . Ran Out of Food in the Last Year: Not on file  Transportation Needs:   . Lack of Transportation (Medical): Not on file  . Lack of Transportation (Non-Medical): Not on file  Physical Activity:   . Days of Exercise per Week: Not on file  . Minutes of Exercise per Session: Not on file  Stress: No Stress Concern Present  . Feeling of Stress : Only a little  Social Connections:   . Frequency of Communication with Friends and Family: Not on file  . Frequency of Social Gatherings with Friends and Family: Not on file  . Attends Religious Services: Not on file  . Active Member of Clubs or Organizations: Not on file  . Attends Archivist Meetings: Not on file  . Marital Status: Not on file  Intimate Partner Violence:   . Fear of Current or Ex-Partner: Not on file  . Emotionally Abused: Not on file  . Physically Abused: Not on file  . Sexually Abused: Not on file      Review of Systems  Constitutional: Negative for chills, fatigue and unexpected weight change.  HENT: Negative for congestion, rhinorrhea, sneezing and sore throat.   Eyes: Negative for photophobia, pain and redness.  Respiratory: Negative for cough, chest tightness and shortness of breath.   Cardiovascular: Negative for chest pain and palpitations.  Gastrointestinal: Negative for abdominal pain, constipation, diarrhea, nausea and vomiting.  Endocrine: Negative.   Genitourinary: Negative for dysuria and frequency.  Musculoskeletal: Negative for arthralgias, back pain, joint swelling and neck pain.  Skin: Negative for rash.  Allergic/Immunologic: Negative.   Neurological: Negative for tremors and numbness.  Hematological: Negative for adenopathy. Does not bruise/bleed easily.  Psychiatric/Behavioral: Negative for behavioral problems and sleep  disturbance. The patient is not nervous/anxious.     Vital Signs: Ht 5\' 3"  (1.6 m)   Wt 182 lb (82.6 kg)   BMI 32.24 kg/m    Observation/Objective:  Well sounding, NAD noted.    Assessment/Plan: 1. OSA on CPAP Will order new cpap machine, patient will likely need an overnight oximetry in the future, due to her 90% spo2 on titration study.  - For home use only DME continuous positive airway pressure (CPAP)  2. Obstructive chronic bronchitis without exacerbation (HCC) Stable, continue present management.   3. Morbid obesity ([place]) Obesity Counseling: Risk Assessment: An assessment of behavioral risk factors was made today and includes lack of exercise sedentary lifestyle, lack of portion control and poor dietary habits.  Risk Modification Advice: She was counseled on portion control guidelines.  Restricting daily caloric intake to 1800. The detrimental long term effects of obesity on her health and ongoing poor compliance was also discussed with the patient.    General Counseling: Tahara verbalizes understanding of the findings of today's phone visit and agrees with plan of treatment. I have discussed any further diagnostic evaluation that may be needed or ordered today. We also reviewed her medications today. she has been encouraged to call the office with any questions or concerns that should arise related to todays visit.    Orders Placed This Encounter  Procedures  . For home use only DME continuous positive airway pressure (CPAP)    No orders of the defined types were placed in this encounter.   Time spent: 15 Minutes   Orson Gear AGNP-C Pulmonary medicine

## 2019-12-27 ENCOUNTER — Telehealth: Payer: Self-pay

## 2019-12-27 NOTE — Telephone Encounter (Signed)
Gave [facility] orders for new cpap set up > beth

## 2019-12-27 NOTE — Procedures (Signed)
[facility] [address] [place], [place] [address]  Patient Name: Regina Macdonald DOB: May 29, 1941   SLEEP STUDY INTERPRETATION  DATE OF SERVICE: December 13, 2019   SLEEP STUDY HISTORY: This patient is referred to the sleep lab for a baseline Polysomnography. Pertinent history includes a history of diagnosis of excessive daytime somnolence and snoring.  PROCEDURE: This overnight polysomnogram was performed using the Alice 5 acquisition system using the standard diagnostic protocol as outlined by the AASM. This includes 6 channels of EEG, 2 channelscannels of EOG, chin EMG, bilateral anterior tibialis EMG, nasal/oral thermister, PTAF, chest and abdominal wall movements, ECG and pulse oximetry. Apneas and Hypopneas were scored per AASM definition.  SLEEP ARCHITECHTURE: This is a baseline polysomnograph  study. The total recording time was 495.3 minutes and the patients total sleep time is noted to be 246.5 minutes. Sleep onset latency was 73.0 minutes and is prolonged.  Stage R sleep onset latency was 368.0 minutes. Sleep maintenance efficiency was 49.8% and is decreased.  Sleep staging expressed as a percentage of total sleep time demonstrated 14.6% N1, 45.4% N2 and 33.5% N3  sleep. Stage R represents 6.5% of total sleep time. This is decreased.  There were a total of 19 arousals  for an overall arousal index of 4.6 per hour of sleep. PLMS arousal were not noted. Arousals without respiratory events are  noted. This can contribute to sleep architechture disruption.  RESPIRATORY MONITORING:   Patient exhibits evidence of sleep disorderd breathing characterized by 0 central apneas, 1 obstructive apneas and 1 mixed apneas. There were 0 obstructive hypopneas and 2 RERAs. Most of the apneas/hypopneas were of obstructive variety. The total apnea hypopnea index (apneas and hypopneas per hour of sleep) is 0.5 respiratory events per hour and is within normal limits.  Respiratory  monitoring demonstrated very mild snoring through the night. There are a total of 21 snoring episodes representing 0.7% of sleep.   Baseline oxygen saturation during wakefulness was 95% and during NREM sleep averaged 94% through the night. Arterial saturation during REM sleep was 94% through the night. There was no significant  oxygen desaturation with the respiratory events. Arterial oxygen desaturation occurred of at least 4% was noted with a low saturation of 90%. The study was performed off oxygen.  CARDIAC MONITORING:   Average heart rate is 61 during sleep with a high of 70 beats per minute. Malignant arrhythmias were not noted.  CPAP titration: Patient was started on a pressure of 5 cm water pressure and was increased up to a maximum of 8 cm water pressure.  Patient tolerated the CPAP fairly well it appears on a pressure of 8 cm water pressure the patient had no central apneas 1 obstructive apneas 1 mixed apnea and no hypopneas for a AHI of 0.8/h and lowest saturation of 90% prior to that on a pressure of 7 she had a index of 0/h and lowest saturation of 92% this is stage R was noted on a CPAP pressure of 8  IMPRESSIONS:  --This overnight polysomnogram demonstrates no significant sleep apnea with an overall AHI 0.5 per hour. --The overall AHI was no worse  during Stage R. --There were associated insignificant arterial oxygen desaturations noted with a low saturation of 90% --There was no significant PLMS noted in this study. --There is very mild snoring noted throughout the study.    RECOMMENDATIONS:  --CPAP titration study is adequate to control this patient's obstructive sleep apnea.  It appears an optimal pressure 8  cm water pressure in this case.  Overnight oximetry on the CPAP should be considered. --Nasal decongestants and antihistamines may be of help for increased upper airways resistance when present. --Weight loss through dietary and lifestyle modification is recommended in  the presence of obesity. --A search for and treatment of any underlying cardiopulmonary disease is      recommended in the presence of oxygen desaturations. --Alternative treatment options if the patient is not willing to use CPAP include oral   appliances as well as surgical intervention which may help in the appropriate patient. --Clinical correlation is recommended. Please feel free to call the office for any further  questions or assistance in the care of this patient.     [name], MD [facility] Pulmonary Critical Care Medicine Sleep medicine

## 2020-01-08 ENCOUNTER — Telehealth: Payer: Self-pay

## 2020-01-08 NOTE — Telephone Encounter (Signed)
Screened and confirmed cpap download appt

## 2020-01-10 ENCOUNTER — Ambulatory Visit (INDEPENDENT_AMBULATORY_CARE_PROVIDER_SITE_OTHER): Payer: Medicare Other

## 2020-01-10 ENCOUNTER — Other Ambulatory Visit: Payer: Self-pay

## 2020-01-10 ENCOUNTER — Ambulatory Visit: Payer: Medicare Other | Admitting: Nurse Practitioner

## 2020-01-10 ENCOUNTER — Encounter: Payer: Self-pay | Admitting: Nurse Practitioner

## 2020-01-10 VITALS — BP 109/53 | HR 73 | Temp 97.2°F | Resp 18 | Ht 63.0 in | Wt 182.6 lb

## 2020-01-10 DIAGNOSIS — J441 Chronic obstructive pulmonary disease with (acute) exacerbation: Secondary | ICD-10-CM | POA: Diagnosis not present

## 2020-01-10 DIAGNOSIS — J069 Acute upper respiratory infection, unspecified: Secondary | ICD-10-CM

## 2020-01-10 DIAGNOSIS — G4733 Obstructive sleep apnea (adult) (pediatric): Secondary | ICD-10-CM | POA: Diagnosis not present

## 2020-01-10 MED ORDER — AZITHROMYCIN 250 MG PO TABS: ORAL_TABLET | ORAL | 0 refills | Status: DC

## 2020-01-10 NOTE — Progress Notes (Signed)
[facility] [place], [place] [address]  Internal MEDICINE  Office Visit Note  Patient Name: Regina Macdonald  [id number]  [id number]  Date of Service: 01/10/2020   Pt is here for a sick visit.  Chief Complaint  Patient presents with  . Sinusitis    spitting up creamy phlem     The patient is here for sick visit. She states that she has been fighting off a cold for about 2 weeks. States it went away on it's own, but then symptoms came right back. She started taking zicam, which did not really help. Yesterday, she started taking tylenol cold and flu medication. She states that this helped to dry her out, but then she developed congested cough. Was able to bring up some green colored phlegm. She denies headache or fever. She states that se has not been exposed to COVID 19 anytime in the past several weeks. She does have COPD. She states this is generally well managed. She has not noted any increased wheezing.        Current Medication:  Outpatient Encounter Medications as of 01/10/2020  Medication Sig Note  . acetaminophen (TYLENOL) 500 MG tablet Take 500 mg by mouth every 6 (six) hours as needed.   Marland Kitchen aspirin 81 MG chewable tablet Chew by mouth.   Marland Kitchen BREO ELLIPTA 100-25 MCG/INH AEPB Inhale 1 puff by mouth once daily   . CALCIUM & MAGNESIUM CARBONATES PO Take 1 tablet by mouth daily. 04/09/2015: Received from: [place]:   . Cyanocobalamin (VITAMIN B-12) 1000 MCG SUBL 1 tablet daily. 04/09/2015: Received from: [place]:   . EQUATE STOOL SOFTENER 100 MG capsule Take 1 capsule by mouth twice daily   . FLUoxetine (PROZAC) 10 MG capsule Take 1 capsule (10 mg total) by mouth daily.   Marland Kitchen FLUoxetine (PROZAC) 20 MG capsule Take 1 capsule (20 mg total) by mouth daily.   . fluticasone (FLONASE) 50 MCG/ACT nasal spray Use 2 spray(s) in each nostril once daily   . gemfibrozil (LOPID) 600 MG tablet Take 1  tablet (600 mg total) by mouth 2 (two) times daily.   Marland Kitchen loratadine (CLARITIN) 10 MG tablet Take 1 tablet (10 mg total) by mouth daily.   . Multiple Vitamins-Minerals (CENTRUM SILVER PO) Take 1 capsule by mouth daily.   . pantoprazole (PROTONIX) 40 MG tablet Take 1 tablet (40 mg total) by mouth daily.   . polyethylene glycol (MIRALAX) packet Take 17 g by mouth daily.   Marland Kitchen azithromycin (ZITHROMAX) 250 MG tablet z-pack - take as directed for 5 days for upper respiratory infection    No facility-administered encounter medications on file as of 01/10/2020.      Medical History: Past Medical History:  Diagnosis Date  . Anxiety   . Asthma   . Cancer ([place]) cervical-1973  . COPD (chronic obstructive pulmonary disease) ([place])   . Depression   . Diverticula, colon   . Diverticulosis   . GERD (gastroesophageal reflux disease)   . Sleep apnea      Today's Vitals   01/10/20 1048  BP: (!) 109/53  Pulse: 73  Resp: 18  Temp: (!) 97.2 F (36.2 C)  SpO2: 99%  Weight: 182 lb 9.6 oz (82.8 kg)  Height: 5\' 3"  (1.6 m)   Body mass index is 32.35 kg/m.  Review of Systems  Constitutional: Positive for fatigue. Negative for chills, fever and unexpected weight change.  HENT: Positive for  congestion, postnasal drip and rhinorrhea. Negative for sneezing, sore throat and tinnitus.   Respiratory: Positive for cough and wheezing. Negative for chest tightness and shortness of breath.   Cardiovascular: Negative for chest pain and palpitations.  Gastrointestinal: Negative for abdominal pain, constipation, diarrhea, nausea and vomiting.  Musculoskeletal: Negative for arthralgias, back pain, joint swelling and neck pain.  Skin: Negative for rash.  Allergic/Immunologic: Negative for environmental allergies.  Neurological: Negative for dizziness, tremors, numbness and headaches.  Hematological: Negative for adenopathy. Does not bruise/bleed easily.  Psychiatric/Behavioral: Negative for behavioral problems  (Depression), sleep disturbance and suicidal ideas. The patient is not nervous/anxious.     Physical Exam Vitals and nursing note reviewed.  Constitutional:      General: She is not in acute distress.    Appearance: Normal appearance. She is well-developed. She is not diaphoretic.  HENT:     Head: Normocephalic and atraumatic.     Right Ear: Ear canal and external ear normal.     Left Ear: Ear canal and external ear normal.     Nose: Congestion present.     Right Sinus: No maxillary sinus tenderness or frontal sinus tenderness.     Left Sinus: No maxillary sinus tenderness or frontal sinus tenderness.     Mouth/Throat:     Pharynx: No oropharyngeal exudate.  Eyes:     Pupils: Pupils are equal, round, and reactive to light.  Neck:     Thyroid: No thyromegaly.     Vascular: No JVD.     Trachea: No tracheal deviation.  Cardiovascular:     Rate and Rhythm: Normal rate and regular rhythm.     Heart sounds: Normal heart sounds. No murmur. No friction rub. No gallop.   Pulmonary:     Effort: Pulmonary effort is normal. No respiratory distress.     Breath sounds: No wheezing or rales.     Comments: Congested, non-productive cough present in the office.  Chest:     Chest wall: No tenderness.  Abdominal:     Palpations: Abdomen is soft.  Musculoskeletal:        General: Normal range of motion.     Cervical back: Normal range of motion and neck supple.  Lymphadenopathy:     Cervical: No cervical adenopathy.  Skin:    General: Skin is warm and dry.  Neurological:     Mental Status: She is alert and oriented to person, place, and time.     Cranial Nerves: No cranial nerve deficit.  Psychiatric:        Behavior: Behavior normal.        Thought Content: Thought content normal.        Judgment: Judgment normal.    Assessment/Plan:  1. Acute upper respiratory infection Start z-pack. Take as directed for 5 days. Rest and increase fluids. May continue to take OTC tylenol cold and  sinus as needed and as indicated.  - azithromycin (ZITHROMAX) 250 MG tablet; z-pack - take as directed for 5 days for upper respiratory infection  Dispense: 6 tablet; Refill: 0  2. COPD exacerbation ([place]) conitnue inhalers and respiratory medications as prescribed  General Counseling: [place] verbalizes understanding of the findings of todays visit and agrees with plan of treatment. I have discussed any further diagnostic evaluation that may be needed or ordered today. We also reviewed her medications today. she has been encouraged to call the office with any questions or concerns that should arise related to todays visit.    Counseling:  This patient was seen by [place] with Dr Lavera Guise as a part of collaborative care agreement  Meds ordered this encounter  Medications  . azithromycin (ZITHROMAX) 250 MG tablet    Sig: z-pack - take as directed for 5 days for upper respiratory infection    Dispense:  6 tablet    Refill:  0    Order Specific Question:   Supervising Provider    Answer:   Lavera Guise [id number]    Time spent: 20 Minutes

## 2020-01-10 NOTE — Progress Notes (Signed)
New cpap setup:  She was setup on new [employer] auto cpap 5-10 cmh2o headted humidifier and climate line tubing. She had just received supplies. Also she is doing an overnight oximeter on cpap

## 2020-01-15 ENCOUNTER — Encounter: Payer: Self-pay | Admitting: Family Medicine

## 2020-01-15 ENCOUNTER — Ambulatory Visit (INDEPENDENT_AMBULATORY_CARE_PROVIDER_SITE_OTHER): Payer: Medicare Other | Admitting: Family Medicine

## 2020-01-15 ENCOUNTER — Other Ambulatory Visit: Payer: Self-pay

## 2020-01-15 VITALS — BP 138/70 | HR 64 | Ht 63.0 in | Wt 183.0 lb

## 2020-01-15 DIAGNOSIS — F419 Anxiety disorder, unspecified: Secondary | ICD-10-CM | POA: Diagnosis not present

## 2020-01-15 DIAGNOSIS — J301 Allergic rhinitis due to pollen: Secondary | ICD-10-CM | POA: Diagnosis not present

## 2020-01-15 DIAGNOSIS — F320 Major depressive disorder, single episode, mild: Secondary | ICD-10-CM | POA: Diagnosis not present

## 2020-01-15 DIAGNOSIS — E785 Hyperlipidemia, unspecified: Secondary | ICD-10-CM

## 2020-01-15 DIAGNOSIS — K219 Gastro-esophageal reflux disease without esophagitis: Secondary | ICD-10-CM

## 2020-01-15 MED ORDER — LORATADINE 10 MG PO TABS: 10.0000 mg | ORAL_TABLET | Freq: Every day | ORAL | 1 refills | Status: DC

## 2020-01-15 MED ORDER — PANTOPRAZOLE SODIUM 40 MG PO TBEC: 40.0000 mg | DELAYED_RELEASE_TABLET | Freq: Every day | ORAL | 1 refills | Status: DC

## 2020-01-15 MED ORDER — FLUOXETINE HCL 10 MG PO CAPS: 10.0000 mg | ORAL_CAPSULE | Freq: Every day | ORAL | 5 refills | Status: DC

## 2020-01-15 MED ORDER — GEMFIBROZIL 600 MG PO TABS: 600.0000 mg | ORAL_TABLET | Freq: Two times a day (BID) | ORAL | 1 refills | Status: DC

## 2020-01-15 MED ORDER — FLUOXETINE HCL 20 MG PO CAPS: 20.0000 mg | ORAL_CAPSULE | Freq: Every day | ORAL | 5 refills | Status: DC

## 2020-01-15 NOTE — Progress Notes (Signed)
Date:  01/15/2020   Name:  Regina Macdonald   DOB:  22-Jun-1941   MRN:  [id number]   Chief Complaint: Hyperlipidemia, Gastroesophageal Reflux, and Depression (with anxiety)  Hyperlipidemia This is a chronic problem. The current episode started more than 1 year ago. The problem is controlled. Recent lipid tests were reviewed and are normal. She has no history of chronic renal disease, diabetes, hypothyroidism, liver disease, obesity or nephrotic syndrome. There are no known factors aggravating her hyperlipidemia. Pertinent negatives include no chest pain, focal sensory loss, focal weakness, leg pain, myalgias or shortness of breath. She is currently on no antihyperlipidemic treatment. The current treatment provides moderate improvement of lipids. There are no compliance problems.  Risk factors for coronary artery disease include dyslipidemia and hypertension.  Gastroesophageal Reflux She reports no abdominal pain, no belching, no chest pain, no choking, no coughing, no dysphagia, no early satiety, no globus sensation, no heartburn, no hoarse voice, no nausea, no sore throat or no wheezing. This is a chronic problem. The problem has been gradually improving. The symptoms are aggravated by certain foods. Pertinent negatives include no fatigue. She has tried a PPI for the symptoms. The treatment provided moderate relief.  Depression        This is a chronic problem.  The current episode started more than 1 year ago.   The onset quality is gradual.   The problem occurs intermittently.  The problem has been gradually improving since onset.  Associated symptoms include no decreased concentration, no fatigue, no helplessness, no hopelessness, does not have insomnia, not irritable, no restlessness, no decreased interest, no appetite change, no body aches, no myalgias, no headaches, no indigestion, not sad and no suicidal ideas.     The symptoms are aggravated by nothing.  Past treatments include SSRIs - Selective  serotonin reuptake inhibitors.  Compliance with treatment is good.  Previous treatment provided moderate relief.   Pertinent negatives include no hypothyroidism.   Lab Results  Component Value Date   CREATININE 0.62 12/20/2019   BUN 11 12/20/2019   NA 140 12/20/2019   K 4.4 12/20/2019   CL 104 12/20/2019   CO2 23 12/20/2019   Lab Results  Component Value Date   CHOL 157 12/20/2019   HDL 48 12/20/2019   LDLCALC 96 12/20/2019   TRIG 66 12/20/2019   CHOLHDL 3.0 08/19/2017   No results found for: TSH Lab Results  Component Value Date   HGBA1C 5.7 (H) 12/02/2015     Review of Systems  Constitutional: Negative.  Negative for appetite change, chills, fatigue, fever and unexpected weight change.  HENT: Negative for congestion, ear discharge, ear pain, hoarse voice, rhinorrhea, sinus pressure, sneezing and sore throat.   Eyes: Negative for photophobia, pain, discharge, redness and itching.  Respiratory: Negative for cough, choking, shortness of breath, wheezing and stridor.   Cardiovascular: Negative for chest pain.  Gastrointestinal: Negative for abdominal pain, blood in stool, constipation, diarrhea, dysphagia, heartburn, nausea and vomiting.  Endocrine: Negative for cold intolerance, heat intolerance, polydipsia, polyphagia and polyuria.  Genitourinary: Negative for dysuria, flank pain, frequency, hematuria, menstrual problem, pelvic pain, urgency, vaginal bleeding and vaginal discharge.  Musculoskeletal: Negative for arthralgias, back pain and myalgias.  Skin: Negative for rash.  Allergic/Immunologic: Negative for environmental allergies and food allergies.  Neurological: Negative for dizziness, focal weakness, weakness, light-headedness, numbness and headaches.  Hematological: Negative for adenopathy. Does not bruise/bleed easily.  Psychiatric/Behavioral: Positive for depression. Negative for decreased concentration, dysphoric mood and suicidal  ideas. The patient is not  nervous/anxious and does not have insomnia.     Patient Active Problem List   Diagnosis Date Noted  . Acute upper respiratory infection 01/10/2020  . Morbid obesity ([place]) 07/31/2019  . 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 Anxiety 03/23/2018  . Taking medication for chronic disease 03/23/2018  . Other constipation 12/13/2017  . Depression with anxiety 08/19/2017  . Hyperlipidemia 08/19/2017  . Gastroesophageal reflux disease without esophagitis 08/19/2017  . Other chest pain 04/16/2015  . COPD exacerbation ([place]) 04/16/2015  . Sleep apnea 04/16/2015  . 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 Asthmatic bronchitis 04/16/2015  . Abdominal pain, LLQ 03/29/2014  . Rectal bleeding 03/29/2014  . 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 Osteoarthrosis, unspecified whether generalized or localized, pelvic region and thigh 03/26/2014    Allergies  Allergen Reactions  . Ciprofloxacin Other (See Comments)  . Etodolac Other (See Comments)  . Sulfa Antibiotics Rash    Past Surgical History:  Procedure Laterality Date  . ABDOMINAL HYSTERECTOMY    . COLONOSCOPY  06/08/2013   Dr Candace Cruise- small mouth diverticula  . HIP SURGERY Left   . KNEE SURGERY Bilateral   . NASAL RECONSTRUCTION     x 2    Social History   Tobacco Use  . Smoking status: Former Research scientist (life sciences)  . Smokeless tobacco: Never Used  Substance Use Topics  . Alcohol use: No    Alcohol/week: 0.0 standard drinks  . Drug use: No     Medication list has been reviewed and updated.  Current Meds  Medication Sig  . acetaminophen (TYLENOL) 500 MG tablet Take 500 mg by mouth every 6 (six) hours as needed.  Marland Kitchen aspirin 81 MG chewable tablet Chew by mouth.  Marland Kitchen azithromycin (ZITHROMAX) 250 MG tablet z-pack - take as directed for 5 days for upper respiratory infection  . BREO ELLIPTA 100-25 MCG/INH AEPB Inhale 1 puff by mouth once daily  . CALCIUM & MAGNESIUM CARBONATES PO Take 1 tablet by mouth daily.  .  Cyanocobalamin (VITAMIN B-12) 1000 MCG SUBL 1 tablet daily.  Marland Kitchen EQUATE STOOL SOFTENER 100 MG capsule Take 1 capsule by mouth twice daily  . FLUoxetine (PROZAC) 10 MG capsule Take 1 capsule (10 mg total) by mouth daily.  Marland Kitchen FLUoxetine (PROZAC) 20 MG capsule Take 1 capsule (20 mg total) by mouth daily.  . fluticasone (FLONASE) 50 MCG/ACT nasal spray Use 2 spray(s) in each nostril once daily  . gemfibrozil (LOPID) 600 MG tablet Take 1 tablet (600 mg total) by mouth 2 (two) times daily.  Marland Kitchen loratadine (CLARITIN) 10 MG tablet Take 1 tablet (10 mg total) by mouth daily.  . Multiple Vitamins-Minerals (CENTRUM SILVER PO) Take 1 capsule by mouth daily.  . pantoprazole (PROTONIX) 40 MG tablet Take 1 tablet (40 mg total) by mouth daily.  . polyethylene glycol (MIRALAX) packet Take 17 g by mouth daily.    PHQ 2/9 Scores 01/15/2020 12/25/2019 12/20/2019 10/16/2019  PHQ - 2 Score 0 0 1 0  PHQ- 9 Score 0 - 2 -    BP Readings from Last 3 Encounters:  01/15/20 138/70  01/10/20 (!) 109/53  12/20/19 138/70    Physical Exam Vitals and nursing note reviewed.  Constitutional:      General: She is not irritable.She is not in acute distress.    Appearance: She is not diaphoretic.  HENT:     Head: Normocephalic and atraumatic.     Right Ear: Tympanic membrane and external  ear normal.     Left Ear: Tympanic membrane and external ear normal.     Nose: Nose normal. No congestion or rhinorrhea.     Mouth/Throat:     Mouth: Mucous membranes are moist.  Eyes:     General:        Right eye: No discharge.        Left eye: No discharge.     Conjunctiva/sclera: Conjunctivae normal.     Pupils: Pupils are equal, round, and reactive to light.  Neck:     Thyroid: No thyromegaly.     Vascular: No JVD.  Cardiovascular:     Rate and Rhythm: Normal rate and regular rhythm.     Heart sounds: Normal heart sounds. No murmur. No friction rub. No gallop.   Pulmonary:     Effort: Pulmonary effort is normal.     Breath  sounds: Normal breath sounds. No rhonchi or rales.  Abdominal:     General: Bowel sounds are normal.     Palpations: Abdomen is soft. There is no mass.     Tenderness: There is no abdominal tenderness. There is no guarding.  Musculoskeletal:        General: Normal range of motion.     Cervical back: Normal range of motion and neck supple.  Lymphadenopathy:     Cervical: No cervical adenopathy.  Skin:    General: Skin is warm and dry.     Capillary Refill: Capillary refill takes less than 2 seconds.  Neurological:     Mental Status: She is alert.     Deep Tendon Reflexes: Reflexes are normal and symmetric.     Wt Readings from Last 3 Encounters:  01/15/20 183 lb (83 kg)  01/10/20 182 lb 9.6 oz (82.8 kg)  12/25/19 182 lb (82.6 kg)    BP 138/70   Pulse 64   Ht 5\' 3"  (1.6 m)   Wt 183 lb (83 kg)   BMI 32.42 kg/m   Assessment and Plan:  1. Current mild episode of major depressive disorder, unspecified whether recurrent (HCC) Chronic.  Controlled.  Stable.  PHQ is 0 patient is under good disposition at this time.  We will continue fluoxetine 30 mg which is a 10+20 mg dosing. - FLUoxetine (PROZAC) 10 MG capsule; Take 1 capsule (10 mg total) by mouth daily.  Dispense: 30 capsule; Refill: 5 - FLUoxetine (PROZAC) 20 MG capsule; Take 1 capsule (20 mg total) by mouth daily.  Dispense: 30 capsule; Refill: 5  2. Anxiety Chronic.  Controlled.  Stable.  Gad score is 0.  Continue fluoxetine at 30 mg once a day. - FLUoxetine (PROZAC) 10 MG capsule; Take 1 capsule (10 mg total) by mouth daily.  Dispense: 30 capsule; Refill: 5 - FLUoxetine (PROZAC) 20 MG capsule; Take 1 capsule (20 mg total) by mouth daily.  Dispense: 30 capsule; Refill: 5  3. Hyperlipidemia, unspecified hyperlipidemia type Chronic.  Controlled.  Stable.  Reviewed last lipid panel which is acceptable.  We will continue gemfibrozil 600 mg twice a day. - gemfibrozil (LOPID) 600 MG tablet; Take 1 tablet (600 mg total) by mouth  2 (two) times daily.  Dispense: 180 tablet; Refill: 1  4. Seasonal allergic rhinitis due to pollen Chronic.  Controlled.  Stable.  Continue loratadine 10 mg once a day. - loratadine (CLARITIN) 10 MG tablet; Take 1 tablet (10 mg total) by mouth daily.  Dispense: 90 tablet; Refill: 1  5. Gastroesophageal reflux disease without esophagitis Chronic.  Controlled.  Stable.  Continue pantoprazole 40 mg once a day. - pantoprazole (PROTONIX) 40 MG tablet; Take 1 tablet (40 mg total) by mouth daily.  Dispense: 90 tablet; Refill: 1

## 2020-01-16 DIAGNOSIS — G4733 Obstructive sleep apnea (adult) (pediatric): Secondary | ICD-10-CM | POA: Diagnosis not present

## 2020-02-02 ENCOUNTER — Other Ambulatory Visit: Payer: Self-pay | Admitting: Family Medicine

## 2020-02-02 DIAGNOSIS — J309 Allergic rhinitis, unspecified: Secondary | ICD-10-CM

## 2020-02-07 ENCOUNTER — Ambulatory Visit: Payer: Medicare Other

## 2020-02-07 DIAGNOSIS — G4733 Obstructive sleep apnea (adult) (pediatric): Secondary | ICD-10-CM | POA: Diagnosis not present

## 2020-02-12 ENCOUNTER — Telehealth: Payer: Self-pay

## 2020-02-12 ENCOUNTER — Ambulatory Visit: Payer: Medicare Other | Admitting: Internal Medicine

## 2020-02-12 NOTE — Telephone Encounter (Signed)
Unable to leave message confirming pt cpap download appt

## 2020-02-14 ENCOUNTER — Ambulatory Visit (INDEPENDENT_AMBULATORY_CARE_PROVIDER_SITE_OTHER): Payer: Medicare Other

## 2020-02-14 ENCOUNTER — Telehealth: Payer: Self-pay

## 2020-02-14 ENCOUNTER — Other Ambulatory Visit: Payer: Self-pay

## 2020-02-14 DIAGNOSIS — G4733 Obstructive sleep apnea (adult) (pediatric): Secondary | ICD-10-CM | POA: Diagnosis not present

## 2020-02-14 NOTE — Telephone Encounter (Signed)
Confirmed appointment on 02/19/2020 and screened for covid. [facility] 

## 2020-02-14 NOTE — Progress Notes (Signed)
95 percentile pressure 9.8   95th percentile leak 38.8   apnea index 2.4 /hr  apnea-hypopnea index  3.8 /hr   total days used  >4 hr 30 days  total days used <4 hr 0 days  Total compliance 100 percent She is doing great no problems or questions. States sleeping better on new cpap

## 2020-02-19 ENCOUNTER — Encounter: Payer: Self-pay | Admitting: Internal Medicine

## 2020-02-19 ENCOUNTER — Other Ambulatory Visit: Payer: Self-pay

## 2020-02-19 ENCOUNTER — Ambulatory Visit: Payer: Medicare Other | Admitting: Internal Medicine

## 2020-02-19 VITALS — BP 115/55 | HR 87 | Temp 97.3°F | Resp 16 | Ht 63.0 in | Wt 184.0 lb

## 2020-02-19 DIAGNOSIS — Z9989 Dependence on other enabling machines and devices: Secondary | ICD-10-CM | POA: Diagnosis not present

## 2020-02-19 DIAGNOSIS — G4733 Obstructive sleep apnea (adult) (pediatric): Secondary | ICD-10-CM

## 2020-02-19 DIAGNOSIS — Z6832 Body mass index (BMI) 32.0-32.9, adult: Secondary | ICD-10-CM

## 2020-02-19 DIAGNOSIS — J449 Chronic obstructive pulmonary disease, unspecified: Secondary | ICD-10-CM | POA: Diagnosis not present

## 2020-02-19 NOTE — Progress Notes (Signed)
[facility] [place], [place] [address]  Pulmonary Sleep Medicine   Office Visit Note  Patient Name: Regina Macdonald DOB: 04/04/1941 MRN [id number]  Date of Service: 02/19/2020  Complaints/HPI: Pt is seen today for pulmonary follow up.  She reports she has been wearing her cpap for about 5 weeks. Pt reports good compliance with CPAP therapy. Cleaning machine by hand, and changing filters and tubing as directed. Denies headaches, sinus issues, palpitations, or hemoptysis.  She also states she did an overnight oximetry recently with her cpap on. Those results are not available to me at the time of visit.   ROS  General: (-) fever, (-) chills, (-) night sweats, (-) weakness Skin: (-) rashes, (-) itching,. Eyes: (-) visual changes, (-) redness, (-) itching. Nose and Sinuses: (-) nasal stuffiness or itchiness, (-) postnasal drip, (-) nosebleeds, (-) sinus trouble. Mouth and Throat: (-) sore throat, (-) hoarseness. Neck: (-) swollen glands, (-) enlarged thyroid, (-) neck pain. Respiratory: - cough, (-) bloody sputum, - shortness of breath, - wheezing. Cardiovascular: - ankle swelling, (-) chest pain. Lymphatic: (-) lymph node enlargement. Neurologic: (-) numbness, (-) tingling. Psychiatric: (-) anxiety, (-) depression   Current Medication: Outpatient Encounter Medications as of 02/19/2020  Medication Sig Note  . acetaminophen (TYLENOL) 500 MG tablet Take 500 mg by mouth every 6 (six) hours as needed.   Marland Kitchen aspirin 81 MG chewable tablet Chew by mouth.   Marland Kitchen azithromycin (ZITHROMAX) 250 MG tablet z-pack - take as directed for 5 days for upper respiratory infection   . BREO ELLIPTA 100-25 MCG/INH AEPB Inhale 1 puff by mouth once daily   . CALCIUM & MAGNESIUM CARBONATES PO Take 1 tablet by mouth daily. 04/09/2015: Received from: [place]:   . Cyanocobalamin (VITAMIN B-12) 1000 MCG SUBL 1 tablet daily. 04/09/2015: Received from: [place]:   . EQUATE STOOL SOFTENER 100 MG capsule Take 1 capsule by mouth twice daily   . FLUoxetine (PROZAC) 10 MG capsule Take 1 capsule (10 mg total) by mouth daily.   Marland Kitchen FLUoxetine (PROZAC) 20 MG capsule Take 1 capsule (20 mg total) by mouth daily.   . fluticasone (FLONASE) 50 MCG/ACT nasal spray Use 2 spray(s) in each nostril once daily   . gemfibrozil (LOPID) 600 MG tablet Take 1 tablet (600 mg total) by mouth 2 (two) times daily.   Marland Kitchen loratadine (CLARITIN) 10 MG tablet Take 1 tablet (10 mg total) by mouth daily.   . Multiple Vitamins-Minerals (CENTRUM SILVER PO) Take 1 capsule by mouth daily.   . pantoprazole (PROTONIX) 40 MG tablet Take 1 tablet (40 mg total) by mouth daily.   . polyethylene glycol (MIRALAX) packet Take 17 g by mouth daily.    No facility-administered encounter medications on file as of 02/19/2020.    Surgical History: Past Surgical History:  Procedure Laterality Date  . ABDOMINAL HYSTERECTOMY    . COLONOSCOPY  06/08/2013   Dr Candace Cruise- small mouth diverticula  . HIP SURGERY Left   . KNEE SURGERY Bilateral   . NASAL RECONSTRUCTION     x 2    Medical History: Past Medical History:  Diagnosis Date  . Anxiety   . Asthma   . 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 COPD (chronic obstructive pulmonary disease) ([place])   . Depression   . Diverticula, colon   . Diverticulosis   . GERD (gastroesophageal reflux disease)   . Sleep apnea     Family  History: Family History  Problem Relation Age of Onset  . Cirrhosis Father   . Coronary artery disease Mother   . Breast cancer Neg Hx     Social History: Social History   Socioeconomic History  . Marital status: Divorced    Spouse name: Not on file  . Number of children: 3  . Years of education: Not on file  . Highest education level: High school graduate  Occupational History  . Occupation: retired  Tobacco Use  . Smoking status: Former Research scientist (life sciences)  . Smokeless tobacco: Never Used  Substance  and Sexual Activity  . Alcohol use: No    Alcohol/week: 0.0 standard drinks  . Drug use: No  . Sexual activity: Not Currently    Birth control/protection: Post-menopausal  Other Topics Concern  . Not on file  Social History Narrative  . Not on file   Social Determinants of Health   Financial Resource Strain:   . Difficulty of Paying Living Expenses:   Food Insecurity:   . Worried About Charity fundraiser in the Last Year:   . Arboriculturist in the Last Year:   Transportation Needs:   . Film/video editor (Medical):   Marland Kitchen Lack of Transportation (Non-Medical):   Physical Activity:   . Days of Exercise per Week:   . Minutes of Exercise per Session:   Stress: No Stress Concern Present  . Feeling of Stress : Only a little  Social Connections:   . Frequency of Communication with Friends and Family:   . Frequency of Social Gatherings with Friends and Family:   . Attends Religious Services:   . Active Member of Clubs or Organizations:   . Attends Archivist Meetings:   Marland Kitchen Marital Status:   Intimate Partner Violence:   . Fear of Current or Ex-Partner:   . Emotionally Abused:   Marland Kitchen Physically Abused:   . Sexually Abused:     Vital Signs: Blood pressure (!) 115/55, pulse 87, temperature (!) 97.3 F (36.3 C), resp. rate 16, height 5\' 3"  (1.6 m), weight 184 lb (83.5 kg), SpO2 97 %.  Examination: General Appearance: The patient is well-developed, well-nourished, and in no distress. Skin: Gross inspection of skin unremarkable. Head: normocephalic, no gross deformities. Eyes: no gross deformities noted. ENT: ears appear grossly normal no exudates. Neck: Supple. No thyromegaly. No LAD. Respiratory: clear bialaterally. Cardiovascular: Normal S1 and S2 without murmur or rub. Extremities: No cyanosis. pulses are equal. Neurologic: Alert and oriented. No involuntary movements.  LABS: Recent Results (from the past 2160 hour(s))  Novel Coronavirus, NAA ([facility])      Status: None   Collection Time: 11/22/19 10:17 AM   Specimen: Nasopharyngeal(NP) swabs in vial transport medium   NASOPHARYNGE  TESTING  Result Value Ref Range   SARS-CoV-2, NAA Not Detected Not Detected    Comment: This nucleic acid amplification test was developed and its performance characteristics determined by [employer]. Nucleic acid amplification tests include PCR and TMA. This test has not been FDA cleared or approved. This test has been authorized by FDA under an Emergency Use Authorization (EUA). This test is only authorized for the duration of time the declaration that circumstances exist justifying the authorization of the emergency use of in vitro diagnostic tests for detection of SARS-CoV-2 virus and/or diagnosis of COVID-19 infection under section 564(b)(1) of the Act, 21 U.S.C. [id number]) (1), unless the authorization is terminated or revoked sooner. When diagnostic testing is negative, the possibility of a false  negative result should be considered in the context of a patient's recent exposures and the presence of clinical signs and symptoms consistent with COVID-19. An individual without symptoms of COVID-19 and who is not shedding SARS-CoV-2 virus would  expect to have a negative (not detected) result in this assay.   Lipid Panel With LDL/HDL Ratio     Status: None   Collection Time: 12/20/19 10:55 AM  Result Value Ref Range   Cholesterol, Total 157 100 - 199 mg/dL   Triglycerides 66 0 - 149 mg/dL   HDL 48 >39 mg/dL   VLDL Cholesterol Cal 13 5 - 40 mg/dL   LDL Chol Calc (NIH) 96 0 - 99 mg/dL   LDL/HDL Ratio 2.0 0.0 - 3.2 ratio    Comment:                                     LDL/HDL Ratio                                             Men  Women                               1/2 Avg.Risk  1.0    1.5                                   Avg.Risk  3.6    3.2                                2X Avg.Risk  6.2    5.0                                3X Avg.Risk   8.0    6.1   Renal Function Panel     Status: Abnormal   Collection Time: 12/20/19 10:55 AM  Result Value Ref Range   Glucose 89 65 - 99 mg/dL   BUN 11 8 - 27 mg/dL   Creatinine, Ser 0.62 0.57 - 1.00 mg/dL   GFR calc non Af Amer 85 >59 mL/min/1.73   GFR calc Af Amer 98 >59 mL/min/1.73   BUN/Creatinine Ratio 18 12 - 28   Sodium 140 134 - 144 mmol/L   Potassium 4.4 3.5 - 5.2 mmol/L   Chloride 104 96 - 106 mmol/L   CO2 23 20 - 29 mmol/L   Calcium 9.5 8.7 - 10.3 mg/dL   Phosphorus 2.9 (L) 3.0 - 4.3 mg/dL   Albumin 4.6 3.7 - 4.7 g/dL  CBC with Differential/Platelet     Status: None   Collection Time: 12/20/19 10:55 AM  Result Value Ref Range   WBC 7.2 3.4 - 10.8 x10E3/uL   RBC 3.82 3.77 - 5.28 x10E6/uL   Hemoglobin 11.7 11.1 - 15.9 g/dL   Hematocrit 34.3 34.0 - 46.6 %   MCV 90 79 - 97 fL   MCH 30.6 26.6 - 33.0 pg   MCHC 34.1 31.5 - 35.7 g/dL   RDW 12.1 11.7 - 15.4 %   Platelets 316 150 - 450  x10E3/uL   Neutrophils 62 Not Estab. %   Lymphs 28 Not Estab. %   Monocytes 6 Not Estab. %   Eos 2 Not Estab. %   Basos 2 Not Estab. %   Neutrophils Absolute 4.5 1.4 - 7.0 x10E3/uL   Lymphocytes Absolute 2.0 0.7 - 3.1 x10E3/uL   Monocytes Absolute 0.4 0.1 - 0.9 x10E3/uL   EOS (ABSOLUTE) 0.2 0.0 - 0.4 x10E3/uL   Basophils Absolute 0.1 0.0 - 0.2 x10E3/uL   Immature Granulocytes 0 Not Estab. %   Immature Grans (Abs) 0.0 0.0 - 0.1 x10E3/uL    Radiology: MM 3D SCREEN BREAST BILATERAL  Result Date: 05/10/2019 CLINICAL DATA:  Screening. EXAM: DIGITAL SCREENING BILATERAL MAMMOGRAM WITH TOMO AND CAD COMPARISON:  Previous exam(s). ACR Breast Density Category b: There are scattered areas of fibroglandular density. FINDINGS: There are no findings suspicious for malignancy. Images were processed with CAD. IMPRESSION: No mammographic evidence of malignancy. A result letter of this screening mammogram will be mailed directly to the patient. RECOMMENDATION: Screening mammogram in one year.  (Code:SM-B-01Y) BI-RADS CATEGORY  1: Negative. Electronically Signed   By: Lovey Newcomer M.D.   On: 05/10/2019 12:15    No results found.  No results found.    Assessment and Plan: Patient Active Problem List   Diagnosis Date Noted  . Acute upper respiratory infection 01/10/2020  . Morbid obesity ([place]) 07/31/2019  . 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 Anxiety 03/23/2018  . Taking medication for chronic disease 03/23/2018  . Other constipation 12/13/2017  . Depression with anxiety 08/19/2017  . Hyperlipidemia 08/19/2017  . Gastroesophageal reflux disease without esophagitis 08/19/2017  . Other chest pain 04/16/2015  . COPD exacerbation ([place]) 04/16/2015  . Sleep apnea 04/16/2015  . 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 Asthmatic bronchitis 04/16/2015  . Abdominal pain, LLQ 03/29/2014  . Rectal bleeding 03/29/2014  . 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 Osteoarthrosis, unspecified whether generalized or localized, pelvic region and thigh 03/26/2014    1. OSA on CPAP Continue to use cpap at night as directed.  Good relief of symptoms.  Sleeping well.   2. Obstructive chronic bronchitis without exacerbation ([place]) Stable, continue present management with Breo.  Good symptom relief.   3. BMI 32.0-32.9,adult Obesity Counseling: Risk Assessment: An assessment of behavioral risk factors was made today and includes lack of exercise sedentary lifestyle, lack of portion control and poor dietary habits.  Risk Modification Advice: She was counseled on portion control guidelines. Restricting daily caloric intake to 1800. The detrimental long term effects of obesity on her health and ongoing poor compliance was also discussed with the patient.    General Counseling: I have discussed the findings of the evaluation and examination with [place].  I have also discussed any further diagnostic evaluation thatmay be needed or ordered today. Adenike verbalizes  understanding of the findings of todays visit. We also reviewed her medications today and discussed drug interactions and side effects including but not limited excessive drowsiness and altered mental states. We also discussed that there is always a risk not just to her but also people around her. she has been encouraged to call the office with any questions or concerns that should arise related to todays visit.  No orders of the defined types were placed in this encounter.    Time spent: 30 This patient was seen by Orson Gear AGNP-C in Collaboration with Dr. Devona Konig as a part of collaborative care agreement.   I have  personally obtained a history, examined the patient, evaluated laboratory and imaging results, formulated the assessment and plan and placed orders.    [name], MD [facility] Pulmonary and Critical Care Sleep medicine

## 2020-03-07 ENCOUNTER — Other Ambulatory Visit: Payer: Self-pay | Admitting: Internal Medicine

## 2020-03-07 DIAGNOSIS — J309 Allergic rhinitis, unspecified: Secondary | ICD-10-CM

## 2020-03-07 NOTE — Telephone Encounter (Signed)
Requested Prescriptions  Pending Prescriptions Disp Refills  . fluticasone (FLONASE) 50 MCG/ACT nasal spray [Pharmacy Med Name: Fluticasone Propionate 50 MCG/ACT Nasal Suspension] 16 g 1    Sig: Use 2 spray(s) in each nostril once daily     Ear, Nose, and Throat: Nasal Preparations - Corticosteroids Passed - 03/07/2020  1:18 PM      Passed - Valid encounter within last 12 months    Recent Outpatient Visits          1 month ago Current mild episode of major depressive disorder, unspecified whether recurrent ([place])   [facility] Juline Patch, MD   2 months ago Annual physical exam   [facility] Juline Patch, MD   7 months ago Current mild episode of major depressive disorder, unspecified whether recurrent [facility])   [facility] Juline Patch, MD   1 year ago Seasonal allergic rhinitis due to pollen   [facility] Juline Patch, MD   1 year ago Atelectasis   [facility] Juline Patch, MD

## 2020-03-08 DIAGNOSIS — G4733 Obstructive sleep apnea (adult) (pediatric): Secondary | ICD-10-CM | POA: Diagnosis not present

## 2020-03-09 DIAGNOSIS — G4733 Obstructive sleep apnea (adult) (pediatric): Secondary | ICD-10-CM | POA: Diagnosis not present

## 2020-03-11 DIAGNOSIS — H25813 Combined forms of age-related cataract, bilateral: Secondary | ICD-10-CM | POA: Diagnosis not present

## 2020-04-01 ENCOUNTER — Ambulatory Visit: Payer: Self-pay | Admitting: Family Medicine

## 2020-04-01 ENCOUNTER — Telehealth: Payer: Self-pay | Admitting: Family Medicine

## 2020-04-01 NOTE — Telephone Encounter (Signed)
Will see tomorrow for "jaw dropping"

## 2020-04-01 NOTE — Telephone Encounter (Signed)
Pt reports "Jaw just drops sometimes if I've been talking a lot."  Onset "Mid April  after covid vaccine." Denies any numbness, tingling, no pain. No unilateral weakness, denies headache, dizziness,no facial drooping, speech clear and non halting during call. States she can move jaw "Like I normally can do, just drops down sometimes when I'm talking, thought I should get checked out."  Appt made with Dr. Ronnald Ramp for 04/02/2020, covid screening completed. Advised ED if symptom worsens, any numbness, one sided weakness, facial droop. Pt verbalizes understanding.   Reason for Disposition . [1] Weakness of arm / hand, or leg / foot AND [2] is a chronic symptom (recurrent or ongoing AND present > 4 weeks)  Answer Assessment - Initial Assessment Questions 1. SYMPTOM: "What is the main symptom you are concerned about?" (e.g., weakness, numbness)     'Jaw drops when talking' 2. ONSET: "When did this start?" (minutes, hours, days; while sleeping)     Mid April 3. LAST NORMAL: "When was the last time you were normal (no symptoms)?"     Mid April 4. PATTERN "Does this come and go, or has it been constant since it started?"  "Is it present now?"     Intermittent 5. CARDIAC SYMPTOMS: "Have you had any of the following symptoms: chest pain, difficulty breathing, palpitations?"      6. NEUROLOGIC SYMPTOMS: "Have you had any of the following symptoms: headache, dizziness, vision loss, double vision, changes in speech, unsteady on your feet?"     none 7. OTHER SYMPTOMS: "Do you have any other symptoms?"     no  Protocols used: NEUROLOGIC DEFICIT-A-AH

## 2020-04-02 ENCOUNTER — Encounter: Payer: Self-pay | Admitting: Family Medicine

## 2020-04-02 ENCOUNTER — Other Ambulatory Visit: Payer: Self-pay

## 2020-04-02 ENCOUNTER — Ambulatory Visit (INDEPENDENT_AMBULATORY_CARE_PROVIDER_SITE_OTHER): Payer: Medicare Other | Admitting: Family Medicine

## 2020-04-02 VITALS — BP 130/60 | HR 80 | Ht 63.0 in | Wt 193.4 lb

## 2020-04-02 DIAGNOSIS — IMO0001 Reserved for inherently not codable concepts without codable children: Secondary | ICD-10-CM

## 2020-04-02 DIAGNOSIS — S0300XA Dislocation of jaw, unspecified side, initial encounter: Secondary | ICD-10-CM

## 2020-04-02 NOTE — Progress Notes (Signed)
Date:  04/02/2020   Name:  Regina Macdonald   DOB:  [birth year]   MRN:  [id number]   Chief Complaint: Jaw problem (When talking sometimes her jaw drops down- X 1 month. Started after the 2nd covid vaccine. feels like a "twitch." )  Neurologic Problem The patient's pertinent negatives include no altered mental status, clumsiness, focal sensory loss, focal weakness, loss of balance, memory loss, near-syncope, slurred speech, syncope, visual change or weakness. 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 This is a new problem. Episode onset: about 4 weeks. The neurological problem developed suddenly. Progression since onset: comes and goes. There was no focality noted. Pertinent negatives include no abdominal pain, auditory change, aura, back pain, bladder incontinence, bowel incontinence, chest pain, confusion, diaphoresis, dizziness, fatigue, fever, headaches, light-headedness, nausea, neck pain, palpitations, shortness of breath, vertigo or vomiting. Past treatments include nothing. There is no history of a bleeding disorder, a clotting disorder, a CVA, dementia, head trauma, liver disease, mood changes or seizures.    Lab Results  Component Value Date   CREATININE 0.62 12/20/2019   BUN 11 12/20/2019   NA 140 12/20/2019   K 4.4 12/20/2019   CL 104 12/20/2019   CO2 23 12/20/2019   Lab Results  Component Value Date   CHOL 157 12/20/2019   HDL 48 12/20/2019   LDLCALC 96 12/20/2019   TRIG 66 12/20/2019   CHOLHDL 3.0 08/19/2017   No results found for: TSH Lab Results  Component Value Date   HGBA1C 5.7 (H) 12/02/2015   Lab Results  Component Value Date   WBC 7.2 12/20/2019   HGB 11.7 12/20/2019   HCT 34.3 12/20/2019   MCV 90 12/20/2019   PLT 316 12/20/2019   Lab Results  Component Value Date   ALT 11 07/31/2019   AST 21 07/31/2019   ALKPHOS 112 07/31/2019   BILITOT 0.3 07/31/2019     Review of Systems  Constitutional: Negative.  Negative for  chills, diaphoresis, fatigue, fever and unexpected weight change.  HENT: Negative for congestion, ear discharge, ear pain, rhinorrhea, sinus pressure, sneezing and sore throat.   Eyes: Negative for photophobia, pain, discharge, redness and itching.  Respiratory: Negative for cough, shortness of breath, wheezing and stridor.   Cardiovascular: Negative for chest pain, palpitations and near-syncope.  Gastrointestinal: Negative for abdominal pain, blood in stool, bowel incontinence, constipation, diarrhea, nausea and vomiting.  Endocrine: Negative for cold intolerance, heat intolerance, polydipsia, polyphagia and polyuria.  Genitourinary: Negative for bladder incontinence, dysuria, flank pain, frequency, hematuria, menstrual problem, pelvic pain, urgency, vaginal bleeding and vaginal discharge.  Musculoskeletal: Negative for arthralgias, back pain, myalgias and neck pain.  Skin: Negative for rash.  Allergic/Immunologic: Negative for environmental allergies and food allergies.  Neurological: Negative for dizziness, vertigo, focal weakness, syncope, weakness, light-headedness, numbness, headaches and loss of balance.  Hematological: Negative for adenopathy. Does not bruise/bleed easily.  Psychiatric/Behavioral: Negative for confusion, dysphoric mood and memory loss. The patient is not nervous/anxious.     Patient Active Problem List   Diagnosis Date Noted  . Acute upper respiratory infection 01/10/2020  . Morbid obesity ([place]) 07/31/2019  . Mild episode of recurrent major depressive disorder ([place]) 03/23/2018  . Anxiety 03/23/2018  . Taking medication for chronic disease 03/23/2018  . Other constipation 12/13/2017  . Depression with anxiety 08/19/2017  . Hyperlipidemia 08/19/2017  . Gastroesophageal reflux disease without esophagitis 08/19/2017  . Other chest pain 04/16/2015  . COPD exacerbation ([place]) 04/16/2015  . Sleep apnea 04/16/2015  .  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 Asthmatic  bronchitis 04/16/2015  . Abdominal pain, LLQ 03/29/2014  . Rectal bleeding 03/29/2014  . 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 Osteoarthrosis, unspecified whether generalized or localized, pelvic region and thigh 03/26/2014    Allergies  Allergen Reactions  . Ciprofloxacin Other (See Comments)  . Etodolac Other (See Comments)  . Sulfa Antibiotics Rash    Past Surgical History:  Procedure Laterality Date  . ABDOMINAL HYSTERECTOMY    . COLONOSCOPY  06/08/2013   Dr Candace Cruise- small mouth diverticula  . HIP SURGERY Left   . KNEE SURGERY Bilateral   . NASAL RECONSTRUCTION     x 2    Social History   Tobacco Use  . Smoking status: Former Research scientist (life sciences)  . Smokeless tobacco: Never Used  Substance Use Topics  . Alcohol use: No    Alcohol/week: 0.0 standard drinks  . Drug use: No     Medication list has been reviewed and updated.  Current Meds  Medication Sig  . acetaminophen (TYLENOL) 500 MG tablet Take 500 mg by mouth every 6 (six) hours as needed.  Marland Kitchen aspirin 81 MG chewable tablet Chew by mouth.  Marland Kitchen BREO ELLIPTA 100-25 MCG/INH AEPB Inhale 1 puff by mouth once daily  . CALCIUM & MAGNESIUM CARBONATES PO Take 1 tablet by mouth daily.  . Cyanocobalamin (VITAMIN B-12) 1000 MCG SUBL 1 tablet daily.  Marland Kitchen EQUATE STOOL SOFTENER 100 MG capsule Take 1 capsule by mouth twice daily  . FLUoxetine (PROZAC) 10 MG capsule Take 1 capsule (10 mg total) by mouth daily.  Marland Kitchen FLUoxetine (PROZAC) 20 MG capsule Take 1 capsule (20 mg total) by mouth daily.  . fluticasone (FLONASE) 50 MCG/ACT nasal spray Use 2 spray(s) in each nostril once daily  . gemfibrozil (LOPID) 600 MG tablet Take 1 tablet (600 mg total) by mouth 2 (two) times daily.  Marland Kitchen loratadine (CLARITIN) 10 MG tablet Take 1 tablet (10 mg total) by mouth daily.  . Multiple Vitamins-Minerals (CENTRUM SILVER PO) Take 1 capsule by mouth daily.  . pantoprazole (PROTONIX) 40 MG tablet Take 1 tablet (40 mg total) by mouth daily.  . polyethylene glycol (MIRALAX)  packet Take 17 g by mouth daily.    PHQ 2/9 Scores 04/02/2020 01/15/2020 12/25/2019 12/20/2019  PHQ - 2 Score 0 0 0 1  PHQ- 9 Score 0 0 - 2    BP Readings from Last 3 Encounters:  04/02/20 130/60  02/19/20 (!) 115/55  01/15/20 138/70    Physical Exam Vitals and nursing note reviewed.  Constitutional:      General: She is not in acute distress.    Appearance: She is not diaphoretic.  HENT:     Head: Normocephalic and atraumatic.     Jaw: Tenderness present. No trismus, swelling, pain on movement or malocclusion.     Comments: Right tmj tenderness/tmj hypermobility    Right Ear: Tympanic membrane, ear canal and external ear normal. There is no impacted cerumen.     Left Ear: Tympanic membrane, ear canal and external ear normal. There is no impacted cerumen.     Nose: Nose normal. No congestion or rhinorrhea.     Mouth/Throat:     Mouth: Mucous membranes are moist.  Eyes:     General:        Right eye: No discharge.        Left eye: No discharge.     Conjunctiva/sclera: Conjunctivae normal.     Pupils: Pupils are equal, round, and reactive to  light.  Neck:     Thyroid: No thyromegaly.     Vascular: No JVD.  Cardiovascular:     Rate and Rhythm: Normal rate and regular rhythm.     Heart sounds: Normal heart sounds. No murmur. No friction rub. No gallop.   Pulmonary:     Effort: Pulmonary effort is normal.     Breath sounds: Normal breath sounds.  Abdominal:     General: Bowel sounds are normal.     Palpations: Abdomen is soft. There is no mass.     Tenderness: There is no abdominal tenderness. There is no guarding.  Musculoskeletal:        General: Normal range of motion.     Cervical back: Normal range of motion and neck supple.  Lymphadenopathy:     Cervical: No cervical adenopathy.  Skin:    General: Skin is warm and dry.  Neurological:     Mental Status: She is alert.     Deep Tendon Reflexes: Reflexes are normal and symmetric.     Wt Readings from Last 3  Encounters:  04/02/20 193 lb 6.4 oz (87.7 kg)  02/19/20 184 lb (83.5 kg)  01/15/20 183 lb (83 kg)    BP 130/60   Pulse 80   Ht 5\' 3"  (1.6 m)   Wt 193 lb 6.4 oz (87.7 kg)   BMI 34.26 kg/m   Assessment and Plan: 1. Recurrent subluxation of temporomandibular joint, initial encounter New onset.  Recurrent.  Relatively stable.  Not associated with any trauma other than she had a recent dental procedure but that involved removal level plate and not opening of the jaw for extended period of time.  Patient was talking we will noticed that there is a little drop of the jaw and then it returns to normal.  On palpation there is some tenderness of the right temporal mandibular joint and there is a little shimmy on movement forward.  I believe patient is likely subluxing her TMJ and that is returning and patient is concerned about this and would like to have it evaluated by oral surgery.  We will refer to the suggested oral surgeon up with Dr. Pandora Leiter at [facility] for evaluation.  Other things that were considered was the possibility of phonation apraxia but I do not see any other residual concerns of a neurologic deficit on neurologic exam or remainder of her physical exam. - Ambulatory referral to Oral Maxillofacial Surgery

## 2020-04-04 ENCOUNTER — Telehealth: Payer: Self-pay

## 2020-04-04 ENCOUNTER — Telehealth: Payer: Self-pay | Admitting: Family Medicine

## 2020-04-04 NOTE — Telephone Encounter (Signed)
July 15 at 2:45 with Dr Pandora Leiter for jaw- pt called in to tell us the appt has been scheduled

## 2020-04-04 NOTE — Telephone Encounter (Signed)
Copied from [place] [id number]. Topic: General - Other >> Apr 04, 2020 11:09 AM Keene Breath wrote: Reason for CRM: Patient called to let the nurse or doctor know that her appt. At [facility] with Dr. Pandora Leiter has been scheduled for July 15 at 2:45

## 2020-04-07 ENCOUNTER — Other Ambulatory Visit: Payer: Self-pay | Admitting: Internal Medicine

## 2020-04-07 DIAGNOSIS — J449 Chronic obstructive pulmonary disease, unspecified: Secondary | ICD-10-CM

## 2020-04-08 DIAGNOSIS — G4733 Obstructive sleep apnea (adult) (pediatric): Secondary | ICD-10-CM | POA: Diagnosis not present

## 2020-04-10 ENCOUNTER — Other Ambulatory Visit: Payer: Self-pay | Admitting: Family Medicine

## 2020-04-10 DIAGNOSIS — Z1231 Encounter for screening mammogram for malignant neoplasm of breast: Secondary | ICD-10-CM

## 2020-04-17 DIAGNOSIS — G4733 Obstructive sleep apnea (adult) (pediatric): Secondary | ICD-10-CM | POA: Diagnosis not present

## 2020-05-07 DIAGNOSIS — G4733 Obstructive sleep apnea (adult) (pediatric): Secondary | ICD-10-CM | POA: Diagnosis not present

## 2020-05-09 ENCOUNTER — Other Ambulatory Visit: Payer: Self-pay | Admitting: Adult Health

## 2020-05-09 DIAGNOSIS — G4733 Obstructive sleep apnea (adult) (pediatric): Secondary | ICD-10-CM | POA: Diagnosis not present

## 2020-05-09 DIAGNOSIS — J449 Chronic obstructive pulmonary disease, unspecified: Secondary | ICD-10-CM

## 2020-05-13 ENCOUNTER — Other Ambulatory Visit: Payer: Self-pay

## 2020-05-13 ENCOUNTER — Ambulatory Visit
Admission: RE | Admit: 2020-05-13 | Discharge: 2020-05-13 | Disposition: A | Discharge status: Home / Self Care | Payer: Medicare Other | Source: Ambulatory Visit | Attending: Family Medicine | Admitting: Family Medicine

## 2020-05-13 DIAGNOSIS — Z1231 Encounter for screening mammogram for malignant neoplasm of breast: Secondary | ICD-10-CM | POA: Insufficient documentation

## 2020-05-16 DIAGNOSIS — S0301XA Dislocation of jaw, right side, initial encounter: Secondary | ICD-10-CM | POA: Diagnosis not present

## 2020-05-16 DIAGNOSIS — X58XXXA Exposure to other specified factors, initial encounter: Secondary | ICD-10-CM | POA: Diagnosis not present

## 2020-05-29 ENCOUNTER — Other Ambulatory Visit: Payer: Self-pay | Admitting: Internal Medicine

## 2020-05-29 DIAGNOSIS — J309 Allergic rhinitis, unspecified: Secondary | ICD-10-CM

## 2020-06-08 DIAGNOSIS — G4733 Obstructive sleep apnea (adult) (pediatric): Secondary | ICD-10-CM | POA: Diagnosis not present

## 2020-07-09 DIAGNOSIS — G4733 Obstructive sleep apnea (adult) (pediatric): Secondary | ICD-10-CM | POA: Diagnosis not present

## 2020-08-09 DIAGNOSIS — G4733 Obstructive sleep apnea (adult) (pediatric): Secondary | ICD-10-CM | POA: Diagnosis not present

## 2020-08-12 ENCOUNTER — Other Ambulatory Visit: Payer: Self-pay | Admitting: Internal Medicine

## 2020-08-12 DIAGNOSIS — J449 Chronic obstructive pulmonary disease, unspecified: Secondary | ICD-10-CM

## 2020-08-14 ENCOUNTER — Other Ambulatory Visit: Payer: Self-pay

## 2020-08-14 ENCOUNTER — Ambulatory Visit (INDEPENDENT_AMBULATORY_CARE_PROVIDER_SITE_OTHER): Payer: Medicare Other

## 2020-08-14 DIAGNOSIS — G4733 Obstructive sleep apnea (adult) (pediatric): Secondary | ICD-10-CM | POA: Diagnosis not present

## 2020-08-14 NOTE — Progress Notes (Signed)
95 percentile pressure 9.4   95th percentile leak 43.0   apnea index 2.9 /hr  apnea-hypopnea index  3.9 /hr   total days used  >4 hr 90 days  total days used <4 hr 0 days  Total compliance 100 percent  She states starting to feel a little tires in mornings. Spoke to Theodoro Grist DNP agreed to increase pressure to 5-12 cmh2o to make sure she was getting the pressures she needs on auto. Otherwise She is doing great no other problems or questions

## 2020-08-19 ENCOUNTER — Ambulatory Visit: Payer: Medicare Other | Admitting: Internal Medicine

## 2020-08-19 ENCOUNTER — Encounter: Payer: Self-pay | Admitting: Internal Medicine

## 2020-08-19 ENCOUNTER — Other Ambulatory Visit: Payer: Self-pay

## 2020-08-19 VITALS — BP 156/70 | HR 62 | Temp 97.6°F | Resp 16 | Ht 63.0 in | Wt 184.4 lb

## 2020-08-19 DIAGNOSIS — Z23 Encounter for immunization: Secondary | ICD-10-CM

## 2020-08-19 DIAGNOSIS — G4733 Obstructive sleep apnea (adult) (pediatric): Secondary | ICD-10-CM | POA: Diagnosis not present

## 2020-08-19 DIAGNOSIS — J4489 Other specified chronic obstructive pulmonary disease: Secondary | ICD-10-CM

## 2020-08-19 DIAGNOSIS — Z6832 Body mass index (BMI) 32.0-32.9, adult: Secondary | ICD-10-CM | POA: Diagnosis not present

## 2020-08-19 DIAGNOSIS — Z9989 Dependence on other enabling machines and devices: Secondary | ICD-10-CM

## 2020-08-19 DIAGNOSIS — J449 Chronic obstructive pulmonary disease, unspecified: Secondary | ICD-10-CM | POA: Diagnosis not present

## 2020-08-19 MED ORDER — BREO ELLIPTA 100-25 MCG/INH IN AEPB: INHALATION_SPRAY | RESPIRATORY_TRACT | 2 refills | Status: DC

## 2020-08-19 NOTE — Progress Notes (Signed)
[facility] [place],  [id number]  Pulmonary Sleep Medicine   Office Visit Note  Patient Name: Regina Macdonald DOB: Jan 26, 1941 MRN [id number]  Date of Service: 08/19/2020  Complaints/HPI: Pt is seen for pulmonary follow up on osa.  Pt reports good compliance with CPAP therapy. Cleaning machine by hand, and changing filters and tubing as directed. Discussed emptying water chamber and allowing to dry completely daily.  Denies headaches, sinus issues, palpitations, or hemoptysis.    ROS  General: (-) fever, (-) chills, (-) night sweats, (-) weakness Skin: (-) rashes, (-) itching,. Eyes: (-) visual changes, (-) redness, (-) itching. Nose and Sinuses: (-) nasal stuffiness or itchiness, (-) postnasal drip, (-) nosebleeds, (-) sinus trouble. Mouth and Throat: (-) sore throat, (-) hoarseness. Neck: (-) swollen glands, (-) enlarged thyroid, (-) neck pain. Respiratory: - cough, (-) bloody sputum, - shortness of breath, - wheezing. Cardiovascular: - ankle swelling, (-) chest pain. Lymphatic: (-) lymph node enlargement. Neurologic: (-) numbness, (-) tingling. Psychiatric: (-) anxiety, (-) depression   Current Medication: Outpatient Encounter Medications as of 08/19/2020  Medication Sig Note  . acetaminophen (TYLENOL) 500 MG tablet Take 500 mg by mouth every 6 (six) hours as needed.   Marland Kitchen aspirin 81 MG chewable tablet Chew by mouth.   Marland Kitchen BREO ELLIPTA 100-25 MCG/INH AEPB Inhale 1 puff by mouth once daily   . CALCIUM & MAGNESIUM CARBONATES PO Take 1 tablet by mouth daily. 04/09/2015: Received from: [place]:   . Cyanocobalamin (VITAMIN B-12) 1000 MCG SUBL 1 tablet daily. 04/09/2015: Received from: [place]:   . EQUATE STOOL SOFTENER 100 MG capsule Take 1 capsule by mouth twice daily   . FLUoxetine (PROZAC) 10 MG capsule Take 1 capsule (10 mg total) by mouth daily.   Marland Kitchen FLUoxetine (PROZAC) 20 MG  capsule Take 1 capsule (20 mg total) by mouth daily.   . fluticasone (FLONASE) 50 MCG/ACT nasal spray Use 2 spray(s) in each nostril once daily   . gemfibrozil (LOPID) 600 MG tablet Take 1 tablet (600 mg total) by mouth 2 (two) times daily.   Marland Kitchen loratadine (CLARITIN) 10 MG tablet Take 1 tablet (10 mg total) by mouth daily.   . Multiple Vitamins-Minerals (CENTRUM SILVER PO) Take 1 capsule by mouth daily.   . pantoprazole (PROTONIX) 40 MG tablet Take 1 tablet (40 mg total) by mouth daily.   . polyethylene glycol (MIRALAX) packet Take 17 g by mouth daily.    No facility-administered encounter medications on file as of 08/19/2020.    Surgical History: Past Surgical History:  Procedure Laterality Date  . ABDOMINAL HYSTERECTOMY    . COLONOSCOPY  06/08/2013   Dr Candace Cruise- small mouth diverticula  . HIP SURGERY Left   . KNEE SURGERY Bilateral   . NASAL RECONSTRUCTION     x 2    Medical History: Past Medical History:  Diagnosis Date  . Anxiety   . Asthma   . Cancer ([place]) cervical-1973  . COPD (chronic obstructive pulmonary disease) ([place])   . Depression   . Diverticula, colon   . Diverticulosis   . GERD (gastroesophageal reflux disease)   . Sleep apnea     Family History: Family History  Problem Relation Age of Onset  . Cirrhosis Father   . Coronary artery disease Mother   . Breast cancer Neg Hx     Social History: Social History   Socioeconomic History  . Marital status: Divorced  Spouse name: Not on file  . Number of children: 3  . Years of education: Not on file  . Highest education level: High school graduate  Occupational History  . Occupation: retired  Tobacco Use  . Smoking status: Former Research scientist (life sciences)  . Smokeless tobacco: Never Used  Vaping Use  . Vaping Use: Never used  Substance and Sexual Activity  . Alcohol use: No    Alcohol/week: 0.0 standard drinks  . Drug use: No  . Sexual activity: Not Currently    Birth control/protection: Post-menopausal  Other Topics  Concern  . Not on file  Social History Narrative  . Not on file   Social Determinants of Health   Financial Resource Strain:   . Difficulty of Paying Living Expenses: Not on file  Food Insecurity:   . Worried About Charity fundraiser in the Last Year: Not on file  . Ran Out of Food in the Last Year: Not on file  Transportation Needs:   . Lack of Transportation (Medical): Not on file  . Lack of Transportation (Non-Medical): Not on file  Physical Activity:   . Days of Exercise per Week: Not on file  . Minutes of Exercise per Session: Not on file  Stress: No Stress Concern Present  . Feeling of Stress : Only a little  Social Connections:   . Frequency of Communication with Friends and Family: Not on file  . Frequency of Social Gatherings with Friends and Family: Not on file  . Attends Religious Services: Not on file  . Active Member of Clubs or Organizations: Not on file  . Attends Archivist Meetings: Not on file  . Marital Status: Not on file  Intimate Partner Violence:   . Fear of Current or Ex-Partner: Not on file  . Emotionally Abused: Not on file  . Physically Abused: Not on file  . Sexually Abused: Not on file    Vital Signs: Blood pressure (!) 156/70, pulse 62, temperature 97.6 F (36.4 C), resp. rate 16, height 5\' 3"  (1.6 m), weight 184 lb 6.4 oz (83.6 kg), SpO2 95 %.  Examination: General Appearance: The patient is well-developed, well-nourished, and in no distress. Skin: Gross inspection of skin unremarkable. Head: normocephalic, no gross deformities. Eyes: no gross deformities noted. ENT: ears appear grossly normal no exudates. Neck: Supple. No thyromegaly. No LAD. Respiratory: clear bilaterally. Cardiovascular: Normal S1 and S2 without murmur or rub. Extremities: No cyanosis. pulses are equal. Neurologic: Alert and oriented. No involuntary movements.  LABS: No results found for this or any previous visit (from the past 2160  hour(s)).  Radiology: MM 3D SCREEN BREAST BILATERAL  Result Date: 05/15/2020 CLINICAL DATA:  Screening. EXAM: DIGITAL SCREENING BILATERAL MAMMOGRAM WITH TOMO AND CAD COMPARISON:  Previous exam(s). ACR Breast Density Category b: There are scattered areas of fibroglandular density. FINDINGS: There are no findings suspicious for malignancy. Images were processed with CAD. IMPRESSION: No mammographic evidence of malignancy. A result letter of this screening mammogram will be mailed directly to the patient. RECOMMENDATION: Screening mammogram in one year. (Code:SM-B-01Y) BI-RADS CATEGORY  1: Negative. Electronically Signed   By: Audie Pinto M.D.   On: 05/15/2020 08:15    No results found.  No results found.    Assessment and Plan: Patient Active Problem List   Diagnosis Date Noted  . Acute upper respiratory infection 01/10/2020  . Morbid obesity ([place]) 07/31/2019  . Mild episode of recurrent major depressive disorder ([place]) 03/23/2018  . Anxiety 03/23/2018  . Taking  medication for chronic disease 03/23/2018  . Other constipation 12/13/2017  . Depression with anxiety 08/19/2017  . Hyperlipidemia 08/19/2017  . Gastroesophageal reflux disease without esophagitis 08/19/2017  . Other chest pain 04/16/2015  . COPD exacerbation ([place]) 04/16/2015  . Sleep apnea 04/16/2015  . 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 Asthmatic bronchitis 04/16/2015  . Abdominal pain, LLQ 03/29/2014  . Rectal bleeding 03/29/2014  . 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 Osteoarthrosis, unspecified whether generalized or localized, pelvic region and thigh 03/26/2014    1. OSA on CPAP Continue to use cpap as discussed.  Excellent compliance.  Pressures adjusted, will follow up in 6 months.   2. Obstructive chronic bronchitis without exacerbation ([place]) Refilled breo, good control of symptoms.  - fluticasone furoate-vilanterol (BREO ELLIPTA) 100-25 MCG/INH AEPB; Take one inhalation daily.  Dispense: 60 each;  Refill: 2  3. Flu vaccine need - Flu Vaccine MDCK QUAD PF  4. BMI 32.0-32.9,adult Obesity Counseling: Risk Assessment: An assessment of behavioral risk factors was made today and includes lack of exercise sedentary lifestyle, lack of portion control and poor dietary habits.  Risk Modification Advice: She was counseled on portion control guidelines. Restricting daily caloric intake to 1500. The detrimental long term effects of obesity on her health and ongoing poor compliance was also discussed with the patient.    General Counseling: I have discussed the findings of the evaluation and examination with Regina Macdonald.  I have also discussed any further diagnostic evaluation thatmay be needed or ordered today. Regina Macdonald verbalizes understanding of the findings of todays visit. We also reviewed her medications today and discussed drug interactions and side effects including but not limited excessive drowsiness and altered mental states. We also discussed that there is always a risk not just to her but also people around her. she has been encouraged to call the office with any questions or concerns that should arise related to todays visit.  Orders Placed This Encounter  Procedures  . Flu Vaccine MDCK QUAD PF     Time spent: 25 This patient was seen by Orson Gear AGNP-C in Collaboration with Dr. Devona Konig as a part of collaborative care agreement.   I have personally obtained a history, examined the patient, evaluated laboratory and imaging results, formulated the assessment and plan and placed orders.    [name], MD [facility] Pulmonary and Critical Care Sleep medicine

## 2020-08-21 ENCOUNTER — Other Ambulatory Visit: Payer: Self-pay | Admitting: Internal Medicine

## 2020-08-21 ENCOUNTER — Other Ambulatory Visit: Payer: Self-pay | Admitting: Family Medicine

## 2020-08-21 DIAGNOSIS — K219 Gastro-esophageal reflux disease without esophagitis: Secondary | ICD-10-CM

## 2020-08-21 DIAGNOSIS — J309 Allergic rhinitis, unspecified: Secondary | ICD-10-CM

## 2020-08-21 DIAGNOSIS — E785 Hyperlipidemia, unspecified: Secondary | ICD-10-CM

## 2020-08-28 ENCOUNTER — Other Ambulatory Visit: Payer: Self-pay | Admitting: Internal Medicine

## 2020-08-28 DIAGNOSIS — J309 Allergic rhinitis, unspecified: Secondary | ICD-10-CM

## 2020-08-28 NOTE — Telephone Encounter (Signed)
Requested Prescriptions  Pending Prescriptions Disp Refills  . fluticasone (FLONASE) 50 MCG/ACT nasal spray [Pharmacy Med Name: Fluticasone Propionate 50 MCG/ACT Nasal Suspension] 16 g 0    Sig: Use 2 spray(s) in each nostril once daily     Ear, Nose, and Throat: Nasal Preparations - Corticosteroids Passed - 08/28/2020 12:16 PM      Passed - Valid encounter within last 12 months    Recent Outpatient Visits          4 months ago Recurrent subluxation of temporomandibular joint, initial encounter   [facility] Juline Patch, MD   7 months ago Current mild episode of major depressive disorder, unspecified whether recurrent ([place])   [facility] Juline Patch, MD   8 months ago Annual physical exam   [facility] Juline Patch, MD   1 year ago Current mild episode of major depressive disorder, unspecified whether recurrent [facility])   [facility] Juline Patch, MD   1 year ago Seasonal allergic rhinitis due to pollen   [place], MD      Future Appointments            In 1 week Juline Patch, MD [facility], [facility]

## 2020-08-29 ENCOUNTER — Other Ambulatory Visit: Payer: Self-pay

## 2020-08-29 DIAGNOSIS — J449 Chronic obstructive pulmonary disease, unspecified: Secondary | ICD-10-CM

## 2020-09-05 ENCOUNTER — Other Ambulatory Visit: Payer: Self-pay

## 2020-09-05 ENCOUNTER — Encounter: Payer: Self-pay | Admitting: Family Medicine

## 2020-09-05 ENCOUNTER — Ambulatory Visit (INDEPENDENT_AMBULATORY_CARE_PROVIDER_SITE_OTHER): Payer: Medicare Other | Admitting: Family Medicine

## 2020-09-05 DIAGNOSIS — K219 Gastro-esophageal reflux disease without esophagitis: Secondary | ICD-10-CM

## 2020-09-05 DIAGNOSIS — F320 Major depressive disorder, single episode, mild: Secondary | ICD-10-CM | POA: Diagnosis not present

## 2020-09-05 DIAGNOSIS — F419 Anxiety disorder, unspecified: Secondary | ICD-10-CM | POA: Diagnosis not present

## 2020-09-05 DIAGNOSIS — J309 Allergic rhinitis, unspecified: Secondary | ICD-10-CM

## 2020-09-05 DIAGNOSIS — E785 Hyperlipidemia, unspecified: Secondary | ICD-10-CM

## 2020-09-05 MED ORDER — PANTOPRAZOLE SODIUM 40 MG PO TBEC: 40.0000 mg | DELAYED_RELEASE_TABLET | Freq: Every day | ORAL | 1 refills | Status: DC

## 2020-09-05 MED ORDER — GEMFIBROZIL 600 MG PO TABS: 600.0000 mg | ORAL_TABLET | Freq: Two times a day (BID) | ORAL | 1 refills | Status: DC

## 2020-09-05 MED ORDER — FLUOXETINE HCL 20 MG PO CAPS: 20.0000 mg | ORAL_CAPSULE | Freq: Every day | ORAL | 5 refills | Status: DC

## 2020-09-05 MED ORDER — FLUOXETINE HCL 10 MG PO CAPS: 10.0000 mg | ORAL_CAPSULE | Freq: Every day | ORAL | 5 refills | Status: DC

## 2020-09-05 MED ORDER — FLUTICASONE PROPIONATE 50 MCG/ACT NA SUSP: NASAL | 0 refills | Status: DC

## 2020-09-05 NOTE — Progress Notes (Signed)
Date:  09/05/2020   Name:  Regina Macdonald   DOB:  Jan 23, 1941   MRN:  [id number]   Chief Complaint: Gastroesophageal Reflux, Hyperlipidemia, Anxiety (4 and 8), and Allergic Rhinitis   Gastroesophageal Reflux She reports no abdominal pain, no belching, no chest pain, no choking, no coughing, no dysphagia, no early satiety, no globus sensation, no heartburn, no hoarse voice, no nausea, no sore throat, no stridor or no wheezing. This is a new problem. The current episode started more than 1 year ago. The problem has been gradually improving. The symptoms are aggravated by certain foods. Pertinent negatives include no anemia, fatigue, melena, muscle weakness, orthopnea or weight loss. There are no known risk factors. The treatment provided moderate relief.  Hyperlipidemia This is a chronic problem. The current episode started more than 1 year ago. The problem is controlled. Recent lipid tests were reviewed and are normal. She has no history of chronic renal disease, diabetes, hypothyroidism, liver disease, obesity or nephrotic syndrome. Pertinent negatives include no chest pain, focal sensory loss, focal weakness, leg pain, myalgias or shortness of breath. Current antihyperlipidemic treatment includes fibric acid derivatives. The current treatment provides moderate improvement of lipids. There are no compliance problems.   Anxiety Presents for follow-up visit. Patient reports no chest pain, confusion, decreased concentration, depressed mood, dizziness, excessive worry, irritability, nausea, nervous/anxious behavior or shortness of breath.      Lab Results  Component Value Date   CREATININE 0.62 12/20/2019   BUN 11 12/20/2019   NA 140 12/20/2019   K 4.4 12/20/2019   CL 104 12/20/2019   CO2 23 12/20/2019   Lab Results  Component Value Date   CHOL 157 12/20/2019   HDL 48 12/20/2019   LDLCALC 96 12/20/2019   TRIG 66 12/20/2019   CHOLHDL 3.0 08/19/2017   No results found for: TSH Lab  Results  Component Value Date   HGBA1C 5.7 (H) 12/02/2015   Lab Results  Component Value Date   WBC 7.2 12/20/2019   HGB 11.7 12/20/2019   HCT 34.3 12/20/2019   MCV 90 12/20/2019   PLT 316 12/20/2019   Lab Results  Component Value Date   ALT 11 07/31/2019   AST 21 07/31/2019   ALKPHOS 112 07/31/2019   BILITOT 0.3 07/31/2019     Review of Systems  Constitutional: Negative.  Negative for chills, fatigue, fever, irritability, unexpected weight change and weight loss.  HENT: Negative for congestion, ear discharge, ear pain, hoarse voice, rhinorrhea, sinus pressure, sneezing and sore throat.   Eyes: Negative for photophobia, pain, discharge, redness and itching.  Respiratory: Negative for cough, choking, shortness of breath, wheezing and stridor.   Cardiovascular: Negative for chest pain.  Gastrointestinal: Negative for abdominal pain, blood in stool, constipation, diarrhea, dysphagia, heartburn, melena, nausea and vomiting.  Endocrine: Negative for cold intolerance, heat intolerance, polydipsia, polyphagia and polyuria.  Genitourinary: Negative for dysuria, flank pain, frequency, hematuria, menstrual problem, pelvic pain, urgency, vaginal bleeding and vaginal discharge.  Musculoskeletal: Negative for arthralgias, back pain, myalgias and muscle weakness.  Skin: Negative for rash.  Allergic/Immunologic: Negative for environmental allergies and food allergies.  Neurological: Negative for dizziness, focal weakness, weakness, light-headedness, numbness and headaches.  Hematological: Negative for adenopathy. Does not bruise/bleed easily.  Psychiatric/Behavioral: Negative for confusion, decreased concentration and dysphoric mood. The patient is not nervous/anxious.     Patient Active Problem List   Diagnosis Date Noted  . Acute upper respiratory infection 01/10/2020  . Morbid obesity ([employer]) 07/31/2019  .  Mild episode of recurrent major depressive disorder ([place]) 03/23/2018  . Anxiety  03/23/2018  . Taking medication for chronic disease 03/23/2018  . Other constipation 12/13/2017  . Depression with anxiety 08/19/2017  . Hyperlipidemia 08/19/2017  . Gastroesophageal reflux disease without esophagitis 08/19/2017  . Other chest pain 04/16/2015  . COPD exacerbation ([place]) 04/16/2015  . Sleep apnea 04/16/2015  . 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 Asthmatic bronchitis 04/16/2015  . Abdominal pain, LLQ 03/29/2014  . Rectal bleeding 03/29/2014  . 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 Osteoarthrosis, unspecified whether generalized or localized, pelvic region and thigh 03/26/2014    Allergies  Allergen Reactions  . Ciprofloxacin Other (See Comments)  . Etodolac Other (See Comments)  . Sulfa Antibiotics Rash    Past Surgical History:  Procedure Laterality Date  . ABDOMINAL HYSTERECTOMY    . COLONOSCOPY  06/08/2013   Dr Candace Cruise- small mouth diverticula  . HIP SURGERY Left   . KNEE SURGERY Bilateral   . NASAL RECONSTRUCTION     x 2    Social History   Tobacco Use  . Smoking status: Former Research scientist (life sciences)  . Smokeless tobacco: Never Used  Vaping Use  . Vaping Use: Never used  Substance Use Topics  . Alcohol use: No    Alcohol/week: 0.0 standard drinks  . Drug use: No     Medication list has been reviewed and updated.  Current Meds  Medication Sig  . acetaminophen (TYLENOL) 500 MG tablet Take 500 mg by mouth every 6 (six) hours as needed.  Marland Kitchen aspirin 81 MG chewable tablet Chew by mouth.  Marland Kitchen CALCIUM & MAGNESIUM CARBONATES PO Take 1 tablet by mouth daily.  . Cyanocobalamin (VITAMIN B-12) 1000 MCG SUBL 1 tablet daily.  Marland Kitchen EQUATE STOOL SOFTENER 100 MG capsule Take 1 capsule by mouth twice daily  . FLUoxetine (PROZAC) 10 MG capsule Take 1 capsule (10 mg total) by mouth daily.  Marland Kitchen FLUoxetine (PROZAC) 20 MG capsule Take 1 capsule (20 mg total) by mouth daily.  . fluticasone (FLONASE) 50 MCG/ACT nasal spray Use 2 spray(s) in each nostril once daily  . fluticasone  furoate-vilanterol (BREO ELLIPTA) 100-25 MCG/INH AEPB Take one inhalation daily.  Marland Kitchen gemfibrozil (LOPID) 600 MG tablet Take 1 tablet by mouth twice daily  . loratadine (CLARITIN) 10 MG tablet Take 1 tablet (10 mg total) by mouth daily.  . Multiple Vitamins-Minerals (CENTRUM SILVER PO) Take 1 capsule by mouth daily.  . pantoprazole (PROTONIX) 40 MG tablet Take 1 tablet by mouth once daily  . polyethylene glycol (MIRALAX) packet Take 17 g by mouth daily.    PHQ 2/9 Scores 09/05/2020 04/02/2020 01/15/2020 12/25/2019  PHQ - 2 Score 4 0 0 0  PHQ- 9 Score 4 0 0 -    GAD 7 : Generalized Anxiety Score 09/05/2020 04/02/2020 01/15/2020 12/20/2019  Nervous, Anxious, on Edge 0 0 0 0  Control/stop worrying 2 3 0 0  Worry too much - different things 2 3 0 0  Trouble relaxing 0 0 0 0  Restless 0 0 0 0  Easily annoyed or irritable 1 0 0 0  Afraid - awful might happen 3 3 0 0  Total GAD 7 Score 8 9 0 0  Anxiety Difficulty Somewhat difficult Not difficult at all - -    BP Readings from Last 3 Encounters:  09/05/20 120/70  08/19/20 (!) 156/70  04/02/20 130/60    Physical Exam Vitals and nursing note reviewed.  Constitutional:      Appearance: She is  well-developed.  HENT:     Head: Normocephalic.     Right Ear: Tympanic membrane, ear canal and external ear normal.     Left Ear: Tympanic membrane, ear canal and external ear normal.     Nose: Nose normal.     Mouth/Throat:     Mouth: Mucous membranes are moist.  Eyes:     General: Lids are everted, no foreign bodies appreciated. No scleral icterus.       Left eye: No foreign body or hordeolum.     Conjunctiva/sclera: Conjunctivae normal.     Right eye: Right conjunctiva is not injected.     Left eye: Left conjunctiva is not injected.     Pupils: Pupils are equal, round, and reactive to light.  Neck:     Thyroid: No thyromegaly.     Vascular: No JVD.     Trachea: No tracheal deviation.  Cardiovascular:     Rate and Rhythm: Normal rate and  regular rhythm.     Heart sounds: Normal heart sounds. No murmur heard.  No friction rub. No gallop.   Pulmonary:     Effort: Pulmonary effort is normal. No respiratory distress.     Breath sounds: Normal breath sounds. No wheezing or rales.  Abdominal:     General: Bowel sounds are normal.     Palpations: Abdomen is soft. There is no mass.     Tenderness: There is no abdominal tenderness. There is no guarding or rebound.  Musculoskeletal:        General: No tenderness. Normal range of motion.     Cervical back: Normal range of motion and neck supple.  Lymphadenopathy:     Cervical: No cervical adenopathy.  Skin:    General: Skin is warm.     Findings: No rash.  Neurological:     Mental Status: She is alert and oriented to person, place, and time.     Cranial Nerves: No cranial nerve deficit.     Deep Tendon Reflexes: Reflexes normal.  Psychiatric:        Mood and Affect: Mood is not anxious or depressed.     Wt Readings from Last 3 Encounters:  09/05/20 183 lb (83 kg)  08/19/20 184 lb 6.4 oz (83.6 kg)  04/02/20 193 lb 6.4 oz (87.7 kg)    BP 120/70   Pulse 80   Ht 5\' 3"  (1.6 m)   Wt 183 lb (83 kg)   BMI 32.42 kg/m   Assessment and Plan: 1. Current mild episode of major depressive disorder, unspecified whether recurrent (HCC) Chronic.  Controlled.  Stable.  PHQ is 4 with a gad score of 8.  Continue fluoxetine 30 mg once a day. - FLUoxetine (PROZAC) 10 MG capsule; Take 1 capsule (10 mg total) by mouth daily.  Dispense: 30 capsule; Refill: 5 - FLUoxetine (PROZAC) 20 MG capsule; Take 1 capsule (20 mg total) by mouth daily.  Dispense: 30 capsule; Refill: 5  2. Anxiety Chronic.  Controlled.  Stable.  Gad score is noted to be 8.  We will continue on fluoxetine 30 mg once a day. - FLUoxetine (PROZAC) 10 MG capsule; Take 1 capsule (10 mg total) by mouth daily.  Dispense: 30 capsule; Refill: 5 - FLUoxetine (PROZAC) 20 MG capsule; Take 1 capsule (20 mg total) by mouth daily.   Dispense: 30 capsule; Refill: 5  3. Allergic rhinitis Chronic.  Controlled.  Stable.  Continue fluticasone nasal spray 2 puffs in each nostril daily. - fluticasone (FLONASE) 50  MCG/ACT nasal spray; 2 sprays each nostril daily  Dispense: 16 g; Refill: 0  4. Hyperlipidemia, unspecified hyperlipidemia type Chronic.  Controlled.  Stable.  Continue gemfibrozil 600 mg 1 twice a day. - gemfibrozil (LOPID) 600 MG tablet; Take 1 tablet (600 mg total) by mouth 2 (two) times daily.  Dispense: 180 tablet; Refill: 1  5. Gastroesophageal reflux disease without esophagitis Chronic.  Controlled.  Stable.  Continue pantoprazole 40 mg once a day. - pantoprazole (PROTONIX) 40 MG tablet; Take 1 tablet (40 mg total) by mouth daily.  Dispense: 90 tablet; Refill: 1

## 2020-09-06 DIAGNOSIS — G4733 Obstructive sleep apnea (adult) (pediatric): Secondary | ICD-10-CM | POA: Diagnosis not present

## 2020-09-08 DIAGNOSIS — G4733 Obstructive sleep apnea (adult) (pediatric): Secondary | ICD-10-CM | POA: Diagnosis not present

## 2020-09-11 ENCOUNTER — Ambulatory Visit: Payer: Medicare Other | Admitting: Internal Medicine

## 2020-09-25 ENCOUNTER — Other Ambulatory Visit: Payer: Self-pay

## 2020-09-25 ENCOUNTER — Telehealth: Payer: Self-pay

## 2020-09-25 ENCOUNTER — Ambulatory Visit (INDEPENDENT_AMBULATORY_CARE_PROVIDER_SITE_OTHER): Payer: Medicare Other | Admitting: Internal Medicine

## 2020-09-25 DIAGNOSIS — H1131 Conjunctival hemorrhage, right eye: Secondary | ICD-10-CM | POA: Diagnosis not present

## 2020-09-25 DIAGNOSIS — R0602 Shortness of breath: Secondary | ICD-10-CM

## 2020-09-25 DIAGNOSIS — J449 Chronic obstructive pulmonary disease, unspecified: Secondary | ICD-10-CM

## 2020-09-25 LAB — PULMONARY FUNCTION TEST

## 2020-09-25 NOTE — Telephone Encounter (Signed)
Copied from [place] [id number]. Topic: General - Other >> Sep 25, 2020  9:50 AM Lennox Solders wrote: Reason for CRM: Pt would like to talk with dr Ronnald Ramp nurse tara concerning her right eye bloody red this happen last month it went away  with eye drops now its back. Pt does not want to see dr Ronnald Ramp just talk with tara for opthalmologist recommendation

## 2020-09-25 NOTE — Telephone Encounter (Signed)
Gave pt telephone number to [facility]- if needs referral, she will need appt with Ronnald Ramp first

## 2020-10-04 NOTE — Procedures (Signed)
Regina Macdonald, [id number]  DATE OF SERVICE: September 25, 2020  Complete Pulmonary Function Testing Interpretation:  FINDINGS:  Forced vital capacity is mildly decreased.  FEV1 is 1.36 L which is 74% of predicted is mildly decreased.  FEV1 FVC ratio is normal.  Postbronchodilator there is no significant change in the FEV1 clinical improvement may still occur though.  Total lung capacity is moderately decreased residual volume is moderately decreased.  Residual volume total lung capacity ratio is increased.  FRC is decreased.  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  DLCO is preserved clinical correlation recommended  [name], MD [facility] Pulmonary Critical Care Medicine Sleep Medicine

## 2020-10-09 DIAGNOSIS — G4733 Obstructive sleep apnea (adult) (pediatric): Secondary | ICD-10-CM | POA: Diagnosis not present

## 2020-10-16 ENCOUNTER — Other Ambulatory Visit: Payer: Self-pay

## 2020-10-16 ENCOUNTER — Ambulatory Visit (INDEPENDENT_AMBULATORY_CARE_PROVIDER_SITE_OTHER): Payer: Medicare Other

## 2020-10-16 VITALS — BP 144/68 | HR 65 | Temp 98.3°F | Resp 18 | Ht 63.0 in | Wt 180.6 lb

## 2020-10-16 DIAGNOSIS — Z0000 Encounter for general adult medical examination without abnormal findings: Secondary | ICD-10-CM | POA: Diagnosis not present

## 2020-10-16 NOTE — Progress Notes (Signed)
Subjective:   Regina Macdonald is a 79 y.o. female who presents for Medicare Annual (Subsequent) preventive examination.  Review of Systems     Cardiac Risk Factors include: advanced age (>67men, >44 women);dyslipidemia     Objective:    Today's Vitals   10/16/20 1137  BP: (!) 144/68  Pulse: 65  Resp: 18  Temp: 98.3 F (36.8 C)  TempSrc: Oral  SpO2: 94%  Weight: 180 lb 9.6 oz (81.9 kg)  Height: 5\' 3"  (1.6 m)   Body mass index is 31.99 kg/m.  Advanced Directives 10/16/2020 10/16/2019 09/28/2018 08/16/2017 07/20/2017 11/20/2015 04/16/2015  Does Patient Have a Medical Advance Directive? No No No Yes Yes Yes No  Type of Advance Directive - - - Clinical cytogeneticist of [employer] Power of [place];Living will -  Copy of [place] in Chart? - - - No - copy requested Yes No - copy requested -  Would patient like information on creating a medical advance directive? No - Patient declined No - Patient declined Yes (MAU/Ambulatory/Procedural Areas - Information given) - - - No - patient declined information    Current Medications (verified) Outpatient Encounter Medications as of 10/16/2020  Medication Sig  . acetaminophen (TYLENOL) 500 MG tablet Take 500 mg by mouth every 6 (six) hours as needed.  Marland Kitchen aspirin 81 MG chewable tablet Chew by mouth.  Marland Kitchen CALCIUM & MAGNESIUM CARBONATES PO Take 1 tablet by mouth daily.  Marland Kitchen EQUATE STOOL SOFTENER 100 MG capsule Take 1 capsule by mouth twice daily  . FLUoxetine (PROZAC) 10 MG capsule Take 1 capsule (10 mg total) by mouth daily.  Marland Kitchen FLUoxetine (PROZAC) 20 MG capsule Take 1 capsule (20 mg total) by mouth daily.  . fluticasone (FLONASE) 50 MCG/ACT nasal spray 2 sprays each nostril daily  . fluticasone furoate-vilanterol (BREO ELLIPTA) 100-25 MCG/INH AEPB Take one inhalation daily.  Marland Kitchen gemfibrozil (LOPID) 600 MG tablet Take 1 tablet (600 mg total) by mouth 2 (two) times daily.  Marland Kitchen loratadine (CLARITIN) 10  MG tablet Take 1 tablet (10 mg total) by mouth daily.  . Multiple Vitamins-Minerals (CENTRUM SILVER PO) Take 1 capsule by mouth daily.  . pantoprazole (PROTONIX) 40 MG tablet Take 1 tablet (40 mg total) by mouth daily.  . polyethylene glycol (MIRALAX) packet Take 17 g by mouth daily.  . [DISCONTINUED] Cyanocobalamin (VITAMIN B-12) 1000 MCG SUBL 1 tablet daily.   No facility-administered encounter medications on file as of 10/16/2020.    Allergies (verified) Ciprofloxacin, Etodolac, and Sulfa antibiotics   History: Past Medical History:  Diagnosis Date  . Anxiety   . Asthma   . 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 COPD (chronic obstructive pulmonary disease) ([place])   . Depression   . Diverticula, colon   . Diverticulosis   . GERD (gastroesophageal reflux disease)   . Sleep apnea    Past Surgical History:  Procedure Laterality Date  . ABDOMINAL HYSTERECTOMY    . COLONOSCOPY  06/08/2013   Dr Candace Cruise- small mouth diverticula  . HIP SURGERY Left   . KNEE SURGERY Bilateral   . NASAL RECONSTRUCTION     x 2   Family History  Problem Relation Age of Onset  . Cirrhosis Father   . Coronary artery disease Mother   . Breast cancer Neg Hx    Social History   Socioeconomic History  . Marital status: Divorced    Spouse name: Not on file  . Number of children: 3  . Years  of education: Not on file  . Highest education level: High school graduate  Occupational History  . Occupation: retired  Tobacco Use  . Smoking status: Former Research scientist (life sciences)  . Smokeless tobacco: Never Used  Vaping Use  . Vaping Use: Never used  Substance and Sexual Activity  . Alcohol use: No    Alcohol/week: 0.0 standard drinks  . Drug use: No  . Sexual activity: Not Currently    Birth control/protection: Post-menopausal  Other Topics Concern  . Not on file  Social History Narrative   Pt lives with her daughter   Social Determinants of Health   Financial Resource Strain: Low Risk   . Difficulty of Paying Living  Expenses: Not hard at all  Food Insecurity: No Food Insecurity  . Worried About Charity fundraiser in the Last Year: Never true  . Ran Out of Food in the Last Year: Never true  Transportation Needs: No Transportation Needs  . Lack of Transportation (Medical): No  . Lack of Transportation (Non-Medical): No  Physical Activity: Inactive  . Days of Exercise per Week: 0 days  . Minutes of Exercise per Session: 0 min  Stress: No Stress Concern Present  . Feeling of Stress : Only a little  Social Connections: Socially Isolated  . Frequency of Communication with Friends and Family: More than three times a week  . Frequency of Social Gatherings with Friends and Family: More than three times a week  . Attends Religious Services: Never  . Active Member of Clubs or Organizations: No  . Attends Archivist Meetings: Never  . Marital Status: Divorced    Tobacco Counseling Counseling given: Not Answered   Clinical Intake:  Pre-visit preparation completed: Yes  Pain : No/denies pain     BMI - recorded: 31.99 Nutritional Status: BMI > 30  Obese Nutritional Risks: None Diabetes: No  How often do you need to have someone help you when you read instructions, pamphlets, or other written materials from your doctor or pharmacy?: 1 - Never    Interpreter Needed?: No  Information entered by :: Clemetine Marker LPN   Activities of Daily Living In your present state of health, do you have any difficulty performing the following activities: 10/16/2020  Hearing? Y  Comment declines hearing aids  Vision? N  Difficulty concentrating or making decisions? N  Walking or climbing stairs? N  Dressing or bathing? N  Doing errands, shopping? N  Preparing Food and eating ? N  Using the Toilet? N  In the past six months, have you accidently leaked urine? N  Do you have problems with loss of bowel control? N  Managing your Medications? N  Managing your Finances? N  Housekeeping or managing  your Housekeeping? N  Some recent data might be hidden    Patient Care Team: Juline Patch, MD as PCP - General (Family Medicine) [name], MD as Consulting Physician (Internal Medicine)  Indicate any recent Medical Services you may have received from other than [facility] providers in the past year (date may be approximate).     Assessment:   This is a routine wellness examination for [place].  Hearing/Vision screen  Hearing Screening   125Hz  250Hz  500Hz  1000Hz  2000Hz  3000Hz  4000Hz  6000Hz  8000Hz   Right ear:           Left ear:           Comments: Pt c/o mild hearing difficulty; declines hearing aids   Vision Screening Comments: Annual vision screenings  at [facility] in [place]  Dietary issues and exercise activities discussed: Current Exercise Habits: The patient does not participate in regular exercise at present, Exercise limited by: respiratory conditions(s)  Goals    . DIET - INCREASE WATER INTAKE     Recommend drinking 6-8 glasses of water per day    . Prevent Falls     Fall prevention discussed      Depression Screen PHQ 2/9 Scores 10/16/2020 09/05/2020 04/02/2020 01/15/2020 12/25/2019 12/20/2019 10/16/2019  PHQ - 2 Score 3 4 0 0 0 1 0  PHQ- 9 Score 3 4 0 0 - 2 -    Fall Risk Fall Risk  10/16/2020 09/05/2020 04/02/2020 12/25/2019 12/20/2019  Falls in the past year? 1 1 1  0 0  Number falls in past yr: 0 0 1 - -  Injury with Fall? 0 0 0 - -  Risk for fall due to : History of fall(s) - History of fall(s);Impaired balance/gait - -  Follow up Falls prevention discussed Falls evaluation completed Falls evaluation completed - Falls evaluation completed    Any stairs in or around the home? No  If so, are there any without handrails? No  Home free of loose throw rugs in walkways, pet beds, electrical cords, etc? Yes  Adequate lighting in your home to reduce risk of falls? Yes   ASSISTIVE DEVICES UTILIZED TO PREVENT FALLS:  Life alert? No  Use of a cane, walker or w/c?  No  Grab bars in the bathroom? No  Shower chair or bench in shower? No  Elevated toilet seat or a handicapped toilet? No   TIMED UP AND GO:  Was the test performed? Yes .  Length of time to ambulate 10 feet: 5 sec.   Gait steady and fast without use of assistive device  Cognitive Function:     6CIT Screen 10/16/2020 10/16/2019 09/28/2018 08/16/2017  What Year? 0 points 0 points 0 points 0 points  What month? 0 points 0 points 0 points 0 points  What time? 0 points 0 points 0 points 0 points  Count back from 20 0 points 0 points 0 points 0 points  Months in reverse 0 points 0 points 0 points 0 points  Repeat phrase 0 points 0 points 2 points 2 points  Total Score 0 0 2 2    Immunizations Immunization History  Administered Date(s) Administered  . Fluad Quad(high Dose 65+) 07/31/2019  . Influenza Inj Mdck Quad Pf 08/19/2020  . Influenza, High Dose Seasonal PF 08/16/2017, 08/07/2018  . Influenza,inj,Quad PF,6+ Mos 09/09/2015  . Influenza-Unspecified 07/24/2014, 08/07/2018, 08/26/2020  . PFIZER SARS-COV-2 Vaccination 01/17/2020, 02/07/2020, 10/11/2020  . Pneumococcal Conjugate-13 09/09/2015  . Pneumococcal Polysaccharide-23 02/09/2013  . Tdap 08/10/2011  . Zoster 02/27/2015    TDAP status: Up to date   Flu Vaccine status: Up to date   Pneumococcal vaccine status: Up to date   Covid-19 vaccine status: Completed vaccines  Qualifies for Shingles Vaccine? Yes   Zostavax completed Yes   Shingrix Completed?: No.    Education has been provided regarding the importance of this vaccine. Patient has been advised to call insurance company to determine out of pocket expense if they have not yet received this vaccine. Advised may also receive vaccine at local pharmacy or Health Dept. Verbalized acceptance and understanding.  Screening Tests Health Maintenance  Topic Date Due  . TETANUS/TDAP  08/09/2021  . INFLUENZA VACCINE  Completed  . DEXA SCAN  Completed  . COVID-19 Vaccine  Completed  . PNA vac Low Risk Adult  Completed    Health Maintenance  There are no preventive care reminders to display for this patient.  Colorectal cancer screening: No longer required.    Mammogram status: Completed 05/13/20. Repeat every year   Bone Density status: Completed 03/19/15. Results reflect: Bone density results: OSTEOPENIA. Repeat every 2 years. pt declines repeat screening at this time.   Lung Cancer Screening: (Low Dose CT Chest recommended if Age 30-80 years, 30 pack-year currently smoking OR have quit w/in 15years.) does not qualify.   Additional Screening:  Hepatitis C Screening: does not qualify  Vision Screening: Recommended annual ophthalmology exams for early detection of glaucoma and other disorders of the eye. Is the patient up to date with their annual eye exam?  Yes  Who is the provider or what is the name of the office in which the patient attends annual eye exams? [facility] [place]  Dental Screening: Recommended annual dental exams for proper oral hygiene  Community Resource Referral / Chronic Care Management: CRR required this visit?  No   CCM required this visit?  No      Plan:     I have personally reviewed and noted the following in the patient's chart:   . Medical and social history . Use of alcohol, tobacco or illicit drugs  . Current medications and supplements . Functional ability and status . Nutritional status . Physical activity . Advanced directives . List of other physicians . Hospitalizations, surgeries, and ER visits in previous 12 months . Vitals . Screenings to include cognitive, depression, and falls . Referrals and appointments  In addition, I have reviewed and discussed with patient certain preventive protocols, quality metrics, and best practice recommendations. A written personalized care plan for preventive services as well as general preventive health recommendations were provided to patient.     Clemetine Marker,  LPN   [id number]   Nurse Notes: none

## 2020-10-16 NOTE — Patient Instructions (Signed)
Regina Macdonald , Thank you for taking time to come for your Medicare Wellness Visit. I appreciate your ongoing commitment to your health goals. Please review the following plan we discussed and let me know if I can assist you in the future.   Screening recommendations/referrals: Colonoscopy: no longer required Mammogram: done 05/13/20 Bone Density: done 03/19/15 Recommended yearly ophthalmology/optometry visit for glaucoma screening and checkup Recommended yearly dental visit for hygiene and checkup  Vaccinations: Influenza vaccine: done 08/26/20 Pneumococcal vaccine: done 09/09/15 Tdap vaccine: done 08/10/11 Shingles vaccine: Shingrix discussed. Please contact your pharmacy for coverage information.    Covid-19: done 01/17/20, 02/07/20 & 10/11/20  Advanced directives: Advance directive discussed with you today. Even though you declined this today please call our office should you change your mind and we can give you the proper paperwork for you to fill out.  Conditions/risks identified: Recommend increasing physical activity as tolerated  Next appointment: Follow up in one year for your annual wellness visit    Preventive Care 65 Years and Older, Female Preventive care refers to lifestyle choices and visits with your health care provider that can promote health and wellness. What does preventive care include?  A yearly physical exam. This is also called an annual well check.  Dental exams once or twice a year.  Routine eye exams. Ask your health care provider how often you should have your eyes checked.  Personal lifestyle choices, including:  Daily care of your teeth and gums.  Regular physical activity.  Eating a healthy diet.  Avoiding tobacco and drug use.  Limiting alcohol use.  Practicing safe sex.  Taking low-dose aspirin every day.  Taking vitamin and mineral supplements as recommended by your health care provider. What happens during an annual well check? The  services and screenings done by your health care provider during your annual well check will depend on your age, overall health, lifestyle risk factors, and family history of disease. Counseling  Your health care provider may ask you questions about your:  Alcohol use.  Tobacco use.  Drug use.  Emotional well-being.  Home and relationship well-being.  Sexual activity.  Eating habits.  History of falls.  Memory and ability to understand (cognition).  Work and work Statistician.  Reproductive health. Screening  You may have the following tests or measurements:  Height, weight, and BMI.  Blood pressure.  Lipid and cholesterol levels. These may be checked every 5 years, or more frequently if you are over 57 years old.  Skin check.  Lung cancer screening. You may have this screening every year starting at age 2 if you have a 30-pack-year history of smoking and currently smoke or have quit within the past 15 years.  Fecal occult blood test (FOBT) of the stool. You may have this test every year starting at age 52.  Flexible sigmoidoscopy or colonoscopy. You may have a sigmoidoscopy every 5 years or a colonoscopy every 10 years starting at age 66.  Hepatitis C blood test.  Hepatitis B blood test.  Sexually transmitted disease (STD) testing.  Diabetes screening. This is done by checking your blood sugar (glucose) after you have not eaten for a while (fasting). You may have this done every 1-3 years.  Bone density scan. This is done to screen for osteoporosis. You may have this done starting at age 21.  Mammogram. This may be done every 1-2 years. Talk to your health care provider about how often you should have regular mammograms. Talk with your health care  provider about your test results, treatment options, and if necessary, the need for more tests. Vaccines  Your health care provider may recommend certain vaccines, such as:  Influenza vaccine. This is recommended  every year.  Tetanus, diphtheria, and acellular pertussis (Tdap, Td) vaccine. You may need a Td booster every 10 years.  Zoster vaccine. You may need this after age 49.  Pneumococcal 13-valent conjugate (PCV13) vaccine. One dose is recommended after age 61.  Pneumococcal polysaccharide (PPSV23) vaccine. One dose is recommended after age 68. Talk to your health care provider about which screenings and vaccines you need and how often you need them. This information is not intended to replace advice given to you by your health care provider. Make sure you discuss any questions you have with your health care provider. Document Released: 11/29/2015 Document Revised: 07/22/2016 Document Reviewed: 09/03/2015 Elsevier Interactive Patient Education  2017 [employer] Prevention in the Home Falls can cause injuries. They can happen to people of all ages. There are many things you can do to make your home safe and to help prevent falls. What can I do on the outside of my home?  Regularly fix the edges of walkways and driveways and fix any cracks.  Remove anything that might make you trip as you walk through a door, such as a raised step or threshold.  Trim any bushes or trees on the path to your home.  Use bright outdoor lighting.  Clear any walking paths of anything that might make someone trip, such as rocks or tools.  Regularly check to see if handrails are loose or broken. Make sure that both sides of any steps have handrails.  Any raised decks and porches should have guardrails on the edges.  Have any leaves, snow, or ice cleared regularly.  Use sand or salt on walking paths during winter.  Clean up any spills in your garage right away. This includes oil or grease spills. What can I do in the bathroom?  Use night lights.  Install grab bars by the toilet and in the tub and shower. Do not use towel bars as grab bars.  Use non-skid mats or decals in the tub or shower.  If  you need to sit down in the shower, use a plastic, non-slip stool.  Keep the floor dry. Clean up any water that spills on the floor as soon as it happens.  Remove soap buildup in the tub or shower regularly.  Attach bath mats securely with double-sided non-slip rug tape.  Do not have throw rugs and other things on the floor that can make you trip. What can I do in the bedroom?  Use night lights.  Make sure that you have a light by your bed that is easy to reach.  Do not use any sheets or blankets that are too big for your bed. They should not hang down onto the floor.  Have a firm chair that has side arms. You can use this for support while you get dressed.  Do not have throw rugs and other things on the floor that can make you trip. What can I do in the kitchen?  Clean up any spills right away.  Avoid walking on wet floors.  Keep items that you use a lot in easy-to-reach places.  If you need to reach something above you, use a strong step stool that has a grab bar.  Keep electrical cords out of the way.  Do not use floor polish  or wax that makes floors slippery. If you must use wax, use non-skid floor wax.  Do not have throw rugs and other things on the floor that can make you trip. What can I do with my stairs?  Do not leave any items on the stairs.  Make sure that there are handrails on both sides of the stairs and use them. Fix handrails that are broken or loose. Make sure that handrails are as long as the stairways.  Check any carpeting to make sure that it is firmly attached to the stairs. Fix any carpet that is loose or worn.  Avoid having throw rugs at the top or bottom of the stairs. If you do have throw rugs, attach them to the floor with carpet tape.  Make sure that you have a light switch at the top of the stairs and the bottom of the stairs. If you do not have them, ask someone to add them for you. What else can I do to help prevent falls?  Wear shoes  that:  Do not have high heels.  Have rubber bottoms.  Are comfortable and fit you well.  Are closed at the toe. Do not wear sandals.  If you use a stepladder:  Make sure that it is fully opened. Do not climb a closed stepladder.  Make sure that both sides of the stepladder are locked into place.  Ask someone to hold it for you, if possible.  Clearly mark and make sure that you can see:  Any grab bars or handrails.  First and last steps.  Where the edge of each step is.  Use tools that help you move around (mobility aids) if they are needed. These include:  Canes.  Walkers.  Scooters.  Crutches.  Turn on the lights when you go into a dark area. Replace any light bulbs as soon as they burn out.  Set up your furniture so you have a clear path. Avoid moving your furniture around.  If any of your floors are uneven, fix them.  If there are any pets around you, be aware of where they are.  Review your medicines with your doctor. Some medicines can make you feel dizzy. This can increase your chance of falling. Ask your doctor what other things that you can do to help prevent falls. This information is not intended to replace advice given to you by your health care provider. Make sure you discuss any questions you have with your health care provider. Document Released: 08/29/2009 Document Revised: 04/09/2016 Document Reviewed: 12/07/2014 Elsevier Interactive Patient Education  2017 [employer].

## 2020-11-05 ENCOUNTER — Ambulatory Visit: Payer: Self-pay | Admitting: *Deleted

## 2020-11-05 NOTE — Telephone Encounter (Signed)
Could not leave message with patient.

## 2020-11-05 NOTE — Telephone Encounter (Signed)
Please call and tell her she may pick up zyrtec in place of loratadine

## 2020-11-05 NOTE — Telephone Encounter (Signed)
Pt states her ears are itchty and dry. She wants to know what you would recommend. Discuss bilateral ear itching and dryness and the possible causes: eczema, psoriasis and dermatitis. Without knowing cause- treatment may be difficult appointment scheduled for evaluation.  Reason for Disposition  [1] Cause unknown AND [2] present > 7 days  Answer Assessment - Initial Assessment Questions 1. DESCRIPTION: "Describe the itching you are having." "Where is it located?"     Both ears 2. SEVERITY: "How bad is it?"    - MILD - doesn't interfere with normal activities   - MODERATE-SEVERE: interferes with work, school, sleep, or other activities      Mild/moderate 3. SCRATCHING: "Are there any scratch marks? Bleeding?"     no 4. ONSET: "When did the itching begin?"      1-2 weeks 5. CAUSE: "What do you think is causing the itching?"      Dry skin 6. OTHER SYMPTOMS: "Do you have any other symptoms?"      No other symptoms 7. PREGNANCY: "Is there any chance you are pregnant?" "When was your last menstrual period?"     n/a  Protocols used: ITCHING - LOCALIZED-A-AH

## 2020-11-06 ENCOUNTER — Other Ambulatory Visit: Payer: Self-pay

## 2020-11-06 ENCOUNTER — Encounter: Payer: Self-pay | Admitting: Family Medicine

## 2020-11-06 ENCOUNTER — Ambulatory Visit (INDEPENDENT_AMBULATORY_CARE_PROVIDER_SITE_OTHER): Payer: Medicare Other | Admitting: Family Medicine

## 2020-11-06 VITALS — BP 142/80 | HR 86 | Temp 98.1°F | Ht 63.0 in | Wt 184.0 lb

## 2020-11-06 DIAGNOSIS — H608X3 Other otitis externa, bilateral: Secondary | ICD-10-CM | POA: Diagnosis not present

## 2020-11-06 MED ORDER — NEOMYCIN-POLYMYXIN-HC 3.5-10000-1 OT SUSP: 4.0000 [drp] | Freq: Four times a day (QID) | OTIC | 0 refills | Status: DC

## 2020-11-06 NOTE — Progress Notes (Signed)
Date:  11/06/2020   Name:  Regina Macdonald   DOB:  [birth year]   MRN:  [id number]   Chief Complaint: Ear Problem (Patient complaining of dry itching ears. Started 2 weeks ago. No pain. Using over the counter Ear relief drops- mild relief.)  Otalgia  There is pain in both ears. This is a chronic problem. The current episode started 1 to 4 weeks ago. The problem occurs constantly. The problem has been gradually worsening. There has been no fever. Pertinent negatives include no abdominal pain, coughing, diarrhea, ear discharge, headaches, hearing loss, neck pain, rash, rhinorrhea, sore throat or vomiting. She has tried ear drops for the symptoms. The treatment provided no relief.    Lab Results  Component Value Date   CREATININE 0.62 12/20/2019   BUN 11 12/20/2019   NA 140 12/20/2019   K 4.4 12/20/2019   CL 104 12/20/2019   CO2 23 12/20/2019   Lab Results  Component Value Date   CHOL 157 12/20/2019   HDL 48 12/20/2019   LDLCALC 96 12/20/2019   TRIG 66 12/20/2019   CHOLHDL 3.0 08/19/2017   No results found for: TSH Lab Results  Component Value Date   HGBA1C 5.7 (H) 12/02/2015   Lab Results  Component Value Date   WBC 7.2 12/20/2019   HGB 11.7 12/20/2019   HCT 34.3 12/20/2019   MCV 90 12/20/2019   PLT 316 12/20/2019   Lab Results  Component Value Date   ALT 11 07/31/2019   AST 21 07/31/2019   ALKPHOS 112 07/31/2019   BILITOT 0.3 07/31/2019     Review of Systems  Constitutional: Negative.  Negative for chills, fatigue, fever and unexpected weight change.  HENT: Positive for ear pain. Negative for congestion, ear discharge, hearing loss, rhinorrhea, sinus pressure, sneezing and sore throat.   Eyes: Negative for double vision, photophobia, pain, discharge, redness and itching.  Respiratory: Negative for cough, shortness of breath, wheezing and stridor.   Gastrointestinal: Negative for abdominal pain, blood in stool, constipation, diarrhea, nausea and vomiting.   Endocrine: Negative for cold intolerance, heat intolerance, polydipsia, polyphagia and polyuria.  Genitourinary: Negative for dysuria, flank pain, frequency, hematuria, menstrual problem, pelvic pain, urgency, vaginal bleeding and vaginal discharge.  Musculoskeletal: Negative for arthralgias, back pain, myalgias and neck pain.  Skin: Negative for rash.  Allergic/Immunologic: Negative for environmental allergies and food allergies.  Neurological: Negative for dizziness, weakness, light-headedness, numbness and headaches.  Hematological: Negative for adenopathy. Does not bruise/bleed easily.  Psychiatric/Behavioral: Negative for dysphoric mood. The patient is not nervous/anxious.     Patient Active Problem List   Diagnosis Date Noted  . Acute upper respiratory infection 01/10/2020  . Morbid obesity ([place]) 07/31/2019  . 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 Anxiety 03/23/2018  . Taking medication for chronic disease 03/23/2018  . Other constipation 12/13/2017  . Depression with anxiety 08/19/2017  . Hyperlipidemia 08/19/2017  . Gastroesophageal reflux disease without esophagitis 08/19/2017  . Other chest pain 04/16/2015  . COPD exacerbation ([place]) 04/16/2015  . Sleep apnea 04/16/2015  . 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 Asthmatic bronchitis 04/16/2015  . Abdominal pain, LLQ 03/29/2014  . Rectal bleeding 03/29/2014  . 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 Osteoarthrosis, unspecified whether generalized or localized, pelvic region and thigh 03/26/2014    Allergies  Allergen Reactions  . Ciprofloxacin Other (See Comments)  . Etodolac Other (See Comments)  . Sulfa Antibiotics Rash    Past Surgical History:  Procedure  Laterality Date  . ABDOMINAL HYSTERECTOMY    . COLONOSCOPY  06/08/2013   Dr Candace Cruise- small mouth diverticula  . HIP SURGERY Left   . KNEE SURGERY Bilateral   . NASAL RECONSTRUCTION     x 2    Social History   Tobacco Use  .  Smoking status: Former Research scientist (life sciences)  . Smokeless tobacco: Never Used  Vaping Use  . Vaping Use: Never used  Substance Use Topics  . Alcohol use: No    Alcohol/week: 0.0 standard drinks  . Drug use: No     Medication list has been reviewed and updated.  Current Meds  Medication Sig  . acetaminophen (TYLENOL) 500 MG tablet Take 500 mg by mouth every 6 (six) hours as needed.  Marland Kitchen aspirin 81 MG chewable tablet Chew by mouth.  Marland Kitchen CALCIUM & MAGNESIUM CARBONATES PO Take 1 tablet by mouth daily.  Marland Kitchen EQUATE STOOL SOFTENER 100 MG capsule Take 1 capsule by mouth twice daily  . FLUoxetine (PROZAC) 10 MG capsule Take 1 capsule (10 mg total) by mouth daily.  Marland Kitchen FLUoxetine (PROZAC) 20 MG capsule Take 1 capsule (20 mg total) by mouth daily.  . fluticasone (FLONASE) 50 MCG/ACT nasal spray 2 sprays each nostril daily  . fluticasone furoate-vilanterol (BREO ELLIPTA) 100-25 MCG/INH AEPB Take one inhalation daily.  Marland Kitchen gemfibrozil (LOPID) 600 MG tablet Take 1 tablet (600 mg total) by mouth 2 (two) times daily.  Marland Kitchen loratadine (CLARITIN) 10 MG tablet Take 1 tablet (10 mg total) by mouth daily.  . Multiple Vitamins-Minerals (CENTRUM SILVER PO) Take 1 capsule by mouth daily.  . pantoprazole (PROTONIX) 40 MG tablet Take 1 tablet (40 mg total) by mouth daily.  . polyethylene glycol (MIRALAX) packet Take 17 g by mouth daily.    PHQ 2/9 Scores 11/06/2020 10/16/2020 09/05/2020 04/02/2020  PHQ - 2 Score 3 3 4  0  PHQ- 9 Score 3 3 4  0    GAD 7 : Generalized Anxiety Score 11/06/2020 09/05/2020 04/02/2020 01/15/2020  Nervous, Anxious, on Edge 0 0 0 0  Control/stop worrying 2 2 3  0  Worry too much - different things 2 2 3  0  Trouble relaxing 0 0 0 0  Restless 0 0 0 0  Easily annoyed or irritable 1 1 0 0  Afraid - awful might happen 0 3 3 0  Total GAD 7 Score 5 8 9  0  Anxiety Difficulty Not difficult at all Somewhat difficult Not difficult at all -    BP Readings from Last 3 Encounters:  11/06/20 (!) 142/80  10/16/20 (!)  144/68  09/05/20 120/70    Physical Exam Vitals and nursing note reviewed.  Constitutional:      Appearance: She is well-developed and well-nourished.  HENT:     Head: Normocephalic.     Right Ear: Tympanic membrane and external ear normal. There is no impacted cerumen.     Left Ear: Tympanic membrane and external ear normal. There is no impacted cerumen.     Nose: Nose normal.     Mouth/Throat:     Mouth: Oropharynx is clear and moist. Mucous membranes are moist.  Eyes:     General: Lids are everted, no foreign bodies appreciated. No scleral icterus.       Left eye: No foreign body or hordeolum.     Extraocular Movements: EOM normal.     Conjunctiva/sclera: Conjunctivae normal.     Right eye: Right conjunctiva is not injected.     Left eye: Left conjunctiva is  not injected.     Pupils: Pupils are equal, round, and reactive to light.  Neck:     Thyroid: No thyromegaly.     Vascular: No JVD.     Trachea: No tracheal deviation.  Cardiovascular:     Rate and Rhythm: Normal rate and regular rhythm.     Pulses: Intact distal pulses.     Heart sounds: Normal heart sounds. No murmur heard. No friction rub. No gallop.   Pulmonary:     Effort: Pulmonary effort is normal. No respiratory distress.     Breath sounds: Normal breath sounds. No wheezing, rhonchi or rales.  Abdominal:     General: Bowel sounds are normal.     Palpations: Abdomen is soft. There is no hepatosplenomegaly or mass.     Tenderness: There is no abdominal tenderness. There is no guarding or rebound.  Musculoskeletal:        General: No tenderness or edema. Normal range of motion.     Cervical back: Normal range of motion and neck supple.  Lymphadenopathy:     Cervical: No cervical adenopathy.  Skin:    General: Skin is warm.     Findings: No rash.  Neurological:     Mental Status: She is alert and oriented to person, place, and time.     Cranial Nerves: No cranial nerve deficit.     Deep Tendon Reflexes:  Strength normal. Reflexes normal.  Psychiatric:        Mood and Affect: Mood and affect normal. Mood is not anxious or depressed.     Wt Readings from Last 3 Encounters:  11/06/20 184 lb (83.5 kg)  10/16/20 180 lb 9.6 oz (81.9 kg)  09/05/20 183 lb (83 kg)    BP (!) 142/80   Pulse 86   Temp 98.1 F (36.7 C) (Oral)   Ht 5\' 3"  (1.6 m)   Wt 184 lb (83.5 kg)   SpO2 96%   BMI 32.59 kg/m   Assessment and Plan:  1. Chronic eczematous otitis externa of both ears  New onset.  Persistent.  Stable.  Patient has been scratching to the ears because of the itchiness.  There is minimal cerumen noted on evaluation.  There is mild erythema.  We will use neomycin with corticosteroid drops for the ear from both the irritation and the pruritus.

## 2020-11-06 NOTE — Patient Instructions (Signed)
Ear Drops, Adult You have been diagnosed with a condition that requires you to put drops of medicine into your ears. Ear drops are a medicine that is placed in the ear. This sheet gives you information about how to use ear drops. Your health care provider may also give you more specific instructions. Supplies needed:  Cotton ball.  Medicine. How to put ear drops into your ear  1. Wash your hands thoroughly with soap and water. 2. Make sure your ears are clean and dry. If there is any ear wax or drainage at the outermost portion of the ear canal, wipe it out gently with a cotton-tipped applicator. 3. Warm up the medicine by holding it in the palm of your hand for a few minutes. 4. Shake the medicine if it is a suspension. 5. Use the dropper to draw up the medicine. 6. Hold the dropper above your ear canal and put the drops in the affected ear as instructed. Do not put the dropper into your ear at any time. It may help to pull the outer flap of the ear up and back while you put the drops in. Doing this will straighten out the ear canal so the medicine can get into the canal easier. 7. To make sure your ear soaks up the medicine, do either of these things: ? Lie down with the affected ear facing up for 10 minutes. This will cause the drops to stay in the ear canal and run down and fill the canal. ? Gently put a cotton ball in your ear canal. Leave enough of the cotton ball out so it can be easily removed. Do not push the cotton ball down into your ear with a cotton-tipped swab or other instrument. You can remove the cotton ball once the medicine has been absorbed. 8. If both ears need the drops, repeat the procedure for the other ear. Your health care provider will let you know if you need to put drops in both ears. Follow these instructions at home:  Use the ear drops for as long as directed by your health care provider, even if you begin to feel better.  Always wash your hands before and after  handling the ear drops.  Keep the ear drops at room temperature.  Keep all follow-up visits as told by your health care provider. This is important. Contact a health care provider if:  Your condition gets worse.  Your pain gets worse.  You notice any unusual drainage from your ear, especially if the drainage has a bad smell.  You have trouble hearing.  You have used the ear drops for the amount of time recommended by your health care provider, but your symptoms have not improved. Get help right away if:  You experience a form of dizziness in which you feel as if the room is spinning and you feel nauseated (vertigo).  The outside of your ear becomes red or swollen.  You develop a severe headache with or without neck stiffness. Summary  Ear drops are a medicine that is placed in the ear.  Put drops in the affected ear as instructed.  Use the ear drops for as long as directed by your health care provider, even if your symptoms begin to get better.  Keep all follow-up visits as told by your health care provider. This is important. This information is not intended to replace advice given to you by your health care provider. Make sure you discuss any questions you have with  your health care provider. Document Revised: 10/15/2017 Document Reviewed: 11/05/2016 Elsevier Patient Education  2020 [employer].

## 2020-11-08 DIAGNOSIS — G4733 Obstructive sleep apnea (adult) (pediatric): Secondary | ICD-10-CM | POA: Diagnosis not present

## 2020-12-05 ENCOUNTER — Other Ambulatory Visit: Payer: Self-pay | Admitting: Adult Health

## 2020-12-05 DIAGNOSIS — J449 Chronic obstructive pulmonary disease, unspecified: Secondary | ICD-10-CM

## 2020-12-09 ENCOUNTER — Other Ambulatory Visit: Payer: Self-pay

## 2020-12-09 DIAGNOSIS — G4733 Obstructive sleep apnea (adult) (pediatric): Secondary | ICD-10-CM | POA: Diagnosis not present

## 2020-12-09 DIAGNOSIS — J449 Chronic obstructive pulmonary disease, unspecified: Secondary | ICD-10-CM

## 2020-12-09 MED ORDER — BREO ELLIPTA 100-25 MCG/INH IN AEPB: INHALATION_SPRAY | RESPIRATORY_TRACT | 2 refills | Status: DC

## 2020-12-11 DIAGNOSIS — G4733 Obstructive sleep apnea (adult) (pediatric): Secondary | ICD-10-CM | POA: Diagnosis not present

## 2021-01-09 DIAGNOSIS — G4733 Obstructive sleep apnea (adult) (pediatric): Secondary | ICD-10-CM | POA: Diagnosis not present

## 2021-01-24 LAB — HEPATIC FUNCTION PANEL
ALT: 23 (ref 7–35)
AST: 14 (ref 13–35)

## 2021-01-24 LAB — COMPREHENSIVE METABOLIC PANEL: GFR calc non Af Amer: 60

## 2021-01-24 LAB — BASIC METABOLIC PANEL: Creatinine: 0.9 (ref 0.5–1.1)

## 2021-01-24 LAB — TSH: TSH: 3.16 (ref 0.41–5.90)

## 2021-02-06 DIAGNOSIS — G4733 Obstructive sleep apnea (adult) (pediatric): Secondary | ICD-10-CM | POA: Diagnosis not present

## 2021-02-12 ENCOUNTER — Other Ambulatory Visit: Payer: Self-pay

## 2021-02-12 ENCOUNTER — Ambulatory Visit (INDEPENDENT_AMBULATORY_CARE_PROVIDER_SITE_OTHER): Payer: Medicare Other

## 2021-02-12 DIAGNOSIS — G4733 Obstructive sleep apnea (adult) (pediatric): Secondary | ICD-10-CM | POA: Diagnosis not present

## 2021-02-12 NOTE — Progress Notes (Signed)
95 percentile pressure 11.3   95th percentile leak 43.9   apnea index 3.8 /hr  apnea-hypopnea index  4.8 /hr   total days used  >4 hr 84 days  total days used <4 hr 6 days  Total compliance 93 percent  She is doing great no problems or questions at this time

## 2021-02-17 ENCOUNTER — Ambulatory Visit: Payer: Medicare Other | Admitting: Internal Medicine

## 2021-02-19 ENCOUNTER — Ambulatory Visit: Payer: Medicare Other | Admitting: Internal Medicine

## 2021-03-06 ENCOUNTER — Ambulatory Visit (INDEPENDENT_AMBULATORY_CARE_PROVIDER_SITE_OTHER): Payer: Medicare Other | Admitting: Family Medicine

## 2021-03-06 ENCOUNTER — Other Ambulatory Visit: Payer: Self-pay

## 2021-03-06 ENCOUNTER — Encounter: Payer: Self-pay | Admitting: Family Medicine

## 2021-03-06 ENCOUNTER — Other Ambulatory Visit: Payer: Self-pay | Admitting: Internal Medicine

## 2021-03-06 VITALS — BP 130/70 | HR 80 | Ht 63.0 in | Wt 184.0 lb

## 2021-03-06 DIAGNOSIS — F419 Anxiety disorder, unspecified: Secondary | ICD-10-CM | POA: Diagnosis not present

## 2021-03-06 DIAGNOSIS — J301 Allergic rhinitis due to pollen: Secondary | ICD-10-CM | POA: Diagnosis not present

## 2021-03-06 DIAGNOSIS — J449 Chronic obstructive pulmonary disease, unspecified: Secondary | ICD-10-CM

## 2021-03-06 DIAGNOSIS — E785 Hyperlipidemia, unspecified: Secondary | ICD-10-CM

## 2021-03-06 DIAGNOSIS — F320 Major depressive disorder, single episode, mild: Secondary | ICD-10-CM | POA: Diagnosis not present

## 2021-03-06 DIAGNOSIS — K219 Gastro-esophageal reflux disease without esophagitis: Secondary | ICD-10-CM | POA: Diagnosis not present

## 2021-03-06 DIAGNOSIS — H608X3 Other otitis externa, bilateral: Secondary | ICD-10-CM | POA: Diagnosis not present

## 2021-03-06 MED ORDER — LORATADINE 10 MG PO TABS: 10.0000 mg | ORAL_TABLET | Freq: Every day | ORAL | 1 refills | Status: DC

## 2021-03-06 MED ORDER — FLUOXETINE HCL 10 MG PO CAPS
10.0000 mg | ORAL_CAPSULE | Freq: Every day | ORAL | 5 refills | Status: DC
Start: 2021-03-06 — End: 2021-09-05

## 2021-03-06 MED ORDER — FLUOXETINE HCL 20 MG PO CAPS
20.0000 mg | ORAL_CAPSULE | Freq: Every day | ORAL | 5 refills | Status: DC
Start: 2021-03-06 — End: 2021-09-05

## 2021-03-06 MED ORDER — PANTOPRAZOLE SODIUM 40 MG PO TBEC: 40.0000 mg | DELAYED_RELEASE_TABLET | Freq: Every day | ORAL | 1 refills | Status: DC

## 2021-03-06 MED ORDER — GEMFIBROZIL 600 MG PO TABS: 600.0000 mg | ORAL_TABLET | Freq: Two times a day (BID) | ORAL | 1 refills | Status: DC

## 2021-03-06 NOTE — Progress Notes (Signed)
Date:  03/06/2021   Name:  Regina Macdonald   DOB:  05-17-1941   MRN:  [id number]   Chief Complaint: Allergic Rhinitis , Gastroesophageal Reflux, Depression (0 and 0), and Hyperlipidemia  Gastroesophageal Reflux She reports no abdominal pain, no belching, no chest pain, no choking, no coughing, no dysphagia, no early satiety, no globus sensation, no heartburn, no hoarse voice, no nausea, no sore throat, no stridor, no tooth decay, no water brash or no wheezing. This is a chronic problem. The current episode started more than 1 year ago. The problem occurs constantly. The problem has been gradually improving. The symptoms are aggravated by certain foods. Pertinent negatives include no anemia, fatigue, melena, muscle weakness, orthopnea or weight loss. She has tried a PPI for the symptoms. The treatment provided moderate relief. Past procedures do not include an abdominal ultrasound, an EGD, esophageal manometry, esophageal pH monitoring, H. pylori antibody titer or a UGI.  Depression        This is a chronic problem.  The current episode started more than 1 year ago.   The onset quality is gradual.   The problem occurs intermittently.  The problem has been gradually improving since onset.  Associated symptoms include no decreased concentration, no fatigue, no helplessness, no hopelessness, does not have insomnia, not irritable, no restlessness, no decreased interest, no appetite change, no body aches, no myalgias, no headaches, no indigestion, not sad and no suicidal ideas.  Past treatments include SSRIs - Selective serotonin reuptake inhibitors.  Compliance with treatment is variable.  Previous treatment provided moderate relief.   Pertinent negatives include no hypothyroidism. Hyperlipidemia This is a chronic problem. The current episode started more than 1 year ago. The problem is controlled. Recent lipid tests were reviewed and are normal. She has no history of chronic renal disease, diabetes,  hypothyroidism, liver disease, obesity or nephrotic syndrome. There are no known factors aggravating her hyperlipidemia. Pertinent negatives include no chest pain, focal sensory loss, focal weakness, leg pain, myalgias or shortness of breath. Current antihyperlipidemic treatment includes statins. The current treatment provides moderate improvement of lipids. There are no compliance problems.  Risk factors for coronary artery disease include dyslipidemia and hypertension.    Lab Results  Component Value Date   CREATININE 0.9 01/24/2021   BUN 11 12/20/2019   NA 140 12/20/2019   K 4.4 12/20/2019   CL 104 12/20/2019   CO2 23 12/20/2019   Lab Results  Component Value Date   CHOL 157 12/20/2019   HDL 48 12/20/2019   LDLCALC 96 12/20/2019   TRIG 66 12/20/2019   CHOLHDL 3.0 08/19/2017   Lab Results  Component Value Date   TSH 3.16 01/24/2021   Lab Results  Component Value Date   HGBA1C 5.7 (H) 12/02/2015   Lab Results  Component Value Date   WBC 7.2 12/20/2019   HGB 11.7 12/20/2019   HCT 34.3 12/20/2019   MCV 90 12/20/2019   PLT 316 12/20/2019   Lab Results  Component Value Date   ALT 23 01/24/2021   AST 14 01/24/2021   ALKPHOS 112 07/31/2019   BILITOT 0.3 07/31/2019     Review of Systems  Constitutional: Negative for appetite change, fatigue and weight loss.  HENT: Negative for hoarse voice and sore throat.   Respiratory: Negative for cough, choking, shortness of breath and wheezing.   Cardiovascular: Negative for chest pain.  Gastrointestinal: Negative for abdominal pain, dysphagia, heartburn, melena and nausea.  Musculoskeletal: Negative for myalgias and  muscle weakness.  Neurological: Negative for focal weakness and headaches.  Psychiatric/Behavioral: Positive for depression. Negative for decreased concentration and suicidal ideas. The patient does not have insomnia.     Patient Active Problem List   Diagnosis Date Noted  . Acute upper respiratory infection  01/10/2020  . Morbid obesity ([place]) 07/31/2019  . 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 Anxiety 03/23/2018  . Taking medication for chronic disease 03/23/2018  . Other constipation 12/13/2017  . Depression with anxiety 08/19/2017  . Hyperlipidemia 08/19/2017  . Gastroesophageal reflux disease without esophagitis 08/19/2017  . Other chest pain 04/16/2015  . COPD exacerbation ([place]) 04/16/2015  . Sleep apnea 04/16/2015  . 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 Asthmatic bronchitis 04/16/2015  . Abdominal pain, LLQ 03/29/2014  . Rectal bleeding 03/29/2014  . 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 Osteoarthrosis, unspecified whether generalized or localized, pelvic region and thigh 03/26/2014    Allergies  Allergen Reactions  . Ciprofloxacin Other (See Comments)  . Etodolac Other (See Comments)  . Sulfa Antibiotics Rash    Past Surgical History:  Procedure Laterality Date  . ABDOMINAL HYSTERECTOMY    . COLONOSCOPY  06/08/2013   Dr Candace Cruise- small mouth diverticula  . HIP SURGERY Left   . KNEE SURGERY Bilateral   . NASAL RECONSTRUCTION     x 2    Social History   Tobacco Use  . Smoking status: Former Research scientist (life sciences)  . Smokeless tobacco: Never Used  Vaping Use  . Vaping Use: Never used  Substance Use Topics  . Alcohol use: No    Alcohol/week: 0.0 standard drinks  . Drug use: No     Medication list has been reviewed and updated.  Current Meds  Medication Sig  . acetaminophen (TYLENOL) 500 MG tablet Take 500 mg by mouth every 6 (six) hours as needed.  Marland Kitchen aspirin 81 MG chewable tablet Chew by mouth.  Marland Kitchen CALCIUM & MAGNESIUM CARBONATES PO Take 1 tablet by mouth daily.  Marland Kitchen EQUATE STOOL SOFTENER 100 MG capsule Take 1 capsule by mouth twice daily  . FLUoxetine (PROZAC) 10 MG capsule Take 1 capsule (10 mg total) by mouth daily.  Marland Kitchen FLUoxetine (PROZAC) 20 MG capsule Take 1 capsule (20 mg total) by mouth daily.  . fluticasone (FLONASE) 50 MCG/ACT nasal spray 2  sprays each nostril daily  . fluticasone furoate-vilanterol (BREO ELLIPTA) 100-25 MCG/INH AEPB Take one inhalation daily.  Marland Kitchen gemfibrozil (LOPID) 600 MG tablet Take 1 tablet (600 mg total) by mouth 2 (two) times daily.  Marland Kitchen loratadine (CLARITIN) 10 MG tablet Take 1 tablet (10 mg total) by mouth daily.  . Multiple Vitamins-Minerals (CENTRUM SILVER PO) Take 1 capsule by mouth daily.  . pantoprazole (PROTONIX) 40 MG tablet Take 1 tablet (40 mg total) by mouth daily.  . polyethylene glycol (MIRALAX) packet Take 17 g by mouth daily.    PHQ 2/9 Scores 03/06/2021 11/06/2020 10/16/2020 09/05/2020  PHQ - 2 Score 0 3 3 4   PHQ- 9 Score 0 3 3 4     GAD 7 : Generalized Anxiety Score 03/06/2021 11/06/2020 09/05/2020 04/02/2020  Nervous, Anxious, on Edge 0 0 0 0  Control/stop worrying 0 2 2 3   Worry too much - different things 0 2 2 3   Trouble relaxing 0 0 0 0  Restless 0 0 0 0  Easily annoyed or irritable 0 1 1 0  Afraid - awful might happen 0 0 3 3  Total GAD 7 Score 0 5 8 9  Anxiety Difficulty - Not difficult at all Somewhat difficult Not difficult at all    BP Readings from Last 3 Encounters:  03/06/21 130/70  11/06/20 (!) 142/80  10/16/20 (!) 144/68    Physical Exam Vitals and nursing note reviewed.  Constitutional:      General: She is not irritable.    Appearance: She is well-developed.  HENT:     Head: Normocephalic.     Right Ear: Tympanic membrane, ear canal and external ear normal.     Left Ear: Tympanic membrane, ear canal and external ear normal.  Eyes:     General: Lids are everted, no foreign bodies appreciated. No scleral icterus.       Left eye: No foreign body or hordeolum.     Conjunctiva/sclera: Conjunctivae normal.     Right eye: Right conjunctiva is not injected.     Left eye: Left conjunctiva is not injected.     Pupils: Pupils are equal, round, and reactive to light.  Neck:     Thyroid: No thyromegaly.     Vascular: No JVD.     Trachea: No tracheal deviation.   Cardiovascular:     Rate and Rhythm: Normal rate and regular rhythm.     Heart sounds: Normal heart sounds. No murmur heard. No friction rub. No gallop.   Pulmonary:     Effort: Pulmonary effort is normal. No respiratory distress.     Breath sounds: Normal breath sounds. No wheezing or rales.  Abdominal:     General: Bowel sounds are normal.     Palpations: Abdomen is soft. There is no mass.     Tenderness: There is no abdominal tenderness. There is no guarding or rebound.  Musculoskeletal:        General: No tenderness. Normal range of motion.     Cervical back: Normal range of motion and neck supple.  Lymphadenopathy:     Cervical: No cervical adenopathy.  Skin:    General: Skin is warm.     Findings: No rash.  Neurological:     Mental Status: She is alert and oriented to person, place, and time.     Cranial Nerves: No cranial nerve deficit.     Deep Tendon Reflexes: Reflexes normal.  Psychiatric:        Mood and Affect: Mood is not anxious or depressed.     Wt Readings from Last 3 Encounters:  03/06/21 184 lb (83.5 kg)  11/06/20 184 lb (83.5 kg)  10/16/20 180 lb 9.6 oz (81.9 kg)    BP 130/70   Pulse 80   Ht 5\' 3"  (1.6 m)   Wt 184 lb (83.5 kg)   BMI 32.59 kg/m   Assessment and Plan:  1. Current mild episode of major depressive disorder, unspecified whether recurrent (HCC) Chronic.  Controlled.  Stable.  PHQ is 0.  Continue fluoxetine 30 mg once a day.  Will recheck in 6 months - FLUoxetine (PROZAC) 10 MG capsule; Take 1 capsule (10 mg total) by mouth daily.  Dispense: 30 capsule; Refill: 5 - FLUoxetine (PROZAC) 20 MG capsule; Take 1 capsule (20 mg total) by mouth daily.  Dispense: 30 capsule; Refill: 5  2. Anxiety .  Controlled.  Stable.  Gad score is 0.  Continue fluoxetine 30 mg once a day. - FLUoxetine (PROZAC) 10 MG capsule; Take 1 capsule (10 mg total) by mouth daily.  Dispense: 30 capsule; Refill: 5 - FLUoxetine (PROZAC) 20 MG capsule; Take 1 capsule (20 mg  total) by  mouth daily.  Dispense: 30 capsule; Refill: 5  3. Hyperlipidemia, unspecified hyperlipidemia type Chronic.  Controlled.  Stable.  Continue gemfibrozil 600 mg twice a day. - gemfibrozil (LOPID) 600 MG tablet; Take 1 tablet (600 mg total) by mouth 2 (two) times daily.  Dispense: 180 tablet; Refill: 1  4. Seasonal allergic rhinitis due to pollen Chronic.  Episodic.  Stable.  Continue loratadine 10 mg once a day. - loratadine (CLARITIN) 10 MG tablet; Take 1 tablet (10 mg total) by mouth daily.  Dispense: 90 tablet; Refill: 1  5. Gastroesophageal reflux disease without esophagitis Chronic.  Controlled.  Stable.  Continue pantoprazole 40 mg once a day. - pantoprazole (PROTONIX) 40 MG tablet; Take 1 tablet (40 mg total) by mouth daily.  Dispense: 90 tablet; Refill: 1  6. Chronic eczematous otitis externa of both ears New onset.  Persistent.  Patient's had scaliness with itching on the inner aspect of the both pinna.  We will refer to dermatology for evaluation for possible eczema and treatment. - Ambulatory referral to Dermatology

## 2021-03-10 DIAGNOSIS — G4733 Obstructive sleep apnea (adult) (pediatric): Secondary | ICD-10-CM | POA: Diagnosis not present

## 2021-03-12 DIAGNOSIS — L218 Other seborrheic dermatitis: Secondary | ICD-10-CM | POA: Diagnosis not present

## 2021-03-20 ENCOUNTER — Ambulatory Visit: Payer: Medicare Other | Admitting: Internal Medicine

## 2021-03-20 ENCOUNTER — Encounter: Payer: Self-pay | Admitting: Internal Medicine

## 2021-03-20 ENCOUNTER — Other Ambulatory Visit: Payer: Self-pay

## 2021-03-20 VITALS — BP 164/72 | HR 74 | Temp 98.0°F | Resp 16 | Ht 63.0 in | Wt 182.6 lb

## 2021-03-20 DIAGNOSIS — G4733 Obstructive sleep apnea (adult) (pediatric): Secondary | ICD-10-CM | POA: Diagnosis not present

## 2021-03-20 DIAGNOSIS — R0602 Shortness of breath: Secondary | ICD-10-CM | POA: Diagnosis not present

## 2021-03-20 DIAGNOSIS — J449 Chronic obstructive pulmonary disease, unspecified: Secondary | ICD-10-CM | POA: Diagnosis not present

## 2021-03-20 MED ORDER — BREO ELLIPTA 100-25 MCG/INH IN AEPB: INHALATION_SPRAY | RESPIRATORY_TRACT | 4 refills | Status: DC

## 2021-03-20 NOTE — Patient Instructions (Signed)
Chronic Obstructive Pulmonary Disease  Chronic obstructive pulmonary disease (COPD) is a long-term (chronic) lung problem. When you have COPD, it is hard for air to get in and out of your lungs. Usually the condition gets worse over time, and your lungs will never return to normal. There are things you can do to keep yourself as healthy as possible. What are the causes?  Smoking. This is the most common cause.  Certain genes passed from parent to child (inherited). What increases the risk?  Being exposed to secondhand smoke from cigarettes, pipes, or cigars.  Being exposed to chemicals and other irritants, such as fumes and dust in the work environment.  Having chronic lung conditions or infections. What are the signs or symptoms?  Shortness of breath, especially during physical activity.  A long-term cough with a large amount of thick mucus. Sometimes, the cough may not have any mucus (dry cough).  Wheezing.  Breathing quickly.  Skin that looks gray or blue, especially in the fingers, toes, or lips.  Feeling tired (fatigue).  Weight loss.  Chest tightness.  Having infections often.  Episodes when breathing symptoms become much worse (exacerbations). At the later stages of this disease, you may have swelling in the ankles, feet, or legs. How is this treated?  Taking medicines.  Quitting smoking, if you smoke.  Rehabilitation. This includes steps to make your body work better. It may involve a team of specialists.  Doing exercises.  Making changes to your diet.  Using oxygen.  Lung surgery.  Lung transplant.  Comfort measures (palliative care). Follow these instructions at home: Medicines  Take over-the-counter and prescription medicines only as told by your doctor.  Talk to your doctor before taking any cough or allergy medicines. You may need to avoid medicines that cause your lungs to be dry. Lifestyle  If you smoke, stop smoking. Smoking makes the  problem worse.  Do not smoke or use any products that contain nicotine or tobacco. If you need help quitting, ask your doctor.  Avoid being around things that make your breathing worse. This may include smoke, chemicals, and fumes.  Stay active, but remember to rest as well.  Learn and use tips on how to manage stress and control your breathing.  Make sure you get enough sleep. Most adults need at least 7 hours of sleep every night.  Eat healthy foods. Eat smaller meals more often. Rest before meals. Controlled breathing Learn and use tips on how to control your breathing as told by your doctor. Try:  Breathing in (inhaling) through your nose for 1 second. Then, pucker your lips and breath out (exhale) through your lips for 2 seconds.  Putting one hand on your belly (abdomen). Breathe in slowly through your nose for 1 second. Your hand on your belly should move out. Pucker your lips and breathe out slowly through your lips. Your hand on your belly should move in as you breathe out.   Controlled coughing Learn and use controlled coughing to clear mucus from your lungs. Follow these steps: 1. Lean your head a little forward. 2. Breathe in deeply. 3. Try to hold your breath for 3 seconds. 4. Keep your mouth slightly open while coughing 2 times. 5. Spit any mucus out into a tissue. 6. Rest and do the steps again 1 or 2 times as needed. General instructions  Make sure you get all the shots (vaccines) that your doctor recommends. Ask your doctor about a flu shot and a pneumonia shot.    Use oxygen therapy and pulmonary rehabilitation if told by your doctor. If you need home oxygen therapy, ask your doctor if you should buy a tool to measure your oxygen level (oximeter).  Make a COPD action plan with your doctor. This helps you to know what to do if you feel worse than usual.  Manage any other conditions you have as told by your doctor.  Avoid going outside when it is very hot, cold, or  humid.  Avoid people who have a sickness you can catch (contagious).  Keep all follow-up visits. Contact a doctor if:  You cough up more mucus than usual.  There is a change in the color or thickness of the mucus.  It is harder to breathe than usual.  Your breathing is faster than usual.  You have trouble sleeping.  You need to use your medicines more often than usual.  You have trouble doing your normal activities such as getting dressed or walking around the house. Get help right away if:  You have shortness of breath while resting.  You have shortness of breath that stops you from: ? Being able to talk. ? Doing normal activities.  Your chest hurts for longer than 5 minutes.  Your skin color is more blue than usual.  Your pulse oximeter shows that you have low oxygen for longer than 5 minutes.  You have a fever.  You feel too tired to breathe normally. These symptoms may represent a serious problem that is an emergency. Do not wait to see if the symptoms will go away. Get medical help right away. Call your local emergency services (911 in the [place] Do not drive yourself to the hospital. Summary  Chronic obstructive pulmonary disease (COPD) is a long-term lung problem.  The way your lungs work will never return to normal. Usually the condition gets worse over time. There are things you can do to keep yourself as healthy as possible.  Take over-the-counter and prescription medicines only as told by your doctor.  If you smoke, stop. Smoking makes the problem worse. This information is not intended to replace advice given to you by your health care provider. Make sure you discuss any questions you have with your health care provider. Document Revised: 09/10/2020 Document Reviewed: 09/10/2020 Elsevier Patient Education  2021 [employer]   Sleep Apnea Sleep apnea affects breathing during sleep. It causes breathing to stop for a short time or to become shallow. It  can also increase the risk of:  Heart attack.  Stroke.  Being very overweight (obese).  Diabetes.  Heart failure.  Irregular heartbeat. The goal of treatment is to help you breathe normally again. What are the causes? There are three kinds of sleep apnea:  Obstructive sleep apnea. This is caused by a blocked or collapsed airway.  Central sleep apnea. This happens when the brain does not send the right signals to the muscles that control breathing.  Mixed sleep apnea. This is a combination of obstructive and central sleep apnea. The most common cause of this condition is a collapsed or blocked airway. This can happen if:  Your throat muscles are too relaxed.  Your tongue and tonsils are too large.  You are overweight.  Your airway is too small.   What increases the risk?  Being overweight.  Smoking.  Having a small airway.  Being older.  Being female.  Drinking alcohol.  Taking medicines to calm yourself (sedatives or tranquilizers).  Having family members with the condition. What   are the signs or symptoms?  Trouble staying asleep.  Being sleepy or tired during the day.  Getting angry a lot.  Loud snoring.  Headaches in the morning.  Not being able to focus your mind (concentrate).  Forgetting things.  Less interest in sex.  Mood swings.  Personality changes.  Feelings of sadness (depression).  Waking up a lot during the night to pee (urinate).  Dry mouth.  Sore throat. How is this diagnosed?  Your medical history.  A physical exam.  A test that is done when you are sleeping (sleep study). The test is most often done in a sleep lab but may also be done at home. How is this treated?  Sleeping on your side.  Using a medicine to get rid of mucus in your nose (decongestant).  Avoiding the use of alcohol, medicines to help you relax, or certain pain medicines (narcotics).  Losing weight, if needed.  Changing your diet.  Not  smoking.  Using a machine to open your airway while you sleep, such as: ? An oral appliance. This is a mouthpiece that shifts your lower jaw forward. ? A CPAP device. This device blows air through a mask when you breathe out (exhale). ? An EPAP device. This has valves that you put in each nostril. ? A BPAP device. This device blows air through a mask when you breathe in (inhale) and breathe out.  Having surgery if other treatments do not work. It is important to get treatment for sleep apnea. Without treatment, it can lead to:  High blood pressure.  Coronary artery disease.  In men, not being able to have an erection (impotence).  Reduced thinking ability.   Follow these instructions at home: Lifestyle  Make changes that your doctor recommends.  Eat a healthy diet.  Lose weight if needed.  Avoid alcohol, medicines to help you relax, and some pain medicines.  Do not use any products that contain nicotine or tobacco, such as cigarettes, e-cigarettes, and chewing tobacco. If you need help quitting, ask your doctor. General instructions  Take over-the-counter and prescription medicines only as told by your doctor.  If you were given a machine to use while you sleep, use it only as told by your doctor.  If you are having surgery, make sure to tell your doctor you have sleep apnea. You may need to bring your device with you.  Keep all follow-up visits as told by your doctor. This is important. Contact a doctor if:  The machine that you were given to use during sleep bothers you or does not seem to be working.  You do not get better.  You get worse. Get help right away if:  Your chest hurts.  You have trouble breathing in enough air.  You have an uncomfortable feeling in your back, arms, or stomach.  You have trouble talking.  One side of your body feels weak.  A part of your face is hanging down. These symptoms may be an emergency. Do not wait to see if the  symptoms will go away. Get medical help right away. Call your local emergency services (911 in the [place] Do not drive yourself to the hospital. Summary  This condition affects breathing during sleep.  The most common cause is a collapsed or blocked airway.  The goal of treatment is to help you breathe normally while you sleep. This information is not intended to replace advice given to you by your health care provider. Make sure you   discuss any questions you have with your health care provider. Document Revised: 08/19/2018 Document Reviewed: 06/28/2018 Elsevier Patient Education  2021 [employer].   

## 2021-03-20 NOTE — Progress Notes (Signed)
[facility] [address] [place], [place] [address]  Pulmonary Sleep Medicine   Office Visit Note  Patient Name: Regina Macdonald DOB: 1941/03/30 MRN [id number]  Date of Service: 03/20/2021  Complaints/HPI: COPD OSA Obesity she states overall she is doing fairly well.  Has been using the PAP device with good results.  Denies having any complications related to using the device.  States she has some shortness of breath and she feels that this is probably related to the fact that she has gained significant weight the patient has no chest pain no palpitations noted.  She did have spirometry done which is essentially unchanged from the prior spirometry's.  She has not had any admissions to the hospital.  She has not had any complications related to the use of the PAP  ROS  General: (-) fever, (-) chills, (-) night sweats, (-) weakness Skin: (-) rashes, (-) itching,. Eyes: (-) visual changes, (-) redness, (-) itching. Nose and Sinuses: (-) nasal stuffiness or itchiness, (-) postnasal drip, (-) nosebleeds, (-) sinus trouble. Mouth and Throat: (-) sore throat, (-) hoarseness. Neck: (-) swollen glands, (-) enlarged thyroid, (-) neck pain. Respiratory: - cough, (-) bloody sputum, + shortness of breath, - wheezing. Cardiovascular: - ankle swelling, (-) chest pain. Lymphatic: (-) lymph node enlargement. Neurologic: (-) numbness, (-) tingling. Psychiatric: (-) anxiety, (-) depression   Current Medication: Outpatient Encounter Medications as of 03/20/2021  Medication Sig  . acetaminophen (TYLENOL) 500 MG tablet Take 500 mg by mouth every 6 (six) hours as needed.  Marland Kitchen aspirin 81 MG chewable tablet Chew by mouth.  Marland Kitchen CALCIUM & MAGNESIUM CARBONATES PO Take 1 tablet by mouth daily.  Marland Kitchen EQUATE STOOL SOFTENER 100 MG capsule Take 1 capsule by mouth twice daily  . FLUoxetine (PROZAC) 10 MG capsule Take 1 capsule (10 mg total) by mouth daily.  Marland Kitchen FLUoxetine (PROZAC) 20 MG capsule Take 1 capsule  (20 mg total) by mouth daily.  . fluticasone (FLONASE) 50 MCG/ACT nasal spray 2 sprays each nostril daily  . fluticasone furoate-vilanterol (BREO ELLIPTA) 100-25 MCG/INH AEPB Inhale 1 puff by mouth once daily  . gemfibrozil (LOPID) 600 MG tablet Take 1 tablet (600 mg total) by mouth 2 (two) times daily.  Marland Kitchen loratadine (CLARITIN) 10 MG tablet Take 1 tablet (10 mg total) by mouth daily.  . Multiple Vitamins-Minerals (CENTRUM SILVER PO) Take 1 capsule by mouth daily.  . pantoprazole (PROTONIX) 40 MG tablet Take 1 tablet (40 mg total) by mouth daily.  . polyethylene glycol (MIRALAX) packet Take 17 g by mouth daily.  . fluocinonide (LIDEX) 0.05 % external solution Apply topically 2 (two) times daily.  Marland Kitchen ketoconazole (NIZORAL) 2 % shampoo Apply topically 3 (three) times a week.   No facility-administered encounter medications on file as of 03/20/2021.    Surgical History: Past Surgical History:  Procedure Laterality Date  . ABDOMINAL HYSTERECTOMY    . COLONOSCOPY  06/08/2013   Dr Bluford Kaufmann- small mouth diverticula  . HIP SURGERY Left   . KNEE SURGERY Bilateral   . NASAL RECONSTRUCTION     x 2    Medical History: Past Medical History:  Diagnosis Date  . Anxiety   . Asthma   . Cancer (HCC) cervical-1973  . COPD (chronic obstructive pulmonary disease) (HCC)   . Depression   . Diverticula, colon   . Diverticulosis   . GERD (gastroesophageal reflux disease)   . Sleep apnea     Family History: Family History  Problem Relation Age  of Onset  . Cirrhosis Father   . Coronary artery disease Mother   . Breast cancer Neg Hx     Social History: Social History   Socioeconomic History  . Marital status: Divorced    Spouse name: Not on file  . Number of children: 3  . Years of education: Not on file  . Highest education level: High school graduate  Occupational History  . Occupation: retired  Tobacco Use  . Smoking status: Former Research scientist (life sciences)  . Smokeless tobacco: Never Used  Vaping Use  .  Vaping Use: Never used  Substance and Sexual Activity  . Alcohol use: No    Alcohol/week: 0.0 standard drinks  . Drug use: No  . Sexual activity: Not Currently    Birth control/protection: Post-menopausal  Other Topics Concern  . Not on file  Social History Narrative   Pt lives with her daughter   Social Determinants of Health   Financial Resource Strain: Low Risk   . Difficulty of Paying Living Expenses: Not hard at all  Food Insecurity: No Food Insecurity  . Worried About Charity fundraiser in the Last Year: Never true  . Ran Out of Food in the Last Year: Never true  Transportation Needs: No Transportation Needs  . Lack of Transportation (Medical): No  . Lack of Transportation (Non-Medical): No  Physical Activity: Inactive  . Days of Exercise per Week: 0 days  . Minutes of Exercise per Session: 0 min  Stress: No Stress Concern Present  . Feeling of Stress : Only a little  Social Connections: Socially Isolated  . Frequency of Communication with Friends and Family: More than three times a week  . Frequency of Social Gatherings with Friends and Family: More than three times a week  . Attends Religious Services: Never  . Active Member of Clubs or Organizations: No  . Attends Archivist Meetings: Never  . Marital Status: Divorced  Human resources officer Violence: Not At Risk  . Fear of Current or Ex-Partner: No  . Emotionally Abused: No  . Physically Abused: No  . Sexually Abused: No    Vital Signs: Blood pressure (!) 164/72, pulse 74, temperature 98 F (36.7 C), resp. rate 16, height 5\' 3"  (1.6 m), weight 182 lb 9.6 oz (82.8 kg), SpO2 95 %.  Examination: General Appearance: The patient is well-developed, well-nourished, and in no distress. Skin: Gross inspection of skin unremarkable. Head: normocephalic, no gross deformities. Eyes: no gross deformities noted. ENT: ears appear grossly normal no exudates. Neck: Supple. No thyromegaly. No LAD. Respiratory: no  rhonch noted. Cardiovascular: Normal S1 and S2 without murmur or rub. Extremities: No cyanosis. pulses are equal. Neurologic: Alert and oriented. No involuntary movements.  LABS: Recent Results (from the past 2160 hour(s))  Basic metabolic panel     Status: None   Collection Time: 01/24/21 12:00 AM  Result Value Ref Range   Creatinine 0.9 0.5 - 1.1  Comprehensive metabolic panel     Status: None   Collection Time: 01/24/21 12:00 AM  Result Value Ref Range   GFR calc non Af Amer 60   Hepatic function panel     Status: None   Collection Time: 01/24/21 12:00 AM  Result Value Ref Range   ALT 23 7 - 35   AST 14 13 - 35  TSH     Status: None   Collection Time: 01/24/21 12:00 AM  Result Value Ref Range   TSH 3.16 0.41 - 5.90    Radiology:  MM 3D SCREEN BREAST BILATERAL  Result Date: 05/15/2020 CLINICAL DATA:  Screening. EXAM: DIGITAL SCREENING BILATERAL MAMMOGRAM WITH TOMO AND CAD COMPARISON:  Previous exam(s). ACR Breast Density Category b: There are scattered areas of fibroglandular density. FINDINGS: There are no findings suspicious for malignancy. Images were processed with CAD. IMPRESSION: No mammographic evidence of malignancy. A result letter of this screening mammogram will be mailed directly to the patient. RECOMMENDATION: Screening mammogram in one year. (Code:SM-B-01Y) BI-RADS CATEGORY  1: Negative. Electronically Signed   By: Audie Pinto M.D.   On: 05/15/2020 08:15    No results found.  No results found.    Assessment and Plan: Patient Active Problem List   Diagnosis Date Noted  . Acute upper respiratory infection 01/10/2020  . Morbid obesity ([place]) 07/31/2019  . Mild episode of recurrent major depressive disorder ([place]) 03/23/2018  . Anxiety 03/23/2018  . Taking medication for chronic disease 03/23/2018  . Other constipation 12/13/2017  . Depression with anxiety 08/19/2017  . Hyperlipidemia 08/19/2017  . Gastroesophageal reflux disease without esophagitis  08/19/2017  . Other chest pain 04/16/2015  . COPD exacerbation ([place]) 04/16/2015  . Sleep apnea 04/16/2015  . 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 Asthmatic bronchitis 04/16/2015  . Abdominal pain, LLQ 03/29/2014  . Rectal bleeding 03/29/2014  . 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 Osteoarthrosis, unspecified whether generalized or localized, pelvic region and thigh 03/26/2014    1. Obstructive sleep apnea CPAP Counseling: had a lengthy discussion with the patient regarding the importance of PAP therapy in management of the sleep apnea. Patient appears to understand the risk factor reduction and also understands the risks associated with untreated sleep apnea. Patient will try to make a good faith effort to remain compliant with therapy. Also instructed the patient on proper cleaning of the device including the water must be changed daily if possible and use of distilled water is preferred. Patient understands that the machine should be regularly cleaned with appropriate recommended cleaning solutions that do not damage the PAP machine for example given white vinegar and water rinses. Other methods such as ozone treatment may not be as good as these simple methods to achieve cleaning.   2. SOB (shortness of breath) At baseline with underlying COPD plan is going to be to continue with her inhalers as necessary prescription was given today for her Breo - Spirometry with Graph  3. Morbid obesity ([place]) Obesity Counseling: Had a lengthy discussion regarding patients BMI and weight issues. Patient was instructed on portion control as well as increased activity. Also discussed caloric restrictions with trying to maintain intake less than 2000 Kcal. Discussions were made in accordance with the 5As of weight management. Simple actions such as not eating late and if able to, taking a walk is suggested.   4. Obstructive chronic bronchitis without exacerbation ([place]) Breo inhaler renewal was given today we  will continue to use such.  She has had good response we will continue  General Counseling: I have discussed the findings of the evaluation and examination with [place].  I have also discussed any further diagnostic evaluation thatmay be needed or ordered today. Vernel verbalizes understanding of the findings of todays visit. We also reviewed her medications today and discussed drug interactions and side effects including but not limited excessive drowsiness and altered mental states. We also discussed that there is always a risk not just to her but also people around her. she has been encouraged to call the office with any questions or concerns that  should arise related to todays visit.  Orders Placed This Encounter  Procedures  . Spirometry with Graph    Order Specific Question:   Where should this test be performed?    Answer:   [facility]    Order Specific Question:   Basic spirometry    Answer:   Yes    Order Specific Question:   Spirometry pre & post bronchodilator    Answer:   No     Time spent: 56  I have personally obtained a history, examined the patient, evaluated laboratory and imaging results, formulated the assessment and plan and placed orders.    [name], MD [facility] Pulmonary and Critical Care Sleep medicine

## 2021-03-22 ENCOUNTER — Other Ambulatory Visit: Payer: Self-pay | Admitting: Hospice and Palliative Medicine

## 2021-03-22 DIAGNOSIS — J449 Chronic obstructive pulmonary disease, unspecified: Secondary | ICD-10-CM

## 2021-04-11 ENCOUNTER — Other Ambulatory Visit: Payer: Self-pay | Admitting: Family Medicine

## 2021-04-11 DIAGNOSIS — Z1231 Encounter for screening mammogram for malignant neoplasm of breast: Secondary | ICD-10-CM

## 2021-05-07 NOTE — Telephone Encounter (Signed)
erorr

## 2021-05-14 ENCOUNTER — Ambulatory Visit
Admission: RE | Admit: 2021-05-14 | Discharge: 2021-05-14 | Disposition: A | Discharge status: Home / Self Care | Payer: Medicare Other | Source: Ambulatory Visit | Attending: Family Medicine | Admitting: Family Medicine

## 2021-05-14 ENCOUNTER — Other Ambulatory Visit: Payer: Self-pay

## 2021-05-14 DIAGNOSIS — Z1231 Encounter for screening mammogram for malignant neoplasm of breast: Secondary | ICD-10-CM

## 2021-06-09 DIAGNOSIS — G4733 Obstructive sleep apnea (adult) (pediatric): Secondary | ICD-10-CM | POA: Diagnosis not present

## 2021-07-29 ENCOUNTER — Emergency Department
Admission: EM | Admit: 2021-07-29 | Discharge: 2021-07-29 | Disposition: A | Discharge status: Home / Self Care | Payer: Medicare Other | Attending: Emergency Medicine | Admitting: Emergency Medicine

## 2021-07-29 ENCOUNTER — Other Ambulatory Visit: Payer: Self-pay

## 2021-07-29 DIAGNOSIS — W293XXA Contact with powered garden and outdoor hand tools and machinery, initial encounter: Secondary | ICD-10-CM | POA: Diagnosis not present

## 2021-07-29 DIAGNOSIS — J45909 Unspecified asthma, uncomplicated: Secondary | ICD-10-CM | POA: Diagnosis not present

## 2021-07-29 DIAGNOSIS — S81811A Laceration without foreign body, right lower leg, initial encounter: Secondary | ICD-10-CM | POA: Diagnosis not present

## 2021-07-29 DIAGNOSIS — Z87891 Personal history of nicotine dependence: Secondary | ICD-10-CM | POA: Diagnosis not present

## 2021-07-29 DIAGNOSIS — Z23 Encounter for immunization: Secondary | ICD-10-CM | POA: Insufficient documentation

## 2021-07-29 DIAGNOSIS — Z7982 Long term (current) use of aspirin: Secondary | ICD-10-CM | POA: Insufficient documentation

## 2021-07-29 DIAGNOSIS — J449 Chronic obstructive pulmonary disease, unspecified: Secondary | ICD-10-CM | POA: Insufficient documentation

## 2021-07-29 DIAGNOSIS — Z859 Personal history of malignant neoplasm, unspecified: Secondary | ICD-10-CM | POA: Insufficient documentation

## 2021-07-29 DIAGNOSIS — Z7951 Long term (current) use of inhaled steroids: Secondary | ICD-10-CM | POA: Insufficient documentation

## 2021-07-29 DIAGNOSIS — S8991XA Unspecified injury of right lower leg, initial encounter: Secondary | ICD-10-CM | POA: Diagnosis present

## 2021-07-29 MED ORDER — LIDOCAINE HCL 1 % IJ SOLN
5.0000 mL | Freq: Once | INTRAMUSCULAR | Status: AC
  Administered 2021-07-29: 5 mL
  Filled 2021-07-29: qty 10

## 2021-07-29 MED ORDER — TETANUS-DIPHTH-ACELL PERTUSSIS 5-2.5-18.5 LF-MCG/0.5 IM SUSY
0.5000 mL | PREFILLED_SYRINGE | Freq: Once | INTRAMUSCULAR | Status: AC
  Administered 2021-07-29: 0.5 mL via INTRAMUSCULAR
  Filled 2021-07-29: qty 0.5

## 2021-07-29 MED ORDER — CEPHALEXIN 500 MG PO CAPS: 500.0000 mg | ORAL_CAPSULE | Freq: Two times a day (BID) | ORAL | 0 refills | Status: AC

## 2021-07-29 NOTE — Discharge Instructions (Signed)
Have sutures removed in seven days. Take Keflex twice daily for seven days.

## 2021-07-29 NOTE — ED Provider Notes (Signed)
[facility]-EMERGENCY DEPARTMENT  ____________________________________________  Time seen: Approximately 5:41 PM  I have reviewed the triage vital signs and the nursing notes.   HISTORY  Chief Complaint Laceration (RIGHT KNEE LACERATION , PT HAS BUTTERFLY BANDAGES ON CLIPPED HER KNEE WITH ROSE PRUNERS )   Historian Pateitn     HPI Regina Macdonald is a 80 y.o. female presents to the emergency department with a 2 cm laceration along right anterior shin sustained with a pair of hedge clippers accidentally at 115 today.  No numbness or tingling.  Patient cannot recall her last tetanus shot.   Past Medical History:  Diagnosis Date   Anxiety    Asthma    Cancer ([place]) cervical-1973   COPD (chronic obstructive pulmonary disease) (HCC)    Depression    Diverticula, colon    Diverticulosis    GERD (gastroesophageal reflux disease)    Sleep apnea      Immunizations up to date:  Yes.     Past Medical History:  Diagnosis Date   Anxiety    Asthma    Cancer ([place]) cervical-1973   COPD (chronic obstructive pulmonary disease) ([place])    Depression    Diverticula, colon    Diverticulosis    GERD (gastroesophageal reflux disease)    Sleep apnea     Patient Active Problem List   Diagnosis Date Noted   Acute upper respiratory infection 01/10/2020   Morbid obesity ([place]) 07/31/2019   Mild episode of recurrent major depressive disorder ([place]) 03/23/2018   Anxiety 03/23/2018   Taking medication for chronic disease 03/23/2018   Other constipation 12/13/2017   Depression with anxiety 08/19/2017   Hyperlipidemia 08/19/2017   Gastroesophageal reflux disease without esophagitis 08/19/2017   Other chest pain 04/16/2015   COPD exacerbation ([place]) 04/16/2015   Sleep apnea 04/16/2015   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   Asthmatic bronchitis 04/16/2015   Abdominal pain, LLQ 03/29/2014   Rectal bleeding 03/29/2014   Arthritis, 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   Osteoarthrosis, unspecified whether  generalized or localized, pelvic region and thigh 03/26/2014    Past Surgical History:  Procedure Laterality Date   ABDOMINAL HYSTERECTOMY     COLONOSCOPY  06/08/2013   Dr Candace Cruise- small mouth diverticula   HIP SURGERY Left    KNEE SURGERY Bilateral    NASAL RECONSTRUCTION     x 2    Prior to Admission medications   Medication Sig Start Date End Date Taking? Authorizing Provider  cephALEXin (KEFLEX) 500 MG capsule Take 1 capsule (500 mg total) by mouth 2 (two) times daily for 7 days. 07/29/21 08/05/21 Yes Vallarie Mare M, PA-C  acetaminophen (TYLENOL) 500 MG tablet Take 500 mg by mouth every 6 (six) hours as needed.    [provider]  aspirin 81 MG chewable tablet Chew by mouth.    [provider]  BREO ELLIPTA 100-25 MCG/INH AEPB Inhale 1 puff by mouth once daily 03/24/21   [name], MD  CALCIUM & MAGNESIUM CARBONATES PO Take 1 tablet by mouth daily.    [provider]  EQUATE STOOL SOFTENER 100 MG capsule Take 1 capsule by mouth twice daily 03/24/19   Juline Patch, MD  fluocinonide (LIDEX) 0.05 % external solution Apply topically 2 (two) times daily. 03/12/21   [provider]  FLUoxetine (PROZAC) 10 MG capsule Take 1 capsule (10 mg total) by mouth daily. 03/06/21   Juline Patch, MD  FLUoxetine (PROZAC) 20 MG capsule Take 1 capsule (20 mg total) by mouth daily.  03/06/21   Juline Patch, MD  fluticasone (FLONASE) 50 MCG/ACT nasal spray 2 sprays each nostril daily 09/05/20   Juline Patch, MD  gemfibrozil (LOPID) 600 MG tablet Take 1 tablet (600 mg total) by mouth 2 (two) times daily. 03/06/21   Juline Patch, MD  ketoconazole (NIZORAL) 2 % shampoo Apply topically 3 (three) times a week. 03/12/21   [provider]  loratadine (CLARITIN) 10 MG tablet Take 1 tablet (10 mg total) by mouth daily. 03/06/21   Juline Patch, MD  Multiple Vitamins-Minerals (CENTRUM SILVER PO) Take 1 capsule by mouth daily.    [provider]   pantoprazole (PROTONIX) 40 MG tablet Take 1 tablet (40 mg total) by mouth daily. 03/06/21   Juline Patch, MD  polyethylene glycol [facility]) packet Take 17 g by mouth daily. 07/20/17   Schuyler Amor, MD    Allergies Ciprofloxacin, Etodolac, and Sulfa antibiotics  Family History  Problem Relation Age of Onset   Cirrhosis Father    Coronary artery disease Mother    Breast cancer Neg Hx     Social History Social History   Tobacco Use   Smoking status: Former   Smokeless tobacco: Never  Scientific laboratory technician Use: Never used  Substance Use Topics   Alcohol use: No    Alcohol/week: 0.0 standard drinks   Drug use: No     Review of Systems  Constitutional: No fever/chills Eyes:  No discharge ENT: No upper respiratory complaints. Respiratory: no cough. No SOB/ use of accessory muscles to breath Gastrointestinal:   No nausea, no vomiting.  No diarrhea.  No constipation.  Musculoskeletal: Negative for musculoskeletal pain. Skin: Patient has right shin laceration.     ____________________________________________   PHYSICAL EXAM:  VITAL SIGNS: ED Triage Vitals  Enc Vitals Group     BP 07/29/21 1502 (!) 176/60     Pulse Rate 07/29/21 1502 65     Resp 07/29/21 1502 17     Temp 07/29/21 1502 98.1 F (36.7 C)     Temp Source 07/29/21 1502 Oral     SpO2 07/29/21 1502 98 %     Weight 07/29/21 1700 182 lb 8.7 oz (82.8 kg)     Height 07/29/21 1700 '5\' 3"'$  (1.6 m)     Head Circumference --      Peak Flow --      Pain Score 07/29/21 1502 0     Pain Loc --      Pain Edu? --      Excl. in [place]? --      Constitutional: Alert and oriented. Well appearing and in no acute distress. Eyes: Conjunctivae are normal. PERRL. EOMI. Head: Atraumatic. ENT: Cardiovascular: Normal rate, regular rhythm. Normal S1 and S2.  Good peripheral circulation. Respiratory: Normal respiratory effort without tachypnea or retractions. Lungs CTAB. Good air entry to the bases with no decreased or absent  breath sounds Gastrointestinal: Bowel sounds x 4 quadrants. Soft and nontender to palpation. No guarding or rigidity. No distention. Musculoskeletal: Full range of motion to all extremities. No obvious deformities noted Neurologic:  Normal for age. No gross focal neurologic deficits are appreciated.  Skin: Patient has 2 cm right anterior shin laceration deep to underlying Psychiatric: Mood and affect are normal for age. Speech and behavior are normal.   ____________________________________________   LABS (all labs ordered are listed, but only abnormal results are displayed)  Labs Reviewed - No data to display ____________________________________________  EKG  ____________________________________________  RADIOLOGY   No results found.  ____________________________________________    PROCEDURES  Procedure(s) performed:     Marland KitchenMarland KitchenLaceration Repair  Date/Time: 07/29/2021 5:46 PM Performed by: Lannie Fields, PA-C Authorized by: Lannie Fields, PA-C   Consent:    Consent obtained:  Verbal   Risks discussed:  Infection and pain Universal protocol:    Procedure explained and questions answered to patient or proxy's satisfaction: yes     Patient identity confirmed:  Verbally with patient Anesthesia:    Anesthesia method:  Local infiltration   Local anesthetic:  Lidocaine 1% w/o epi Laceration details:    Location:  Leg   Leg location:  R lower leg   Length (cm):  2   Depth (mm):  5 Pre-procedure details:    Preparation:  Patient was prepped and draped in usual sterile fashion Exploration:    Limited defect created (wound extended): no     Contaminated: no   Treatment:    Area cleansed with:  Povidone-iodine   Irrigation solution:  Sterile saline   Debridement:  None Skin repair:    Repair method:  Sutures   Suture size:  4-0   Suture technique:  Running locked   Number of sutures:  6 Approximation:    Approximation:  Close Post-procedure details:     Dressing:  Non-adherent dressing     Medications  Tdap (BOOSTRIX) injection 0.5 mL (has no administration in time range)  lidocaine (XYLOCAINE) 1 % (with pres) injection 5 mL (5 mLs Infiltration Given by Other 07/29/21 1724)     ____________________________________________   INITIAL IMPRESSION / ASSESSMENT AND PLAN / ED COURSE  Pertinent labs & imaging results that were available during my care of the patient were reviewed by me and considered in my medical decision making (see chart for details).       Assessment and Plan: Shin laceration:  80 y/o female presents to the ED with a two cm right shin laceration repaired in the ED without complication.   Patient was advised to have external sutures removed by primary care in 7 days.  Patient was discharged with low-dose Keflex to be taken twice daily over the next 7 days.  Tetanus status was updated in the emergency department.    ____________________________________________  FINAL CLINICAL IMPRESSION(S) / ED DIAGNOSES  Final diagnoses:  Laceration of right lower extremity, initial encounter      NEW MEDICATIONS STARTED DURING THIS VISIT:  ED Discharge Orders          Ordered    cephALEXin (KEFLEX) 500 MG capsule  2 times daily        07/29/21 1738                This chart was dictated using voice recognition software/Dragon. Despite best efforts to proofread, errors can occur which can change the meaning. Any change was purely unintentional.     Lannie Fields, PA-C 07/29/21 1750    Nance Pear, MD 07/29/21 [place]

## 2021-07-29 NOTE — ED Triage Notes (Signed)
PT CLIPPED HER RIGHT KNEE WITH ROSE PRUNERS , BLEEDING CONTROLLED

## 2021-08-15 ENCOUNTER — Other Ambulatory Visit: Payer: Self-pay

## 2021-08-15 ENCOUNTER — Encounter: Payer: Self-pay | Admitting: Nurse Practitioner

## 2021-08-15 ENCOUNTER — Ambulatory Visit: Payer: Medicare Other | Admitting: Nurse Practitioner

## 2021-08-15 VITALS — BP 143/65 | HR 70 | Temp 98.1°F | Resp 16 | Ht 63.0 in | Wt 178.6 lb

## 2021-08-15 DIAGNOSIS — Z9989 Dependence on other enabling machines and devices: Secondary | ICD-10-CM

## 2021-08-15 DIAGNOSIS — R0602 Shortness of breath: Secondary | ICD-10-CM | POA: Diagnosis not present

## 2021-08-15 DIAGNOSIS — J449 Chronic obstructive pulmonary disease, unspecified: Secondary | ICD-10-CM

## 2021-08-15 DIAGNOSIS — G4733 Obstructive sleep apnea (adult) (pediatric): Secondary | ICD-10-CM

## 2021-08-15 MED ORDER — FLUTICASONE FUROATE-VILANTEROL 100-25 MCG/INH IN AEPB: 1.0000 | INHALATION_SPRAY | Freq: Every day | RESPIRATORY_TRACT | 5 refills | Status: DC

## 2021-08-15 NOTE — Progress Notes (Signed)
[facility] [place], [place] [address]  Internal MEDICINE  Office Visit Note  Patient Name: Regina Macdonald  [id number]  [id number]  Date of Service: 08/31/2021  Chief Complaint  Patient presents with   Follow-up    refills   Sleep Apnea   COPD   Asthma    HPI Leoni presents for a follow up visit for refills. She also did spiro in office today. Her spiro results remain stable and unchanged. Her results were grossly normal with slight decrease in FVC by 0.18 to 1.45 and slight decrease in FEV1 by 0.04 to 1.32 with an overall increase in FEV1/FVC ratio by 7.41% to 91.09%.  She is currently using breo ellipta as her maintenance inhaler which she reports is working very well. She denies any SOB, wheezing, cough or difficulty breathing.  Her last documented cpap download was in march this year and she was 93% compliant, AHI is 4.8/hr, 95th %ile pressure is 11.3 and 95th %ile leak is less than 50%.   Current Medication: Outpatient Encounter Medications as of 08/15/2021  Medication Sig   acetaminophen (TYLENOL) 500 MG tablet Take 500 mg by mouth every 6 (six) hours as needed.   aspirin 81 MG chewable tablet Chew by mouth.   CALCIUM & MAGNESIUM CARBONATES PO Take 1 tablet by mouth daily.   EQUATE STOOL SOFTENER 100 MG capsule Take 1 capsule by mouth twice daily   FLUoxetine (PROZAC) 10 MG capsule Take 1 capsule (10 mg total) by mouth daily.   FLUoxetine (PROZAC) 20 MG capsule Take 1 capsule (20 mg total) by mouth daily.   fluticasone (FLONASE) 50 MCG/ACT nasal spray 2 sprays each nostril daily   gemfibrozil (LOPID) 600 MG tablet Take 1 tablet (600 mg total) by mouth 2 (two) times daily.   Multiple Vitamins-Minerals (CENTRUM SILVER PO) Take 1 capsule by mouth daily.   pantoprazole (PROTONIX) 40 MG tablet Take 1 tablet (40 mg total) by mouth daily.   polyethylene glycol (MIRALAX) packet Take 17 g by mouth daily.   [DISCONTINUED] BREO ELLIPTA 100-25 MCG/INH  AEPB Inhale 1 puff by mouth once daily   fluticasone furoate-vilanterol (BREO ELLIPTA) 100-25 MCG/INH AEPB Inhale 1 puff into the lungs daily.   [DISCONTINUED] fluocinonide (LIDEX) 0.05 % external solution Apply topically 2 (two) times daily. (Patient not taking: Reported on 08/15/2021)   [DISCONTINUED] ketoconazole (NIZORAL) 2 % shampoo Apply topically 3 (three) times a week. (Patient not taking: Reported on 08/15/2021)   [DISCONTINUED] loratadine (CLARITIN) 10 MG tablet Take 1 tablet (10 mg total) by mouth daily. (Patient not taking: Reported on 08/15/2021)   No facility-administered encounter medications on file as of 08/15/2021.    Surgical History: Past Surgical History:  Procedure Laterality Date   ABDOMINAL HYSTERECTOMY     COLONOSCOPY  06/08/2013   Dr Candace Cruise- small mouth diverticula   HIP SURGERY Left    KNEE SURGERY Bilateral    NASAL RECONSTRUCTION     x 2    Medical History: Past Medical History:  Diagnosis Date   Anxiety    Asthma    Cancer ([place]) cervical-1973   COPD (chronic obstructive pulmonary disease) (HCC)    Depression    Diverticula, colon    Diverticulosis    GERD (gastroesophageal reflux disease)    Sleep apnea     Family History: Family History  Problem Relation Age of Onset   Cirrhosis Father    Coronary artery disease Mother    Breast cancer Neg Hx  Social History   Socioeconomic History   Marital status: Divorced    Spouse name: Not on file   Number of children: 3   Years of education: Not on file   Highest education level: High school graduate  Occupational History   Occupation: retired  Tobacco Use   Smoking status: Former   Smokeless tobacco: Never  Scientific laboratory technician Use: Never used  Substance and Sexual Activity   Alcohol use: No    Alcohol/week: 0.0 standard drinks   Drug use: No   Sexual activity: Not Currently    Birth control/protection: Post-menopausal  Other Topics Concern   Not on file  Social History Narrative   Pt  lives with her daughter   Social Determinants of Health   Financial Resource Strain: Low Risk    Difficulty of Paying Living Expenses: Not hard at all  Food Insecurity: No Food Insecurity   Worried About Charity fundraiser in the Last Year: Never true   Arboriculturist in the Last Year: Never true  Transportation Needs: No Transportation Needs   Lack of Transportation (Medical): No   Lack of Transportation (Non-Medical): No  Physical Activity: Inactive   Days of Exercise per Week: 0 days   Minutes of Exercise per Session: 0 min  Stress: No Stress Concern Present   Feeling of Stress : Only a little  Social Connections: Socially Isolated   Frequency of Communication with Friends and Family: More than three times a week   Frequency of Social Gatherings with Friends and Family: More than three times a week   Attends Religious Services: Never   Marine scientist or Organizations: No   Attends Music therapist: Never   Marital Status: Divorced  Human resources officer Violence: Not At Risk   Fear of Current or Ex-Partner: No   Emotionally Abused: No   Physically Abused: No   Sexually Abused: No      Review of Systems  Constitutional:  Negative for appetite change, chills, fatigue and fever.  HENT: Negative.  Negative for congestion, postnasal drip, rhinorrhea and sinus pain.   Respiratory: Negative.  Negative for cough, chest tightness, shortness of breath and wheezing.   Cardiovascular: Negative.  Negative for chest pain and palpitations.  Gastrointestinal: Negative.   Neurological:  Negative for headaches.   Vital Signs: BP (!) 143/65   Pulse 70   Temp 98.1 F (36.7 C)   Resp 16   Ht 5\' 3"  (1.6 m)   Wt 178 lb 9.6 oz (81 kg)   SpO2 98%   BMI 31.64 kg/m    Physical Exam Vitals reviewed.  Constitutional:      General: She is not in acute distress.    Appearance: Normal appearance. She is obese. She is not ill-appearing.  HENT:     Head: Normocephalic  and atraumatic.  Eyes:     Extraocular Movements: Extraocular movements intact.     Pupils: Pupils are equal, round, and reactive to light.  Cardiovascular:     Rate and Rhythm: Normal rate and regular rhythm.     Pulses: Normal pulses.     Heart sounds: Normal heart sounds. No murmur heard. Pulmonary:     Effort: Pulmonary effort is normal. No respiratory distress.     Breath sounds: Normal breath sounds. No wheezing.  Neurological:     Mental Status: She is alert and oriented to person, place, and time.     Cranial Nerves: No cranial  nerve deficit.     Coordination: Coordination normal.     Gait: Gait normal.  Psychiatric:        Mood and Affect: Mood normal.        Behavior: Behavior normal.       Assessment/Plan: 1. Obstructive chronic bronchitis without exacerbation ([place]) Refill of breo ordered, spirometry remains normal and unchanged. Patient is doing well.  - fluticasone furoate-vilanterol (BREO ELLIPTA) 100-25 MCG/INH AEPB; Inhale 1 puff into the lungs daily.  Dispense: 60 each; Refill: 5  2. OSA on CPAP Good compliance, daytime sleepiness is improved per patient report. No supplies needed at this time. Last cpap download was in march 2022.    3. SOB (shortness of breath) Arlyce Harman done in office, results were normal and unchanged.  - Spirometry with Graph   General Counseling: Lenna verbalizes understanding of the findings of todays visit and agrees with plan of treatment. I have discussed any further diagnostic evaluation that may be needed or ordered today. We also reviewed her medications today. she has been encouraged to call the office with any questions or concerns that should arise related to todays visit.    Orders Placed This Encounter  Procedures   Spirometry with Graph    Meds ordered this encounter  Medications   fluticasone furoate-vilanterol (BREO ELLIPTA) 100-25 MCG/INH AEPB    Sig: Inhale 1 puff into the lungs daily.    Dispense:  60 each     Refill:  5    Return in about 6 months (around 02/12/2022) for F/U, pulmonary/sleep with DSK or Nikelle Malatesta.   Total time spent:30 Minutes Time spent includes review of chart, medications, test results, and follow up plan with the patient.   [place] Controlled Substance Database was reviewed by me.  This patient was seen by Jonetta Osgood, FNP-C in collaboration with Dr. Clayborn Bigness as a part of collaborative care agreement.   Talli Kimmer R. Valetta Fuller, MSN, FNP-C Internal medicine

## 2021-09-05 ENCOUNTER — Other Ambulatory Visit: Payer: Self-pay

## 2021-09-05 ENCOUNTER — Encounter: Payer: Self-pay | Admitting: Family Medicine

## 2021-09-05 ENCOUNTER — Ambulatory Visit (INDEPENDENT_AMBULATORY_CARE_PROVIDER_SITE_OTHER): Payer: Medicare Other | Admitting: Family Medicine

## 2021-09-05 VITALS — BP 130/80 | HR 68 | Ht 63.0 in | Wt 180.0 lb

## 2021-09-05 DIAGNOSIS — Z23 Encounter for immunization: Secondary | ICD-10-CM

## 2021-09-05 DIAGNOSIS — F320 Major depressive disorder, single episode, mild: Secondary | ICD-10-CM | POA: Diagnosis not present

## 2021-09-05 DIAGNOSIS — J309 Allergic rhinitis, unspecified: Secondary | ICD-10-CM | POA: Diagnosis not present

## 2021-09-05 DIAGNOSIS — F419 Anxiety disorder, unspecified: Secondary | ICD-10-CM | POA: Diagnosis not present

## 2021-09-05 DIAGNOSIS — E785 Hyperlipidemia, unspecified: Secondary | ICD-10-CM | POA: Diagnosis not present

## 2021-09-05 DIAGNOSIS — K219 Gastro-esophageal reflux disease without esophagitis: Secondary | ICD-10-CM | POA: Diagnosis not present

## 2021-09-05 MED ORDER — FLUTICASONE PROPIONATE 50 MCG/ACT NA SUSP: NASAL | 0 refills | Status: DC

## 2021-09-05 MED ORDER — FLUOXETINE HCL 10 MG PO CAPS: 10.0000 mg | ORAL_CAPSULE | Freq: Every day | ORAL | 5 refills | Status: DC

## 2021-09-05 MED ORDER — GEMFIBROZIL 600 MG PO TABS: 600.0000 mg | ORAL_TABLET | Freq: Two times a day (BID) | ORAL | 1 refills | Status: DC

## 2021-09-05 MED ORDER — FLUOXETINE HCL 20 MG PO CAPS: 20.0000 mg | ORAL_CAPSULE | Freq: Every day | ORAL | 5 refills | Status: DC

## 2021-09-05 MED ORDER — PANTOPRAZOLE SODIUM 40 MG PO TBEC: 40.0000 mg | DELAYED_RELEASE_TABLET | Freq: Every day | ORAL | 1 refills | Status: DC

## 2021-09-05 NOTE — Progress Notes (Signed)
Date:  09/05/2021   Name:  Regina Macdonald   DOB:  [birth year]   MRN:  [id number]   Chief Complaint: Flu Vaccine, Allergic Rhinitis , Gastroesophageal Reflux, Hyperlipidemia, and Anxiety  Gastroesophageal Reflux She reports no abdominal pain, no belching, no chest pain, no choking, no coughing, no dysphagia, no heartburn, no hoarse voice, no nausea, no sore throat or no wheezing. This is a chronic problem. The current episode started more than 1 year ago. The problem occurs rarely. The problem has been gradually improving. The symptoms are aggravated by certain foods. Pertinent negatives include no anemia, fatigue, melena, muscle weakness, orthopnea or weight loss. She has tried a PPI for the symptoms. The treatment provided moderate relief.  Hyperlipidemia This is a chronic problem. The current episode started more than 1 year ago. The problem is controlled. Recent lipid tests were reviewed and are normal. She has no history of chronic renal disease, diabetes, hypothyroidism, liver disease, obesity or nephrotic syndrome. Pertinent negatives include no chest pain or shortness of breath. Current antihyperlipidemic treatment includes statins. The current treatment provides moderate improvement of lipids. Risk factors for coronary artery disease include dyslipidemia and hypertension.  Anxiety Presents for follow-up visit. Symptoms include nervous/anxious behavior. Patient reports no chest pain, compulsions, confusion, decreased concentration, depressed mood, dizziness, dry mouth, excessive worry, feeling of choking, hyperventilation, impotence, insomnia, irritability, malaise, muscle tension, nausea, obsessions, palpitations, panic, restlessness, shortness of breath or suicidal ideas. Symptoms occur occasionally. The severity of symptoms is mild.     Lab Results  Component Value Date   CREATININE 0.9 01/24/2021   BUN 11 12/20/2019   NA 140 12/20/2019   K 4.4 12/20/2019   CL 104 12/20/2019    CO2 23 12/20/2019   Lab Results  Component Value Date   CHOL 157 12/20/2019   HDL 48 12/20/2019   LDLCALC 96 12/20/2019   TRIG 66 12/20/2019   CHOLHDL 3.0 08/19/2017   Lab Results  Component Value Date   TSH 3.16 01/24/2021   Lab Results  Component Value Date   HGBA1C 5.7 (H) 12/02/2015   Lab Results  Component Value Date   WBC 7.2 12/20/2019   HGB 11.7 12/20/2019   HCT 34.3 12/20/2019   MCV 90 12/20/2019   PLT 316 12/20/2019   Lab Results  Component Value Date   ALT 23 01/24/2021   AST 14 01/24/2021   ALKPHOS 112 07/31/2019   BILITOT 0.3 07/31/2019     Review of Systems  Constitutional:  Negative for fatigue, irritability and weight loss.  HENT:  Negative for hoarse voice and sore throat.   Respiratory:  Negative for cough, choking, shortness of breath and wheezing.   Cardiovascular:  Negative for chest pain and palpitations.  Gastrointestinal:  Negative for abdominal pain, dysphagia, heartburn, melena and nausea.  Genitourinary:  Negative for impotence.  Musculoskeletal:  Negative for muscle weakness.  Neurological:  Negative for dizziness.  Psychiatric/Behavioral:  Negative for confusion, decreased concentration and suicidal ideas. The patient is nervous/anxious. The patient does not have insomnia.    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   Anxiety 03/23/2018   Taking medication for chronic disease 03/23/2018   Other constipation 12/13/2017   Depression with anxiety 08/19/2017   Hyperlipidemia 08/19/2017   Gastroesophageal reflux disease without esophagitis 08/19/2017   Other chest pain 04/16/2015  COPD exacerbation ([place]) 04/16/2015   Sleep apnea 04/16/2015   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   Asthmatic bronchitis 04/16/2015   Abdominal pain, LLQ 03/29/2014   Rectal bleeding 03/29/2014    Arthritis, 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   Osteoarthrosis, unspecified whether generalized or localized, pelvic region and thigh 03/26/2014    Allergies  Allergen Reactions   Ciprofloxacin Other (See Comments)   Etodolac Other (See Comments)   Sulfa Antibiotics Rash    Past Surgical History:  Procedure Laterality Date   ABDOMINAL HYSTERECTOMY     COLONOSCOPY  06/08/2013   Dr Candace Cruise- small mouth diverticula   HIP SURGERY Left    KNEE SURGERY Bilateral    NASAL RECONSTRUCTION     x 2    Social History   Tobacco Use   Smoking status: Former   Smokeless tobacco: Never  Vaping Use   Vaping Use: Never used  Substance Use Topics   Alcohol use: No    Alcohol/week: 0.0 standard drinks   Drug use: No     Medication list has been reviewed and updated.  Current Meds  Medication Sig   acetaminophen (TYLENOL) 500 MG tablet Take 500 mg by mouth every 6 (six) hours as needed.   aspirin 81 MG chewable tablet Chew by mouth.   CALCIUM & MAGNESIUM CARBONATES PO Take 1 tablet by mouth daily.   EQUATE STOOL SOFTENER 100 MG capsule Take 1 capsule by mouth twice daily   FLUoxetine (PROZAC) 10 MG capsule Take 1 capsule (10 mg total) by mouth daily.   FLUoxetine (PROZAC) 20 MG capsule Take 1 capsule (20 mg total) by mouth daily.   fluticasone (FLONASE) 50 MCG/ACT nasal spray 2 sprays each nostril daily   fluticasone furoate-vilanterol (BREO ELLIPTA) 100-25 MCG/INH AEPB Inhale 1 puff into the lungs daily.   gemfibrozil (LOPID) 600 MG tablet Take 1 tablet (600 mg total) by mouth 2 (two) times daily.   Multiple Vitamins-Minerals (CENTRUM SILVER PO) Take 1 capsule by mouth daily.   pantoprazole (PROTONIX) 40 MG tablet Take 1 tablet (40 mg total) by mouth daily.   polyethylene glycol (MIRALAX) packet Take 17 g by mouth daily.    PHQ 2/9 Scores 09/05/2021 03/06/2021 11/06/2020 10/16/2020  PHQ - 2 Score 0 0 3 3  PHQ- 9 Score 2 0 3 3    GAD 7 : Generalized Anxiety Score 09/05/2021 03/06/2021 11/06/2020  09/05/2020  Nervous, Anxious, on Edge 1 0 0 0  Control/stop worrying 0 0 2 2  Worry too much - different things 0 0 2 2  Trouble relaxing 0 0 0 0  Restless 0 0 0 0  Easily annoyed or irritable 0 0 1 1  Afraid - awful might happen 0 0 0 3  Total GAD 7 Score 1 0 5 8  Anxiety Difficulty Not difficult at all - Not difficult at all Somewhat difficult    BP Readings from Last 3 Encounters:  09/05/21 130/80  08/15/21 (!) 143/65  07/29/21 (!) 176/60    Physical Exam Vitals and nursing note reviewed.  Constitutional:      Appearance: She is well-developed.  HENT:     Head: Normocephalic.     Right Ear: Tympanic membrane, ear canal and external ear normal. There is no impacted cerumen.     Left Ear: Tympanic membrane, ear canal and external ear normal. There is no impacted cerumen.     Nose: Nose normal. No congestion or rhinorrhea.  Eyes:     General: Lids are everted, no foreign  bodies appreciated. No scleral icterus.       Left eye: No foreign body or hordeolum.     Conjunctiva/sclera: Conjunctivae normal.     Right eye: Right conjunctiva is not injected.     Left eye: Left conjunctiva is not injected.     Pupils: Pupils are equal, round, and reactive to light.  Neck:     Thyroid: No thyromegaly.     Vascular: No carotid bruit or JVD.     Trachea: No tracheal deviation.  Cardiovascular:     Rate and Rhythm: Normal rate and regular rhythm.     Heart sounds: Normal heart sounds. No murmur heard.   No friction rub. No gallop.  Pulmonary:     Effort: Pulmonary effort is normal. No respiratory distress.     Breath sounds: Normal breath sounds. No wheezing, rhonchi or rales.  Chest:     Chest wall: No tenderness.  Abdominal:     General: Bowel sounds are normal.     Palpations: Abdomen is soft. There is no mass.     Tenderness: There is no abdominal tenderness. There is no guarding or rebound.  Musculoskeletal:        General: No tenderness. Normal range of motion.      Cervical back: Normal range of motion and neck supple. No rigidity or tenderness.  Lymphadenopathy:     Cervical: No cervical adenopathy.  Skin:    General: Skin is warm.     Findings: No rash.  Neurological:     Mental Status: She is alert and oriented to person, place, and time.     Cranial Nerves: No cranial nerve deficit.  Psychiatric:        Mood and Affect: Mood is not anxious or depressed.    Wt Readings from Last 3 Encounters:  09/05/21 180 lb (81.6 kg)  08/15/21 178 lb 9.6 oz (81 kg)  07/29/21 182 lb 8.7 oz (82.8 kg)    BP 130/80   Pulse 68   Ht 5\' 3"  (1.6 m)   Wt 180 lb (81.6 kg)   BMI 31.89 kg/m   Assessment and Plan:  1. Current mild episode of major depressive disorder, unspecified whether recurrent (HCC) Chronic.  Controlled.  Stable.  PHQ score is 2.  Continue fluoxetine 30 mg once a day. - FLUoxetine (PROZAC) 10 MG capsule; Take 1 capsule (10 mg total) by mouth daily.  Dispense: 30 capsule; Refill: 5 - FLUoxetine (PROZAC) 20 MG capsule; Take 1 capsule (20 mg total) by mouth daily.  Dispense: 30 capsule; Refill: 5  2. Anxiety Chronic.  Controlled.  Stable.  Gad score is 1.  Continue fluoxetine 30 mg once a day. - FLUoxetine (PROZAC) 10 MG capsule; Take 1 capsule (10 mg total) by mouth daily.  Dispense: 30 capsule; Refill: 5 - FLUoxetine (PROZAC) 20 MG capsule; Take 1 capsule (20 mg total) by mouth daily.  Dispense: 30 capsule; Refill: 5  3. Allergic rhinitis Chronic.  Controlled.  Stable.  Continue Flonase nasal spray 2 sprays each nostril daily. - fluticasone (FLONASE) 50 MCG/ACT nasal spray; 2 sprays each nostril daily  Dispense: 16 g; Refill: 0  4. Hyperlipidemia, unspecified hyperlipidemia type Chronic.  Controlled.  Stable.  Continue gemfibrozil 600 mg 1 tablet twice a day.  Will check lipid panel renal panel at this time. - gemfibrozil (LOPID) 600 MG tablet; Take 1 tablet (600 mg total) by mouth 2 (two) times daily.  Dispense: 180 tablet; Refill: 1 -  Lipid Panel With  LDL/HDL Ratio - Renal Function Panel  5. Gastroesophageal reflux disease without esophagitis Chronic.  Controlled.  Stable.  Continue pantoprazole 40 mg once a day. - pantoprazole (PROTONIX) 40 MG tablet; Take 1 tablet (40 mg total) by mouth daily.  Dispense: 90 tablet; Refill: 1  6. Need for immunization against influenza Discussed and administered - Flu Vaccine QUAD High Dose(Fluad)

## 2021-09-06 LAB — RENAL FUNCTION PANEL
Albumin: 4.5 g/dL (ref 3.6–4.6)
BUN/Creatinine Ratio: 20 (ref 12–28)
BUN: 14 mg/dL (ref 8–27)
CO2: 21 mmol/L (ref 20–29)
Calcium: 9.2 mg/dL (ref 8.7–10.3)
Chloride: 104 mmol/L (ref 96–106)
Creatinine, Ser: 0.7 mg/dL (ref 0.57–1.00)
Glucose: 78 mg/dL (ref 70–99)
Phosphorus: 2.8 mg/dL — ABNORMAL LOW (ref 3.0–4.3)
Potassium: 4.3 mmol/L (ref 3.5–5.2)
Sodium: 141 mmol/L (ref 134–144)
eGFR: 86 mL/min/{1.73_m2} (ref >59)

## 2021-09-06 LAB — LIPID PANEL WITH LDL/HDL RATIO
Cholesterol, Total: 138 mg/dL (ref 100–199)
HDL: 38 mg/dL — ABNORMAL LOW (ref >39)
LDL Chol Calc (NIH): 87 mg/dL (ref 0–99)
LDL/HDL Ratio: 2.3 ratio (ref 0.0–3.2)
Triglycerides: 65 mg/dL (ref 0–149)
VLDL Cholesterol Cal: 13 mg/dL (ref 5–40)

## 2021-09-09 DIAGNOSIS — G4733 Obstructive sleep apnea (adult) (pediatric): Secondary | ICD-10-CM | POA: Diagnosis not present

## 2021-09-18 ENCOUNTER — Ambulatory Visit: Payer: Medicare Other | Admitting: Internal Medicine

## 2021-09-18 ENCOUNTER — Encounter: Payer: Self-pay | Admitting: Internal Medicine

## 2021-09-18 ENCOUNTER — Other Ambulatory Visit: Payer: Self-pay

## 2021-09-18 VITALS — BP 168/52 | HR 67 | Temp 98.4°F | Resp 16 | Ht 63.0 in | Wt 176.2 lb

## 2021-09-18 DIAGNOSIS — J986 Disorders of diaphragm: Secondary | ICD-10-CM

## 2021-09-18 DIAGNOSIS — J449 Chronic obstructive pulmonary disease, unspecified: Secondary | ICD-10-CM | POA: Diagnosis not present

## 2021-09-18 DIAGNOSIS — Z9989 Dependence on other enabling machines and devices: Secondary | ICD-10-CM

## 2021-09-18 DIAGNOSIS — G4733 Obstructive sleep apnea (adult) (pediatric): Secondary | ICD-10-CM | POA: Diagnosis not present

## 2021-09-18 NOTE — Patient Instructions (Signed)

## 2021-09-18 NOTE — Progress Notes (Signed)
[facility] [place], [place] [address]  Pulmonary Sleep Medicine   Office Visit Note  Patient Name: Regina Macdonald DOB: 1941-03-10 MRN [id number]  Date of Service: 09/18/2021  Complaints/HPI: COPD/Asthma. She states that she is taking breo seems to be helping her with her breathing and states that she is having less shortness of breath except for when she exerts herself.  Has occasional cough and wheezing noted also.  As far as sleep apnea is concerned patient has been doing fine still with some daytime sleepiness but not as bad  ROS  General: (-) fever, (-) chills, (-) night sweats, (-) weakness Skin: (-) rashes, (-) itching,. Eyes: (-) visual changes, (-) redness, (-) itching. Nose and Sinuses: (-) nasal stuffiness or itchiness, (-) postnasal drip, (-) nosebleeds, (-) sinus trouble. Mouth and Throat: (-) sore throat, (-) hoarseness. Neck: (-) swollen glands, (-) enlarged thyroid, (-) neck pain. Respiratory: - cough, (-) bloody sputum, + shortness of breath, - wheezing. Cardiovascular: - ankle swelling, (-) chest pain. Lymphatic: (-) lymph node enlargement. Neurologic: (-) numbness, (-) tingling. Psychiatric: (-) anxiety, (-) depression   Current Medication: Outpatient Encounter Medications as of 09/18/2021  Medication Sig   acetaminophen (TYLENOL) 500 MG tablet Take 500 mg by mouth every 6 (six) hours as needed.   aspirin 81 MG chewable tablet Chew by mouth.   CALCIUM & MAGNESIUM CARBONATES PO Take 1 tablet by mouth daily.   EQUATE STOOL SOFTENER 100 MG capsule Take 1 capsule by mouth twice daily   FLUoxetine (PROZAC) 10 MG capsule Take 1 capsule (10 mg total) by mouth daily.   FLUoxetine (PROZAC) 20 MG capsule Take 1 capsule (20 mg total) by mouth daily.   fluticasone (FLONASE) 50 MCG/ACT nasal spray 2 sprays each nostril daily   fluticasone furoate-vilanterol (BREO ELLIPTA) 100-25 MCG/INH AEPB Inhale 1 puff into the lungs daily.   gemfibrozil  (LOPID) 600 MG tablet Take 1 tablet (600 mg total) by mouth 2 (two) times daily.   Multiple Vitamins-Minerals (CENTRUM SILVER PO) Take 1 capsule by mouth daily.   pantoprazole (PROTONIX) 40 MG tablet Take 1 tablet (40 mg total) by mouth daily.   polyethylene glycol (MIRALAX) packet Take 17 g by mouth daily.   No facility-administered encounter medications on file as of 09/18/2021.    Surgical History: Past Surgical History:  Procedure Laterality Date   ABDOMINAL HYSTERECTOMY     COLONOSCOPY  06/08/2013   Dr Candace Cruise- small mouth diverticula   HIP SURGERY Left    KNEE SURGERY Bilateral    NASAL RECONSTRUCTION     x 2    Medical History: Past Medical History:  Diagnosis Date   Anxiety    Asthma    Cancer ([place]) cervical-1973   COPD (chronic obstructive pulmonary disease) (HCC)    Depression    Diverticula, colon    Diverticulosis    GERD (gastroesophageal reflux disease)    Sleep apnea     Family History: Family History  Problem Relation Age of Onset   Cirrhosis Father    Coronary artery disease Mother    Breast cancer Neg Hx     Social History: Social History   Socioeconomic History   Marital status: Divorced    Spouse name: Not on file   Number of children: 3   Years of education: Not on file   Highest education level: High school graduate  Occupational History   Occupation: retired  Tobacco Use   Smoking status: Former   Smokeless  tobacco: Never  Vaping Use   Vaping Use: Never used  Substance and Sexual Activity   Alcohol use: No    Alcohol/week: 0.0 standard drinks   Drug use: No   Sexual activity: Not Currently    Birth control/protection: Post-menopausal  Other Topics Concern   Not on file  Social History Narrative   Pt lives with her daughter   Social Determinants of Health   Financial Resource Strain: Low Risk    Difficulty of Paying Living Expenses: Not hard at all  Food Insecurity: No Food Insecurity   Worried About Charity fundraiser in the  Last Year: Never true   Arboriculturist in the Last Year: Never true  Transportation Needs: No Transportation Needs   Lack of Transportation (Medical): No   Lack of Transportation (Non-Medical): No  Physical Activity: Inactive   Days of Exercise per Week: 0 days   Minutes of Exercise per Session: 0 min  Stress: No Stress Concern Present   Feeling of Stress : Only a little  Social Connections: Socially Isolated   Frequency of Communication with Friends and Family: More than three times a week   Frequency of Social Gatherings with Friends and Family: More than three times a week   Attends Religious Services: Never   Marine scientist or Organizations: No   Attends Music therapist: Never   Marital Status: Divorced  Human resources officer Violence: Not At Risk   Fear of Current or Ex-Partner: No   Emotionally Abused: No   Physically Abused: No   Sexually Abused: No    Vital Signs: Blood pressure (!) 168/52, pulse 67, temperature 98.4 F (36.9 C), resp. rate 16, height '5\' 3"'  (1.6 m), weight 176 lb 3.2 oz (79.9 kg), SpO2 96 %.  Examination: General Appearance: The patient is well-developed, well-nourished, and in no distress. Skin: Gross inspection of skin unremarkable. Head: normocephalic, no gross deformities. Eyes: no gross deformities noted. ENT: ears appear grossly normal no exudates. Neck: Supple. No thyromegaly. No LAD. Respiratory: no rhonchi noted at this time. Cardiovascular: Normal S1 and S2 without murmur or rub. Extremities: No cyanosis. pulses are equal. Neurologic: Alert and oriented. No involuntary movements.  LABS: Recent Results (from the past 2160 hour(s))  Lipid Panel With LDL/HDL Ratio     Status: Abnormal   Collection Time: 09/05/21 10:55 AM  Result Value Ref Range   Cholesterol, Total 138 100 - 199 mg/dL   Triglycerides 65 0 - 149 mg/dL   HDL 38 (L) >39 mg/dL   VLDL Cholesterol Cal 13 5 - 40 mg/dL   LDL Chol Calc (NIH) 87 0 - 99 mg/dL    LDL/HDL Ratio 2.3 0.0 - 3.2 ratio    Comment:                                     LDL/HDL Ratio                                             Men  Women                               1/2 Avg.Risk  1.0    1.5  Avg.Risk  3.6    3.2                                2X Avg.Risk  6.2    5.0                                3X Avg.Risk  8.0    6.1   Renal Function Panel     Status: Abnormal   Collection Time: 09/05/21 10:55 AM  Result Value Ref Range   Glucose 78 70 - 99 mg/dL   BUN 14 8 - 27 mg/dL   Creatinine, Ser 0.70 0.57 - 1.00 mg/dL   eGFR 86 >59 mL/min/1.73   BUN/Creatinine Ratio 20 12 - 28   Sodium 141 134 - 144 mmol/L   Potassium 4.3 3.5 - 5.2 mmol/L   Chloride 104 96 - 106 mmol/L   CO2 21 20 - 29 mmol/L   Calcium 9.2 8.7 - 10.3 mg/dL   Phosphorus 2.8 (L) 3.0 - 4.3 mg/dL   Albumin 4.5 3.6 - 4.6 g/dL    Radiology: No results found.  No results found.  No results found.    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 Obstructive chronic bronchitis without exacerbation ([place]) Continue with medical management the patient has been on inhaler therapy which will be  continued  2. OSA on CPAP Continue with CPAP. CPAP Counseling: had a lengthy discussion with the patient regarding the importance of PAP therapy in management of the sleep apnea. Patient appears to understand the risk factor reduction and also understands the risks associated with untreated sleep apnea. Patient will try to make a good faith effort to remain compliant with therapy. Also instructed the patient on proper cleaning of the device including the water must be changed daily if possible and use of distilled water is preferred. Patient understands that the machine should be regularly cleaned with appropriate recommended cleaning solutions that do not damage the PAP machine for example given white vinegar and water rinses. Other methods such as ozone treatment may not be as good as these simple methods to achieve cleaning.   3. Morbid obesity ([place]) Obesity Counseling: Had a lengthy discussion regarding patients BMI and weight issues. Patient was instructed on portion control as well as increased activity. Also discussed caloric restrictions with trying to maintain intake less than 2000 Kcal. Discussions were made in accordance with the 5As of weight management. Simple actions such as not eating late and if able to, taking a walk is suggested.   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  I have also discussed any further diagnostic evaluation thatmay be needed or ordered today. Venecia verbalizes understanding of the findings of todays visit. We also reviewed her medications today and discussed drug interactions and side effects including but not limited excessive drowsiness and altered mental states. We also discussed that there is always a risk not just to her but also people around her. she has been encouraged to call the  office with any questions or concerns that should arise related to todays visit.  No orders of the defined types were placed in this encounter.    Time spent: 106  I have personally obtained a history, examined the patient, evaluated laboratory and imaging results, formulated the assessment and plan and placed orders.    [name], MD [facility] Sleep medicine

## 2021-10-22 ENCOUNTER — Ambulatory Visit (INDEPENDENT_AMBULATORY_CARE_PROVIDER_SITE_OTHER): Payer: Medicare Other

## 2021-10-22 DIAGNOSIS — Z0000 Encounter for general adult medical examination without abnormal findings: Secondary | ICD-10-CM | POA: Diagnosis not present

## 2021-10-22 NOTE — Patient Instructions (Signed)
Ms. Regina Macdonald , Thank you for taking time to come for your Medicare Wellness Visit. I appreciate your ongoing commitment to your health goals. Please review the following plan we discussed and let me know if I can assist you in the future.   Screening recommendations/referrals: Colonoscopy: no longer required Mammogram: done 05/14/21 Bone Density: done 03/19/15 Recommended yearly ophthalmology/optometry visit for glaucoma screening and checkup Recommended yearly dental visit for hygiene and checkup  Vaccinations: Influenza vaccine: done 09/05/21 Pneumococcal vaccine: done 07/29/21 Tdap vaccine: done 07/29/21 Shingles vaccine: Shingrix discussed. Please contact your pharmacy for coverage information.  Covid-19:done 01/17/20, 02/07/20 & 10/11/20  Advanced directives: Please bring a copy of your health care power of attorney and living will to the office at your convenience.   Conditions/risks identified: Recommend increasing physical activity   Next appointment: Follow up in one year for your annual wellness visit   Preventive Care 65 Years and Older, Female Preventive care refers to lifestyle choices and visits with your health care provider that can promote health and wellness. What does preventive care include? A yearly physical exam. This is also called an annual well check. Dental exams once or twice a year. Routine eye exams. Ask your health care provider how often you should have your eyes checked. Personal lifestyle choices, including: Daily care of your teeth and gums. Regular physical activity. Eating a healthy diet. Avoiding tobacco and drug use. Limiting alcohol use. Practicing safe sex. Taking low-dose aspirin every day. Taking vitamin and mineral supplements as recommended by your health care provider. What happens during an annual well check? The services and screenings done by your health care provider during your annual well check will depend on your age, overall health,  lifestyle risk factors, and family history of disease. Counseling  Your health care provider may ask you questions about your: Alcohol use. Tobacco use. Drug use. Emotional well-being. Home and relationship well-being. Sexual activity. Eating habits. History of falls. Memory and ability to understand (cognition). Work and work Statistician. Reproductive health. Screening  You may have the following tests or measurements: Height, weight, and BMI. Blood pressure. Lipid and cholesterol levels. These may be checked every 5 years, or more frequently if you are over 71 years old. Skin check. Lung cancer screening. You may have this screening every year starting at age 16 if you have a 30-pack-year history of smoking and currently smoke or have quit within the past 15 years. Fecal occult blood test (FOBT) of the stool. You may have this test every year starting at age 33. Flexible sigmoidoscopy or colonoscopy. You may have a sigmoidoscopy every 5 years or a colonoscopy every 10 years starting at age 41. Hepatitis C blood test. Hepatitis B blood test. Sexually transmitted disease (STD) testing. Diabetes screening. This is done by checking your blood sugar (glucose) after you have not eaten for a while (fasting). You may have this done every 1-3 years. Bone density scan. This is done to screen for osteoporosis. You may have this done starting at age 18. Mammogram. This may be done every 1-2 years. Talk to your health care provider about how often you should have regular mammograms. Talk with your health care provider about your test results, treatment options, and if necessary, the need for more tests. Vaccines  Your health care provider may recommend certain vaccines, such as: Influenza vaccine. This is recommended every year. Tetanus, diphtheria, and acellular pertussis (Tdap, Td) vaccine. You may need a Td booster every 10 years. Zoster vaccine. You  may need this after age  76. Pneumococcal 13-valent conjugate (PCV13) vaccine. One dose is recommended after age 63. Pneumococcal polysaccharide (PPSV23) vaccine. One dose is recommended after age 2. Talk to your health care provider about which screenings and vaccines you need and how often you need them. This information is not intended to replace advice given to you by your health care provider. Make sure you discuss any questions you have with your health care provider. Document Released: 11/29/2015 Document Revised: 07/22/2016 Document Reviewed: 09/03/2015 Elsevier Interactive Patient Education  2017 [place] Prevention in the Home Falls can cause injuries. They can happen to people of all ages. There are many things you can do to make your home safe and to help prevent falls. What can I do on the outside of my home? Regularly fix the edges of walkways and driveways and fix any cracks. Remove anything that might make you trip as you walk through a door, such as a raised step or threshold. Trim any bushes or trees on the path to your home. Use bright outdoor lighting. Clear any walking paths of anything that might make someone trip, such as rocks or tools. Regularly check to see if handrails are loose or broken. Make sure that both sides of any steps have handrails. Any raised decks and porches should have guardrails on the edges. Have any leaves, snow, or ice cleared regularly. Use sand or salt on walking paths during winter. Clean up any spills in your garage right away. This includes oil or grease spills. What can I do in the bathroom? Use night lights. Install grab bars by the toilet and in the tub and shower. Do not use towel bars as grab bars. Use non-skid mats or decals in the tub or shower. If you need to sit down in the shower, use a plastic, non-slip stool. Keep the floor dry. Clean up any water that spills on the floor as soon as it happens. Remove soap buildup in the tub or shower  regularly. Attach bath mats securely with double-sided non-slip rug tape. Do not have throw rugs and other things on the floor that can make you trip. What can I do in the bedroom? Use night lights. Make sure that you have a light by your bed that is easy to reach. Do not use any sheets or blankets that are too big for your bed. They should not hang down onto the floor. Have a firm chair that has side arms. You can use this for support while you get dressed. Do not have throw rugs and other things on the floor that can make you trip. What can I do in the kitchen? Clean up any spills right away. Avoid walking on wet floors. Keep items that you use a lot in easy-to-reach places. If you need to reach something above you, use a strong step stool that has a grab bar. Keep electrical cords out of the way. Do not use floor polish or wax that makes floors slippery. If you must use wax, use non-skid floor wax. Do not have throw rugs and other things on the floor that can make you trip. What can I do with my stairs? Do not leave any items on the stairs. Make sure that there are handrails on both sides of the stairs and use them. Fix handrails that are broken or loose. Make sure that handrails are as long as the stairways. Check any carpeting to make sure that it is firmly  attached to the stairs. Fix any carpet that is loose or worn. Avoid having throw rugs at the top or bottom of the stairs. If you do have throw rugs, attach them to the floor with carpet tape. Make sure that you have a light switch at the top of the stairs and the bottom of the stairs. If you do not have them, ask someone to add them for you. What else can I do to help prevent falls? Wear shoes that: Do not have high heels. Have rubber bottoms. Are comfortable and fit you well. Are closed at the toe. Do not wear sandals. If you use a stepladder: Make sure that it is fully opened. Do not climb a closed stepladder. Make sure that  both sides of the stepladder are locked into place. Ask someone to hold it for you, if possible. Clearly mark and make sure that you can see: Any grab bars or handrails. First and last steps. Where the edge of each step is. Use tools that help you move around (mobility aids) if they are needed. These include: Canes. Walkers. Scooters. Crutches. Turn on the lights when you go into a dark area. Replace any light bulbs as soon as they burn out. Set up your furniture so you have a clear path. Avoid moving your furniture around. If any of your floors are uneven, fix them. If there are any pets around you, be aware of where they are. Review your medicines with your doctor. Some medicines can make you feel dizzy. This can increase your chance of falling. Ask your doctor what other things that you can do to help prevent falls. This information is not intended to replace advice given to you by your health care provider. Make sure you discuss any questions you have with your health care provider. Document Released: 08/29/2009 Document Revised: 04/09/2016 Document Reviewed: 12/07/2014 Elsevier Interactive Patient Education  2017 [employer].

## 2021-10-22 NOTE — Progress Notes (Signed)
Subjective:   Regina Macdonald is a 80 y.o. female who presents for Medicare Annual (Subsequent) preventive examination.  Virtual Visit via Telephone Note  I connected with  Regina Macdonald on 10/22/21 at 11:20 AM EST by telephone and verified that I am speaking with the correct person using two identifiers.  Location: Patient: home Provider: [facility] Persons participating in the virtual visit: Regina Macdonald   I discussed the limitations, risks, security and privacy concerns of performing an evaluation and management service by telephone and the availability of in person appointments. The patient expressed understanding and agreed to proceed.  Interactive audio and video telecommunications were attempted between this nurse and patient, however failed, due to patient having technical difficulties OR patient did not have access to video capability.  We continued and completed visit with audio only.  Some vital signs may be absent or patient reported.   Regina Marker, LPN   Review of Systems     Cardiac Risk Factors include: advanced age (>39men, >51 women);dyslipidemia     Objective:    There were no vitals filed for this visit. There is no height or weight on file to calculate BMI.  Advanced Directives 10/22/2021 07/29/2021 10/16/2020 10/16/2019 09/28/2018 08/16/2017 07/20/2017  Does Patient Have a Medical Advance Directive? Yes No No No No Yes Yes  Type of Paramedic of [place];Living will - - - - Healthcare Power of [place] in Chart? No - copy requested - - - - No - copy requested Yes  Would patient like information on creating a medical advance directive? - No - Patient declined No - Patient declined No - Patient declined Yes (MAU/Ambulatory/Procedural Areas - Information given) - -    Current Medications (verified) Outpatient Encounter Medications as of 10/22/2021  Medication  Sig   acetaminophen (TYLENOL) 500 MG tablet Take 500 mg by mouth every 6 (six) hours as needed.   aspirin 81 MG chewable tablet Chew by mouth.   CALCIUM & MAGNESIUM CARBONATES PO Take 1 tablet by mouth daily.   EQUATE STOOL SOFTENER 100 MG capsule Take 1 capsule by mouth twice daily   FLUoxetine (PROZAC) 10 MG capsule Take 1 capsule (10 mg total) by mouth daily.   FLUoxetine (PROZAC) 20 MG capsule Take 1 capsule (20 mg total) by mouth daily.   fluticasone (FLONASE) 50 MCG/ACT nasal spray 2 sprays each nostril daily   fluticasone furoate-vilanterol (BREO ELLIPTA) 100-25 MCG/INH AEPB Inhale 1 puff into the lungs daily.   gemfibrozil (LOPID) 600 MG tablet Take 1 tablet (600 mg total) by mouth 2 (two) times daily.   loratadine (CLARITIN) 10 MG tablet Take 10 mg by mouth daily.   Multiple Vitamins-Minerals (CENTRUM SILVER PO) Take 1 capsule by mouth daily.   pantoprazole (PROTONIX) 40 MG tablet Take 1 tablet (40 mg total) by mouth daily.   polyethylene glycol (MIRALAX) packet Take 17 g by mouth daily.   No facility-administered encounter medications on file as of 10/22/2021.    Allergies (verified) Ciprofloxacin, Etodolac, and Sulfa antibiotics   History: Past Medical History:  Diagnosis Date   Anxiety    Asthma    Cancer ([place]) cervical-1973   COPD (chronic obstructive pulmonary disease) (HCC)    Depression    Diverticula, colon    Diverticulosis    GERD (gastroesophageal reflux disease)    Sleep apnea    Past Surgical History:  Procedure Laterality Date   ABDOMINAL  HYSTERECTOMY     COLONOSCOPY  06/08/2013   Dr Candace Cruise- small mouth diverticula   HIP SURGERY Left    KNEE SURGERY Bilateral    NASAL RECONSTRUCTION     x 2   Family History  Problem Relation Age of Onset   Cirrhosis Father    Coronary artery disease Mother    Breast cancer Neg Hx    Social History   Socioeconomic History   Marital status: Divorced    Spouse name: Not on file   Number of children: 3   Years of  education: Not on file   Highest education level: High school graduate  Occupational History   Occupation: retired  Tobacco Use   Smoking status: Former   Smokeless tobacco: Never  Scientific laboratory technician Use: Never used  Substance and Sexual Activity   Alcohol use: No    Alcohol/week: 0.0 standard drinks   Drug use: No   Sexual activity: Not Currently    Birth control/protection: Post-menopausal  Other Topics Concern   Not on file  Social History Narrative   Pt lives with her daughter   Social Determinants of Health   Financial Resource Strain: Low Risk    Difficulty of Paying Living Expenses: Not hard at all  Food Insecurity: No Food Insecurity   Worried About Charity fundraiser in the Last Year: Never true   Arboriculturist in the Last Year: Never true  Transportation Needs: No Transportation Needs   Lack of Transportation (Medical): No   Lack of Transportation (Non-Medical): No  Physical Activity: Inactive   Days of Exercise per Week: 0 days   Minutes of Exercise per Session: 0 min  Stress: No Stress Concern Present   Feeling of Stress : Not at all  Social Connections: Socially Isolated   Frequency of Communication with Friends and Family: More than three times a week   Frequency of Social Gatherings with Friends and Family: More than three times a week   Attends Religious Services: Never   Marine scientist or Organizations: No   Attends Music therapist: Never   Marital Status: Divorced    Tobacco Counseling Counseling given: Not Answered   Clinical Intake:  Pre-visit preparation completed: Yes  Pain : No/denies pain     Nutritional Risks: None Diabetes: No  How often do you need to have someone help you when you read instructions, pamphlets, or other written materials from your doctor or pharmacy?: 1 - Never    Interpreter Needed?: No  Information entered by :: Regina Marker LPN   Activities of Daily Living In your present state  of health, do you have any difficulty performing the following activities: 10/22/2021  Hearing? Y  Vision? N  Difficulty concentrating or making decisions? N  Walking or climbing stairs? N  Dressing or bathing? N  Doing errands, shopping? N  Preparing Food and eating ? N  Using the Toilet? N  In the past six months, have you accidently leaked urine? N  Do you have problems with loss of bowel control? N  Managing your Medications? N  Managing your Finances? N  Housekeeping or managing your Housekeeping? N  Some recent data might be hidden    Patient Care Team: Juline Patch, MD as PCP - General (Family Medicine) [name], MD as Consulting Physician (Internal Medicine)  Indicate any recent Medical Services you may have received from other than [facility] providers in the past year (  date may be approximate).     Assessment:   This is a routine wellness examination for [place].  Hearing/Vision screen Hearing Screening - Comments:: Pt c/o mild hearing difficulty; declines hearing aids Vision Screening - Comments:: Annual vision screenings at [place] issues and exercise activities discussed: Current Exercise Habits: The patient does not participate in regular exercise at present, Exercise limited by: None identified   Goals Addressed             This Visit's Progress    DIET - INCREASE WATER INTAKE   Not on track    Recommend drinking 6-8 glasses of water per day       Depression Screen PHQ 2/9 Scores 10/22/2021 09/05/2021 03/06/2021 11/06/2020 10/16/2020 09/05/2020 04/02/2020  PHQ - 2 Score 1 0 0 3 3 4  0  PHQ- 9 Score 2 2 0 3 3 4  0    Fall Risk Fall Risk  10/22/2021 09/05/2021 11/06/2020 10/16/2020 09/05/2020  Falls in the past year? 1 0 1 1 1   Number falls in past yr: 1 1 1  0 0  Injury with Fall? 0 0 0 0 0  Risk for fall due to : No Fall Risks No Fall Risks History of fall(s) History of fall(s) -  Follow up Falls prevention discussed Falls  evaluation completed Falls evaluation completed Falls prevention discussed Falls evaluation completed    FALL RISK PREVENTION PERTAINING TO THE HOME:  Any stairs in or around the home? No  If so, are there any without handrails? No  Home free of loose throw rugs in walkways, pet beds, electrical cords, etc? Yes  Adequate lighting in your home to reduce risk of falls? Yes   ASSISTIVE DEVICES UTILIZED TO PREVENT FALLS:  Life alert? No  Use of a cane, walker or w/c? No  Grab bars in the bathroom? No  Shower chair or bench in shower? No  Elevated toilet seat or a handicapped toilet? No   TIMED UP AND GO:  Was the test performed? No . Telephonic visit.   Cognitive Function: Normal cognitive status assessed by direct observation by this Nurse Health Advisor. No abnormalities found.       6CIT Screen 10/16/2020 10/16/2019 09/28/2018 08/16/2017  What Year? 0 points 0 points 0 points 0 points  What month? 0 points 0 points 0 points 0 points  What time? 0 points 0 points 0 points 0 points  Count back from 20 0 points 0 points 0 points 0 points  Months in reverse 0 points 0 points 0 points 0 points  Repeat phrase 0 points 0 points 2 points 2 points  Total Score 0 0 2 2    Immunizations Immunization History  Administered Date(s) Administered   Fluad Quad(high Dose 65+) 07/31/2019, 09/05/2021   Influenza Inj Mdck Quad Pf 08/19/2020   Influenza, High Dose Seasonal PF 08/16/2017, 08/07/2018   Influenza,inj,Quad PF,6+ Mos 09/09/2015   Influenza-Unspecified 07/24/2014, 08/07/2018, 08/26/2020   PFIZER(Purple Top)SARS-COV-2 Vaccination 01/17/2020, 02/07/2020, 10/11/2020   Pneumococcal Conjugate-13 09/09/2015   Pneumococcal Polysaccharide-23 02/09/2013   Tdap 08/10/2011, 07/29/2021   Zoster, Live 02/27/2015    TDAP status: Up to date  Flu Vaccine status: Up to date  Pneumococcal vaccine status: Up to date  Covid-19 vaccine status: Completed vaccines  Qualifies for Shingles  Vaccine? Yes   Zostavax completed Yes   Shingrix Completed?: No.    Education has been provided regarding the importance of this vaccine. Patient has been advised to call insurance company to  determine out of pocket expense if they have not yet received this vaccine. Advised may also receive vaccine at local pharmacy or Health Dept. Verbalized acceptance and understanding.  Screening Tests Health Maintenance  Topic Date Due   COVID-19 Vaccine (4 - Booster for Pfizer series) 12/06/2020   Zoster Vaccines- Shingrix (1 of 2) 12/06/2021 (Originally [birth year]1989)   MAMMOGRAM  05/14/2022   TETANUS/TDAP  07/30/2031   Pneumonia Vaccine 62+ Years old  Completed   INFLUENZA VACCINE  Completed   DEXA SCAN  Completed   HPV VACCINES  Aged Out    Health Maintenance  Health Maintenance Due  Topic Date Due   COVID-19 Vaccine (4 - Booster for [place] series) 12/06/2020    Colorectal cancer screening: No longer required.   Mammogram status: Completed 05/14/21. Repeat every year  Bone Density status: Completed 03/19/15. Results reflect: Bone density results: OSTEOPENIA. Repeat every 2 years. Pt to discuss with Dr. Ronnald Ramp for repeat screening.   Lung Cancer Screening: (Low Dose CT Chest recommended if Age 46-80 years, 30 pack-year currently smoking OR have quit w/in 15years.) does not qualify.   Additional Screening:  Hepatitis C Screening: does not qualify.   Vision Screening: Recommended annual ophthalmology exams for early detection of glaucoma and other disorders of the eye. Is the patient up to date with their annual eye exam?  Yes  Who is the provider or what is the name of the office in which the patient attends annual eye exams? [facility].   Dental Screening: Recommended annual dental exams for proper oral hygiene  Community Resource Referral / Chronic Care Management: CRR required this visit?  No   CCM required this visit?  No      Plan:     I have personally reviewed and noted the  following in the patient's chart:   Medical and social history Use of alcohol, tobacco or illicit drugs  Current medications and supplements including opioid prescriptions.  Functional ability and status Nutritional status Physical activity Advanced directives List of other physicians Hospitalizations, surgeries, and ER visits in previous 12 months Vitals Screenings to include cognitive, depression, and falls Referrals and appointments  In addition, I have reviewed and discussed with patient certain preventive protocols, quality metrics, and best practice recommendations. A written personalized care plan for preventive services as well as general preventive health recommendations were provided to patient.     Regina Marker, LPN   [id number]/[id number]   Nurse Notes: none

## 2021-11-02 ENCOUNTER — Other Ambulatory Visit: Payer: Self-pay | Admitting: Family Medicine

## 2021-11-02 DIAGNOSIS — J309 Allergic rhinitis, unspecified: Secondary | ICD-10-CM

## 2021-11-05 ENCOUNTER — Ambulatory Visit (INDEPENDENT_AMBULATORY_CARE_PROVIDER_SITE_OTHER): Payer: Medicare Other

## 2021-11-05 ENCOUNTER — Ambulatory Visit
Admission: EM | Admit: 2021-11-05 | Discharge: 2021-11-05 | Disposition: A | Discharge status: Home / Self Care | Payer: Medicare Other | Attending: Emergency Medicine | Admitting: Emergency Medicine

## 2021-11-05 ENCOUNTER — Other Ambulatory Visit: Payer: Self-pay

## 2021-11-05 DIAGNOSIS — S82831A Other fracture of upper and lower end of right fibula, initial encounter for closed fracture: Secondary | ICD-10-CM

## 2021-11-05 DIAGNOSIS — M25571 Pain in right ankle and joints of right foot: Secondary | ICD-10-CM

## 2021-11-05 DIAGNOSIS — W19XXXA Unspecified fall, initial encounter: Secondary | ICD-10-CM | POA: Diagnosis not present

## 2021-11-05 DIAGNOSIS — M7989 Other specified soft tissue disorders: Secondary | ICD-10-CM | POA: Diagnosis not present

## 2021-11-05 NOTE — Discharge Instructions (Addendum)
Wear the walking boot whenever you are up and mobile.  This will protect your ankle and prevent further injury.  Keep your right ankle elevated as much as possible to help minimize potential for additional injury and to help decrease swelling which will aid in wound healing.  You can apply ice to your ankle for 20 minutes at a time 2-3 times a day to help with pain and swelling.  Use over-the-counter Tylenol and ibuprofen according to the package instructions as needed for pain.  Since you have seen [facility] orthopedics in the past I recommend calling their office tomorrow morning to arrange a follow-up appointment to be evaluated.  You may wind up being placed in a cast or you may be allowed to heal in the walking boot but that will be up to an orthopedic specialist.

## 2021-11-05 NOTE — ED Triage Notes (Signed)
Pt stepped out of car and ground was even, twisted R ankle   No change in movement or sensation to toes but significant swelling in ankle.  Did not fall.

## 2021-11-05 NOTE — ED Provider Notes (Signed)
[facility]    CSN: [id number] Arrival date & time: 11/05/21  1824      History   Chief Complaint Chief Complaint  Patient presents with   Ankle Pain    R    HPI Regina Macdonald is a 80 y.o. female.   HPI  80 year old female here for evaluation of right ankle pain.  Patient reports that she was stepping out of car, twisted her ankle, and fell.  She denies any head injury and states that she was able to get herself up unassisted.  She is able to bear weight but she has pain on the outside of her left ankle.  There is also significant swelling and bruising.  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   Anxiety 03/23/2018   Taking medication for chronic disease 03/23/2018   Other constipation 12/13/2017   Depression with anxiety 08/19/2017   Hyperlipidemia 08/19/2017   Gastroesophageal reflux disease without esophagitis 08/19/2017   Other chest pain 04/16/2015   COPD exacerbation ([place]) 04/16/2015   Sleep apnea 04/16/2015   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   Asthmatic bronchitis 04/16/2015   Abdominal pain, LLQ 03/29/2014   Rectal bleeding 03/29/2014   Arthritis, 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   Osteoarthrosis, unspecified whether generalized or localized, pelvic region and thigh 03/26/2014    Past Surgical History:  Procedure Laterality Date   ABDOMINAL HYSTERECTOMY     COLONOSCOPY  06/08/2013   Dr Candace Cruise- small mouth diverticula   HIP SURGERY Left    KNEE SURGERY Bilateral    NASAL RECONSTRUCTION     x 2    OB History   No obstetric  history on file.      Home Medications    Prior to Admission medications   Medication Sig Start Date End Date Taking? Authorizing Provider  acetaminophen (TYLENOL) 500 MG tablet Take 500 mg by mouth every 6 (six) hours as needed.    [provider]  aspirin 81 MG chewable tablet Chew by mouth.    [provider]  CALCIUM & MAGNESIUM CARBONATES PO Take 1 tablet by mouth daily.    [provider]  EQUATE STOOL SOFTENER 100 MG capsule Take 1 capsule by mouth twice daily 03/24/19   Juline Patch, MD  FLUoxetine (PROZAC) 10 MG capsule Take 1 capsule (10 mg total) by mouth daily. 09/05/21   Juline Patch, MD  FLUoxetine (PROZAC) 20 MG capsule Take 1 capsule (20 mg total) by mouth daily. 09/05/21   Juline Patch, MD  fluticasone (FLONASE) 50 MCG/ACT nasal spray Use 2 spray(s) in each nostril once daily 11/02/21   Juline Patch, MD  fluticasone furoate-vilanterol (BREO ELLIPTA) 100-25 MCG/INH AEPB Inhale 1 puff into the lungs daily. 08/15/21   Jonetta Osgood, NP  gemfibrozil (LOPID) 600 MG tablet Take 1 tablet (600 mg total) by mouth 2 (two) times daily. 09/05/21   Juline Patch, MD  loratadine (CLARITIN) 10 MG tablet Take 10 mg by mouth daily. 10/01/21   [provider]  Multiple Vitamins-Minerals (CENTRUM SILVER PO) Take 1 capsule by mouth daily.    [provider]  pantoprazole (PROTONIX) 40 MG tablet Take 1 tablet (40 mg total) by mouth daily. 09/05/21   Juline Patch, MD  polyethylene glycol [facility]) packet Take 17 g by mouth daily. 07/20/17   Schuyler Amor, MD    Family History Family History  Problem Relation Age of Onset   Cirrhosis Father    Coronary artery disease Mother    Breast cancer Neg Hx     Social History Social History   Tobacco Use   Smoking status: Former   Smokeless tobacco: Never  Scientific laboratory technician Use: Never used  Substance Use Topics   Alcohol use: No    Alcohol/week: 0.0 standard drinks   Drug  use: No     Allergies   Ciprofloxacin, Etodolac, and Sulfa antibiotics   Review of Systems Review of Systems  Constitutional:  Negative for activity change, appetite change and fever.  Musculoskeletal:  Positive for arthralgias and joint swelling. Negative for myalgias.  Skin:  Positive for color change.  Neurological:  Negative for weakness and numbness.  Hematological: Negative.   Psychiatric/Behavioral: Negative.      Physical Exam Triage Vital Signs ED Triage Vitals  Enc Vitals Group     BP 11/05/21 1952 (!) 171/69     Pulse Rate 11/05/21 1952 60     Resp 11/05/21 1952 18     Temp 11/05/21 1952 98.5 F (36.9 C)     Temp Source 11/05/21 1952 Oral     SpO2 11/05/21 1952 97 %     Weight --      Height --      Head Circumference --      Peak Flow --      Pain Score 11/05/21 1947 7     Pain Loc --      Pain Edu? --      Excl. in [place]? --    No data found.  Updated Vital Signs BP (!) 171/69 (BP Location: Right Arm)    Pulse 60    Temp 98.5 F (36.9 C) (Oral)    Resp 18    SpO2 97%   Visual Acuity Right Eye Distance:   Left Eye Distance:   Bilateral Distance:    Right Eye Near:   Left Eye Near:    Bilateral Near:     Physical Exam Vitals and nursing note reviewed.  Constitutional:      General: She is not in acute distress.    Appearance: Normal appearance. She is normal weight. She is not ill-appearing.  HENT:     Head: Normocephalic and atraumatic.  Musculoskeletal:        General: Swelling and tenderness present. Normal range of motion.  Skin:    General: Skin is warm and dry.     Capillary Refill: Capillary refill takes less than 2 seconds.     Findings: Bruising and erythema present.  Neurological:     General: No focal deficit present.     Mental Status: She is alert and oriented to person, place, and time.     Sensory: No sensory deficit.     Motor: No weakness.  Psychiatric:        Mood and Affect: Mood normal.        Behavior: Behavior  normal.        Thought Content: Thought content normal.  Judgment: Judgment normal.     UC Treatments / Results  Labs (all labs ordered are listed, but only abnormal results are displayed) Labs Reviewed - No data to display  EKG   Radiology No results found.  Procedures Procedures (including critical care time)  Medications Ordered in UC Medications - No data to display  Initial Impression / Assessment and Plan / UC Course  I have reviewed the triage vital signs and the nursing notes.  Pertinent labs & imaging results that were available during my care of the patient were reviewed by me and considered in my medical decision making (see chart for details).  Patient is a nontoxic-appearing 80 year old female here for evaluation of right ankle pain and swelling that began today after she rolled her ankle getting out of the car.  Patient is able to bear weight but indicates that it does cause pain.  She indicates that the pain is all on the lateral aspect of her ankle.  She has full range of motion of her foot and toes with full sensation.  There is no tenderness with palpation of the midfoot, medial malleolus, calcaneus, or Achilles.  All the tenderness is with palpation of the distal and posterior aspect of the lateral malleolus as well as the anterior and inferior soft tissue space below the lateral malleolus.  There is significant edema and ecchymosis noted as well.  DP and PT pulses are 2+ in that extremity.  We will obtain radiograph to look for bony derangement.  Right ankle films independently reviewed and evaluated by me.  Impression: There is a nondisplaced distal fibular fracture.  The talus and motor joint are well maintained.  Radiology overread is pending.     Final Clinical Impressions(s) / UC Diagnoses   Final diagnoses:  Closed fracture of distal end of right fibula, unspecified fracture morphology, initial encounter     Discharge Instructions      Wear  the walking boot whenever you are up and mobile.  This will protect your ankle and prevent further injury.  Keep your right ankle elevated as much as possible to help minimize potential for additional injury and to help decrease swelling which will aid in wound healing.  You can apply ice to your ankle for 20 minutes at a time 2-3 times a day to help with pain and swelling.  Use over-the-counter Tylenol and ibuprofen according to the package instructions as needed for pain.  Since you have seen [facility] clinic orthopedics in the past I recommend calling their office tomorrow morning to arrange a follow-up appointment to be evaluated.  You may wind up being placed in a cast or you may be allowed to heal in the walking boot but that will be up to an orthopedic specialist.     ED Prescriptions   None    PDMP not reviewed this encounter.   Margarette Canada, NP 11/05/21 2020

## 2021-11-06 ENCOUNTER — Telehealth: Payer: Self-pay

## 2021-11-06 NOTE — Telephone Encounter (Signed)
Copied from [place] [phone]. Topic: Referral - Request for Referral >> Nov 06, 2021  9:26 AM Yvette Rack wrote: Has patient seen PCP for this complaint? No. *If NO, is insurance requiring patient see PCP for this issue before PCP can refer them? Referral for which specialty: ortho Preferred provider/office: no specific provider Reason for referral: broken ankle

## 2021-11-07 ENCOUNTER — Ambulatory Visit (INDEPENDENT_AMBULATORY_CARE_PROVIDER_SITE_OTHER): Payer: Medicare Other | Admitting: Family Medicine

## 2021-11-07 ENCOUNTER — Encounter: Payer: Self-pay | Admitting: Family Medicine

## 2021-11-07 ENCOUNTER — Other Ambulatory Visit: Payer: Self-pay

## 2021-11-07 VITALS — BP 130/60 | HR 72 | Ht 63.0 in | Wt 176.0 lb

## 2021-11-07 DIAGNOSIS — S8264XA Nondisplaced fracture of lateral malleolus of right fibula, initial encounter for closed fracture: Secondary | ICD-10-CM

## 2021-11-07 MED ORDER — VITAMIN D (ERGOCALCIFEROL) 1.25 MG (50000 UNIT) PO CAPS: 50000.0000 [IU] | ORAL_CAPSULE | ORAL | 0 refills | Status: DC

## 2021-11-07 NOTE — Assessment & Plan Note (Signed)
Pleasant 80-year-old female presents after traumatic ankle inversion injury resulting in lateral ankle pain, was seen and evaluated by [facility] and found to have a nondisplaced lateral malleolus fracture, was advised further evaluation and management.  Examination today reveals mild swelling to the lateral right ankle, mild ecchymosis, no gross deformity noted.  She is able to independently fire all motor units and notes pain during active inversion, nontender at the fibular head and fifth metatarsal base, medially at the deltoid ligament or other bony prominences, she is focally tender at the lateral malleolus and at the PT FL, laxity examination limited due to acuity of symptoms.  Given her clinical history, radiographic features, and overall reassuring physical examination findings today, I have advised cam boot versus cast, she states that she is amenable to cam boot, I have expressed the importance of using the boot constantly as it for a cast, elevating the foot/ankle, perform gentle toe motion to minimize risk for DVT, and we will initiate Rx vitamin D weekly x8 weeks.  Activity modifications reviewed and she is to not push through pain while weightbearing with cane assistance.  She is to return for follow-up in 4 weeks, plan for repeat x-rays out of the boot prior to our visit.  The patient and her son were advised to contact us for any questions between now and then

## 2021-11-07 NOTE — Patient Instructions (Addendum)
-   Wear fracture boot throughout the day at night as it for a cast - Okay to remove briefly for icing and bathing (use shower chair while bathing) - Can perform gentle weightbearing (walking) with cane in right hand using outer right ankle symptoms as a guide - Keep right foot/ankle elevated above the level of your heart as often as possible and perform gentle toe motion to encourage circulation and minimize swelling - Continue current medications - Start Rx vitamin D weekly - Return for follow-up in 4 weeks, obtain x-rays prior to visit for review - Contact us for any questions between now and then

## 2021-11-07 NOTE — Progress Notes (Signed)
Primary Care / Sports Medicine Office Visit  Patient Information:  Patient ID: Regina Macdonald, female DOB: 07-14-1941 Age: 80 y.o. MRN: [id number]   Regina Macdonald is a Regina Macdonald 80 y.o. female presenting with the following:  Chief Complaint  Patient presents with   Ankle Injury    Right ankle fracture; on 11/05/21 patient reports that she was stepping out of car, twisted her ankle, and fell; wearing tall Cam boot and using wheelchair for mobility today in office; 7/10 pain    Patient Active Problem List   Diagnosis Date Noted   Closed nondisplaced fracture of lateral malleolus of right fibula 11/07/2021   Acute upper respiratory infection 01/10/2020   Morbid obesity ([place]) 07/31/2019   Mild episode of recurrent major depressive disorder ([place]) 03/23/2018   Anxiety 03/23/2018   Taking medication for chronic disease 03/23/2018   Other constipation 12/13/2017   Depression with anxiety 08/19/2017   Hyperlipidemia 08/19/2017   Gastroesophageal reflux disease without esophagitis 08/19/2017   Other chest pain 04/16/2015   COPD exacerbation ([place]) 04/16/2015   Sleep apnea 04/16/2015   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  DG Ankle Complete Right  Result Date: 11/05/2021 CLINICAL DATA:  Recent fall with right ankle pain, initial encounter EXAM: RIGHT ANKLE - COMPLETE 3+ VIEW COMPARISON:  None FINDINGS: Undisplaced distal fibular fracture is noted with associated soft tissue swelling. No tibial fracture is seen. Calcaneal spurring is noted. IMPRESSION: Distal fibular fracture  without significant displacement. Associated soft tissue swelling is noted. Electronically Signed   By: Inez Catalina M.D.   On: 11/05/2021 20:28     Independent interpretation of notes and tests performed by another provider:   Independent interpretation of right ankle x-rays dated 11/05/2021 from [facility] radiology reveal oblique, nondisplaced fracture at the lateral malleolus, mortise appears intact, there is adjacent soft tissue shadow swelling, lateral view with Achilles insertional enthesophyte, no additional acute osseous processes noted  Procedures performed:   None  Pertinent History, Exam, Impression, and Recommendations:   Closed nondisplaced fracture of lateral malleolus of right fibula Regina Macdonald 36-year-old female presents after traumatic ankle inversion injury resulting in lateral ankle pain, was seen and evaluated by [place] urgent [facility] and found to have a nondisplaced lateral malleolus fracture, was advised further evaluation and management.  Examination today reveals mild swelling to the lateral right ankle, mild ecchymosis, no gross deformity noted.  She is able to independently fire all motor units and notes pain during active inversion, nontender at the fibular head and fifth metatarsal base, medially at the deltoid ligament or other bony prominences, she is focally tender at the lateral malleolus and at the PT FL, laxity examination limited due to acuity of symptoms.  Given her clinical history, radiographic features, and overall reassuring physical examination findings today, I have advised cam boot versus cast, she states that she is amenable to cam boot, I have expressed the importance of using the boot constantly as it for a cast, elevating the foot/ankle, perform gentle toe motion to minimize risk for DVT, and  we will initiate Rx vitamin D weekly x8 weeks.  Activity modifications reviewed and she is to not push through pain while weightbearing with cane assistance.  She is to  return for follow-up in 4 weeks, plan for repeat x-rays out of the boot prior to our visit.  The patient and her son were advised to contact us for any questions between now and then    Orders & Medications Meds ordered this encounter  Medications   Vitamin D, Ergocalciferol, (DRISDOL) 1.25 MG (50000 UNIT) CAPS capsule    Sig: Take 1 capsule (50,000 Units total) by mouth every 7 (seven) days. Take for 8 total doses(weeks)    Dispense:  8 capsule    Refill:  0   Orders Placed This Encounter  Procedures   DG Ankle Complete Right     Return in about 4 weeks (around 12/05/2021) for Follow-up right ankle fracture, obtain x-rays prior to visit.     Montel Culver, MD   Primary Care Sports Medicine [place]

## 2021-11-18 ENCOUNTER — Telehealth: Payer: Self-pay | Admitting: Family Medicine

## 2021-11-18 NOTE — Telephone Encounter (Signed)
Copied from [place] [id number]. Topic: Appointment Scheduling - Scheduling Inquiry for Clinic >> Nov 18, 2021 10:22 AM Greggory Keen D wrote: Reason for CRM: Pt called she has a question about her appt on 12/05/21 . she would like Baxter Flattery to call her back  CB#  [phone]

## 2021-12-05 ENCOUNTER — Encounter: Payer: Self-pay | Admitting: Family Medicine

## 2021-12-05 ENCOUNTER — Ambulatory Visit
Admission: RE | Admit: 2021-12-05 | Discharge: 2021-12-05 | Disposition: A | Discharge status: Home / Self Care | Payer: Medicare Other | Source: Ambulatory Visit | Attending: Family Medicine | Admitting: Family Medicine

## 2021-12-05 ENCOUNTER — Ambulatory Visit (INDEPENDENT_AMBULATORY_CARE_PROVIDER_SITE_OTHER): Payer: Medicare Other | Admitting: Family Medicine

## 2021-12-05 ENCOUNTER — Ambulatory Visit
Admission: RE | Admit: 2021-12-05 | Discharge: 2021-12-05 | Disposition: A | Discharge status: Home / Self Care | Payer: Medicare Other | Attending: Family Medicine | Admitting: Family Medicine

## 2021-12-05 ENCOUNTER — Other Ambulatory Visit: Payer: Self-pay

## 2021-12-05 VITALS — BP 102/62 | HR 81 | Ht 63.0 in | Wt 176.0 lb

## 2021-12-05 DIAGNOSIS — S8264XA Nondisplaced fracture of lateral malleolus of right fibula, initial encounter for closed fracture: Secondary | ICD-10-CM

## 2021-12-05 DIAGNOSIS — S82831A Other fracture of upper and lower end of right fibula, initial encounter for closed fracture: Secondary | ICD-10-CM | POA: Diagnosis not present

## 2021-12-05 DIAGNOSIS — S8264XD Nondisplaced fracture of lateral malleolus of right fibula, subsequent encounter for closed fracture with routine healing: Secondary | ICD-10-CM

## 2021-12-05 NOTE — Progress Notes (Signed)
°  ° °  Primary Care / Sports Medicine Office Visit  Patient Information:  Patient ID: Regina Macdonald, female DOB: [birth year] Age: 81 y.o. MRN: [id number]   Regina Macdonald is a pleasant 81 y.o. female presenting with the following:  Chief Complaint  Patient presents with   Follow-up   Closed nondisplaced fracture of lateral malleolus of right     Vitals:   12/05/21 1316  BP: 102/62  Pulse: 81  SpO2: 98%   Vitals:   12/05/21 1316  Weight: 176 lb (79.8 kg)  Height: 5\' 3"  (1.6 m)   Body mass index is 31.18 kg/m.  DG Ankle Complete Right  Result Date: 11/05/2021 CLINICAL DATA:  Recent fall with right ankle pain, initial encounter EXAM: RIGHT ANKLE - COMPLETE 3+ VIEW COMPARISON:  None FINDINGS: Undisplaced distal fibular fracture is noted with associated soft tissue swelling. No tibial fracture is seen. Calcaneal spurring is noted. IMPRESSION: Distal fibular fracture without significant displacement. Associated soft tissue swelling is noted. Electronically Signed   By: Regina Macdonald M.D.   On: 11/05/2021 20:28     Independent interpretation of notes and tests performed by another provider:   Independent x-ray interpretation reveals interval stability and alignment, expected osseous resorption at the lateral malleolus fracture line. Interval reduction in lateral soft tissue swelling noted.  Procedures performed:   None  Pertinent History, Exam, Impression, and Recommendations:   Closed nondisplaced fracture of lateral malleolus of right fibula Patient presents with her son 4 weeks from date of injury with closed nondisplaced lateral malleolus fracture of the right ankle.  She has been compliant with a cam boot, endorses no pain at rest or with ambulation, has been using a cane for ambulatory assistance without issue.  Additionally, she has been dosing Rx vitamin D as prescribed.  Her examination today reveals resolution of previously noted swelling, she is nontender to deep  palpation about the lateral malleolus, percussion elicits minor discomfort, nontender at the PT FL, range of motion is consistent with immobilization, calf is supple and nontender.  Sensorimotor otherwise intact.  Her x-rays reveal interval stability and alignment, expected osseous resorption fracture line.  Given her excellent clinical picture and reassuring x-rays, plan for gradual wean from cam boot over the next few weeks, start of home-based exercises, continued vitamin D, and return in 4 weeks for reevaluation.  She is to obtain new x-rays prior to that visit for review.  If continued clinical improvement and radiographic stability noted, anticipate gradual return to full activity and follow-up as needed.  If suboptimal progress noted, can consider formal physical therapy, additional x-ray, possible immobilization.   Orders & Medications No orders of the defined types were placed in this encounter.  Orders Placed This Encounter  Procedures   DG Ankle Complete Right     Return in about 4 weeks (around 01/02/2022).     Regina Culver, MD   Primary Care Sports Medicine [place]

## 2021-12-05 NOTE — Patient Instructions (Signed)
-   Gradually wean from cam boot over the next 2 weeks (start spending more and more time out of the boot while at home, use foot/ankle pain as a guide for when to take rest) - Start home exercises with information provided, perform these daily - Continue weekly Rx vitamin D and daily calcium - Return for follow-up in 4 weeks, obtain x-rays prior to visit for review - Contact your office for any questions or concerns between now and that

## 2021-12-05 NOTE — Assessment & Plan Note (Signed)
Patient presents with her son 4 weeks from date of injury with closed nondisplaced lateral malleolus fracture of the right ankle.  She has been compliant with a cam boot, endorses no pain at rest or with ambulation, has been using a cane for ambulatory assistance without issue.  Additionally, she has been dosing Rx vitamin D as prescribed.  Her examination today reveals resolution of previously noted swelling, she is nontender to deep palpation about the lateral malleolus, percussion elicits minor discomfort, nontender at the PT FL, range of motion is consistent with immobilization, calf is supple and nontender.  Sensorimotor otherwise intact.  Her x-rays reveal interval stability and alignment, expected osseous resorption fracture line.  Given her excellent clinical picture and reassuring x-rays, plan for gradual wean from cam boot over the next few weeks, start of home-based exercises, continued vitamin D, and return in 4 weeks for reevaluation.  She is to obtain new x-rays prior to that visit for review.  If continued clinical improvement and radiographic stability noted, anticipate gradual return to full activity and follow-up as needed.  If suboptimal progress noted, can consider formal physical therapy, additional x-ray, possible immobilization.

## 2021-12-10 DIAGNOSIS — G4733 Obstructive sleep apnea (adult) (pediatric): Secondary | ICD-10-CM | POA: Diagnosis not present

## 2021-12-15 ENCOUNTER — Other Ambulatory Visit: Payer: Self-pay | Admitting: Family Medicine

## 2021-12-15 DIAGNOSIS — J309 Allergic rhinitis, unspecified: Secondary | ICD-10-CM

## 2022-01-02 ENCOUNTER — Ambulatory Visit
Admission: RE | Admit: 2022-01-02 | Discharge: 2022-01-02 | Disposition: A | Discharge status: Home / Self Care | Payer: Medicare Other | Attending: Family Medicine | Admitting: Family Medicine

## 2022-01-02 ENCOUNTER — Encounter: Payer: Self-pay | Admitting: Family Medicine

## 2022-01-02 ENCOUNTER — Ambulatory Visit (INDEPENDENT_AMBULATORY_CARE_PROVIDER_SITE_OTHER): Payer: Medicare Other | Admitting: Family Medicine

## 2022-01-02 ENCOUNTER — Ambulatory Visit
Admission: RE | Admit: 2022-01-02 | Discharge: 2022-01-02 | Disposition: A | Discharge status: Home / Self Care | Payer: Medicare Other | Source: Ambulatory Visit | Attending: Family Medicine | Admitting: Family Medicine

## 2022-01-02 ENCOUNTER — Other Ambulatory Visit: Payer: Self-pay

## 2022-01-02 VITALS — BP 158/72 | HR 68 | Ht 63.0 in | Wt 175.0 lb

## 2022-01-02 DIAGNOSIS — S8264XA Nondisplaced fracture of lateral malleolus of right fibula, initial encounter for closed fracture: Secondary | ICD-10-CM | POA: Diagnosis not present

## 2022-01-02 DIAGNOSIS — S8264XD Nondisplaced fracture of lateral malleolus of right fibula, subsequent encounter for closed fracture with routine healing: Secondary | ICD-10-CM

## 2022-01-02 DIAGNOSIS — S82831A Other fracture of upper and lower end of right fibula, initial encounter for closed fracture: Secondary | ICD-10-CM | POA: Diagnosis not present

## 2022-01-02 NOTE — Patient Instructions (Signed)
-   Continue with home exercises with information provided - Gradually advance to normal activity without restrictions using ankle symptoms as a guide - Contact us for any questions and follow-up as needed

## 2022-01-02 NOTE — Progress Notes (Signed)
°  ° °  Primary Care / Sports Medicine Office Visit  Patient Information:  Patient ID: Regina Macdonald, female DOB: 1941/04/28 Age: 81 y.o. MRN: [id number]   Regina Macdonald is a pleasant 81 y.o. female presenting with the following:  Chief Complaint  Patient presents with   Closed nondisplaced fracture of lateral malleolus of right     Repeat X-Ray today before appointment; doing home exercises infrequently; completed course of vitamin D; ambulatory today; denies pain in office    Vitals:   01/02/22 1325  BP: (!) 158/72  Pulse: 68  SpO2: 97%   Vitals:   01/02/22 1325  Weight: 175 lb (79.4 kg)  Height: 5\' 3"  (1.6 m)   Body mass index is 31 kg/m.  DG Ankle Complete Right  Result Date: 12/05/2021 CLINICAL DATA:  Follow-up fracture EXAM: RIGHT ANKLE - COMPLETE 3+ VIEW COMPARISON:  11/05/2021 FINDINGS: Ankle mortise is symmetric. No change in alignment of the nondisplaced distal fibular malleolar fracture without significant bridging callus. IMPRESSION: No change in alignment of nondisplaced distal fibular fracture without much interval bridging callus. Fracture lucency remains prominent and there may be developing sclerosis at the fracture margins. Electronically Signed   By: Donavan Foil M.D.   On: 12/05/2021 21:41     Independent interpretation of notes and tests performed by another provider:   None  Procedures performed:   None  Pertinent History, Exam, Impression, and Recommendations:   Closed nondisplaced fracture of lateral malleolus of right fibula Patient presents for follow-up to right lateral malleolus fracture, date of injury 11/05/2021.  Over interval visit she has been able to successfully wean from the cam boot and gradual return to usual level of activity.  Pain has been minimal to nonexistent.  Not requiring any medications for pain.  Examination reveals full painless range of motion, nontender lateral malleolus to palpation, minimal discomfort with repeated  percussion alone.  X-rays do show interval callus and healing.  At this stage have advised the patient to advance with the home exercises, return to full activity without restrictions, and follow-up as needed.   Orders & Medications No orders of the defined types were placed in this encounter.  No orders of the defined types were placed in this encounter.    Return if symptoms worsen or fail to improve.     Montel Culver, MD   Primary Care Sports Medicine [place]

## 2022-01-02 NOTE — Assessment & Plan Note (Signed)
Patient presents for follow-up to right lateral malleolus fracture, date of injury 11/05/2021.  Over interval visit she has been able to successfully wean from the cam boot and gradual return to usual level of activity.  Pain has been minimal to nonexistent.  Not requiring any medications for pain.  Examination reveals full painless range of motion, nontender lateral malleolus to palpation, minimal discomfort with repeated percussion alone.  X-rays do show interval callus and healing.  At this stage have advised the patient to advance with the home exercises, return to full activity without restrictions, and follow-up as needed.

## 2022-01-26 ENCOUNTER — Ambulatory Visit: Payer: Self-pay | Admitting: *Deleted

## 2022-01-26 NOTE — Telephone Encounter (Signed)
?  Summary: pt has a cold/what to take?  ? Pt states she slept w/ her windows open the other night and believes she cault a cold.  Would like to know wht she can take?  ?  ? ? ? ?Chief Complaint: advise of OTC medications for sneezing and runny nose  ?Symptoms: feeling better, sneezing , runny nose no drainage ?Frequency: started over the weekend  ?Pertinent Negatives: Patient denies difficulty breathing, no fever, no sore throat, no cough  ?Disposition: '[]'$ ED /'[]'$ Urgent Care (no appt availability in office) / '[]'$ Appointment(In office/virtual)/ '[]'$  [facility]/ '[x]'$ Home Care/ '[]'$ Refused Recommended Disposition /'[]'$  Mobile Bus/ '[]'$  Follow-up with PCP ?Additional Notes:  ? ?Please advise for medications to take. Patient has been taking zycam.  ? ? ? ?Reason for Disposition ? Common cold with no complications ? ?Answer Assessment - Initial Assessment Questions ?1. ONSET: "When did the nasal discharge start?"  ?    Over the weekend  ?2. AMOUNT: "How much discharge is there?"  ?    Na  ?3. COUGH: "Do you have a cough?" If yes, ask: "Describe the color of your sputum" (clear, white, yellow, green) ?    no ?4. RESPIRATORY DISTRESS: "Describe your breathing."  ?    ok ?5. FEVER: "Do you have a fever?" If Yes, ask: "What is your temperature, how was it measured, and when did it start?" ?    no ?6. SEVERITY: "Overall, how bad are you feeling right now?" (e.g., doesn't interfere with normal activities, staying home from school/work, staying in bed)  ?    Feeling better just sneezing and runny nose ?7. OTHER SYMPTOMS: "Do you have any other symptoms?" (e.g., sore throat, earache, wheezing, vomiting) ?    Denies  ?8. PREGNANCY: "Is there any chance you are pregnant?" "When was your last menstrual period?" ?    na ? ?Protocols used: Common Cold-A-AH ? ?

## 2022-01-26 NOTE — Telephone Encounter (Signed)
Summary: pt has a cold/what to take?  ? Pt states she slept w/ her windows open the other night and believes she cault a cold.  Would like to know wht she can take?   ?  ? ? ?Called patient to review cold symptoms. No answer, LVMTCB [phone]. ?

## 2022-01-26 NOTE — Telephone Encounter (Signed)
Left message to call back about symptoms. °

## 2022-02-02 ENCOUNTER — Ambulatory Visit: Payer: Medicare Other | Admitting: Internal Medicine

## 2022-02-04 ENCOUNTER — Encounter: Payer: Self-pay | Admitting: Family Medicine

## 2022-02-04 ENCOUNTER — Other Ambulatory Visit: Payer: Self-pay

## 2022-02-04 ENCOUNTER — Ambulatory Visit (INDEPENDENT_AMBULATORY_CARE_PROVIDER_SITE_OTHER): Payer: Medicare Other | Admitting: Family Medicine

## 2022-02-04 VITALS — BP 120/80 | HR 60 | Ht 63.0 in | Wt 175.0 lb

## 2022-02-04 DIAGNOSIS — Z8719 Personal history of other diseases of the digestive system: Secondary | ICD-10-CM

## 2022-02-04 DIAGNOSIS — R3121 Asymptomatic microscopic hematuria: Secondary | ICD-10-CM | POA: Diagnosis not present

## 2022-02-04 DIAGNOSIS — N362 Urethral caruncle: Secondary | ICD-10-CM | POA: Diagnosis not present

## 2022-02-04 DIAGNOSIS — N939 Abnormal uterine and vaginal bleeding, unspecified: Secondary | ICD-10-CM | POA: Diagnosis not present

## 2022-02-04 LAB — POCT URINALYSIS DIPSTICK
Bilirubin, UA: NEGATIVE
Glucose, UA: NEGATIVE
Ketones, UA: NEGATIVE
Leukocytes, UA: NEGATIVE
Nitrite, UA: NEGATIVE
Protein, UA: NEGATIVE
Spec Grav, UA: 1.01 (ref 1.010–1.025)
Urobilinogen, UA: 0.2 E.U./dL
pH, UA: 6 (ref 5.0–8.0)

## 2022-02-04 NOTE — Progress Notes (Signed)
Urethral pol ? ? ?Date:  02/04/2022  ? ?Name:  Regina Macdonald   DOB:  05-May-1941   MRN:  [id number] ? ? ?Chief Complaint: Vaginal Bleeding (Noticed quarter size blood in panties) ? ?Vaginal Bleeding ?The patient's primary symptoms include vaginal bleeding. The patient's pertinent negatives include no genital itching, genital lesions, genital odor, genital rash, missed menses, pelvic pain or vaginal discharge. This is a new problem. The current episode started in the past 7 days (saturday). The problem occurs intermittently. Progression since onset: one episode. The patient is experiencing no pain. Associated symptoms include constipation. Pertinent negatives include no abdominal pain, anorexia, back pain, chills, diarrhea, discolored urine, dysuria, fever, flank pain, frequency, headaches, hematuria, joint pain, joint swelling, nausea, rash, sore throat or urgency. The vaginal bleeding is spotting. She has not been passing clots. She has not been passing tissue. Nothing aggravates the symptoms. She has tried nothing for the symptoms. She is not sexually active. She is postmenopausal. Her past medical history is significant for a gynecological surgery.  ? ?Lab Results  ?Component Value Date  ? NA 141 09/05/2021  ? K 4.3 09/05/2021  ? CO2 21 09/05/2021  ? GLUCOSE 78 09/05/2021  ? BUN 14 09/05/2021  ? CREATININE 0.70 09/05/2021  ? CALCIUM 9.2 09/05/2021  ? EGFR 86 09/05/2021  ? GFRNONAA 60 01/24/2021  ? ?Lab Results  ?Component Value Date  ? CHOL 138 09/05/2021  ? HDL 38 (L) 09/05/2021  ? [place] 87 09/05/2021  ? TRIG 65 09/05/2021  ? CHOLHDL 3.0 08/19/2017  ? ?Lab Results  ?Component Value Date  ? TSH 3.16 01/24/2021  ? ?Lab Results  ?Component Value Date  ? HGBA1C 5.7 (H) 12/02/2015  ? ?Lab Results  ?Component Value Date  ? WBC 7.2 12/20/2019  ? HGB 11.7 12/20/2019  ? HCT 34.3 12/20/2019  ? MCV 90 12/20/2019  ? PLT 316 12/20/2019  ? ?Lab Results  ?Component Value Date  ? ALT 23 01/24/2021  ? AST 14 01/24/2021  ?  ALKPHOS 112 07/31/2019  ? BILITOT 0.3 07/31/2019  ? ?No results found for: 25OHVITD2, [place], VD25OH  ? ?Review of Systems  ?Constitutional:  Negative for chills and fever.  ?HENT:  Negative for sore throat.   ?Gastrointestinal:  Positive for constipation. Negative for abdominal pain, anal bleeding, anorexia, diarrhea, nausea and rectal pain.  ?Genitourinary:  Positive for vaginal bleeding. Negative for dysuria, flank pain, frequency, hematuria, missed menses, pelvic pain, urgency and vaginal discharge.  ?Musculoskeletal:  Negative for back pain and joint pain.  ?Skin:  Negative for rash.  ?Neurological:  Negative for headaches.  ? ?Patient Active Problem List  ? Diagnosis Date Noted  ? Closed nondisplaced fracture of lateral malleolus of right fibula 11/07/2021  ? Acute upper respiratory infection 01/10/2020  ? Morbid obesity ([place]) 07/31/2019  ? 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 Anxiety 03/23/2018  ? Taking medication for chronic disease 03/23/2018  ? Other constipation 12/13/2017  ? Depression with anxiety 08/19/2017  ? Hyperlipidemia 08/19/2017  ? Gastroesophageal reflux disease without esophagitis 08/19/2017  ? Other chest pain 04/16/2015  ? COPD exacerbation ([place]) 04/16/2015  ? Sleep apnea 04/16/2015  ? 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  ? Asthmatic bronchitis 04/16/2015  ? Abdominal pain, LLQ 03/29/2014  ? Rectal bleeding 03/29/2014  ? Arthritis, 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  ? Osteoarthrosis, unspecified whether generalized or localized, pelvic region and thigh 03/26/2014  ? ? ?Allergies  ?Allergen Reactions  ? Ciprofloxacin Other (See Comments)  ? Etodolac  Other (See Comments)  ? Sulfa Antibiotics Rash  ? ? ?Past Surgical History:  ?Procedure Laterality Date  ? ABDOMINAL HYSTERECTOMY    ? COLONOSCOPY  06/08/2013  ? Dr Candace Cruise- small mouth diverticula  ? HIP SURGERY Left   ? KNEE SURGERY Bilateral   ? NASAL RECONSTRUCTION    ? x 2  ? ? ?Social History  ? ?Tobacco Use  ? Smoking  status: Former  ? Smokeless tobacco: Never  ?Vaping Use  ? Vaping Use: Never used  ?Substance Use Topics  ? Alcohol use: Not Currently  ? Drug use: Never  ? ? ? ?Medication list has been reviewed and updated. ? ?Current Meds  ?Medication Sig  ? acetaminophen (TYLENOL) 500 MG tablet Take 500 mg by mouth every 6 (six) hours as needed.  ? aspirin 81 MG chewable tablet Chew 81 mg by mouth daily.  ? CALCIUM & MAGNESIUM CARBONATES PO Take 1 tablet by mouth daily.  ? EQ LORATADINE 10 MG tablet Take 1 tablet by mouth once daily  ? EQUATE STOOL SOFTENER 100 MG capsule Take 1 capsule by mouth twice daily  ? FLUoxetine (PROZAC) 10 MG capsule Take 1 capsule (10 mg total) by mouth daily.  ? FLUoxetine (PROZAC) 20 MG capsule Take 1 capsule (20 mg total) by mouth daily.  ? fluticasone (FLONASE) 50 MCG/ACT nasal spray Use 2 spray(s) in each nostril once daily  ? fluticasone furoate-vilanterol (BREO ELLIPTA) 100-25 MCG/INH AEPB Inhale 1 puff into the lungs daily.  ? gemfibrozil (LOPID) 600 MG tablet Take 1 tablet (600 mg total) by mouth 2 (two) times daily.  ? ibuprofen (ADVIL) 200 MG tablet Take 400 mg by mouth every 6 (six) hours as needed for moderate pain.  ? Multiple Vitamins-Minerals (CENTRUM SILVER PO) Take 1 capsule by mouth daily.  ? pantoprazole (PROTONIX) 40 MG tablet Take 1 tablet (40 mg total) by mouth daily.  ? polyethylene glycol (MIRALAX) packet Take 17 g by mouth daily.  ? ? ? ?  01/02/2022  ?  1:35 PM 12/05/2021  ?  1:54 PM 11/07/2021  ?  3:27 PM 10/22/2021  ? 11:15 AM  ?PHQ 2/9 Scores  ?PHQ - 2 Score 0 1 0 1  ?PHQ- 9 Score 0 1 0 2  ? ? ? ?  01/02/2022  ?  1:35 PM 12/05/2021  ?  1:54 PM 11/07/2021  ?  3:27 PM 09/05/2021  ? 10:20 AM  ?GAD 7 : Generalized Anxiety Score  ?Nervous, Anxious, on Edge 0 0 0 1  ?Control/stop worrying 0 0 0 0  ?Worry too much - different things 0 0 0 0  ?Trouble relaxing 0 0 0 0  ?Restless 0 0 0 0  ?Easily annoyed or irritable 0 0 0 0  ?Afraid - awful might happen 0 0 0 0  ?Total GAD 7 Score 0 0  0 1  ?Anxiety Difficulty Not difficult at all Not difficult at all Not difficult at all Not difficult at all  ? ? ?BP Readings from Last 3 Encounters:  ?02/04/22 120/80  ?01/02/22 (!) 158/72  ?12/05/21 102/62  ? ? ?Physical Exam ?Vitals and nursing note reviewed. Exam conducted with a chaperone present.  ?Constitutional:   ?   Appearance: She is well-developed.  ?HENT:  ?   Head: Normocephalic.  ?   Right Ear: External ear normal.  ?   Left Ear: External ear normal.  ?Eyes:  ?   General: Lids are everted, no foreign bodies appreciated. No scleral icterus.    ?  Left eye: No foreign body or hordeolum.  ?   Conjunctiva/sclera: Conjunctivae normal.  ?   Right eye: Right conjunctiva is not injected.  ?   Left eye: Left conjunctiva is not injected.  ?   Pupils: Pupils are equal, round, and reactive to light.  ?Neck:  ?   Thyroid: No thyromegaly.  ?   Vascular: No JVD.  ?   Trachea: No tracheal deviation.  ?Cardiovascular:  ?   Rate and Rhythm: Normal rate and regular rhythm.  ?   Heart sounds: Normal heart sounds. No murmur heard. ?  No friction rub. No gallop.  ?Pulmonary:  ?   Effort: Pulmonary effort is normal. No respiratory distress.  ?   Breath sounds: Normal breath sounds. No wheezing or rales.  ?Abdominal:  ?   General: Bowel sounds are normal.  ?   Palpations: Abdomen is soft. There is no mass.  ?   Tenderness: There is no abdominal tenderness. There is no guarding or rebound.  ?Genitourinary: ?   Labia:     ?   Right: No rash, tenderness, lesion or injury.     ?   Left: No rash, tenderness, lesion or injury.   ?   Urethra: Prolapse present. No urethral pain, urethral swelling or urethral lesion.  ?   Comments: Prolapse vs polyp of urethra ?Musculoskeletal:     ?   General: No tenderness. Normal range of motion.  ?   Cervical back: Normal range of motion and neck supple.  ?Lymphadenopathy:  ?   Cervical: No cervical adenopathy.  ?Skin: ?   General: Skin is warm.  ?   Findings: No rash.  ?Neurological:  ?    Mental Status: She is alert and oriented to person, place, and time.  ?   Cranial Nerves: No cranial nerve deficit.  ?   Deep Tendon Reflexes: Reflexes normal.  ?Psychiatric:     ?   Mood and Affect: Mood is not

## 2022-02-06 DIAGNOSIS — G4733 Obstructive sleep apnea (adult) (pediatric): Secondary | ICD-10-CM | POA: Diagnosis not present

## 2022-02-10 ENCOUNTER — Ambulatory Visit: Payer: Medicare Other | Admitting: Urology

## 2022-03-11 DIAGNOSIS — G4733 Obstructive sleep apnea (adult) (pediatric): Secondary | ICD-10-CM | POA: Diagnosis not present

## 2022-03-16 ENCOUNTER — Encounter: Payer: Self-pay | Admitting: Urology

## 2022-03-16 ENCOUNTER — Ambulatory Visit: Payer: Medicare Other | Admitting: Urology

## 2022-03-16 VITALS — BP 117/62 | HR 87 | Ht 63.0 in | Wt 175.0 lb

## 2022-03-16 DIAGNOSIS — N362 Urethral caruncle: Secondary | ICD-10-CM

## 2022-03-16 DIAGNOSIS — R3129 Other microscopic hematuria: Secondary | ICD-10-CM | POA: Diagnosis not present

## 2022-03-16 LAB — URINALYSIS, COMPLETE
Bilirubin, UA: NEGATIVE
Glucose, UA: NEGATIVE
Ketones, UA: NEGATIVE
Nitrite, UA: NEGATIVE
Protein,UA: NEGATIVE
Specific Gravity, UA: 1.015 (ref 1.005–1.030)
Urobilinogen, Ur: 0.2 mg/dL (ref 0.2–1.0)
pH, UA: 7 (ref 5.0–7.5)

## 2022-03-16 LAB — MICROSCOPIC EXAMINATION: RBC, Urine: >30 /hpf — AB (ref 0–2)

## 2022-03-16 NOTE — Patient Instructions (Signed)

## 2022-03-16 NOTE — Progress Notes (Signed)
? ?03/16/2022 ?3:32 PM  ? ?Regina Macdonald ?06-Nov-1941 ?[id number] ? ?Referring provider: Juline Patch, MD ?[address] ?[address] ?[place],  [place] [address] ? ?No chief complaint on file. ? ? ?HPI: ?Was consulted to assess the patient for possible urethral mucosal prolapse.  She had a spot of blood on her underclothes about a month ago.  She has had a hysterectomy.  She may be having very thin yellowish-green discharge that is rare.  She wears a mini pad for mild stress incontinence sneezing. ? ?She voids every 2 or 3 hours and gets up once or twice a night ? ?Takes daily aspirin but no blood thinner.  No significant smoking history ? ?Urinalysis dipstick February 04, 2022 showed no blood.  No recent renal x-ray.  Normal rectal examination by referring physician.  Negative blood in stool. ? ? ?PMH: ?Past Medical History:  ?Diagnosis Date  ? Anxiety   ? Asthma   ? 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 COPD (chronic obstructive pulmonary disease) ([place])   ? Depression   ? Diverticula, colon   ? Diverticulosis   ? GERD (gastroesophageal reflux disease)   ? Sleep apnea   ? ? ?Surgical History: ?Past Surgical History:  ?Procedure Laterality Date  ? ABDOMINAL HYSTERECTOMY    ? COLONOSCOPY  06/08/2013  ? Dr Candace Cruise- small mouth diverticula  ? HIP SURGERY Left   ? KNEE SURGERY Bilateral   ? NASAL RECONSTRUCTION    ? x 2  ? ? ?Home Medications:  ?Allergies as of 03/16/2022   ? ?   Reactions  ? Ciprofloxacin Other (See Comments)  ? Etodolac Other (See Comments)  ? Sulfa Antibiotics Rash  ? ?  ? ?  ?Medication List  ?  ? ?  ? Accurate as of Mar 16, 2022  3:32 PM. If you have any questions, ask your nurse or doctor.  ?  ?  ? ?  ? ?acetaminophen 500 MG tablet ?Commonly known as: TYLENOL ?Take 500 mg by mouth every 6 (six) hours as needed. ?  ?aspirin 81 MG chewable tablet ?Chew 81 mg by mouth daily. ?  ?CALCIUM & MAGNESIUM CARBONATES PO ?Take 1 tablet by mouth daily. ?  ?CENTRUM SILVER PO ?Take 1 capsule by mouth daily. ?  ?EQ Loratadine 10 MG  tablet ?Generic drug: loratadine ?Take 1 tablet by mouth once daily ?  ?EQUATE STOOL SOFTENER 100 MG capsule ?Generic drug: docusate sodium ?Take 1 capsule by mouth twice daily ?  ?FLUoxetine 10 MG capsule ?Commonly known as: PROZAC ?Take 1 capsule (10 mg total) by mouth daily. ?  ?FLUoxetine 20 MG capsule ?Commonly known as: PROZAC ?Take 1 capsule (20 mg total) by mouth daily. ?  ?fluticasone 50 MCG/ACT nasal spray ?Commonly known as: FLONASE ?Use 2 spray(s) in each nostril once daily ?  ?fluticasone furoate-vilanterol 100-25 MCG/INH Aepb ?Commonly known as: Breo Ellipta ?Inhale 1 puff into the lungs daily. ?  ?gemfibrozil 600 MG tablet ?Commonly known as: LOPID ?Take 1 tablet (600 mg total) by mouth 2 (two) times daily. ?  ?ibuprofen 200 MG tablet ?Commonly known as: ADVIL ?Take 400 mg by mouth every 6 (six) hours as needed for moderate pain. ?  ?pantoprazole 40 MG tablet ?Commonly known as: PROTONIX ?Take 1 tablet (40 mg total) by mouth daily. ?  ?polyethylene glycol 17 g packet ?Commonly known as: MiraLax ?Take 17 g by mouth daily. ?  ? ?  ? ? ?Allergies:  ?Allergies  ?Allergen Reactions  ? Ciprofloxacin Other (See  Comments)  ? Etodolac Other (See Comments)  ? Sulfa Antibiotics Rash  ? ? ?Family History: ?Family History  ?Problem Relation Age of Onset  ? Cirrhosis Father   ? Coronary artery disease Mother   ? Breast cancer Neg Hx   ? ? ?Social History:  reports that she has quit smoking. She has never used smokeless tobacco. She reports that she does not currently use alcohol. She reports that she does not use drugs. ? ?ROS: ?  ? ?  ? ?  ? ?  ? ?  ? ?  ? ?  ? ?  ? ?  ? ?  ? ?  ? ?  ? ?  ? ?Physical Exam: ?There were no vitals taken for this visit.  ?Constitutional:  Alert and oriented, No acute distress. ?HEENT: [place] AT, moist mucus membranes.  Trachea midline, no masses. ?Cardiovascular: No clubbing, cyanosis, or edema. ?Respiratory: Normal respiratory effort, no increased work of breathing. ?GI: Abdomen is soft,  nontender, nondistended, no abdominal masses ?GU: Very narrow introitus.  Mild urethral mucosal prolapse.  It was minimally congested ?Skin: No rashes, bruises or suspicious lesions. ?Lymph: No cervical or inguinal adenopathy. ?Neurologic: Grossly intact, no focal deficits, moving all 4 extremities. ?Psychiatric: Normal mood and affect. ? ?Laboratory Data: ?Lab Results  ?Component Value Date  ? WBC 7.2 12/20/2019  ? HGB 11.7 12/20/2019  ? HCT 34.3 12/20/2019  ? MCV 90 12/20/2019  ? PLT 316 12/20/2019  ? ? ?Lab Results  ?Component Value Date  ? CREATININE 0.70 09/05/2021  ? ? ?No results found for: PSA ? ?No results found for: TESTOSTERONE ? ?Lab Results  ?Component Value Date  ? HGBA1C 5.7 (H) 12/02/2015  ? ? ?Urinalysis ?   ?Component Value Date/Time  ? COLORURINE YELLOW (A) 07/20/2017 1345  ? APPEARANCEUR CLEAR (A) 07/20/2017 1345  ? LABSPEC 1.014 07/20/2017 1345  ? PHURINE 6.0 07/20/2017 1345  ? GLUCOSEU NEGATIVE 07/20/2017 1345  ? HGBUR NEGATIVE 07/20/2017 1345  ? BILIRUBINUR negative 02/04/2022 1141  ? [place] NEGATIVE 07/20/2017 1345  ? PROTEINUR Negative 02/04/2022 1141  ? PROTEINUR NEGATIVE 07/20/2017 1345  ? UROBILINOGEN 0.2 02/04/2022 1141  ? NITRITE negative 02/04/2022 1141  ? NITRITE NEGATIVE 07/20/2017 1345  ? LEUKOCYTESUR Negative 02/04/2022 1141  ? ? ?Pertinent Imaging: ?Urine reviewed.  Urine sent for culture.  Chart reviewed ? ?Assessment & Plan: Patient had vaginal spotting.  It occurred once.  She never saw blood in the bowl. ?Interestingly she did wipe again today after she voided and there was a little bit of blood.  She had microscopic hematuria.  Call if culture positive.  Get CT scan and come back for repeat pelvic examination with small speculum and cystoscopy.  ? ?There are no diagnoses linked to this encounter. ? ?No follow-ups on file. ? ?Reece Packer, MD ? ?[facility] ?[address], [address] ?[place],  [address] ?[phone] ?  ?

## 2022-03-19 ENCOUNTER — Ambulatory Visit: Payer: Medicare Other | Admitting: Internal Medicine

## 2022-03-19 LAB — CULTURE, URINE COMPREHENSIVE

## 2022-03-24 ENCOUNTER — Other Ambulatory Visit: Payer: Self-pay | Admitting: Family Medicine

## 2022-03-24 DIAGNOSIS — E785 Hyperlipidemia, unspecified: Secondary | ICD-10-CM

## 2022-03-24 DIAGNOSIS — F320 Major depressive disorder, single episode, mild: Secondary | ICD-10-CM

## 2022-03-24 DIAGNOSIS — F419 Anxiety disorder, unspecified: Secondary | ICD-10-CM

## 2022-03-31 ENCOUNTER — Ambulatory Visit (INDEPENDENT_AMBULATORY_CARE_PROVIDER_SITE_OTHER): Payer: Medicare Other | Admitting: Family Medicine

## 2022-03-31 VITALS — BP 130/76 | HR 60 | Ht 63.0 in | Wt 175.0 lb

## 2022-03-31 DIAGNOSIS — R030 Elevated blood-pressure reading, without diagnosis of hypertension: Secondary | ICD-10-CM | POA: Diagnosis not present

## 2022-03-31 DIAGNOSIS — J309 Allergic rhinitis, unspecified: Secondary | ICD-10-CM

## 2022-03-31 DIAGNOSIS — F419 Anxiety disorder, unspecified: Secondary | ICD-10-CM

## 2022-03-31 DIAGNOSIS — K219 Gastro-esophageal reflux disease without esophagitis: Secondary | ICD-10-CM

## 2022-03-31 DIAGNOSIS — F320 Major depressive disorder, single episode, mild: Secondary | ICD-10-CM

## 2022-03-31 DIAGNOSIS — E785 Hyperlipidemia, unspecified: Secondary | ICD-10-CM

## 2022-03-31 MED ORDER — FLUTICASONE PROPIONATE 50 MCG/ACT NA SUSP: NASAL | 1 refills | Status: DC

## 2022-03-31 MED ORDER — GEMFIBROZIL 600 MG PO TABS: 600.0000 mg | ORAL_TABLET | Freq: Two times a day (BID) | ORAL | 5 refills | Status: DC

## 2022-03-31 MED ORDER — FLUOXETINE HCL 10 MG PO CAPS: 10.0000 mg | ORAL_CAPSULE | Freq: Every day | ORAL | 5 refills | Status: DC

## 2022-03-31 MED ORDER — PANTOPRAZOLE SODIUM 40 MG PO TBEC: 40.0000 mg | DELAYED_RELEASE_TABLET | Freq: Every day | ORAL | 5 refills | Status: DC

## 2022-03-31 MED ORDER — FLUOXETINE HCL 20 MG PO CAPS: 20.0000 mg | ORAL_CAPSULE | Freq: Every day | ORAL | 5 refills | Status: DC

## 2022-03-31 NOTE — Progress Notes (Signed)
? ? ?Date:  03/31/2022  ? ?Name:  Regina Macdonald   DOB:  08/15/1941   MRN:  [id number] ? ? ?Chief Complaint: Gastroesophageal Reflux, Hyperlipidemia, Allergic Rhinitis , and Depression ? ?Gastroesophageal Reflux ?She reports no abdominal pain, no belching, no chest pain, no choking, no coughing, no dysphagia, no early satiety, no globus sensation, no heartburn, no hoarse voice, no nausea, no sore throat, no stridor or no wheezing. This is a chronic problem. The current episode started more than 1 year ago. The problem has been gradually improving. The symptoms are aggravated by certain foods. Pertinent negatives include no fatigue. She has tried a PPI for the symptoms. The treatment provided moderate relief.  ?Hyperlipidemia ?This is a chronic problem. The current episode started more than 1 year ago. The problem is controlled. Recent lipid tests were reviewed and are normal. She has no history of chronic renal disease, diabetes, hypothyroidism, liver disease, obesity or nephrotic syndrome. Pertinent negatives include no chest pain or myalgias. Current antihyperlipidemic treatment includes fibric acid derivatives. The current treatment provides moderate improvement of lipids. There are no compliance problems.   ?Depression ?       This is a chronic problem.  The current episode started more than 1 year ago.   The problem occurs intermittently.  The problem has been gradually improving since onset.  Associated symptoms include no decreased concentration, no fatigue, no helplessness, no hopelessness, does not have insomnia, not irritable, no restlessness, no decreased interest, no appetite change, no body aches, no myalgias, no headaches, no indigestion, not sad and no suicidal ideas.  Past treatments include SSRIs - Selective serotonin reuptake inhibitors.   Pertinent negatives include no hypothyroidism. ?URI  ?This is a chronic (for allergic rhinitis) problem. The problem has been waxing and waning. Pertinent  negatives include no abdominal pain, chest pain, coughing, dysuria, ear pain, headaches, nausea, plugged ear sensation, rash, rhinorrhea, sneezing, sore throat or wheezing. Treatments tried: nasal steroid. The treatment provided moderate relief.  ? ?Lab Results  ?Component Value Date  ? NA 141 09/05/2021  ? K 4.3 09/05/2021  ? CO2 21 09/05/2021  ? GLUCOSE 78 09/05/2021  ? BUN 14 09/05/2021  ? CREATININE 0.70 09/05/2021  ? CALCIUM 9.2 09/05/2021  ? EGFR 86 09/05/2021  ? GFRNONAA 60 01/24/2021  ? ?Lab Results  ?Component Value Date  ? CHOL 138 09/05/2021  ? HDL 38 (L) 09/05/2021  ? [place] 87 09/05/2021  ? TRIG 65 09/05/2021  ? CHOLHDL 3.0 08/19/2017  ? ?Lab Results  ?Component Value Date  ? TSH 3.16 01/24/2021  ? ?Lab Results  ?Component Value Date  ? HGBA1C 5.7 (H) 12/02/2015  ? ?Lab Results  ?Component Value Date  ? WBC 7.2 12/20/2019  ? HGB 11.7 12/20/2019  ? HCT 34.3 12/20/2019  ? MCV 90 12/20/2019  ? PLT 316 12/20/2019  ? ?Lab Results  ?Component Value Date  ? ALT 23 01/24/2021  ? AST 14 01/24/2021  ? ALKPHOS 112 07/31/2019  ? BILITOT 0.3 07/31/2019  ? ?No results found for: 25OHVITD2, [place], VD25OH  ? ?Review of Systems  ?Constitutional:  Negative for appetite change and fatigue.  ?HENT:  Negative for ear pain, hoarse voice, rhinorrhea, sneezing and sore throat.   ?Respiratory:  Negative for cough, choking and wheezing.   ?Cardiovascular:  Negative for chest pain.  ?Gastrointestinal:  Negative for abdominal pain, dysphagia, heartburn and nausea.  ?Genitourinary:  Negative for dysuria.  ?Musculoskeletal:  Negative for myalgias.  ?Skin:  Negative for rash.  ?  Neurological:  Negative for headaches.  ?Psychiatric/Behavioral:  Positive for depression. Negative for decreased concentration and suicidal ideas. The patient does not have insomnia.   ? ?Patient Active Problem List  ? Diagnosis Date Noted  ? Closed nondisplaced fracture of lateral malleolus of right fibula 11/07/2021  ? Acute upper respiratory infection  01/10/2020  ? Morbid obesity ([place]) 07/31/2019  ? 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 Anxiety 03/23/2018  ? Taking medication for chronic disease 03/23/2018  ? Other constipation 12/13/2017  ? Depression with anxiety 08/19/2017  ? Hyperlipidemia 08/19/2017  ? Gastroesophageal reflux disease without esophagitis 08/19/2017  ? Other chest pain 04/16/2015  ? COPD exacerbation ([place]) 04/16/2015  ? Sleep apnea 04/16/2015  ? 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  ? Asthmatic bronchitis 04/16/2015  ? Abdominal pain, LLQ 03/29/2014  ? Rectal bleeding 03/29/2014  ? Arthritis, 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  ? Osteoarthrosis, unspecified whether generalized or localized, pelvic region and thigh 03/26/2014  ? ? ?Allergies  ?Allergen Reactions  ? Ciprofloxacin Other (See Comments)  ? Etodolac Other (See Comments)  ? Sulfa Antibiotics Rash  ? ? ?Past Surgical History:  ?Procedure Laterality Date  ? ABDOMINAL HYSTERECTOMY    ? COLONOSCOPY  06/08/2013  ? Dr Candace Cruise- small mouth diverticula  ? HIP SURGERY Left   ? KNEE SURGERY Bilateral   ? NASAL RECONSTRUCTION    ? x 2  ? ? ?Social History  ? ?Tobacco Use  ? Smoking status: Former  ? Smokeless tobacco: Never  ?Vaping Use  ? Vaping Use: Never used  ?Substance Use Topics  ? Alcohol use: Not Currently  ? Drug use: Never  ? ? ? ?Medication list has been reviewed and updated. ? ?Current Meds  ?Medication Sig  ? acetaminophen (TYLENOL) 500 MG tablet Take 500 mg by mouth every 6 (six) hours as needed.  ? aspirin 81 MG chewable tablet Chew 81 mg by mouth daily.  ? CALCIUM & MAGNESIUM CARBONATES PO Take 1 tablet by mouth daily.  ? EQ LORATADINE 10 MG tablet Take 1 tablet by mouth once daily  ? EQUATE STOOL SOFTENER 100 MG capsule Take 1 capsule by mouth twice daily  ? FLUoxetine (PROZAC) 10 MG capsule Take 1 capsule by mouth once daily  ? FLUoxetine (PROZAC) 20 MG capsule Take 1 capsule by mouth once daily  ? fluticasone (FLONASE) 50 MCG/ACT nasal spray Use 2 spray(s) in  each nostril once daily  ? fluticasone furoate-vilanterol (BREO ELLIPTA) 100-25 MCG/INH AEPB Inhale 1 puff into the lungs daily.  ? gemfibrozil (LOPID) 600 MG tablet Take 1 tablet by mouth twice daily  ? ibuprofen (ADVIL) 200 MG tablet Take 400 mg by mouth every 6 (six) hours as needed for moderate pain.  ? Multiple Vitamins-Minerals (CENTRUM SILVER PO) Take 1 capsule by mouth daily.  ? pantoprazole (PROTONIX) 40 MG tablet Take 1 tablet (40 mg total) by mouth daily.  ? polyethylene glycol (MIRALAX) packet Take 17 g by mouth daily.  ? ? ? ?  03/31/2022  ? 10:57 AM 01/02/2022  ?  1:35 PM 12/05/2021  ?  1:54 PM 11/07/2021  ?  3:27 PM  ?GAD 7 : Generalized Anxiety Score  ?Nervous, Anxious, on Edge 0 0 0 0  ?Control/stop worrying 0 0 0 0  ?Worry too much - different things 0 0 0 0  ?Trouble relaxing 0 0 0 0  ?Restless 0 0 0 0  ?Easily annoyed or irritable 0 0 0 0  ?Afraid - awful might happen  0 0 0 0  ?Total GAD 7 Score 0 0 0 0  ?Anxiety Difficulty Not difficult at all Not difficult at all Not difficult at all Not difficult at all  ? ? ? ?  03/31/2022  ? 10:57 AM  ?Depression screen PHQ 2/9  ?Decreased Interest 0  ?Down, Depressed, Hopeless 0  ?PHQ - 2 Score 0  ?Altered sleeping 0  ?Tired, decreased energy 0  ?Change in appetite 0  ?Feeling bad or failure about yourself  0  ?Trouble concentrating 0  ?Moving slowly or fidgety/restless 0  ?Suicidal thoughts 0  ?PHQ-9 Score 0  ?Difficult doing work/chores Not difficult at all  ? ? ?BP Readings from Last 3 Encounters:  ?03/31/22 130/76  ?03/16/22 117/62  ?02/04/22 120/80  ? ? ?Physical Exam ?Vitals and nursing note reviewed. Exam conducted with a chaperone present.  ?Constitutional:   ?   General: She is not irritable.She is not in acute distress. ?   Appearance: She is not diaphoretic.  ?HENT:  ?   Head: Normocephalic and atraumatic.  ?   Right Ear: External ear normal.  ?   Left Ear: External ear normal.  ?   Nose: Nose normal.  ?Eyes:  ?   General:     ?   Right eye: No  discharge.     ?   Left eye: No discharge.  ?   Conjunctiva/sclera: Conjunctivae normal.  ?   Pupils: Pupils are equal, round, and reactive to light.  ?Neck:  ?   Thyroid: No thyromegaly.  ?   Vascular: No JVD.  ?C

## 2022-03-31 NOTE — Patient Instructions (Signed)
Diverticulitis  Diverticulitis is infection or inflammation of small pouches (diverticula) in the colon that form due to a condition called diverticulosis. Diverticula can trap stool (feces) and bacteria, causing infection and inflammation. Diverticulitis may cause severe stomach pain and diarrhea. It may lead to tissue damage in the colon that causes bleeding or blockage. The diverticula may also burst (rupture) and cause infected stool to enter other areas of the abdomen. What are the causes? This condition is caused by stool becoming trapped in the diverticula, which allows bacteria to grow in the diverticula. This leads to inflammation and infection. What increases the risk? You are more likely to develop this condition if you have diverticulosis. The risk increases if you: Are overweight or obese. Do not get enough exercise. Drink alcohol. Use tobacco products. Eat a diet that has a lot of red meat such as beef, pork, or lamb. Eat a diet that does not include enough fiber. High-fiber foods include fruits, vegetables, beans, nuts, and whole grains. Are over 40 years of age. What are the signs or symptoms? Symptoms of this condition may include: Pain and tenderness in the abdomen. The pain is normally located on the left side of the abdomen, but it may occur in other areas. Fever and chills. Nausea. Vomiting. Cramping. Bloating. Changes in bowel routines. Blood in your stool. How is this diagnosed? This condition is diagnosed based on: Your medical history. A physical exam. Tests to make sure there is nothing else causing your condition. These tests may include: Blood tests. Urine tests. CT scan of the abdomen. How is this treated? Most cases of this condition are mild and can be treated at home. Treatment may include: Taking over-the-counter pain medicines. Following a clear liquid diet. Taking antibiotic medicines by mouth. Resting. More severe cases may need to be treated  at a hospital. Treatment may include: Not eating or drinking. Taking prescription pain medicine. Receiving antibiotic medicines through an IV. Receiving fluids and nutrition through an IV. Surgery. When your condition is under control, your health care provider may recommend that you have a colonoscopy. This is an exam to look at the entire large intestine. During the exam, a lubricated, bendable tube is inserted into the anus and then passed into the rectum, colon, and other parts of the large intestine. A colonoscopy can show how severe your diverticula are and whether something else may be causing your symptoms. Follow these instructions at home: Medicines Take over-the-counter and prescription medicines only as told by your health care provider. These include fiber supplements, probiotics, and stool softeners. If you were prescribed an antibiotic medicine, take it as told by your health care provider. Do not stop taking the antibiotic even if you start to feel better. Ask your health care provider if the medicine prescribed to you requires you to avoid driving or using machinery. Eating and drinking  Follow a full liquid diet or another diet as directed by your health care provider. After your symptoms improve, your health care provider may tell you to change your diet. He or she may recommend that you eat a diet that contains at least 25 grams (25 g) of fiber daily. Fiber makes it easier to pass stool. Healthy sources of fiber include: Berries. One cup contains 4-8 grams of fiber. Beans or lentils. One-half cup contains 5-8 grams of fiber. Green vegetables. One cup contains 4 grams of fiber. Avoid eating red meat. General instructions Do not use any products that contain nicotine or tobacco, such as   cigarettes, e-cigarettes, and chewing tobacco. If you need help quitting, ask your health care provider. Exercise for at least 30 minutes, 3 times each week. You should exercise hard enough to  raise your heart rate and break a sweat. Keep all follow-up visits as told by your health care provider. This is important. You may need to have a colonoscopy. Contact a health care provider if: Your pain does not improve. Your bowel movements do not return to normal. Get help right away if: Your pain gets worse. Your symptoms do not get better with treatment. Your symptoms suddenly get worse. You have a fever. You vomit more than one time. You have stools that are bloody, black, or tarry. Summary Diverticulitis is infection or inflammation of small pouches (diverticula) in the colon that form due to a condition called diverticulosis. Diverticula can trap stool (feces) and bacteria, causing infection and inflammation. You are at higher risk for this condition if you have diverticulosis and you eat a diet that does not include enough fiber. Most cases of this condition are mild and can be treated at home. More severe cases may need to be treated at a hospital. When your condition is under control, your health care provider may recommend that you have an exam called a colonoscopy. This exam can show how severe your diverticula are and whether something else may be causing your symptoms. Keep all follow-up visits as told by your health care provider. This is important. This information is not intended to replace advice given to you by your health care provider. Make sure you discuss any questions you have with your health care provider. Document Revised: 08/14/2019 Document Reviewed: 08/14/2019 Elsevier Patient Education  2023 Elsevier Inc.  

## 2022-04-01 LAB — COMPREHENSIVE METABOLIC PANEL
ALT: 9 IU/L (ref 0–32)
AST: 22 IU/L (ref 0–40)
Albumin/Globulin Ratio: 1.6 (ref 1.2–2.2)
Albumin: 4.6 g/dL (ref 3.6–4.6)
Alkaline Phosphatase: 127 IU/L — ABNORMAL HIGH (ref 44–121)
BUN/Creatinine Ratio: 20 (ref 12–28)
BUN: 15 mg/dL (ref 8–27)
Bilirubin Total: 0.3 mg/dL (ref 0.0–1.2)
CO2: 23 mmol/L (ref 20–29)
Calcium: 9.2 mg/dL (ref 8.7–10.3)
Chloride: 101 mmol/L (ref 96–106)
Creatinine, Ser: 0.75 mg/dL (ref 0.57–1.00)
Globulin, Total: 2.8 g/dL (ref 1.5–4.5)
Glucose: 69 mg/dL — ABNORMAL LOW (ref 70–99)
Potassium: 4.4 mmol/L (ref 3.5–5.2)
Sodium: 138 mmol/L (ref 134–144)
Total Protein: 7.4 g/dL (ref 6.0–8.5)
eGFR: 79 mL/min/{1.73_m2} (ref >59)

## 2022-04-02 ENCOUNTER — Ambulatory Visit: Payer: Medicare Other | Admitting: Internal Medicine

## 2022-04-02 ENCOUNTER — Encounter: Payer: Self-pay | Admitting: Internal Medicine

## 2022-04-02 VITALS — BP 105/55 | HR 85 | Temp 98.1°F | Resp 16 | Ht 63.0 in | Wt 176.0 lb

## 2022-04-02 DIAGNOSIS — J986 Disorders of diaphragm: Secondary | ICD-10-CM

## 2022-04-02 DIAGNOSIS — Z9989 Dependence on other enabling machines and devices: Secondary | ICD-10-CM

## 2022-04-02 DIAGNOSIS — J449 Chronic obstructive pulmonary disease, unspecified: Secondary | ICD-10-CM

## 2022-04-02 DIAGNOSIS — G4733 Obstructive sleep apnea (adult) (pediatric): Secondary | ICD-10-CM | POA: Diagnosis not present

## 2022-04-02 DIAGNOSIS — R0602 Shortness of breath: Secondary | ICD-10-CM

## 2022-04-02 MED ORDER — FLUTICASONE FUROATE-VILANTEROL 100-25 MCG/ACT IN AEPB: 1.0000 | INHALATION_SPRAY | Freq: Every day | RESPIRATORY_TRACT | 11 refills | Status: DC

## 2022-04-02 NOTE — Patient Instructions (Signed)

## 2022-04-02 NOTE — Progress Notes (Signed)
[facility] [place], [place] [address]  Pulmonary Sleep Medicine   Office Visit Note  Patient Name: Regina Macdonald DOB: 13-Jan-1941 MRN [id number]  Date of Service: 04/02/2022  Complaints/HPI: OSA on CPAP. She states doing well. She has not had any issues with sinus congestion. Mask no issues. COPD under control no exacerbations of her COPD. She has some SOB when she gets up and walks too fast. She has to pace her self  ROS  General: (-) fever, (-) chills, (-) night sweats, (-) weakness Skin: (-) rashes, (-) itching,. Eyes: (-) visual changes, (-) redness, (-) itching. Nose and Sinuses: (-) nasal stuffiness or itchiness, (-) postnasal drip, (-) nosebleeds, (-) sinus trouble. Mouth and Throat: (-) sore throat, (-) hoarseness. Neck: (-) swollen glands, (-) enlarged thyroid, (-) neck pain. Respiratory: + cough, (-) bloody sputum, + shortness of breath, - wheezing. Cardiovascular: - ankle swelling, (-) chest pain. Lymphatic: (-) lymph node enlargement. Neurologic: (-) numbness, (-) tingling. Psychiatric: (-) anxiety, (-) depression   Current Medication: Outpatient Encounter Medications as of 04/02/2022  Medication Sig   acetaminophen (TYLENOL) 500 MG tablet Take 500 mg by mouth every 6 (six) hours as needed.   aspirin 81 MG chewable tablet Chew 81 mg by mouth daily.   CALCIUM & MAGNESIUM CARBONATES PO Take 1 tablet by mouth daily.   EQ LORATADINE 10 MG tablet Take 1 tablet by mouth once daily   EQUATE STOOL SOFTENER 100 MG capsule Take 1 capsule by mouth twice daily   FLUoxetine (PROZAC) 10 MG capsule Take 1 capsule (10 mg total) by mouth daily.   FLUoxetine (PROZAC) 20 MG capsule Take 1 capsule (20 mg total) by mouth daily.   fluticasone (FLONASE) 50 MCG/ACT nasal spray Use 2 sprays in each nostril daily   fluticasone furoate-vilanterol (BREO ELLIPTA) 100-25 MCG/INH AEPB Inhale 1 puff into the lungs daily.   gemfibrozil (LOPID) 600 MG tablet Take 1  tablet (600 mg total) by mouth 2 (two) times daily.   ibuprofen (ADVIL) 200 MG tablet Take 400 mg by mouth every 6 (six) hours as needed for moderate pain.   Multiple Vitamins-Minerals (CENTRUM SILVER PO) Take 1 capsule by mouth daily.   pantoprazole (PROTONIX) 40 MG tablet Take 1 tablet (40 mg total) by mouth daily.   polyethylene glycol (MIRALAX) packet Take 17 g by mouth daily.   No facility-administered encounter medications on file as of 04/02/2022.    Surgical History: Past Surgical History:  Procedure Laterality Date   ABDOMINAL HYSTERECTOMY     COLONOSCOPY  06/08/2013   Dr Candace Cruise- small mouth diverticula   HIP SURGERY Left    KNEE SURGERY Bilateral    NASAL RECONSTRUCTION     x 2    Medical History: Past Medical History:  Diagnosis Date   Anxiety    Asthma    Cancer ([place]) cervical-1973   COPD (chronic obstructive pulmonary disease) (HCC)    Depression    Diverticula, colon    Diverticulosis    GERD (gastroesophageal reflux disease)    Sleep apnea     Family History: Family History  Problem Relation Age of Onset   Cirrhosis Father    Coronary artery disease Mother    Breast cancer Neg Hx     Social History: Social History   Socioeconomic History   Marital status: Divorced    Spouse name: Not on file   Number of children: 3   Years of education: 12   Highest education level:  High school graduate  Occupational History   Occupation: retired  Tobacco Use   Smoking status: Former   Smokeless tobacco: Never  Scientific laboratory technician Use: Never used  Substance and Sexual Activity   Alcohol use: Not Currently   Drug use: Never   Sexual activity: Not Currently    Partners: Male    Birth control/protection: Post-menopausal  Other Topics Concern   Not on file  Social History Narrative   Pt lives with her daughter   Social Determinants of Health   Financial Resource Strain: Low Risk    Difficulty of Paying Living Expenses: Not hard at all  Food Insecurity:  No Food Insecurity   Worried About Charity fundraiser in the Last Year: Never true   Arboriculturist in the Last Year: Never true  Transportation Needs: No Transportation Needs   Lack of Transportation (Medical): No   Lack of Transportation (Non-Medical): No  Physical Activity: Inactive   Days of Exercise per Week: 0 days   Minutes of Exercise per Session: 0 min  Stress: No Stress Concern Present   Feeling of Stress : Not at all  Social Connections: Socially Isolated   Frequency of Communication with Friends and Family: More than three times a week   Frequency of Social Gatherings with Friends and Family: More than three times a week   Attends Religious Services: Never   Marine scientist or Organizations: No   Attends Music therapist: Never   Marital Status: Divorced  Human resources officer Violence: Not At Risk   Fear of Current or Ex-Partner: No   Emotionally Abused: No   Physically Abused: No   Sexually Abused: No    Vital Signs: Blood pressure (!) 105/55, pulse 85, temperature 98.1 F (36.7 C), resp. rate 16, height '5\' 3"'  (1.6 m), weight 176 lb (79.8 kg), SpO2 98 %.  Examination: General Appearance: The patient is well-developed, well-nourished, and in no distress. Skin: Gross inspection of skin unremarkable. Head: normocephalic, no gross deformities. Eyes: no gross deformities noted. ENT: ears appear grossly normal no exudates. Neck: Supple. No thyromegaly. No LAD. Respiratory: no rhonchi noted. Cardiovascular: Normal S1 and S2 without murmur or rub. Extremities: No cyanosis. pulses are equal. Neurologic: Alert and oriented. No involuntary movements.  LABS: Recent Results (from the past 2160 hour(s))  POCT urinalysis dipstick     Status: None   Collection Time: 02/04/22 11:41 AM  Result Value Ref Range   Color, UA yellow    Clarity, UA cloudy    Glucose, UA Negative Negative   Bilirubin, UA negative    Ketones, UA negative    Spec Grav, UA 1.010  1.010 - 1.025   Blood, UA small    pH, UA 6.0 5.0 - 8.0   Protein, UA Negative Negative   Urobilinogen, UA 0.2 0.2 or 1.0 E.U./dL   Nitrite, UA negative    Leukocytes, UA Negative Negative   Appearance yellow    Odor none   Urinalysis, Complete     Status: Abnormal   Collection Time: 03/16/22  4:03 PM  Result Value Ref Range   Specific Gravity, UA 1.015 1.005 - 1.030   pH, UA 7.0 5.0 - 7.5   Color, UA Yellow Yellow   Appearance Ur Clear Clear   Leukocytes,UA 2+ (A) Negative   Protein,UA Negative Negative/Trace   Glucose, UA Negative Negative   Ketones, UA Negative Negative   RBC, UA 2+ (A) Negative   Bilirubin,  UA Negative Negative   Urobilinogen, Ur 0.2 0.2 - 1.0 mg/dL   Nitrite, UA Negative Negative   Microscopic Examination See below:   CULTURE, URINE COMPREHENSIVE     Status: None   Collection Time: 03/16/22  4:03 PM   Specimen: Urine   UR  Result Value Ref Range   Urine Culture, Comprehensive Final report    Organism ID, Bacteria Comment     Comment: Mixed urogenital flora 10,000-25,000 colony forming units per mL   Microscopic Examination     Status: Abnormal   Collection Time: 03/16/22  4:03 PM   Urine  Result Value Ref Range   WBC, UA 11-30 (A) 0 - 5 /hpf   RBC >30 (A) 0 - 2 /hpf   Epithelial Cells (non renal) 0-10 0 - 10 /hpf   Bacteria, UA Few None seen/Few  Comprehensive Metabolic Panel (CMET)     Status: Abnormal   Collection Time: 03/31/22 11:50 AM  Result Value Ref Range   Glucose 69 (L) 70 - 99 mg/dL   BUN 15 8 - 27 mg/dL   Creatinine, Ser 0.75 0.57 - 1.00 mg/dL   eGFR 79 >59 mL/min/1.73   BUN/Creatinine Ratio 20 12 - 28   Sodium 138 134 - 144 mmol/L   Potassium 4.4 3.5 - 5.2 mmol/L   Chloride 101 96 - 106 mmol/L   CO2 23 20 - 29 mmol/L   Calcium 9.2 8.7 - 10.3 mg/dL   Total Protein 7.4 6.0 - 8.5 g/dL   Albumin 4.6 3.6 - 4.6 g/dL   Globulin, Total 2.8 1.5 - 4.5 g/dL   Albumin/Globulin Ratio 1.6 1.2 - 2.2   Bilirubin Total 0.3 0.0 - 1.2 mg/dL    Alkaline Phosphatase 127 (H) 44 - 121 IU/L   AST 22 0 - 40 IU/L   ALT 9 0 - 32 IU/L    Radiology: DG Ankle Complete Right  Result Date: 01/04/2022 CLINICAL DATA:  Closed nondisplaced fracture of lateral malleolus of right fibula with routine healing, subsequent encounter. Followup lateral malleolus fracture. Date of injury 11/05/2021 EXAM: RIGHT ANKLE - COMPLETE 3+ VIEW COMPARISON:  Ankle radiograph 12/05/2021, 11/05/2021 FINDINGS: Unchanged alignment of transverse distal fibular fracture. Fracture margins are becoming sclerotic, and there is no significant bony bridging. No new fracture. The ankle mortise is preserved. There may be a small ankle joint effusion. IMPRESSION: Unchanged alignment of transverse distal fibular fracture. Fracture margins are becoming sclerotic, no significant bony bridging. Electronically Signed   By: Keith Rake M.D.   On: 01/04/2022 01:29    No results found.  No results found.    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   Anxiety 03/23/2018   Taking medication for chronic disease 03/23/2018   Other constipation 12/13/2017   Depression with anxiety 08/19/2017   Hyperlipidemia 08/19/2017   Gastroesophageal reflux disease without esophagitis 08/19/2017   Other chest pain 04/16/2015   COPD exacerbation ([place]) 04/16/2015   Sleep apnea 04/16/2015   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   Asthmatic bronchitis 04/16/2015   Abdominal pain, LLQ 03/29/2014   Rectal bleeding 03/29/2014   Arthritis, 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   Osteoarthrosis, unspecified whether generalized or localized, pelvic region and thigh 03/26/2014    1. SOB (shortness of breath)  - Spirometry  with Graph  2. Obstructive  chronic bronchitis without exacerbation ([place]) On breo refill was given today. Has been well controlled overall  3. OSA on CPAP CPAP Counseling: had a lengthy discussion with the patient regarding the importance of PAP therapy in management of the sleep apnea. Patient appears to understand the risk factor reduction and also understands the risks associated with untreated sleep apnea. Patient will try to make a good faith effort to remain compliant with therapy. Also instructed the patient on proper cleaning of the device including the water must be changed daily if possible and use of distilled water is preferred. Patient understands that the machine should be regularly cleaned with appropriate recommended cleaning solutions that do not damage the PAP machine for example given white vinegar and water rinses. Other methods such as ozone treatment may not be as good as these simple methods to achieve cleaning.   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  I have also discussed any further diagnostic evaluation thatmay be needed or ordered today. Jackee verbalizes understanding of the findings of todays visit. We also reviewed her medications today and discussed drug interactions and side effects including but not limited excessive drowsiness and altered mental states. We also discussed that there is always a risk not just to her but also people around her. she has been encouraged to call the office with any questions or concerns that should arise related to todays visit.  Orders Placed This Encounter  Procedures   Spirometry with Graph    Order Specific Question:   Where should this test be performed?    Answer:   [facility]    Order Specific Question:   Basic spirometry    Answer:   Yes     Time spent: 35  I have personally obtained a history, examined the  patient, evaluated laboratory and imaging results, formulated the assessment and plan and placed orders.    [name], MD [facility] Pulmonary and Critical Care Sleep medicine

## 2022-04-06 ENCOUNTER — Ambulatory Visit: Payer: Medicare Other | Admitting: Urology

## 2022-04-06 ENCOUNTER — Telehealth: Payer: Self-pay | Admitting: Family Medicine

## 2022-04-06 NOTE — Telephone Encounter (Signed)
Pt is calling to report that she went to [facility] on 04/02/22 she received her pneumonia, shingles vaccine.  Please advise [phone]

## 2022-04-06 NOTE — Telephone Encounter (Signed)
Noted and entered.  KP

## 2022-04-08 ENCOUNTER — Ambulatory Visit
Admission: RE | Admit: 2022-04-08 | Discharge: 2022-04-08 | Disposition: A | Discharge status: Home / Self Care | Payer: Medicare Other | Source: Ambulatory Visit | Attending: Urology | Admitting: Urology

## 2022-04-08 DIAGNOSIS — N393 Stress incontinence (female) (male): Secondary | ICD-10-CM | POA: Diagnosis not present

## 2022-04-08 DIAGNOSIS — K5730 Diverticulosis of large intestine without perforation or abscess without bleeding: Secondary | ICD-10-CM | POA: Diagnosis not present

## 2022-04-08 DIAGNOSIS — K8020 Calculus of gallbladder without cholecystitis without obstruction: Secondary | ICD-10-CM | POA: Diagnosis not present

## 2022-04-08 DIAGNOSIS — I2510 Atherosclerotic heart disease of native coronary artery without angina pectoris: Secondary | ICD-10-CM | POA: Diagnosis not present

## 2022-04-08 DIAGNOSIS — N362 Urethral caruncle: Secondary | ICD-10-CM | POA: Insufficient documentation

## 2022-04-08 DIAGNOSIS — R3129 Other microscopic hematuria: Secondary | ICD-10-CM | POA: Diagnosis not present

## 2022-04-08 DIAGNOSIS — Z8541 Personal history of malignant neoplasm of cervix uteri: Secondary | ICD-10-CM | POA: Insufficient documentation

## 2022-04-08 DIAGNOSIS — Z90711 Acquired absence of uterus with remaining cervical stump: Secondary | ICD-10-CM | POA: Diagnosis not present

## 2022-04-08 DIAGNOSIS — N939 Abnormal uterine and vaginal bleeding, unspecified: Secondary | ICD-10-CM | POA: Insufficient documentation

## 2022-04-08 DIAGNOSIS — R1030 Lower abdominal pain, unspecified: Secondary | ICD-10-CM | POA: Insufficient documentation

## 2022-04-08 DIAGNOSIS — Z8249 Family history of ischemic heart disease and other diseases of the circulatory system: Secondary | ICD-10-CM | POA: Insufficient documentation

## 2022-04-08 DIAGNOSIS — Z7982 Long term (current) use of aspirin: Secondary | ICD-10-CM | POA: Insufficient documentation

## 2022-04-08 DIAGNOSIS — I700 Atherosclerosis of aorta: Secondary | ICD-10-CM | POA: Insufficient documentation

## 2022-04-08 DIAGNOSIS — R319 Hematuria, unspecified: Secondary | ICD-10-CM | POA: Diagnosis not present

## 2022-04-08 MED ORDER — IOHEXOL 300 MG/ML  SOLN
125.0000 mL | Freq: Once | INTRAMUSCULAR | Status: AC | PRN
  Administered 2022-04-08: 125 mL via INTRAVENOUS

## 2022-04-15 ENCOUNTER — Other Ambulatory Visit: Payer: Self-pay | Admitting: Family Medicine

## 2022-04-15 DIAGNOSIS — Z1231 Encounter for screening mammogram for malignant neoplasm of breast: Secondary | ICD-10-CM

## 2022-04-20 ENCOUNTER — Ambulatory Visit: Payer: Medicare Other | Admitting: Urology

## 2022-04-20 ENCOUNTER — Encounter: Payer: Self-pay | Admitting: Urology

## 2022-04-20 ENCOUNTER — Telehealth: Payer: Self-pay | Admitting: Family Medicine

## 2022-04-20 VITALS — BP 145/64 | HR 63 | Ht 63.0 in | Wt 176.0 lb

## 2022-04-20 DIAGNOSIS — R3129 Other microscopic hematuria: Secondary | ICD-10-CM

## 2022-04-20 DIAGNOSIS — R310 Gross hematuria: Secondary | ICD-10-CM

## 2022-04-20 LAB — URINALYSIS, COMPLETE
Bilirubin, UA: NEGATIVE
Glucose, UA: NEGATIVE
Ketones, UA: NEGATIVE
Nitrite, UA: NEGATIVE
Protein,UA: NEGATIVE
Specific Gravity, UA: 1.01 (ref 1.005–1.030)
Urobilinogen, Ur: 0.2 mg/dL (ref 0.2–1.0)
pH, UA: 6 (ref 5.0–7.5)

## 2022-04-20 LAB — MICROSCOPIC EXAMINATION: WBC, UA: >30 /hpf — AB (ref 0–5)

## 2022-04-20 NOTE — Telephone Encounter (Signed)
Copied from [place] [id number]. Topic: General - Other >> Apr 20, 2022  2:36 PM Tessa Lerner A wrote: Reason for CRM: Patient has called to share that they got their tetanus, pneumonia and shingles vaccines on 04/02/22   The vaccinations were performed at [facility] in [facility]   Please contact further if needed

## 2022-04-20 NOTE — Telephone Encounter (Signed)
Noted   Tdap- entered   Pnuemonia and shingles was already entered.  KP

## 2022-04-20 NOTE — Progress Notes (Signed)
04/20/2022 1:25 PM   MINDI AKERSON October 03, 1941 [id number]  Referring provider: Juline Patch, MD [address] 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  She had a spot of blood on her underclothes about a month ago.  She has had a hysterectomy.  She may be having very thin yellowish-green discharge that is rare.  She wears a mini pad for mild stress incontinence sneezing.  She voids every 2 or 3 hours and gets up once or twice a night  Takes daily aspirin but no blood thinner.  No significant smoking history   Urinalysis dipstick February 04, 2022 showed no blood.  No recent renal x-ray.  Normal rectal examination by referring physician.  Negative blood in stool.    Very narrow introitus.  Mild urethral mucosal prolapse.  It was minimally congested  Patient had vaginal spotting.  It occurred once.  She never saw blood in the bowl. Interestingly she did wipe again today after she voided and there was a little bit of blood.  She had microscopic hematuria.  Call if culture positive.  Get CT scan and come back for repeat pelvic examination with small speculum and cystoscopy.   Today CT scan normal.  Culture negative.  No microscopic hematuria today on urinalysis.  Urine sent for culture Today on pelvic examination she had urethral mucosal prolapse.  On the right side near the hymenal ring she had a little bit of erythematous tissue that was benign but may have been the source of the bleeding or spot. Cystoscopy: Patient underwent flexible cystoscopy.  Bladder close and trigone were normal.  Urethra normal.  No cystitis or carcinoma.     PMH: Past Medical History:  Diagnosis Date   Anxiety    Asthma    Cancer ([place]) cervical-1973   COPD (chronic obstructive pulmonary disease) (HCC)    Depression    Diverticula, colon    Diverticulosis    GERD  (gastroesophageal reflux disease)    Sleep apnea     Surgical History: Past Surgical History:  Procedure Laterality Date   ABDOMINAL HYSTERECTOMY     COLONOSCOPY  06/08/2013   Dr Candace Cruise- small mouth diverticula   HIP SURGERY Left    KNEE SURGERY Bilateral    NASAL RECONSTRUCTION     x 2    Home Medications:  Allergies as of 04/20/2022       Reactions   Ciprofloxacin Other (See Comments)   Etodolac Other (See Comments)   Sulfa Antibiotics Rash        Medication List        Accurate as of April 20, 2022  1:25 PM. If you have any questions, ask your nurse or doctor.          acetaminophen 500 MG tablet Commonly known as: TYLENOL Take 500 mg by mouth every 6 (six) hours as needed.   aspirin 81 MG chewable tablet Chew 81 mg by mouth daily.   CALCIUM & MAGNESIUM CARBONATES PO Take 1 tablet by mouth daily.   CENTRUM SILVER PO Take 1 capsule by mouth daily.   EQ Loratadine 10 MG tablet Generic drug: loratadine Take 1 tablet by mouth once daily   EQUATE STOOL SOFTENER 100 MG capsule Generic drug: docusate sodium Take 1 capsule by mouth twice daily   FLUoxetine 10 MG capsule Commonly known as: PROZAC  Take 1 capsule (10 mg total) by mouth daily.   FLUoxetine 20 MG capsule Commonly known as: PROZAC Take 1 capsule (20 mg total) by mouth daily.   fluticasone 50 MCG/ACT nasal spray Commonly known as: FLONASE Use 2 sprays in each nostril daily   fluticasone furoate-vilanterol 100-25 MCG/ACT Aepb Commonly known as: BREO ELLIPTA Inhale 1 puff into the lungs daily.   gemfibrozil 600 MG tablet Commonly known as: LOPID Take 1 tablet (600 mg total) by mouth 2 (two) times daily.   ibuprofen 200 MG tablet Commonly known as: ADVIL Take 400 mg by mouth every 6 (six) hours as needed for moderate pain.   pantoprazole 40 MG tablet Commonly known as: PROTONIX Take 1 tablet (40 mg total) by mouth daily.   polyethylene glycol 17 g packet Commonly known as: MiraLax Take  17 g by mouth daily.        Allergies:  Allergies  Allergen Reactions   Ciprofloxacin Other (See Comments)   Etodolac Other (See Comments)   Sulfa Antibiotics Rash    Family History: Family History  Problem Relation Age of Onset   Cirrhosis Father    Coronary artery disease Mother    Breast cancer Neg Hx     Social History:  reports that she has quit smoking. She has never used smokeless tobacco. She reports that she does not currently use alcohol. She reports that she does not use drugs.  ROS:                                        Physical Exam: BP (!) 145/64   Pulse 63   Ht '5\' 3"'$  (1.6 m)   Wt 176 lb (79.8 kg)   BMI 31.18 kg/m   Constitutional:  Alert and oriented, No acute distress. HEENT: [place] AT, moist mucus membranes.  Trachea midline, no masses.   Laboratory Data: Lab Results  Component Value Date   WBC 7.2 12/20/2019   HGB 11.7 12/20/2019   HCT 34.3 12/20/2019   MCV 90 12/20/2019   PLT 316 12/20/2019    Lab Results  Component Value Date   CREATININE 0.75 03/31/2022    No results found for: PSA  No results found for: TESTOSTERONE  Lab Results  Component Value Date   HGBA1C 5.7 (H) 12/02/2015    Urinalysis    Component Value Date/Time   COLORURINE YELLOW (A) 07/20/2017 1345   APPEARANCEUR Clear 03/16/2022 1603   LABSPEC 1.014 07/20/2017 1345   PHURINE 6.0 07/20/2017 1345   GLUCOSEU Negative 03/16/2022 1603   HGBUR NEGATIVE 07/20/2017 1345   BILIRUBINUR Negative 03/16/2022 1603   KETONESUR NEGATIVE 07/20/2017 1345   PROTEINUR Negative 03/16/2022 1603   PROTEINUR NEGATIVE 07/20/2017 1345   UROBILINOGEN 0.2 02/04/2022 1141   NITRITE Negative 03/16/2022 1603   NITRITE NEGATIVE 07/20/2017 1345   LEUKOCYTESUR 2+ (A) 03/16/2022 1603    Pertinent Imaging:   Assessment & Plan: Patient has been cleared for spotting.  I will see her as needed  1. Microscopic hematuria  - Urinalysis, Complete   No follow-ups on  file.  Reece Packer, MD  [facility] [address] [place],  [address] [phone]

## 2022-04-29 LAB — CULTURE, URINE COMPREHENSIVE

## 2022-04-30 ENCOUNTER — Telehealth: Payer: Self-pay

## 2022-04-30 NOTE — Telephone Encounter (Signed)
-----   Message from Bjorn Loser, MD sent at 04/30/2022  9:24 AM EDT ----- Patient not having symptoms so I will not treat this positive culture.  It does not justify intravenous treatment.  You do not need to call her since she is not having symptoms.  Let me know if she called with UTI symptoms ----- Message ----- From: Alvera Novel, CMA Sent: 04/29/2022  11:12 AM EDT To: Bjorn Loser, MD   ----- Message ----- From: Interface, [facility] Lab Results In Sent: 04/20/2022   4:36 PM EDT To: [facility]

## 2022-05-18 ENCOUNTER — Ambulatory Visit
Admission: RE | Admit: 2022-05-18 | Discharge: 2022-05-18 | Disposition: A | Discharge status: Home / Self Care | Payer: Medicare Other | Source: Ambulatory Visit | Attending: Family Medicine | Admitting: Family Medicine

## 2022-05-18 DIAGNOSIS — Z1231 Encounter for screening mammogram for malignant neoplasm of breast: Secondary | ICD-10-CM | POA: Insufficient documentation

## 2022-06-11 DIAGNOSIS — G4733 Obstructive sleep apnea (adult) (pediatric): Secondary | ICD-10-CM | POA: Diagnosis not present

## 2022-06-12 ENCOUNTER — Ambulatory Visit: Payer: Self-pay | Admitting: *Deleted

## 2022-06-12 ENCOUNTER — Telehealth: Payer: Self-pay

## 2022-06-12 NOTE — Telephone Encounter (Signed)
Pt called wanting to know what to take for mucus in chest- advised her to use mucinex

## 2022-06-12 NOTE — Telephone Encounter (Signed)
  Chief Complaint: would like to know which OTC medication she should use with her prescribed meds for breaking up mucus in lungs  Symptoms: mild chest pain when coughing . Unable to cough up mucus after using CPAP machine at night.  Frequency: couple of weeks now  Pertinent Negatives: Patient denies chest pain now. No difficulty breathing. No fever Disposition: '[]'$ ED /'[]'$ Urgent Care (no appt availability in office) / '[]'$ Appointment(In office/virtual)/ '[]'$  [place] [facility]/ '[]'$ Home Care/ '[]'$ Refused Recommended Disposition /'[]'$ [place] [facility]/ '[x]'$  Follow-up with PCP Additional Notes:   Please advise which OTC medication will be best for patient to take to help loosen mucus in chest . Patient would like a call back.    Reason for Disposition  Chest pain(s) lasting a few seconds from coughing  Answer Assessment - Initial Assessment Questions 1. LOCATION: "Where does it hurt?"       Middle of chest  2. RADIATION: "Does the pain go anywhere else?" (e.g., into neck, jaw, arms, back)     Na  3. ONSET: "When did the chest pain begin?" (Minutes, hours or days)      Couple of weeks ago  4. PATTERN: "Does the pain come and go, or has it been constant since it started?"  "Does it get worse with exertion?"      Pain with coughing and can not get mucus out  5. DURATION: "How long does it last" (e.g., seconds, minutes, hours)     While coughing  6. SEVERITY: "How bad is the pain?"  (e.g., Scale 1-10; mild, moderate, or severe)    - MILD (1-3): doesn't interfere with normal activities     - MODERATE (4-7): interferes with normal activities or awakens from sleep    - SEVERE (8-10): excruciating pain, unable to do any normal activities       na 7. CARDIAC RISK FACTORS: "Do you have any history of heart problems or risk factors for heart disease?" (e.g., angina, prior heart attack; diabetes, high blood pressure, high cholesterol, smoker, or strong family history of heart disease)     See hx  8.  PULMONARY RISK FACTORS: "Do you have any history of lung disease?"  (e.g., blood clots in lung, asthma, emphysema, birth control pills)     See hx  9. CAUSE: "What do you think is causing the chest pain?"     Trying to cough up mucus and can not cough it up 10. OTHER SYMPTOMS: "Do you have any other symptoms?" (e.g., dizziness, nausea, vomiting, sweating, fever, difficulty breathing, cough)     After using CPAP machine wakes up and can not cough up mucus and that is when chest pain noted  11. PREGNANCY: "Is there any chance you are pregnant?" "When was your last menstrual period?"       na  Protocols used: Chest Pain-A-AH

## 2022-06-29 ENCOUNTER — Encounter: Payer: Self-pay | Admitting: Emergency Medicine

## 2022-06-29 ENCOUNTER — Emergency Department
Admission: EM | Admit: 2022-06-29 | Discharge: 2022-06-30 | Disposition: A | Discharge status: Home / Self Care | Payer: Medicare Other | Attending: Emergency Medicine | Admitting: Emergency Medicine

## 2022-06-29 ENCOUNTER — Emergency Department: Payer: Medicare Other

## 2022-06-29 DIAGNOSIS — J449 Chronic obstructive pulmonary disease, unspecified: Secondary | ICD-10-CM | POA: Diagnosis not present

## 2022-06-29 DIAGNOSIS — W010XXA Fall on same level from slipping, tripping and stumbling without subsequent striking against object, initial encounter: Secondary | ICD-10-CM | POA: Diagnosis not present

## 2022-06-29 DIAGNOSIS — W19XXXA Unspecified fall, initial encounter: Secondary | ICD-10-CM

## 2022-06-29 DIAGNOSIS — M533 Sacrococcygeal disorders, not elsewhere classified: Secondary | ICD-10-CM | POA: Diagnosis not present

## 2022-06-29 DIAGNOSIS — J45909 Unspecified asthma, uncomplicated: Secondary | ICD-10-CM | POA: Diagnosis not present

## 2022-06-29 DIAGNOSIS — S300XXA Contusion of lower back and pelvis, initial encounter: Secondary | ICD-10-CM

## 2022-06-29 DIAGNOSIS — M545 Low back pain, unspecified: Secondary | ICD-10-CM | POA: Diagnosis present

## 2022-06-29 NOTE — ED Triage Notes (Addendum)
Pt repots she tripped in her rubber shoes today and fell onto carpeted floor with right leg underneath herself. Pt denies LOC and denies hitting head. Fall happened approx 1630 today. Pt took Advil, unknown mg at 1630 and 2030.  Pt c/o sacrum/coccyx pain that radiates over into right buttocks.

## 2022-06-30 MED ORDER — TRAMADOL HCL 50 MG PO TABS: 50.0000 mg | ORAL_TABLET | Freq: Four times a day (QID) | ORAL | 0 refills | Status: DC | PRN

## 2022-06-30 MED ORDER — TRAMADOL HCL 50 MG PO TABS
100.0000 mg | ORAL_TABLET | Freq: Once | ORAL | Status: AC
  Administered 2022-06-30: 100 mg via ORAL
  Filled 2022-06-30: qty 2

## 2022-06-30 NOTE — ED Provider Notes (Signed)
   [facility] Provider Note    Event Date/Time   First MD Initiated Contact with Patient 06/29/22 2356     (approximate)  History   Chief Complaint: Fall  HPI  Regina Macdonald is a 81 y.o. female with a past medical history of COPD, gastric reflux, anxiety, asthma, presents to the emergency department after a fall.  According to the patient she had a mechanical fall landing on her buttocks and right side.  Patient is complaining of pain to the tailbone and into the right buttock somewhat.  Patient denies hitting her head.  Denies LOC.  Patient is able to walk but with discomfort.  Physical Exam   Triage Vital Signs: ED Triage Vitals [06/29/22 2150]  Enc Vitals Group     BP (!) 167/77     Pulse Rate 71     Resp 17     Temp 98.2 F (36.8 C)     Temp Source Oral     SpO2 97 %     Weight 175 lb (79.4 kg)     Height '5\' 3"'$  (1.6 m)     Head Circumference      Peak Flow      Pain Score      Pain Loc      Pain Edu?      Excl. in [place]?     Most recent vital signs: Vitals:   06/29/22 2150  BP: (!) 167/77  Pulse: 71  Resp: 17  Temp: 98.2 F (36.8 C)  SpO2: 97%    General: Awake, no distress.  CV:  Good peripheral perfusion.  Regular rate and rhythm  Resp:  Normal effort.  Equal breath sounds bilaterally.  Abd:  No distention.  Soft, nontender.  No rebound or guarding. Other:  Good range of motion in bilateral lower extremities.  Patient is complaining of pain over the tailbone area.   ED Results / Procedures / Treatments   RADIOLOGY  I have reviewed and interpreted the pelvis x-ray I do not see any bony abnormality or fracture of the pelvis or coccyx. Radiology is read the x-ray is negative for acute fracture.  MEDICATIONS ORDERED IN ED: Medications  traMADol (ULTRAM) tablet 100 mg (has no administration in time range)     IMPRESSION / MDM / ASSESSMENT AND PLAN / ED COURSE  I reviewed the triage vital signs and the nursing  notes.  Patient's presentation is most consistent with acute presentation with potential threat to life or bodily function.  Patient presents emergency department after mechanical fall.  Patient is having pain in her buttocks.  X-rays do not appear to show any obvious abnormality.  Suspect patient could have a small fracture through the coccyx or contusion.  Do not see any signs of pelvis fracture on my evaluation patient has good range of motion bilateral lower extremities.  Discussed with the patient orthopedic follow-up for recheck/reevaluation.  We will also prescribe tramadol to be used on top of Tylenol if needed.  Family agreeable to plan of care.  FINAL CLINICAL IMPRESSION(S) / ED DIAGNOSES   Tailbone pain Fall    Note:  This document was prepared using Dragon voice recognition software and may include unintentional dictation errors.   Harvest Dark, MD 06/30/22 [phone]

## 2022-07-02 ENCOUNTER — Ambulatory Visit (INDEPENDENT_AMBULATORY_CARE_PROVIDER_SITE_OTHER): Payer: Medicare Other | Admitting: Family Medicine

## 2022-07-02 ENCOUNTER — Encounter: Payer: Self-pay | Admitting: Family Medicine

## 2022-07-02 ENCOUNTER — Ambulatory Visit
Admission: RE | Admit: 2022-07-02 | Discharge: 2022-07-02 | Disposition: A | Discharge status: Home / Self Care | Payer: Medicare Other | Attending: Family Medicine | Admitting: Family Medicine

## 2022-07-02 ENCOUNTER — Ambulatory Visit
Admission: RE | Admit: 2022-07-02 | Discharge: 2022-07-02 | Disposition: A | Discharge status: Home / Self Care | Payer: Medicare Other | Source: Ambulatory Visit | Attending: Family Medicine | Admitting: Family Medicine

## 2022-07-02 ENCOUNTER — Ambulatory Visit: Payer: Self-pay | Admitting: *Deleted

## 2022-07-02 VITALS — BP 120/64 | HR 64 | Ht 63.0 in | Wt 175.0 lb

## 2022-07-02 DIAGNOSIS — M25551 Pain in right hip: Secondary | ICD-10-CM

## 2022-07-02 NOTE — Progress Notes (Signed)
Date:  07/02/2022   Name:  Regina Macdonald   DOB:  [birth year]   MRN:  [id number]   Chief Complaint: Hip Pain (R) hip pain s/p fall- was in a hurry and shoe gripped the floor and she fell on R) side- happened Monday 06/29/2022 @ 4:30 in the evening. Went to the ER but xray)  Hip Pain  The incident occurred 3 to 5 days ago (monday). The incident occurred at home. The injury mechanism was a fall. The pain is present in the right thigh (right pelvic). The quality of the pain is described as aching. The pain is moderate. The pain has been Intermittent since onset. The symptoms are aggravated by palpation and movement.    Lab Results  Component Value Date   NA 138 03/31/2022   K 4.4 03/31/2022   CO2 23 03/31/2022   GLUCOSE 69 (L) 03/31/2022   BUN 15 03/31/2022   CREATININE 0.75 03/31/2022   CALCIUM 9.2 03/31/2022   EGFR 79 03/31/2022   GFRNONAA 60 01/24/2021   Lab Results  Component Value Date   CHOL 138 09/05/2021   HDL 38 (L) 09/05/2021   LDLCALC 87 09/05/2021   TRIG 65 09/05/2021   CHOLHDL 3.0 08/19/2017   Lab Results  Component Value Date   TSH 3.16 01/24/2021   Lab Results  Component Value Date   HGBA1C 5.7 (H) 12/02/2015   Lab Results  Component Value Date   WBC 7.2 12/20/2019   HGB 11.7 12/20/2019   HCT 34.3 12/20/2019   MCV 90 12/20/2019   PLT 316 12/20/2019   Lab Results  Component Value Date   ALT 9 03/31/2022   AST 22 03/31/2022   ALKPHOS 127 (H) 03/31/2022   BILITOT 0.3 03/31/2022   No results found for: "25OHVITD2", "25OHVITD3", "VD25OH"   Review of Systems  Constitutional:  Negative for chills and fever.  HENT:  Negative for drooling, ear discharge, ear pain and sore throat.   Respiratory:  Negative for cough, shortness of breath and wheezing.   Cardiovascular:  Negative for chest pain, palpitations and leg swelling.  Gastrointestinal:  Positive for constipation. Negative for abdominal pain, blood in stool, diarrhea and nausea.  Endocrine:  Negative for polydipsia.  Genitourinary:  Negative for dysuria, frequency, hematuria and urgency.  Musculoskeletal:  Negative for back pain, myalgias and neck pain.  Skin:  Negative for rash.  Allergic/Immunologic: Negative for environmental allergies.  Neurological:  Negative for dizziness and headaches.  Hematological:  Does not bruise/bleed easily.  Psychiatric/Behavioral:  Negative for suicidal ideas. The patient is not nervous/anxious.     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   Anxiety 03/23/2018   Taking medication for chronic disease 03/23/2018   Other constipation 12/13/2017   Depression with anxiety 08/19/2017   Hyperlipidemia 08/19/2017   Gastroesophageal reflux disease without esophagitis 08/19/2017   Other chest pain 04/16/2015   COPD exacerbation ([place]) 04/16/2015   Sleep apnea 04/16/2015   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   Asthmatic bronchitis 04/16/2015   Abdominal pain, LLQ 03/29/2014   Rectal bleeding 03/29/2014   Arthritis, 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   Osteoarthrosis, unspecified whether generalized or localized, pelvic region and thigh 03/26/2014    Allergies  Allergen Reactions   Ciprofloxacin Other (See Comments)   Etodolac  Other (See Comments)   Sulfa Antibiotics Rash    Past Surgical History:  Procedure Laterality Date   ABDOMINAL HYSTERECTOMY     COLONOSCOPY  06/08/2013   Dr Candace Cruise- small mouth diverticula   HIP SURGERY Left    KNEE SURGERY Bilateral    NASAL RECONSTRUCTION     x 2    Social History   Tobacco Use   Smoking status: Former   Smokeless tobacco: Never  Vaping Use   Vaping Use: Never used  Substance Use Topics   Alcohol use: Not Currently   Drug use: Never     Medication list has been  reviewed and updated.  Current Meds  Medication Sig   acetaminophen (TYLENOL) 500 MG tablet Take 500 mg by mouth every 6 (six) hours as needed.   aspirin 81 MG chewable tablet Chew 81 mg by mouth daily.   CALCIUM & MAGNESIUM CARBONATES PO Take 1 tablet by mouth daily.   EQ LORATADINE 10 MG tablet Take 1 tablet by mouth once daily   EQUATE STOOL SOFTENER 100 MG capsule Take 1 capsule by mouth twice daily   FLUoxetine (PROZAC) 10 MG capsule Take 1 capsule (10 mg total) by mouth daily.   FLUoxetine (PROZAC) 20 MG capsule Take 1 capsule (20 mg total) by mouth daily.   fluticasone (FLONASE) 50 MCG/ACT nasal spray Use 2 sprays in each nostril daily   fluticasone furoate-vilanterol (BREO ELLIPTA) 100-25 MCG/ACT AEPB Inhale 1 puff into the lungs daily.   gemfibrozil (LOPID) 600 MG tablet Take 1 tablet (600 mg total) by mouth 2 (two) times daily.   ibuprofen (ADVIL) 200 MG tablet Take 400 mg by mouth every 6 (six) hours as needed for moderate pain.   Multiple Vitamins-Minerals (CENTRUM SILVER PO) Take 1 capsule by mouth daily.   pantoprazole (PROTONIX) 40 MG tablet Take 1 tablet (40 mg total) by mouth daily.   polyethylene glycol (MIRALAX) packet Take 17 g by mouth daily.   traMADol (ULTRAM) 50 MG tablet Take 1 tablet (50 mg total) by mouth every 6 (six) hours as needed.       07/02/2022    3:41 PM 03/31/2022   10:57 AM 01/02/2022    1:35 PM 12/05/2021    1:54 PM  GAD 7 : Generalized Anxiety Score  Nervous, Anxious, on Edge 2 0 0 0  Control/stop worrying 3 0 0 0  Worry too much - different things 3 0 0 0  Trouble relaxing 3 0 0 0  Restless 3 0 0 0  Easily annoyed or irritable 3 0 0 0  Afraid - awful might happen 3 0 0 0  Total GAD 7 Score 20 0 0 0  Anxiety Difficulty Not difficult at all Not difficult at all Not difficult at all Not difficult at all       07/02/2022    3:40 PM 03/31/2022   10:57 AM 01/02/2022    1:35 PM  Depression screen PHQ 2/9  Decreased Interest 3 0 0  Down,  Depressed, Hopeless 3 0 0  PHQ - 2 Score 6 0 0  Altered sleeping 3 0 0  Tired, decreased energy 3 0 0  Change in appetite 3 0 0  Feeling bad or failure about yourself  3 0 0  Trouble concentrating 3 0 0  Moving slowly or fidgety/restless 3 0 0  Suicidal thoughts 0 0 0  PHQ-9 Score 24 0 0  Difficult doing work/chores Extremely dIfficult Not difficult at all Not difficult at  all    BP Readings from Last 3 Encounters:  07/02/22 120/64  06/30/22 (!) 143/81  04/20/22 (!) 145/64    Physical Exam Vitals and nursing note reviewed. Exam conducted with a chaperone present.  Constitutional:      General: She is not in acute distress.    Appearance: She is not diaphoretic.  HENT:     Head: Normocephalic and atraumatic.     Right Ear: Tympanic membrane and external ear normal.     Left Ear: Tympanic membrane and external ear normal.     Nose: Nose normal.     Mouth/Throat:     Mouth: Mucous membranes are moist.  Eyes:     General:        Right eye: No discharge.        Left eye: No discharge.     Conjunctiva/sclera: Conjunctivae normal.     Pupils: Pupils are equal, round, and reactive to light.  Neck:     Thyroid: No thyromegaly.     Vascular: No JVD.  Cardiovascular:     Rate and Rhythm: Normal rate and regular rhythm.     Pulses:          Dorsalis pedis pulses are 1+ on the right side and 1+ on the left side.       Posterior tibial pulses are 1+ on the right side and 1+ on the left side.     Heart sounds: Normal heart sounds. No murmur heard.    No friction rub. No gallop.  Pulmonary:     Effort: Pulmonary effort is normal.     Breath sounds: Normal breath sounds. No wheezing or rhonchi.  Abdominal:     General: Bowel sounds are normal.     Palpations: Abdomen is soft. There is no mass.     Tenderness: There is no abdominal tenderness. There is no guarding.  Musculoskeletal:        General: Normal range of motion.     Cervical back: Normal range of motion and neck  supple.     Right hip: Tenderness and bony tenderness present. No crepitus. Normal range of motion.     Right upper leg: Tenderness present.     Comments: Tender right ischium/tenderness of right hip with pain on internal rotation  Lymphadenopathy:     Cervical: No cervical adenopathy.  Skin:    General: Skin is warm and dry.  Neurological:     Mental Status: She is alert.     Deep Tendon Reflexes: Reflexes are normal and symmetric.     Wt Readings from Last 3 Encounters:  07/02/22 175 lb (79.4 kg)  06/29/22 175 lb (79.4 kg)  04/20/22 176 lb (79.8 kg)    BP 120/64   Pulse 64   Ht '5\' 3"'  (1.6 m)   Wt 175 lb (79.4 kg)   BMI 31.00 kg/m   Assessment and Plan:     Otilio Miu, MD

## 2022-07-02 NOTE — Telephone Encounter (Signed)
  Chief Complaint: Right hip pain after a fall on Monday.   Seen in ED.   No breaks Symptoms: pain in right side of hip.   Difficult to walk but can ambulate Frequency: Since Monday Pertinent Negatives: Patient denies having any breaks or fractures after ED visit. Disposition: '[]'$ ED /'[]'$ Urgent Care (no appt availability in office) / '[x]'$ Appointment(In office/virtual)/ '[]'$  [place] [facility]/ '[]'$ Home Care/ '[]'$ Refused Recommended Disposition /'[]'$  Mobile Bus/ '[]'$  Follow-up with PCP Additional Notes: Appt. Made for today with Dr. Ronnald Ramp.   Pt is wanting a referral to the [facility] for orthopedic dr.

## 2022-07-02 NOTE — Telephone Encounter (Signed)
Reason for Disposition  [1] SEVERE pain (e.g., excruciating, unable to do any normal activities) AND [2] not improved after 2 hours of pain medicine  Answer Assessment - Initial Assessment Questions 1. LOCATION and RADIATION: "Where is the pain located?"      [place] on Monday and went to ED.   Hurt side area of right hip.    Can Dr. Ronnald Ramp refer me to an orthopedic dr.     [facility] is where they referred me to.    2. QUALITY: "What does the pain feel like?"  (e.g., sharp, dull, aching, burning)     Sharp pain that comes and goes 3. SEVERITY: "How bad is the pain?" "What does it keep you from doing?"   (Scale 1-10; or mild, moderate, severe)   -  MILD (1-3): doesn't interfere with normal activities    -  MODERATE (4-7): interferes with normal activities (e.g., work or school) or awakens from sleep, limping    -  SEVERE (8-10): excruciating pain, unable to do any normal activities, unable to walk     Sharp pain 4. ONSET: "When did the pain start?" "Does it come and go, or is it there all the time?"     Fell on Monday.   I had on rubber shoes and I was in a hurry and I caught my shoes and fell.    I was going to the kitchen and fell on my right side.    5. WORK OR EXERCISE: "Has there been any recent work or exercise that involved this part of the body?"      A fall 6. CAUSE: "What do you think is causing the hip pain?"      I fell 7. AGGRAVATING FACTORS: "What makes the hip pain worse?" (e.g., walking, climbing stairs, running)     Moving and walking  8. OTHER SYMPTOMS: "Do you have any other symptoms?" (e.g., back pain, pain shooting down leg,  fever, rash)     No other injuries.    X rays taken in ED no breaks or fractures.  Protocols used: Hip Pain-A-AH

## 2022-07-07 DIAGNOSIS — S7001XA Contusion of right hip, initial encounter: Secondary | ICD-10-CM | POA: Diagnosis not present

## 2022-07-09 ENCOUNTER — Telehealth: Payer: Self-pay | Admitting: Family Medicine

## 2022-07-09 NOTE — Telephone Encounter (Signed)
Patient has called to say please disregard their request for contact

## 2022-07-09 NOTE — Telephone Encounter (Signed)
Copied from [place] [phone]. Topic: Appointment Scheduling - Scheduling Inquiry for Clinic >> Jul 09, 2022 11:46 AM [place] wrote: Reason for CRM: Pt wants to speak to [facility] regarding home health nursing assistance  Best contact: [phone]

## 2022-07-15 ENCOUNTER — Telehealth: Payer: Self-pay | Admitting: *Deleted

## 2022-07-15 NOTE — Telephone Encounter (Signed)
  Chief Complaint: Medication Question Symptoms: Pain Frequency: NA Pertinent Negatives: Patient denies NA Disposition: '[]'$ ED /'[]'$ Urgent Care (no appt availability in office) / '[]'$ Appointment(In office/virtual)/ '[]'$  [place] [facility]/ '[]'$ Home Care/ '[]'$ Refused Recommended Disposition /'[]'$ [place] [facility]/ '[x]'$  Follow-up with PCP Additional Notes: Pt reports she took her last Tramadol at 3 PM today. Pt is questioning if she can take OTC   ES Tylenol and Aleve at same time for pain control. Advised pt NT would route to practice for PCPs review. Please advise.

## 2022-07-16 ENCOUNTER — Telehealth: Payer: Self-pay

## 2022-07-16 NOTE — Telephone Encounter (Signed)
Called and left message after receiving CRM on pt wanting to know if she can take Tylenol with Aleve. She has been advised to only take Tylenol unless Dr Posey Pronto or Reche Dixon tells hers otherwise. I left both of their phone numbers on the message

## 2022-07-21 ENCOUNTER — Emergency Department: Payer: Medicare Other

## 2022-07-21 ENCOUNTER — Emergency Department
Admission: EM | Admit: 2022-07-21 | Discharge: 2022-07-21 | Disposition: A | Discharge status: Home / Self Care | Payer: Medicare Other | Attending: Emergency Medicine | Admitting: Emergency Medicine

## 2022-07-21 ENCOUNTER — Ambulatory Visit: Payer: Self-pay | Admitting: *Deleted

## 2022-07-21 ENCOUNTER — Other Ambulatory Visit: Payer: Self-pay

## 2022-07-21 DIAGNOSIS — R9431 Abnormal electrocardiogram [ECG] [EKG]: Secondary | ICD-10-CM | POA: Diagnosis not present

## 2022-07-21 DIAGNOSIS — M25551 Pain in right hip: Secondary | ICD-10-CM | POA: Diagnosis not present

## 2022-07-21 DIAGNOSIS — R4701 Aphasia: Secondary | ICD-10-CM | POA: Diagnosis present

## 2022-07-21 DIAGNOSIS — R4781 Slurred speech: Secondary | ICD-10-CM | POA: Diagnosis not present

## 2022-07-21 DIAGNOSIS — G319 Degenerative disease of nervous system, unspecified: Secondary | ICD-10-CM | POA: Diagnosis not present

## 2022-07-21 DIAGNOSIS — J449 Chronic obstructive pulmonary disease, unspecified: Secondary | ICD-10-CM | POA: Insufficient documentation

## 2022-07-21 DIAGNOSIS — I6782 Cerebral ischemia: Secondary | ICD-10-CM | POA: Diagnosis not present

## 2022-07-21 DIAGNOSIS — R4789 Other speech disturbances: Secondary | ICD-10-CM | POA: Insufficient documentation

## 2022-07-21 DIAGNOSIS — J320 Chronic maxillary sinusitis: Secondary | ICD-10-CM | POA: Diagnosis not present

## 2022-07-21 LAB — COMPREHENSIVE METABOLIC PANEL
ALT: 16 U/L (ref 0–44)
AST: 28 U/L (ref 15–41)
Albumin: 4.4 g/dL (ref 3.5–5.0)
Alkaline Phosphatase: 242 U/L — ABNORMAL HIGH (ref 38–126)
Anion gap: 12 (ref 5–15)
BUN: 15 mg/dL (ref 8–23)
CO2: 24 mmol/L (ref 22–32)
Calcium: 9.4 mg/dL (ref 8.9–10.3)
Chloride: 105 mmol/L (ref 98–111)
Creatinine, Ser: 0.82 mg/dL (ref 0.44–1.00)
GFR, Estimated: >60 mL/min (ref >60)
Glucose, Bld: 102 mg/dL — ABNORMAL HIGH (ref 70–99)
Potassium: 3.7 mmol/L (ref 3.5–5.1)
Sodium: 141 mmol/L (ref 135–145)
Total Bilirubin: 0.8 mg/dL (ref 0.3–1.2)
Total Protein: 7.9 g/dL (ref 6.5–8.1)

## 2022-07-21 LAB — CBC
HCT: 36.5 % (ref 36.0–46.0)
Hemoglobin: 11.9 g/dL — ABNORMAL LOW (ref 12.0–15.0)
MCH: 30.4 pg (ref 26.0–34.0)
MCHC: 32.6 g/dL (ref 30.0–36.0)
MCV: 93.4 fL (ref 80.0–100.0)
Platelets: 333 10*3/uL (ref 150–400)
RBC: 3.91 MIL/uL (ref 3.87–5.11)
RDW: 12.7 % (ref 11.5–15.5)
WBC: 8.8 10*3/uL (ref 4.0–10.5)
nRBC: 0 % (ref 0.0–0.2)

## 2022-07-21 LAB — PROTIME-INR
INR: 1.1 (ref 0.8–1.2)
Prothrombin Time: 14.1 seconds (ref 11.4–15.2)

## 2022-07-21 LAB — ETHANOL: Alcohol, Ethyl (B): <10 mg/dL (ref <10)

## 2022-07-21 LAB — DIFFERENTIAL
Abs Immature Granulocytes: 0.03 10*3/uL (ref 0.00–0.07)
Basophils Absolute: 0.1 10*3/uL (ref 0.0–0.1)
Basophils Relative: 1 %
Eosinophils Absolute: 0.2 10*3/uL (ref 0.0–0.5)
Eosinophils Relative: 2 %
Immature Granulocytes: 0 %
Lymphocytes Relative: 20 %
Lymphs Abs: 1.8 10*3/uL (ref 0.7–4.0)
Monocytes Absolute: 0.5 10*3/uL (ref 0.1–1.0)
Monocytes Relative: 6 %
Neutro Abs: 6.2 10*3/uL (ref 1.7–7.7)
Neutrophils Relative %: 71 %

## 2022-07-21 LAB — APTT: aPTT: 32 seconds (ref 24–36)

## 2022-07-21 MED ORDER — SODIUM CHLORIDE 0.9% FLUSH: 3.0000 mL | Freq: Once | INTRAVENOUS | Status: DC

## 2022-07-21 NOTE — Telephone Encounter (Signed)
Noted  KP 

## 2022-07-21 NOTE — ED Notes (Signed)
Patient discharged at this time. Wheeled to lobby with family. Breathing unlabored speaking in full sentences. Verbalized understanding of all discharge, follow up, and medication teaching. Discharged homed with all belongings.

## 2022-07-21 NOTE — ED Notes (Signed)
Assumed care, pt in NAD states speech has improved over the last week but still has some bouts of slurring. Saw PCP for hip pain and no fx. Family at bedside will monitor.

## 2022-07-21 NOTE — ED Provider Notes (Signed)
[facility] Provider Note    Event Date/Time   First MD Initiated Contact with Patient 07/21/22 1658     (approximate)   History   Fall and Aphasia   HPI  Regina Macdonald is a 81 y.o. female past medical history of COPD, GERD, sleep apnea anxiety asthma who presents with difficulty speaking.  Last Wednesday patient notes that she was trying to speak with her daughter and was having trouble getting her words out felt she was slurring her speech.  Since that time it has been coming and going.  She denies any change numbness tingling or weakness.  She did have a fall about 3 weeks ago onto her right buttock was seen in the ED and has seen orthopedics since.  She has some ongoing right hip pain.  Nuys other illnesses fevers chills chest pain shortness of breath.     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   Anxiety 03/23/2018   Taking medication for chronic disease 03/23/2018   Other constipation 12/13/2017   Depression with anxiety 08/19/2017   Hyperlipidemia 08/19/2017   Gastroesophageal reflux disease without esophagitis 08/19/2017   Other chest pain 04/16/2015   COPD exacerbation ([place]) 04/16/2015   Sleep apnea 04/16/2015   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   Asthmatic bronchitis 04/16/2015   Abdominal pain, LLQ 03/29/2014   Rectal bleeding 03/29/2014   Arthritis, 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   Osteoarthrosis, unspecified whether generalized or localized,  pelvic region and thigh 03/26/2014     Physical Exam  Triage Vital Signs: ED Triage Vitals [07/21/22 1207]  Enc Vitals Group     BP (!) 128/115     Pulse Rate 78     Resp 16     Temp 97.8 F (36.6 C)     Temp Source Oral     SpO2 97 %     Weight      Height      Head Circumference      Peak Flow      Pain Score      Pain Loc      Pain Edu?      Excl. in [place]?     Most recent vital signs: Vitals:   07/21/22 1900 07/21/22 1930  BP: 134/73 (!) 158/66  Pulse: 65 64  Resp:    Temp:    SpO2: 97% 94%     General: Awake, no distress.  CV:  Good peripheral perfusion.  Resp:  Normal effort.  Abd:  No distention.  Neuro:             Awake, Alert, Oriented x 3  Other:  Aox3 Patient intermittently has to midsentence to get her words correct but she is able to repeat and name normally PERRL, EOMI, face symmetric, nml tongue movement  5/5 strength in the BL upper and lower extremities  Sensation grossly intact in the BL upper and lower extremities  Finger-nose-finger intact BL  ED Results / Procedures / Treatments  Labs (all labs ordered are listed, but only abnormal results are displayed) Labs Reviewed  CBC - Abnormal; Notable for the following components:      Result Value   Hemoglobin 11.9 (*)    All other components within normal limits  COMPREHENSIVE METABOLIC PANEL - Abnormal; Notable for the following components:   Glucose, Bld 102 (*)    Alkaline Phosphatase 242 (*)    All other components within normal limits  PROTIME-INR  APTT  DIFFERENTIAL  ETHANOL  I-STAT CREATININE, ED  CBG MONITORING, ED     EKG  EKG interpretation performed by myself: NSR, nml axis, nml intervals, no acute ischemic changes    RADIOLOGY I reviewed and interpreted the CT scan of the brain which does not show any acute intracranial process    PROCEDURES:  Critical Care performed: No  Procedures  The patient is on the cardiac monitor to evaluate for evidence of  arrhythmia and/or significant heart rate changes.   MEDICATIONS ORDERED IN ED: Medications  sodium chloride flush (NS) 0.9 % injection 3 mL (3 mLs Intravenous Not Given 07/21/22 1703)     IMPRESSION / MDM / ASSESSMENT AND PLAN / ED COURSE  I reviewed the triage vital signs and the nursing notes.                              Patient's presentation is most consistent with acute presentation with potential threat to life or bodily function.  Differential diagnosis includes, but is not limited to, CVA, delirium, dementia, TIA, concussion  Patient is a 81 year old female presents with word finding difficulty.  She noticed this last Wednesday and seems to be coming and going is not consistent.  Family numbers with her says that she intermittently seems to stutter and has to catch her self.  On exam when I am talking to her occasionally she does not get her words out just right has to stop and start again.  Although then she will have times when she speaks slowly and her naming and repetition is intact.  Rest of her neurologic exam is nonfocal.  Given the questionable aphasia with no acute finding on CT head we will obtain MRI brain to rule out stroke.    Patient's MRI is negative for any acute or subacute stroke.  Given this she is appropriate for discharge.   FINAL CLINICAL IMPRESSION(S) / ED DIAGNOSES   Final diagnoses:  Word finding difficulty     Rx / DC Orders   ED Discharge Orders     None        Note:  This document was prepared using Dragon voice recognition software and may include unintentional dictation errors.   Rada Hay, MD 07/21/22 2101

## 2022-07-21 NOTE — Telephone Encounter (Signed)
  Chief Complaint: slurred speech at times now gone  Symptoms: fell last week and has noted a couple of episodes of slurred speech or word finding difficulties last week no sx now.  Frequency: last week  Pertinent Negatives: Patient denies chest pain no difficulty breathing  Disposition: '[x]'$ ED /'[]'$ Urgent Care (no appt availability in office) / '[]'$ Appointment(In office/virtual)/ '[]'$  [facility]/ '[]'$ Home Care/ '[]'$ Refused Recommended Disposition /'[]'$ [facility]/ '[]'$  Follow-up with PCP Additional Notes:   Recommended ED esp if sx return. Appt already scheduled for 07/23/22 . Please advise if earlier appt available or to go to ED.    Reason for Disposition  [1] Loss of speech or garbled speech AND [2] sudden onset AND [3] brief (now gone)  Answer Assessment - Initial Assessment Questions 1. SYMPTOM: "What is the main symptom you are concerned about?" (e.g., weakness, numbness)     Slurred speech  2. ONSET: "When did this start?" (minutes, hours, days; while sleeping)     Last week  3. LAST NORMAL: "When was the last time you (the patient) were normal (no symptoms)?"     No sx now  4. PATTERN "Does this come and go, or has it been constant since it started?"  "Is it present now?"     Comes and goes  5. CARDIAC SYMPTOMS: "Have you had any of the following symptoms: chest pain, difficulty breathing, palpitations?"     No  6. NEUROLOGIC SYMPTOMS: "Have you had any of the following symptoms: headache, dizziness, vision loss, double vision, changes in speech, unsteady on your feet?"     Slurred speech , hard to get words out  7. OTHER SYMPTOMS: "Do you have any other symptoms?"     None  8. PREGNANCY: "Is there any chance you are pregnant?" "When was your last menstrual period?"     na  Protocols used: Neurologic Deficit-A-AH

## 2022-07-21 NOTE — ED Notes (Signed)
Agree with previous RN, pt alert and oriented in NAD breathing e/u on room air with no needs. Family at bedside.

## 2022-07-21 NOTE — Telephone Encounter (Signed)
Patient called and she says she's going into the emergency room now that she received the message to go.

## 2022-07-21 NOTE — ED Triage Notes (Addendum)
Pt comes with c/o slurred speech week ago. Pt states she also had a fall and hurt her right side few weeks ago. Pt states the pain makes it harder to walk. Pt denies any dizziness, headache, blurry vision. Mini neuro neg at this time.  Pt speaking in complete sentences at this time.

## 2022-07-21 NOTE — Telephone Encounter (Signed)
Called pt left VM to go to the ED.  KP

## 2022-07-21 NOTE — Discharge Instructions (Signed)
Your MRI did not show any evidence of stroke.

## 2022-07-23 ENCOUNTER — Ambulatory Visit: Payer: Medicare Other | Admitting: Family Medicine

## 2022-07-30 ENCOUNTER — Ambulatory Visit: Payer: Medicare Other | Admitting: Family Medicine

## 2022-07-31 ENCOUNTER — Ambulatory Visit (INDEPENDENT_AMBULATORY_CARE_PROVIDER_SITE_OTHER): Payer: Medicare Other | Admitting: Family Medicine

## 2022-07-31 ENCOUNTER — Encounter: Payer: Self-pay | Admitting: Family Medicine

## 2022-07-31 VITALS — BP 120/60 | HR 72 | Ht 63.0 in | Wt 172.0 lb

## 2022-07-31 DIAGNOSIS — R4702 Dysphasia: Secondary | ICD-10-CM

## 2022-07-31 DIAGNOSIS — Z23 Encounter for immunization: Secondary | ICD-10-CM

## 2022-07-31 NOTE — Progress Notes (Signed)
Date:  07/31/2022   Name:  Regina Macdonald   DOB:  [birth year]   MRN:  [id number]   Chief Complaint: Follow-up (Thinks that Tramadol could have been the cause of speech issues. Ruled out stroke or mini stroke. Speech is clear and good today) and Flu Vaccine  Patient is a 81 year old female who presents for a follow up er exam. The patient reports the following problems: speech concern/. Health maintenance has been reviewed up to date.      Lab Results  Component Value Date   NA 141 07/21/2022   K 3.7 07/21/2022   CO2 24 07/21/2022   GLUCOSE 102 (H) 07/21/2022   BUN 15 07/21/2022   CREATININE 0.82 07/21/2022   CALCIUM 9.4 07/21/2022   EGFR 79 03/31/2022   GFRNONAA >60 07/21/2022   Lab Results  Component Value Date   CHOL 138 09/05/2021   HDL 38 (L) 09/05/2021   LDLCALC 87 09/05/2021   TRIG 65 09/05/2021   CHOLHDL 3.0 08/19/2017   Lab Results  Component Value Date   TSH 3.16 01/24/2021   Lab Results  Component Value Date   HGBA1C 5.7 (H) 12/02/2015   Lab Results  Component Value Date   WBC 8.8 07/21/2022   HGB 11.9 (L) 07/21/2022   HCT 36.5 07/21/2022   MCV 93.4 07/21/2022   PLT 333 07/21/2022   Lab Results  Component Value Date   ALT 16 07/21/2022   AST 28 07/21/2022   ALKPHOS 242 (H) 07/21/2022   BILITOT 0.8 07/21/2022   No results found for: "25OHVITD2", "25OHVITD3", "VD25OH"   Review of Systems  Constitutional:  Negative for chills and fever.  HENT:  Negative for drooling, ear discharge, ear pain and sore throat.   Respiratory:  Negative for cough, shortness of breath and wheezing.   Cardiovascular:  Negative for chest pain, palpitations and leg swelling.  Gastrointestinal:  Negative for abdominal pain, blood in stool, constipation, diarrhea and nausea.  Endocrine: Negative for polydipsia.  Genitourinary:  Negative for dysuria, frequency, hematuria and urgency.  Musculoskeletal:  Negative for back pain, myalgias and neck pain.  Skin:  Negative for  rash.  Allergic/Immunologic: Negative for environmental allergies.  Neurological:  Negative for dizziness and headaches.  Hematological:  Does not bruise/bleed easily.  Psychiatric/Behavioral:  Negative for suicidal ideas. The patient is not nervous/anxious.     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   Anxiety 03/23/2018   Taking medication for chronic disease 03/23/2018   Other constipation 12/13/2017   Depression with anxiety 08/19/2017   Hyperlipidemia 08/19/2017   Gastroesophageal reflux disease without esophagitis 08/19/2017   Other chest pain 04/16/2015   COPD exacerbation ([place]) 04/16/2015   Sleep apnea 04/16/2015   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   Asthmatic bronchitis 04/16/2015   Abdominal pain, LLQ 03/29/2014   Rectal bleeding 03/29/2014   Arthritis, 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   Osteoarthrosis, unspecified whether generalized or localized, pelvic region and thigh 03/26/2014    Allergies  Allergen Reactions   Ciprofloxacin Other (See Comments)   Etodolac Other (See Comments)   Tramadol Other (See Comments)    Slurred speech/ confusion   Sulfa Antibiotics Rash    Past Surgical History:  Procedure Laterality Date   ABDOMINAL HYSTERECTOMY  COLONOSCOPY  06/08/2013   Dr Oh- small mouth diverticula   HIP SURGERY Left    KNEE SURGERY Bilateral    NASAL RECONSTRUCTION     x 2    Social History   Tobacco Use   Smoking status: Former   Smokeless tobacco: Never  Vaping Use   Vaping Use: Never used  Substance Use Topics   Alcohol use: Not Currently   Drug use: Never     Medication list has been reviewed and updated.  Current Meds  Medication Sig   acetaminophen (TYLENOL) 500 MG tablet Take 500 mg by mouth every 6  (six) hours as needed.   aspirin 81 MG chewable tablet Chew 81 mg by mouth daily.   CALCIUM & MAGNESIUM CARBONATES PO Take 1 tablet by mouth daily.   EQ LORATADINE 10 MG tablet Take 1 tablet by mouth once daily   EQUATE STOOL SOFTENER 100 MG capsule Take 1 capsule by mouth twice daily   FLUoxetine (PROZAC) 10 MG capsule Take 1 capsule (10 mg total) by mouth daily.   FLUoxetine (PROZAC) 20 MG capsule Take 1 capsule (20 mg total) by mouth daily.   fluticasone (FLONASE) 50 MCG/ACT nasal spray Use 2 sprays in each nostril daily   fluticasone furoate-vilanterol (BREO ELLIPTA) 100-25 MCG/ACT AEPB Inhale 1 puff into the lungs daily.   gemfibrozil (LOPID) 600 MG tablet Take 1 tablet (600 mg total) by mouth 2 (two) times daily.   ibuprofen (ADVIL) 200 MG tablet Take 400 mg by mouth every 6 (six) hours as needed for moderate pain.   Multiple Vitamins-Minerals (CENTRUM SILVER PO) Take 1 capsule by mouth daily.   pantoprazole (PROTONIX) 40 MG tablet Take 1 tablet (40 mg total) by mouth daily.   polyethylene glycol (MIRALAX) packet Take 17 g by mouth daily.       07/31/2022   11:10 AM 07/02/2022    3:41 PM 03/31/2022   10:57 AM 01/02/2022    1:35 PM  GAD 7 : Generalized Anxiety Score  Nervous, Anxious, on Edge 0 2 0 0  Control/stop worrying 0 3 0 0  Worry too much - different things 0 3 0 0  Trouble relaxing 0 3 0 0  Restless 0 3 0 0  Easily annoyed or irritable 0 3 0 0  Afraid - awful might happen 0 3 0 0  Total GAD 7 Score 0 20 0 0  Anxiety Difficulty Not difficult at all Not difficult at all Not difficult at all Not difficult at all       07/31/2022   11:10 AM 07/02/2022    3:40 PM 03/31/2022   10:57 AM  Depression screen PHQ 2/9  Decreased Interest 0 3 0  Down, Depressed, Hopeless 0 3 0  PHQ - 2 Score 0 6 0  Altered sleeping 0 3 0  Tired, decreased energy 0 3 0  Change in appetite 0 3 0  Feeling bad or failure about yourself  0 3 0  Trouble concentrating 0 3 0  Moving slowly or  fidgety/restless 0 3 0  Suicidal thoughts 0 0 0  PHQ-9 Score 0 24 0  Difficult doing work/chores Not difficult at all Extremely dIfficult Not difficult at all    BP Readings from Last 3 Encounters:  07/31/22 120/60  07/21/22 (!) 155/61  07/02/22 120/64    Physical Exam HENT:     Head: Normocephalic.     Right Ear: Tympanic membrane normal.     Nose: Nose normal.    Eyes:     Pupils: Pupils are equal, round, and reactive to light.  Cardiovascular:     Rate and Rhythm: Normal rate.     Heart sounds: Normal heart sounds. No murmur heard.    No gallop.  Pulmonary:     Breath sounds: No wheezing or rhonchi.  Musculoskeletal:     Cervical back: Neck supple.  Neurological:     Cranial Nerves: Cranial nerves 2-12 are intact.     Wt Readings from Last 3 Encounters:  07/31/22 172 lb (78 kg)  07/02/22 175 lb (79.4 kg)  06/29/22 175 lb (79.4 kg)    BP 120/60   Pulse 72   Ht 5' 3" (1.6 m)   Wt 172 lb (78 kg)   BMI 30.47 kg/m   Assessment and Plan:  1. Expressive dysphasia New onset.  Improved.  Resolved.  Since discontinuance of the tramadol patient is back to her baseline without difficulty of slurred speech or difficulty and finding words.  Patient was have reactivity opiate like analgesics which will be avoided in the future.  2. Need for immunization against influenza Discussed and administered - Flu Vaccine QUAD High Dose(Fluad)    Deanna Jones, MD  

## 2022-08-25 DIAGNOSIS — G4733 Obstructive sleep apnea (adult) (pediatric): Secondary | ICD-10-CM | POA: Diagnosis not present

## 2022-08-26 ENCOUNTER — Other Ambulatory Visit: Payer: Self-pay

## 2022-08-26 DIAGNOSIS — R0602 Shortness of breath: Secondary | ICD-10-CM

## 2022-09-02 ENCOUNTER — Ambulatory Visit: Payer: Medicare Other | Admitting: Internal Medicine

## 2022-09-11 DIAGNOSIS — G4733 Obstructive sleep apnea (adult) (pediatric): Secondary | ICD-10-CM | POA: Diagnosis not present

## 2022-09-16 ENCOUNTER — Ambulatory Visit: Payer: Medicare Other | Admitting: Internal Medicine

## 2022-09-16 DIAGNOSIS — R0602 Shortness of breath: Secondary | ICD-10-CM

## 2022-09-18 NOTE — Procedures (Signed)
[facility] [address] [place], [id number]    Complete Pulmonary Function Testing Interpretation:  FINDINGS:  Forced vital capacity is moderately decreased FEV1 is 1.16 L which is 65% of predicted and is mildly decreased.  FEV1 FVC ratio is normal.  Postbronchodilator no significant change in FEV1 however clinical improvement may occur in the absence of spirometric improvement.  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

## 2022-09-22 LAB — PULMONARY FUNCTION TEST

## 2022-09-28 ENCOUNTER — Ambulatory Visit: Payer: Medicare Other | Admitting: Internal Medicine

## 2022-09-28 ENCOUNTER — Encounter: Payer: Self-pay | Admitting: Internal Medicine

## 2022-09-28 VITALS — BP 151/60 | HR 79 | Temp 98.3°F | Resp 16 | Ht 63.0 in | Wt 178.2 lb

## 2022-09-28 DIAGNOSIS — J986 Disorders of diaphragm: Secondary | ICD-10-CM | POA: Diagnosis not present

## 2022-09-28 DIAGNOSIS — G4733 Obstructive sleep apnea (adult) (pediatric): Secondary | ICD-10-CM

## 2022-09-28 DIAGNOSIS — J4489 Other specified chronic obstructive pulmonary disease: Secondary | ICD-10-CM

## 2022-09-28 NOTE — Progress Notes (Signed)
[facility] [place], [place] [address]  Pulmonary Sleep Medicine   Office Visit Note  Patient Name: Regina Macdonald DOB: Sep 10, 1941 MRN [id number]  Date of Service: 09/28/2022  Complaints/HPI: She is doing well overall. She had breathing tests done and these were reviewed which was lower than expected however she also was not feling good when the test was done. Now she feels better. She has slight cough which is unchanged. She is using her CPAP nightly. She has no issues with the machine. No chest no fevers or chills. She needs her shingles booster at her pharmacy  ROS  General: (-) fever, (-) chills, (-) night sweats, (-) weakness Skin: (-) rashes, (-) itching,. Eyes: (-) visual changes, (-) redness, (-) itching. Nose and Sinuses: (-) nasal stuffiness or itchiness, (-) postnasal drip, (-) nosebleeds, (-) sinus trouble. Mouth and Throat: (-) sore throat, (-) hoarseness. Neck: (-) swollen glands, (-) enlarged thyroid, (-) neck pain. Respiratory: - cough, (-) bloody sputum, - shortness of breath, - wheezing. Cardiovascular: - ankle swelling, (-) chest pain. Lymphatic: (-) lymph node enlargement. Neurologic: (-) numbness, (-) tingling. Psychiatric: (-) anxiety, (-) depression   Current Medication: Outpatient Encounter Medications as of 09/28/2022  Medication Sig   acetaminophen (TYLENOL) 500 MG tablet Take 500 mg by mouth every 6 (six) hours as needed.   aspirin 81 MG chewable tablet Chew 81 mg by mouth daily.   CALCIUM & MAGNESIUM CARBONATES PO Take 1 tablet by mouth daily.   EQ LORATADINE 10 MG tablet Take 1 tablet by mouth once daily   EQUATE STOOL SOFTENER 100 MG capsule Take 1 capsule by mouth twice daily   FLUoxetine (PROZAC) 10 MG capsule Take 1 capsule (10 mg total) by mouth daily.   FLUoxetine (PROZAC) 20 MG capsule Take 1 capsule (20 mg total) by mouth daily.   fluticasone (FLONASE) 50 MCG/ACT nasal spray Use 2 sprays in each nostril daily    fluticasone furoate-vilanterol (BREO ELLIPTA) 100-25 MCG/ACT AEPB Inhale 1 puff into the lungs daily.   gemfibrozil (LOPID) 600 MG tablet Take 1 tablet (600 mg total) by mouth 2 (two) times daily.   ibuprofen (ADVIL) 200 MG tablet Take 400 mg by mouth every 6 (six) hours as needed for moderate pain.   Multiple Vitamins-Minerals (CENTRUM SILVER PO) Take 1 capsule by mouth daily.   pantoprazole (PROTONIX) 40 MG tablet Take 1 tablet (40 mg total) by mouth daily.   polyethylene glycol (MIRALAX) packet Take 17 g by mouth daily.   No facility-administered encounter medications on file as of 09/28/2022.    Surgical History: Past Surgical History:  Procedure Laterality Date   ABDOMINAL HYSTERECTOMY     COLONOSCOPY  06/08/2013   Dr Candace Cruise- small mouth diverticula   HIP SURGERY Left    KNEE SURGERY Bilateral    NASAL RECONSTRUCTION     x 2    Medical History: Past Medical History:  Diagnosis Date   Anxiety    Asthma    Cancer ([place]) cervical-1973   COPD (chronic obstructive pulmonary disease) (HCC)    Depression    Diverticula, colon    Diverticulosis    GERD (gastroesophageal reflux disease)    Sleep apnea     Family History: Family History  Problem Relation Age of Onset   Cirrhosis Father    Coronary artery disease Mother    Breast cancer Neg Hx     Social History: Social History   Socioeconomic History   Marital status: Divorced  Spouse name: Not on file   Number of children: 3   Years of education: 4   Highest education level: High school graduate  Occupational History   Occupation: retired  Tobacco Use   Smoking status: Former   Smokeless tobacco: Never  Scientific laboratory technician Use: Never used  Substance and Sexual Activity   Alcohol use: Not Currently   Drug use: Never   Sexual activity: Not Currently    Partners: Male    Birth control/protection: Post-menopausal  Other Topics Concern   Not on file  Social History Narrative   Pt lives with her daughter    Social Determinants of Health   Financial Resource Strain: [place]  (10/22/2021)   Overall Financial Resource Strain (CARDIA)    Difficulty of Paying Living Expenses: Not hard at all  Food Insecurity: No Food Insecurity (10/22/2021)   Hunger Vital Sign    Worried About Running Out of Food in the Last Year: Never true    [place] in the Last Year: Never true  Transportation Needs: No Transportation Needs (10/22/2021)   PRAPARE - Hydrologist (Medical): No    Lack of Transportation (Non-Medical): No  Physical Activity: Inactive (10/22/2021)   Exercise Vital Sign    Days of Exercise per Week: 0 days    Minutes of Exercise per Session: 0 min  Stress: No Stress Concern Present (10/22/2021)   [place]    Feeling of Stress : Not at all  Social Connections: Socially Isolated (10/22/2021)   Social Connection and Isolation Panel [NHANES]    Frequency of Communication with Friends and Family: More than three times a week    Frequency of Social Gatherings with Friends and Family: More than three times a week    Attends Religious Services: Never    Marine scientist or Organizations: No    Attends Archivist Meetings: Never    Marital Status: Divorced  Human resources officer Violence: Not At Risk (10/22/2021)   Humiliation, Afraid, Rape, and Kick questionnaire    Fear of Current or Ex-Partner: No    Emotionally Abused: No    Physically Abused: No    Sexually Abused: No    Vital Signs: Blood pressure (!) 151/60, pulse 79, temperature 98.3 F (36.8 C), resp. rate 16, height '5\' 3"'$  (1.6 m), weight 178 lb 3.2 oz (80.8 kg), SpO2 97 %.  Examination: General Appearance: The patient is well-developed, well-nourished, and in no distress. Skin: Gross inspection of skin unremarkable. Head: normocephalic, no gross deformities. Eyes: no gross deformities noted. ENT: ears appear grossly  normal no exudates. Neck: Supple. No thyromegaly. No LAD. Respiratory: no rhochi noted. Cardiovascular: Normal S1 and S2 without murmur or rub. Extremities: No cyanosis. pulses are equal. Neurologic: Alert and oriented. No involuntary movements.  LABS: Recent Results (from the past 2160 hour(s))  Protime-INR     Status: None   Collection Time: 07/21/22 12:13 PM  Result Value Ref Range   Prothrombin Time 14.1 11.4 - 15.2 seconds   INR 1.1 0.8 - 1.2    Comment: (NOTE) INR goal varies based on device and disease states. Performed at [facility], [address] [place], [place] [address]   APTT     Status: None   Collection Time: 07/21/22 12:13 PM  Result Value Ref Range   aPTT 32 24 - 36 seconds    Comment: Performed at [facility], [address] [place], [place] [address]  CBC     Status: Abnormal   Collection Time: 07/21/22 12:13 PM  Result Value Ref Range   WBC 8.8 4.0 - 10.5 K/uL   RBC 3.91 3.87 - 5.11 MIL/uL   Hemoglobin 11.9 (L) 12.0 - 15.0 g/dL   HCT 36.5 36.0 - 46.0 %   MCV 93.4 80.0 - 100.0 fL   MCH 30.4 26.0 - 34.0 pg   MCHC 32.6 30.0 - 36.0 g/dL   RDW 12.7 11.5 - 15.5 %   Platelets 333 150 - 400 K/uL   nRBC 0.0 0.0 - 0.2 %    Comment: Performed at [facility], [address] [place], [place] [address]  Differential     Status: None   Collection Time: 07/21/22 12:13 PM  Result Value Ref Range   Neutrophils Relative % 71 %   Neutro Abs 6.2 1.7 - 7.7 K/uL   Lymphocytes Relative 20 %   Lymphs Abs 1.8 0.7 - 4.0 K/uL   Monocytes Relative 6 %   Monocytes Absolute 0.5 0.1 - 1.0 K/uL   Eosinophils Relative 2 %   Eosinophils Absolute 0.2 0.0 - 0.5 K/uL   Basophils Relative 1 %   Basophils Absolute 0.1 0.0 - 0.1 K/uL   Immature Granulocytes 0 %   Abs Immature Granulocytes 0.03 0.00 - 0.07 K/uL    Comment: Performed at [facility], [address] [place], [place] [address]  Comprehensive metabolic panel     Status:  Abnormal   Collection Time: 07/21/22 12:13 PM  Result Value Ref Range   Sodium 141 135 - 145 mmol/L   Potassium 3.7 3.5 - 5.1 mmol/L   Chloride 105 98 - 111 mmol/L   CO2 24 22 - 32 mmol/L   Glucose, Bld 102 (H) 70 - 99 mg/dL    Comment: Glucose reference range applies only to samples taken after fasting for at least 8 hours.   BUN 15 8 - 23 mg/dL   Creatinine, Ser 0.82 0.44 - 1.00 mg/dL   Calcium 9.4 8.9 - 10.3 mg/dL   Total Protein 7.9 6.5 - 8.1 g/dL   Albumin 4.4 3.5 - 5.0 g/dL   AST 28 15 - 41 U/L   ALT 16 0 - 44 U/L   Alkaline Phosphatase 242 (H) 38 - 126 U/L   Total Bilirubin 0.8 0.3 - 1.2 mg/dL   GFR, Estimated >60 >60 mL/min    Comment: (NOTE) Calculated using the CKD-EPI Creatinine Equation (2021)    Anion gap 12 5 - 15    Comment: Performed at [facility], [address] [place], [place] [address]  Ethanol     Status: None   Collection Time: 07/21/22 12:13 PM  Result Value Ref Range   Alcohol, Ethyl (B) <10 <10 mg/dL    Comment: (NOTE) Lowest detectable limit for serum alcohol is 10 mg/dL.  For medical purposes only. Performed at [facility], [address] [place], [place] [address]   Pulmonary Function Test     Status: None   Collection Time: 09/22/22 10:21 AM  Result Value Ref Range   FEV1     FVC     FEV1/FVC     TLC     DLCO      Radiology: MR BRAIN WO CONTRAST  Result Date: 07/21/2022 CLINICAL DATA:  Slurred speech, recent fall EXAM: MRI HEAD WITHOUT CONTRAST TECHNIQUE: Multiplanar, multiecho pulse sequences of the brain and surrounding  structures were obtained without intravenous contrast. COMPARISON:  12/02/2004 FINDINGS: Brain: No restricted diffusion to suggest acute or subacute infarct. No acute hemorrhage, mass, mass effect, or midline shift. No hydrocephalus or extra-axial collection. No hemosiderin deposition to suggest remote hemorrhage. T2 hyperintense signal in the periventricular white matter, likely the sequela of  moderate chronic small vessel ischemic disease. Vascular: Normal arterial flow voids. Skull and upper cervical spine: Normal marrow signal. Sinuses/Orbits: Mild mucosal thickening in the right maxillary sinus and left ethmoid air cells. The orbits are unremarkable. Other: The mastoids are well aerated. IMPRESSION: No acute intracranial process. No evidence of acute or subacute infarct. Electronically Signed   By: Merilyn Baba M.D.   On: 07/21/2022 20:56   CT HEAD WO CONTRAST  Result Date: 07/21/2022 CLINICAL DATA:  81 year old female for evaluation of slurred speech 1 week ago. EXAM: CT HEAD WITHOUT CONTRAST TECHNIQUE: Contiguous axial images were obtained from the base of the skull through the vertex without intravenous contrast. RADIATION DOSE REDUCTION: This exam was performed according to the departmental dose-optimization program which includes automated exposure control, adjustment of the mA and/or kV according to patient size and/or use of iterative reconstruction technique. COMPARISON:  Prior MRI of the brain from January of 2006. FINDINGS: Brain: No evidence of acute infarction, hemorrhage, hydrocephalus, extra-axial collection or mass lesion/mass effect. Signs of atrophy and of chronic microvascular ischemic change that is worsened since previous imaging. Vascular: No hyperdense vessel or unexpected calcification. Skull: Normal. Negative for fracture or focal lesion. Sinuses/Orbits: Visualized paranasal sinuses and orbits are unremarkable to the extent evaluated aside from changes of chronic sinus disease with thickening of RIGHT maxillary sinus. No air-fluid levels. Mastoid air cells are clear. Other: None IMPRESSION: 1. No acute intracranial abnormality. 2. Signs of atrophy and chronic microvascular ischemic change that has worsened since previous imaging. 3. Sequela of chronic maxillary sinus disease with thickening of the maxillary sinus partially visualized. Also suspect maxillary antrostomy. No  acute findings or air-fluid levels involving the sinuses to the extent evaluated. Electronically Signed   By: Zetta Bills M.D.   On: 07/21/2022 13:15    No results found.  No results found.    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   Anxiety 03/23/2018   Taking medication for chronic disease 03/23/2018   Other constipation 12/13/2017   Depression with anxiety 08/19/2017   Hyperlipidemia 08/19/2017   Gastroesophageal reflux disease without esophagitis 08/19/2017   Other chest pain 04/16/2015   COPD exacerbation ([place]) 04/16/2015   Sleep apnea 04/16/2015   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   Asthmatic bronchitis 04/16/2015   Abdominal pain, LLQ 03/29/2014   Rectal bleeding 03/29/2014   Arthritis, 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   Osteoarthrosis, unspecified whether generalized or localized, pelvic region and thigh 03/26/2014    1. Obstructive chronic bronchitis without exacerbation On appropriate therapy. She is well controlled and will be on her inhalers as ordered  2. OSA on CPAP On CPAP therapy which will be continued  3. Diaphragm dysfunction Supportive care  4. Morbid obesity ([place]) Obesity Counseling: Had a lengthy discussion regarding patients BMI and weight issues. Patient was instructed on portion control as well as increased activity. Also discussed caloric restrictions with trying to maintain intake less than 2000 Kcal. Discussions were  made in accordance with the 5As of weight management. Simple actions such as not eating late and if able to, taking a walk is suggested.    General Counseling: I have discussed the findings of the evaluation and examination with [place].  I have also discussed any further  diagnostic evaluation thatmay be needed or ordered today. Veola verbalizes understanding of the findings of todays visit. We also reviewed her medications today and discussed drug interactions and side effects including but not limited excessive drowsiness and altered mental states. We also discussed that there is always a risk not just to her but also people around her. she has been encouraged to call the office with any questions or concerns that should arise related to todays visit.  No orders of the defined types were placed in this encounter.    Time spent: 34  I have personally obtained a history, examined the patient, evaluated laboratory and imaging results, formulated the assessment and plan and placed orders.    [name], MD [facility] Pulmonary and Critical Care Sleep medicine

## 2022-10-01 ENCOUNTER — Ambulatory Visit (INDEPENDENT_AMBULATORY_CARE_PROVIDER_SITE_OTHER): Payer: Medicare Other | Admitting: Family Medicine

## 2022-10-01 ENCOUNTER — Encounter: Payer: Self-pay | Admitting: Family Medicine

## 2022-10-01 VITALS — BP 128/78 | HR 69 | Ht 63.0 in | Wt 178.0 lb

## 2022-10-01 DIAGNOSIS — K219 Gastro-esophageal reflux disease without esophagitis: Secondary | ICD-10-CM

## 2022-10-01 DIAGNOSIS — J309 Allergic rhinitis, unspecified: Secondary | ICD-10-CM

## 2022-10-01 DIAGNOSIS — F419 Anxiety disorder, unspecified: Secondary | ICD-10-CM | POA: Diagnosis not present

## 2022-10-01 DIAGNOSIS — F320 Major depressive disorder, single episode, mild: Secondary | ICD-10-CM | POA: Diagnosis not present

## 2022-10-01 DIAGNOSIS — Z23 Encounter for immunization: Secondary | ICD-10-CM

## 2022-10-01 DIAGNOSIS — E785 Hyperlipidemia, unspecified: Secondary | ICD-10-CM | POA: Diagnosis not present

## 2022-10-01 MED ORDER — EQ LORATADINE 10 MG PO TABS: 10.0000 mg | ORAL_TABLET | Freq: Every day | ORAL | 1 refills | Status: DC

## 2022-10-01 MED ORDER — PANTOPRAZOLE SODIUM 40 MG PO TBEC: 40.0000 mg | DELAYED_RELEASE_TABLET | Freq: Every day | ORAL | 5 refills | Status: DC

## 2022-10-01 MED ORDER — FLUOXETINE HCL 20 MG PO CAPS: 20.0000 mg | ORAL_CAPSULE | Freq: Every day | ORAL | 5 refills | Status: DC

## 2022-10-01 MED ORDER — FLUOXETINE HCL 10 MG PO CAPS: 10.0000 mg | ORAL_CAPSULE | Freq: Every day | ORAL | 5 refills | Status: DC

## 2022-10-01 MED ORDER — GEMFIBROZIL 600 MG PO TABS: 600.0000 mg | ORAL_TABLET | Freq: Two times a day (BID) | ORAL | 5 refills | Status: DC

## 2022-10-01 MED ORDER — FLUTICASONE PROPIONATE 50 MCG/ACT NA SUSP: NASAL | 1 refills | Status: DC

## 2022-10-01 NOTE — Progress Notes (Signed)
Date:  10/01/2022   Name:  Regina Macdonald   DOB:  [birth year]   MRN:  [id number]   Chief Complaint: Allergic Rhinitis , Depression, Hyperlipidemia, Gastroesophageal Reflux, and shingles vaccine   Depression        This is a chronic problem.  The current episode started more than 1 year ago.   The onset quality is gradual.   The problem occurs intermittently.  The problem has been gradually improving since onset.  Associated symptoms include no decreased concentration, no fatigue, no helplessness, no hopelessness, does not have insomnia, not irritable, no restlessness, no decreased interest, no appetite change, no body aches, no myalgias, no headaches, no indigestion, not sad and no suicidal ideas.     The symptoms are aggravated by nothing.  Past treatments include SSRIs - Selective serotonin reuptake inhibitors.  Compliance with treatment is variable.  Previous treatment provided moderate relief.   Pertinent negatives include no hypothyroidism. Hyperlipidemia This is a chronic problem. The current episode started more than 1 year ago. The problem is controlled. Recent lipid tests were reviewed and are normal. She has no history of chronic renal disease, diabetes, hypothyroidism, liver disease, obesity or nephrotic syndrome. There are no known factors aggravating her hyperlipidemia. Pertinent negatives include no chest pain, focal sensory loss, focal weakness, leg pain, myalgias or shortness of breath. The current treatment provides moderate improvement of lipids.  Gastroesophageal Reflux She reports no abdominal pain, no belching, no chest pain, no coughing, no dysphagia, no heartburn, no nausea, no sore throat or no wheezing. This is a chronic problem. The problem has been gradually improving. The symptoms are aggravated by certain foods. Pertinent negatives include no fatigue. She has tried a PPI for the symptoms. The treatment provided moderate relief.    Lab Results  Component Value Date    NA 141 07/21/2022   K 3.7 07/21/2022   CO2 24 07/21/2022   GLUCOSE 102 (H) 07/21/2022   BUN 15 07/21/2022   CREATININE 0.82 07/21/2022   CALCIUM 9.4 07/21/2022   EGFR 79 03/31/2022   GFRNONAA >60 07/21/2022   Lab Results  Component Value Date   CHOL 138 09/05/2021   HDL 38 (L) 09/05/2021   LDLCALC 87 09/05/2021   TRIG 65 09/05/2021   CHOLHDL 3.0 08/19/2017   Lab Results  Component Value Date   TSH 3.16 01/24/2021   Lab Results  Component Value Date   HGBA1C 5.7 (H) 12/02/2015   Lab Results  Component Value Date   WBC 8.8 07/21/2022   HGB 11.9 (L) 07/21/2022   HCT 36.5 07/21/2022   MCV 93.4 07/21/2022   PLT 333 07/21/2022   Lab Results  Component Value Date   ALT 16 07/21/2022   AST 28 07/21/2022   ALKPHOS 242 (H) 07/21/2022   BILITOT 0.8 07/21/2022   No results found for: "25OHVITD2", "25OHVITD3", "VD25OH"   Review of Systems  Constitutional: Negative.  Negative for appetite change, chills, fatigue, fever and unexpected weight change.  HENT:  Negative for congestion, ear discharge, ear pain, rhinorrhea, sinus pressure, sneezing and sore throat.   Respiratory:  Negative for cough, shortness of breath, wheezing and stridor.   Cardiovascular:  Negative for chest pain.  Gastrointestinal:  Negative for abdominal pain, blood in stool, constipation, diarrhea, dysphagia, heartburn and nausea.  Genitourinary:  Negative for dysuria, flank pain, frequency, hematuria, urgency and vaginal discharge.  Musculoskeletal:  Negative for arthralgias, back pain and myalgias.  Skin:  Negative for rash.  Neurological:  Negative for dizziness, focal weakness, weakness and headaches.  Hematological:  Negative for adenopathy. Does not bruise/bleed easily.  Psychiatric/Behavioral:  Positive for depression. Negative for decreased concentration, dysphoric mood and suicidal ideas. The patient is not nervous/anxious and does not have insomnia.     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   Anxiety 03/23/2018   Taking medication for chronic disease 03/23/2018   Other constipation 12/13/2017   Depression with anxiety 08/19/2017   Hyperlipidemia 08/19/2017   Gastroesophageal reflux disease without esophagitis 08/19/2017   Other chest pain 04/16/2015   COPD exacerbation ([place]) 04/16/2015   Sleep apnea 04/16/2015   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   Asthmatic bronchitis 04/16/2015   Abdominal pain, LLQ 03/29/2014   Rectal bleeding 03/29/2014   Arthritis, 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   Osteoarthrosis, unspecified whether generalized or localized, pelvic region and thigh 03/26/2014    Allergies  Allergen Reactions   Ciprofloxacin Other (See Comments)   Etodolac Other (See Comments)   Tramadol Other (See Comments)    Slurred speech/ confusion   Sulfa Antibiotics Rash    Past Surgical History:  Procedure Laterality Date   ABDOMINAL HYSTERECTOMY     COLONOSCOPY  06/08/2013   Dr Candace Cruise- small mouth diverticula   HIP SURGERY Left    KNEE SURGERY Bilateral    NASAL RECONSTRUCTION     x 2    Social History   Tobacco Use   Smoking status: Former   Smokeless tobacco: Never  Vaping Use   Vaping Use: Never used  Substance Use Topics   Alcohol use: Not Currently   Drug use: Never     Medication list has been reviewed and updated.  Current Meds  Medication Sig   acetaminophen (TYLENOL) 500 MG tablet Take 500 mg by mouth every 6 (six) hours as needed.   aspirin 81 MG chewable tablet Chew 81 mg by mouth daily.   CALCIUM & MAGNESIUM CARBONATES PO Take 1 tablet by mouth daily.   EQ LORATADINE 10 MG tablet Take 1 tablet by mouth once daily   EQUATE STOOL SOFTENER 100 MG capsule Take 1 capsule by mouth twice daily   FLUoxetine (PROZAC)  10 MG capsule Take 1 capsule (10 mg total) by mouth daily.   FLUoxetine (PROZAC) 20 MG capsule Take 1 capsule (20 mg total) by mouth daily.   fluticasone (FLONASE) 50 MCG/ACT nasal spray Use 2 sprays in each nostril daily   fluticasone furoate-vilanterol (BREO ELLIPTA) 100-25 MCG/ACT AEPB Inhale 1 puff into the lungs daily.   gemfibrozil (LOPID) 600 MG tablet Take 1 tablet (600 mg total) by mouth 2 (two) times daily.   ibuprofen (ADVIL) 200 MG tablet Take 400 mg by mouth every 6 (six) hours as needed for moderate pain.   Multiple Vitamins-Minerals (CENTRUM SILVER PO) Take 1 capsule by mouth daily.   pantoprazole (PROTONIX) 40 MG tablet Take 1 tablet (40 mg total) by mouth daily.   polyethylene glycol (MIRALAX) packet Take 17 g by mouth daily.       10/01/2022   10:55 AM 07/31/2022   11:10 AM 07/02/2022    3:41 PM 03/31/2022   10:57 AM  GAD 7 : Generalized Anxiety Score  Nervous, Anxious, on Edge 0 0 2 0  Control/stop worrying 0 0 3 0  Worry too much - different things 0 0 3 0  Trouble relaxing 0 0 3 0  Restless 0 0 3 0  Easily annoyed or irritable 0 0 3 0  Afraid - awful might happen 0 0 3 0  Total GAD 7 Score 0 0 20 0  Anxiety Difficulty Not difficult at all Not difficult at all Not difficult at all Not difficult at all       10/01/2022   10:55 AM 07/31/2022   11:10 AM 07/02/2022    3:40 PM  Depression screen PHQ 2/9  Decreased Interest 0 0 3  Down, Depressed, Hopeless 0 0 3  PHQ - 2 Score 0 0 6  Altered sleeping 0 0 3  Tired, decreased energy 0 0 3  Change in appetite 0 0 3  Feeling bad or failure about yourself  0 0 3  Trouble concentrating 0 0 3  Moving slowly or fidgety/restless 0 0 3  Suicidal thoughts 0 0 0  PHQ-9 Score 0 0 24  Difficult doing work/chores Not difficult at all Not difficult at all Extremely dIfficult    BP Readings from Last 3 Encounters:  10/01/22 128/78  09/28/22 (!) 151/60  07/31/22 120/60    Physical Exam Vitals and nursing note reviewed.  Exam conducted with a chaperone present.  Constitutional:      General: She is not irritable.She is not in acute distress.    Appearance: She is not diaphoretic.  HENT:     Head: Normocephalic and atraumatic.     Right Ear: Tympanic membrane and external ear normal.     Left Ear: Tympanic membrane and external ear normal.     Nose: Nose normal.     Mouth/Throat:     Mouth: Mucous membranes are moist.  Eyes:     General:        Right eye: No discharge.        Left eye: No discharge.     Conjunctiva/sclera: Conjunctivae normal.     Pupils: Pupils are equal, round, and reactive to light.  Neck:     Thyroid: No thyromegaly.     Vascular: No JVD.  Cardiovascular:     Rate and Rhythm: Normal rate and regular rhythm.     Heart sounds: Normal heart sounds, S1 normal and S2 normal. No murmur heard.    No systolic murmur is present.     No diastolic murmur is present.     No friction rub. No gallop. No S3 or S4 sounds.  Pulmonary:     Effort: Pulmonary effort is normal.     Breath sounds: Normal breath sounds. No wheezing, rhonchi or rales.  Abdominal:     General: Bowel sounds are normal.     Palpations: Abdomen is soft. There is no mass.     Tenderness: There is no abdominal tenderness. There is no guarding.  Musculoskeletal:        General: Normal range of motion.     Cervical back: Normal range of motion and neck supple.  Lymphadenopathy:     Cervical: No cervical adenopathy.  Skin:    General: Skin is warm and dry.  Neurological:     Mental Status: She is alert.     Deep Tendon Reflexes:     Reflex Scores:      Patellar reflexes are 1+ on the left side.    Wt Readings from Last 3 Encounters:  10/01/22 178 lb (80.7 kg)  09/28/22 178 lb 3.2 oz (  80.8 kg)  07/31/22 172 lb (78 kg)    BP 128/78   Pulse 69   Ht _0  (1.6 m)   Wt 178 lb (80.7 kg)   SpO2 97%   BMI 31.53 kg/m   Assessment and Plan:  1. Anxiety Chronic.  Controlled.  Stable.  Continue Prozac 30 mg  once a day.  We will recheck in 6 months.. - FLUoxetine (PROZAC) 10 MG capsule; Take 1 capsule (10 mg total) by mouth daily.  Dispense: 30 capsule; Refill: 5 - FLUoxetine (PROZAC) 20 MG capsule; Take 1 capsule (20 mg total) by mouth daily.  Dispense: 30 capsule; Refill: 5  2. Current mild episode of major depressive disorder, unspecified whether recurrent (HCC) Chronic.  Controlled.  Stable.   score 0.  Continue Prozac 30 mg daily. - FLUoxetine (PROZAC) 10 MG capsule; Take 1 capsule (10 mg total) by mouth daily.  Dispense: 30 capsule; Refill: 5 - FLUoxetine (PROZAC) 20 MG capsule; Take 1 capsule (20 mg total) by mouth daily.  Dispense: 30 capsule; Refill: 5  3. Allergic rhinitis .  Controlled.  Stable.  Refill fluticasone nasal spray 2 sprays each nostril daily. - fluticasone (FLONASE) 50 MCG/ACT nasal spray; Use 2 sprays in each nostril daily  Dispense: 16 g; Refill: 1  4. Hyperlipidemia, unspecified hyperlipidemia type Chronic.  Controlled.  Stable.  Continue gemfibrozil 600 mg 1 twice a day. - gemfibrozil (LOPID) 600 MG tablet; Take 1 tablet (600 mg total) by mouth 2 (two) times daily.  Dispense: 60 tablet; Refill: 5 - Lipid Panel With LDL/HDL Ratio  5. Gastroesophageal reflux disease without esophagitis Chronic.  Controlled.  Stable.  Continue pantoprazole 40 mg once a day. - pantoprazole (PROTONIX) 40 MG tablet; Take 1 tablet (40 mg total) by mouth daily.  Dispense: 30 tablet; Refill: 5  6. Need for shingles vaccine Discussed with patient and patient knows to obtain shingles vaccine at pharmacy/likely [facility].   Otilio Miu, MD

## 2022-10-02 LAB — LIPID PANEL WITH LDL/HDL RATIO
Cholesterol, Total: 131 mg/dL (ref 100–199)
HDL: 36 mg/dL — ABNORMAL LOW (ref >39)
LDL Chol Calc (NIH): 75 mg/dL (ref 0–99)
LDL/HDL Ratio: 2.1 ratio (ref 0.0–3.2)
Triglycerides: 107 mg/dL (ref 0–149)
VLDL Cholesterol Cal: 20 mg/dL (ref 5–40)

## 2022-10-26 ENCOUNTER — Ambulatory Visit: Payer: Medicare Other

## 2022-10-30 ENCOUNTER — Ambulatory Visit (INDEPENDENT_AMBULATORY_CARE_PROVIDER_SITE_OTHER): Payer: Medicare Other

## 2022-10-30 VITALS — BP 118/64 | HR 76 | Temp 98.7°F | Ht 62.8 in | Wt 178.0 lb

## 2022-10-30 DIAGNOSIS — Z0000 Encounter for general adult medical examination without abnormal findings: Secondary | ICD-10-CM | POA: Diagnosis not present

## 2022-10-30 NOTE — Progress Notes (Signed)
[facility]sted Subjective:   Regina Macdonald is a 81 y.o. female who presents for Medicare Annual (Subsequent) preventive examination.  Review of Systems    Per HPI unless specifically indicated below.  Cardiac Risk Factors include: advanced age (>31mn, >>26women);female gender, and hyperlipidemia.          Objective:    Today's Vitals   10/30/22 1111  BP: 118/64  Pulse: 76  Temp: 98.7 F (37.1 C)  TempSrc: Oral  SpO2: 97%  Weight: 178 lb (80.7 kg)  Height: 5' 2.8" (1.595 m)  PainSc: 0-No pain   Body mass index is 31.74 kg/m.     10/30/2022   11:20 AM 07/21/2022   12:04 PM 10/22/2021   11:16 AM 07/29/2021    5:01 PM 10/16/2020   11:43 AM 10/16/2019   11:29 AM 09/28/2018    9:20 AM  Advanced Directives  Does Patient Have a Medical Advance Directive? No No Yes No No No No  Type of AComptrollerLiving will      Copy of HAgencyin Chart?   No - copy requested      Would patient like information on creating a medical advance directive? No - Patient declined   No - Patient declined No - Patient declined No - Patient declined Yes (MAU/Ambulatory/Procedural Areas - Information given)    Current Medications (verified) Outpatient Encounter Medications as of 10/30/2022  Medication Sig   acetaminophen (TYLENOL) 500 MG tablet Take 500 mg by mouth every 6 (six) hours as needed.   aspirin 81 MG chewable tablet Chew 81 mg by mouth daily.   CALCIUM & MAGNESIUM CARBONATES PO Take 1 tablet by mouth daily.   EQ LORATADINE 10 MG tablet Take 1 tablet (10 mg total) by mouth daily.   EQUATE STOOL SOFTENER 100 MG capsule Take 1 capsule by mouth twice daily   FLUoxetine (PROZAC) 10 MG capsule Take 1 capsule (10 mg total) by mouth daily.   FLUoxetine (PROZAC) 20 MG capsule Take 1 capsule (20 mg total) by mouth daily.   fluticasone (FLONASE) 50 MCG/ACT nasal spray Use 2 sprays in each nostril daily   fluticasone furoate-vilanterol (BREO  ELLIPTA) 100-25 MCG/ACT AEPB Inhale 1 puff into the lungs daily.   gemfibrozil (LOPID) 600 MG tablet Take 1 tablet (600 mg total) by mouth 2 (two) times daily.   ibuprofen (ADVIL) 200 MG tablet Take 400 mg by mouth every 6 (six) hours as needed for moderate pain.   Multiple Vitamins-Minerals (CENTRUM SILVER PO) Take 1 capsule by mouth daily.   pantoprazole (PROTONIX) 40 MG tablet Take 1 tablet (40 mg total) by mouth daily.   polyethylene glycol (MIRALAX) packet Take 17 g by mouth daily.   No facility-administered encounter medications on file as of 10/30/2022.    Allergies (verified) Ciprofloxacin, Etodolac, Tramadol, and Sulfa antibiotics   History: Past Medical History:  Diagnosis Date   Anxiety    Asthma    Cancer [place]   COPD (chronic obstructive pulmonary disease) (HCC)    Depression    Diverticula, colon    Diverticulosis    GERD (gastroesophageal reflux disease)    Sleep apnea    Past Surgical History:  Procedure Laterality Date   ABDOMINAL HYSTERECTOMY     COLONOSCOPY  06/08/2013   Dr OCandace Cruise small mouth diverticula   HIP SURGERY Left    KNEE SURGERY Bilateral    NASAL RECONSTRUCTION     x 2  Family History  Problem Relation Age of Onset   Cirrhosis Father    Coronary artery disease Mother    Breast cancer Neg Hx    Social History   Socioeconomic History   Marital status: Divorced    Spouse name: Not on file   Number of children: 3   Years of education: 12   Highest education level: High school graduate  Occupational History   Occupation: retired  Tobacco Use   Smoking status: Former   Smokeless tobacco: Never  Scientific laboratory technician Use: Never used  Substance and Sexual Activity   Alcohol use: Not Currently   Drug use: Never   Sexual activity: Not Currently    Partners: Male    Birth control/protection: Post-menopausal  Other Topics Concern   Not on file  Social History Narrative   Pt lives with her daughter   Social Determinants of  Health   Financial Resource Strain: [place]  (10/30/2022)   Overall Financial Resource Strain (CARDIA)    Difficulty of Paying Living Expenses: Not hard at all  Food Insecurity: No Food Insecurity (10/30/2022)   Hunger Vital Sign    Worried About Running Out of Food in the Last Year: Never true    [place] in the Last Year: Never true  Transportation Needs: No Transportation Needs (10/30/2022)   PRAPARE - Hydrologist (Medical): No    Lack of Transportation (Non-Medical): No  Physical Activity: Insufficiently Active (10/30/2022)   Exercise Vital Sign    Days of Exercise per Week: 7 days    Minutes of Exercise per Session: 10 min  Stress: No Stress Concern Present (10/30/2022)   [place]    Feeling of Stress : Only a little  Social Connections: Socially Isolated (10/30/2022)   Social Connection and Isolation Panel [NHANES]    Frequency of Communication with Friends and Family: More than three times a week    Frequency of Social Gatherings with Friends and Family: Three times a week    Attends Religious Services: Never    Active Member of Clubs or Organizations: No    Attends Archivist Meetings: Never    Marital Status: Divorced    Tobacco Counseling Counseling given: Not Answered   Clinical Intake:  Pre-visit preparation completed: No  Pain : No/denies pain Pain Score: 0-No pain     Nutritional Status: BMI 25 -29 Overweight Nutritional Risks: None Diabetes: No  How often do you need to have someone help you when you read instructions, pamphlets, or other written materials from your doctor or pharmacy?: 1 - Never  Diabetic?No      Information entered by :: Donnie Mesa, CMA   Activities of Daily Living    10/30/2022   11:07 AM 01/02/2022    1:36 PM  In your present state of health, do you have any difficulty performing the following activities:   Hearing? 1 1  Vision? 1 0  Difficulty concentrating or making decisions? 1 0  Walking or climbing stairs? 1 0  Dressing or bathing? 1 0  Doing errands, shopping? 0 0    Patient Care Team: Juline Patch, MD as PCP - General (Family Medicine) [name], MD as Consulting Physician (Internal Medicine)  Indicate any recent Medical Services you may have received from other than [facility] providers in the past year (date may be approximate).    The pt was seen  at [facility] on 07/21/2022 for a fall. Assessment:   This is a routine wellness examination for [place].  Hearing/Vision screen The pt denies any hearing issues. The pt wear glasses, no changes with her vision. Upcoming appt scheduled with [facility]. Dietary issues and exercise activities discussed: Current Exercise Habits: Structured exercise class, Type of exercise: walking, Time (Minutes): 10, Frequency (Times/Week): 7, Weekly Exercise (Minutes/Week): 70, Intensity: Mild, Exercise limited by: None identified   Goals Addressed   None    Depression Screen    10/30/2022   11:04 AM 10/01/2022   10:55 AM 07/31/2022   11:10 AM 07/02/2022    3:40 PM 03/31/2022   10:57 AM 01/02/2022    1:35 PM 12/05/2021    1:54 PM  PHQ 2/9 Scores  PHQ - 2 Score 4 0 0 6 0 0 1  PHQ- 9 Score 10 0 0 24 0 0 1    Fall Risk    10/30/2022   11:06 AM 10/01/2022   10:55 AM 07/31/2022   11:09 AM 07/02/2022    3:40 PM 03/31/2022   10:58 AM  Fall Risk   Falls in the past year? 1 1 0 1 1  Number falls in past yr: 0 1 0 1 1  Injury with Fall? 1 1 0 1 1  Risk for fall due to : No Fall Risks History of fall(s)  Impaired mobility;Impaired balance/gait;History of fall(s) No Fall Risks  Follow up Falls evaluation completed Falls evaluation completed;Falls prevention discussed Falls evaluation completed Falls prevention discussed Falls evaluation completed    FALL RISK PREVENTION PERTAINING TO THE HOME:  Any stairs in or around the home? No  If so, are  there any without handrails? No  Home free of loose throw rugs in walkways, pet beds, electrical cords, etc? Yes  Adequate lighting in your home to reduce risk of falls? Yes   ASSISTIVE DEVICES UTILIZED TO PREVENT FALLS:  Life alert? No  Use of a cane, walker or w/c? No  Grab bars in the bathroom? No  Shower chair or bench in shower? No  Elevated toilet seat or a handicapped toilet? No   TIMED UP AND GO:  Was the test performed? Yes .  Length of time to ambulate 10 feet: 10 sec.   Gait slow and steady without use of assistive device  Cognitive Function:        10/30/2022   11:08 AM 10/16/2020   11:46 AM 10/16/2019   11:32 AM 09/28/2018    9:19 AM 08/16/2017   10:34 AM  6CIT Screen  What Year? 0 points 0 points 0 points 0 points 0 points  What month? 0 points 0 points 0 points 0 points 0 points  What time? 0 points 0 points 0 points 0 points 0 points  Count back from 20 0 points 0 points 0 points 0 points 0 points  Months in reverse 0 points 0 points 0 points 0 points 0 points  Repeat phrase 8 points 0 points 0 points 2 points 2 points  Total Score 8 points 0 points 0 points 2 points 2 points    Immunizations Immunization History  Administered Date(s) Administered   Fluad Quad(high Dose 65+) 07/31/2019, 09/05/2021, 07/31/2022   Influenza Inj Mdck Quad Pf 08/19/2020   Influenza, High Dose Seasonal PF 08/16/2017, 08/07/2018   Influenza,inj,Quad PF,6+ Mos 09/09/2015   Influenza-Unspecified 07/24/2014, 08/07/2018, 08/26/2020   PFIZER(Purple Top)SARS-COV-2 Vaccination 01/17/2020, 02/07/2020, 10/11/2020   PNEUMOCOCCAL CONJUGATE-20 04/02/2022   Pneumococcal Conjugate-13 09/09/2015  Pneumococcal Polysaccharide-23 02/09/2013   Tdap 08/10/2011, 07/29/2021, 04/02/2022   Zoster Recombinat (Shingrix) 04/02/2022   Zoster, Live 02/27/2015    TDAP status: Up to date  Flu Vaccine status: Up to date  Pneumococcal vaccine status: Up to date  Covid-19 vaccine status:  Information provided on how to obtain vaccines.   Qualifies for Shingles Vaccine? Yes   Zostavax completed Yes   Shingrix Completed?: Yes  Screening Tests Health Maintenance  Topic Date Due   Zoster Vaccines- Shingrix (2 of 2) 05/28/2022   COVID-19 Vaccine (4 - 2023-24 season) 11/15/2022 (Originally 07/17/2022)   MAMMOGRAM  05/19/2023   Medicare Annual Wellness (AWV)  10/31/2023   DTaP/Tdap/Td (4 - Td or Tdap) 04/02/2032   Pneumonia Vaccine 80+ Years old  Completed   INFLUENZA VACCINE  Completed   DEXA SCAN  Completed   HPV VACCINES  Aged Out    Health Maintenance  Health Maintenance Due  Topic Date Due   Zoster Vaccines- Shingrix (2 of 2) 05/28/2022    Colorectal cancer screening: No longer required.   Mammogram status: Completed 10/30/2022. Repeat every year  DEXA Scan: 03/19/2015  Lung Cancer Screening: (Low Dose CT Chest recommended if Age 42-80 years, 30 pack-year currently smoking OR have quit w/in 15years.) does not qualify.   Lung Cancer Screening Referral: not applicable   Additional Screening:  Hepatitis C Screening: does not qualify Vision Screening: Recommended annual ophthalmology exams for early detection of glaucoma and other disorders of the eye. Is the patient up to date with their annual eye exam?  No Who is the provider or what is the name of the office in which the patient attends annual eye exams? [facility]  If pt is not established with a provider, would they like to be referred to a provider to establish care? No .   Dental Screening: Recommended annual dental exams for proper oral hygiene  Community Resource Referral / Chronic Care Management: CRR required this visit?  No   CCM required this visit?  No      Plan:     I have personally reviewed and noted the following in the patient's chart:   Medical and social history Use of alcohol, tobacco or illicit drugs  Current medications and supplements including opioid prescriptions.  Patient is not currently taking opioid prescriptions. Functional ability and status Nutritional status Physical activity Advanced directives List of other physicians Hospitalizations, surgeries, and ER visits in previous 12 months Vitals Screenings to include cognitive, depression, and falls Referrals and appointments  In addition, I have reviewed and discussed with patient certain preventive protocols, quality metrics, and best practice recommendations. A written personalized care plan for preventive services as well as general preventive health recommendations were provided to patient.    Ms. Matura , Thank you for taking time to come for your Medicare Wellness Visit. I appreciate your ongoing commitment to your health goals. Please review the following plan we discussed and let me know if I can assist you in the future.   These are the goals we discussed:  Goals      DIET - INCREASE WATER INTAKE     Recommend drinking 6-8 glasses of water per day     Prevent Falls     Fall prevention discussed        This is a list of the screening recommended for you and due dates:  Health Maintenance  Topic Date Due   Zoster (Shingles) Vaccine (2 of 2) 05/28/2022   COVID-19  Vaccine (4 - 2023-24 season) 11/15/2022*   Mammogram  05/19/2023   Medicare Annual Wellness Visit  10/31/2023   DTaP/Tdap/Td vaccine (4 - Td or Tdap) 04/02/2032   Pneumonia Vaccine  Completed   Flu Shot  Completed   DEXA scan (bone density measurement)  Completed   HPV Vaccine  Aged Out  *Topic was postponed. The date shown is not the original due date.      Wilson Singer, [place]   10/30/2022   Nurse Notes: Approximately 30 minute Face -To-Face Medicare Wellness Visit

## 2022-10-30 NOTE — Patient Instructions (Signed)

## 2022-11-11 DIAGNOSIS — H524 Presbyopia: Secondary | ICD-10-CM | POA: Diagnosis not present

## 2022-11-20 ENCOUNTER — Telehealth: Payer: Self-pay | Admitting: Family Medicine

## 2022-11-20 NOTE — Telephone Encounter (Signed)
Copied from [place] [id number]. Topic: General - Other >> Nov 20, 2022  1:42 PM [id number] wrote: Reason for CRM: pt called in to speak with Regina Macdonald, pt says that it is in regards to something dealing with her insurance. Pt declined my assistance states she prefer to speak with Regina Macdonald.   CB: [phone]

## 2022-11-29 ENCOUNTER — Other Ambulatory Visit: Payer: Self-pay | Admitting: Nurse Practitioner

## 2022-11-29 DIAGNOSIS — J4489 Other specified chronic obstructive pulmonary disease: Secondary | ICD-10-CM

## 2023-01-01 DIAGNOSIS — G4733 Obstructive sleep apnea (adult) (pediatric): Secondary | ICD-10-CM | POA: Diagnosis not present

## 2023-02-05 ENCOUNTER — Ambulatory Visit: Payer: Self-pay

## 2023-02-05 NOTE — Telephone Encounter (Signed)
Summary: Medication questions, symptoms   Pt is sneezing, nose is constantly running, right eye is watering. She wants to know if she can take something like benadryl in addition to Claritin. She does not want to have drug interference.  Best contact: [phone]     Phone picked up and hung up.  X2

## 2023-02-05 NOTE — Telephone Encounter (Signed)
Patient informed to try flonase, patady eye drops, and change to a different antihistamine.   Auda Finfrock

## 2023-02-05 NOTE — Telephone Encounter (Signed)
Unable to reach patient after 3 attempts by [facility] NT, routing to the provider for resolution per protocol.    Summary: Medication questions, symptoms   Pt is sneezing, nose is constantly running, right eye is watering. She wants to know if she can take something like benadryl in addition to Claritin. She does not want to have drug interference.  Best contact: [phone]

## 2023-02-05 NOTE — Telephone Encounter (Signed)
  Chief Complaint: Runny nose, runny eye, "I think it's the pollen, Claritin is not drying it up. " Wants to know from PCP what she can try OTC. Please advise pt. Symptoms: Above Frequency: Yesterday Pertinent Negatives: Patient denies fever Disposition: [] ED /[] Urgent Care (no appt availability in office) / [] Appointment(In office/virtual)/ []  [place] [facility]/ [] Home Care/ [] Refused Recommended Disposition /[] [facility] [facility]/ [x]  Follow-up with PCP Additional Notes: Please advise pt.  Answer Assessment - Initial Assessment Questions 1. SYMPTOM: "What's the main symptom you're concerned about?" (e.g., runny nose, stuffiness, sneezing, itching)     Runny nose 2. SEVERITY: "How bad is it?" "What does it keep you from doing?" (e.g., sleeping, working)      Moderate 3. EYES: "Are the eyes also red, watery, and itchy?"      Yes 4. TRIGGER: "What pollen or other allergic substance do you think is causing the symptoms?"      Pollen 5. TREATMENT: "What medicine are you using?" "What medicine worked best in the past?"     Claritin 6. OTHER SYMPTOMS: "Do you have any other symptoms?" (e.g., coughing, difficulty breathing, wheezing)     No 7. PREGNANCY: "Is there any chance you are pregnant?" "When was your last menstrual period?"     No  Protocols used: Nasal Allergies (Hay Fever)-A-AH

## 2023-02-05 NOTE — Telephone Encounter (Signed)
Summary: Medication questions, symptoms   Pt is sneezing, nose is constantly running, right eye is watering. She wants to know if she can take something like benadryl in addition to Claritin. She does not want to have drug interference.  Best contact: [phone]     Phone was picked up and hung up.

## 2023-02-10 ENCOUNTER — Encounter: Payer: Self-pay | Admitting: Family Medicine

## 2023-02-10 ENCOUNTER — Ambulatory Visit (INDEPENDENT_AMBULATORY_CARE_PROVIDER_SITE_OTHER): Payer: Medicare Other | Admitting: Family Medicine

## 2023-02-10 ENCOUNTER — Ambulatory Visit
Admission: RE | Admit: 2023-02-10 | Discharge: 2023-02-10 | Disposition: A | Discharge status: Home / Self Care | Payer: Medicare Other | Source: Ambulatory Visit | Attending: Family Medicine | Admitting: Family Medicine

## 2023-02-10 ENCOUNTER — Other Ambulatory Visit
Admission: RE | Admit: 2023-02-10 | Discharge: 2023-02-10 | Disposition: A | Discharge status: Home / Self Care | Payer: Medicare Other | Source: Home / Self Care | Attending: Family Medicine | Admitting: Family Medicine

## 2023-02-10 ENCOUNTER — Ambulatory Visit
Admission: RE | Admit: 2023-02-10 | Discharge: 2023-02-10 | Disposition: A | Discharge status: Home / Self Care | Payer: Medicare Other | Attending: Family Medicine | Admitting: Family Medicine

## 2023-02-10 VITALS — BP 120/80 | HR 69 | Temp 98.4°F | Ht 62.5 in | Wt 179.0 lb

## 2023-02-10 DIAGNOSIS — R0602 Shortness of breath: Secondary | ICD-10-CM | POA: Diagnosis not present

## 2023-02-10 DIAGNOSIS — J209 Acute bronchitis, unspecified: Secondary | ICD-10-CM

## 2023-02-10 DIAGNOSIS — J440 Chronic obstructive pulmonary disease with acute lower respiratory infection: Secondary | ICD-10-CM | POA: Diagnosis not present

## 2023-02-10 DIAGNOSIS — J441 Chronic obstructive pulmonary disease with (acute) exacerbation: Secondary | ICD-10-CM

## 2023-02-10 DIAGNOSIS — R059 Cough, unspecified: Secondary | ICD-10-CM | POA: Diagnosis not present

## 2023-02-10 LAB — CBC WITH DIFFERENTIAL/PLATELET
Abs Immature Granulocytes: 0.02 10*3/uL (ref 0.00–0.07)
Basophils Absolute: 0.1 10*3/uL (ref 0.0–0.1)
Basophils Relative: 2 %
Eosinophils Absolute: 0.2 10*3/uL (ref 0.0–0.5)
Eosinophils Relative: 2 %
HCT: 34.6 % — ABNORMAL LOW (ref 36.0–46.0)
Hemoglobin: 11.4 g/dL — ABNORMAL LOW (ref 12.0–15.0)
Immature Granulocytes: 0 %
Lymphocytes Relative: 25 %
Lymphs Abs: 2 10*3/uL (ref 0.7–4.0)
MCH: 30.6 pg (ref 26.0–34.0)
MCHC: 32.9 g/dL (ref 30.0–36.0)
MCV: 93 fL (ref 80.0–100.0)
Monocytes Absolute: 0.5 10*3/uL (ref 0.1–1.0)
Monocytes Relative: 6 %
Neutro Abs: 5.3 10*3/uL (ref 1.7–7.7)
Neutrophils Relative %: 65 %
Platelets: 294 10*3/uL (ref 150–400)
RBC: 3.72 MIL/uL — ABNORMAL LOW (ref 3.87–5.11)
RDW: 13.1 % (ref 11.5–15.5)
WBC: 8 10*3/uL (ref 4.0–10.5)
nRBC: 0 % (ref 0.0–0.2)

## 2023-02-10 MED ORDER — PREDNISONE 10 MG PO TABS: 10.0000 mg | ORAL_TABLET | Freq: Every day | ORAL | 0 refills | Status: DC

## 2023-02-10 MED ORDER — AZITHROMYCIN 250 MG PO TABS: ORAL_TABLET | ORAL | 0 refills | Status: AC

## 2023-02-10 NOTE — Progress Notes (Signed)
Date:  02/10/2023   Name:  Regina Macdonald   DOB:  07-02-1941   MRN:  [id number]   Chief Complaint: Cough (No appetite)  Cough This is a new problem. The current episode started in the past 7 days (worse last 48 hours). The problem has been waxing and waning. The problem occurs every few minutes. The cough is Non-productive. Associated symptoms include shortness of breath and wheezing. Pertinent negatives include no chest pain, chills, ear congestion, fever, hemoptysis, postnasal drip, rhinorrhea, sore throat or sweats. Nothing aggravates the symptoms. She has tried a beta-agonist inhaler (only breo) for the symptoms. The treatment provided moderate relief.    Lab Results  Component Value Date   NA 141 07/21/2022   K 3.7 07/21/2022   CO2 24 07/21/2022   GLUCOSE 102 (H) 07/21/2022   BUN 15 07/21/2022   CREATININE 0.82 07/21/2022   CALCIUM 9.4 07/21/2022   EGFR 79 03/31/2022   GFRNONAA >60 07/21/2022   Lab Results  Component Value Date   CHOL 131 10/01/2022   HDL 36 (L) 10/01/2022   LDLCALC 75 10/01/2022   TRIG 107 10/01/2022   CHOLHDL 3.0 08/19/2017   Lab Results  Component Value Date   TSH 3.16 01/24/2021   Lab Results  Component Value Date   HGBA1C 5.7 (H) 12/02/2015   Lab Results  Component Value Date   WBC 8.0 02/10/2023   HGB 11.4 (L) 02/10/2023   HCT 34.6 (L) 02/10/2023   MCV 93.0 02/10/2023   PLT 294 02/10/2023   Lab Results  Component Value Date   ALT 16 07/21/2022   AST 28 07/21/2022   ALKPHOS 242 (H) 07/21/2022   BILITOT 0.8 07/21/2022   No results found for: "25OHVITD2", "25OHVITD3", "VD25OH"   Review of Systems  Constitutional:  Negative for chills and fever.  HENT:  Negative for ear discharge, postnasal drip, rhinorrhea, sinus pressure, sinus pain, sneezing and sore throat.   Respiratory:  Positive for cough, shortness of breath and wheezing. Negative for hemoptysis and chest tightness.   Cardiovascular:  Negative for chest pain, palpitations  and leg swelling.    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   Anxiety 03/23/2018   Taking medication for chronic disease 03/23/2018   Other constipation 12/13/2017   Depression with anxiety 08/19/2017   Hyperlipidemia 08/19/2017   Gastroesophageal reflux disease without esophagitis 08/19/2017   Other chest pain 04/16/2015   COPD exacerbation ([place]) 04/16/2015   Sleep apnea 04/16/2015   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   Asthmatic bronchitis 04/16/2015   Abdominal pain, LLQ 03/29/2014   Rectal bleeding 03/29/2014   Arthritis, 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   Osteoarthrosis, unspecified whether generalized or localized, pelvic region and thigh 03/26/2014    Allergies  Allergen Reactions   Ciprofloxacin Other (See Comments)   Etodolac Other (See Comments)   Tramadol Other (See Comments)    Slurred speech/ confusion   Sulfa Antibiotics Rash    Past Surgical History:  Procedure Laterality Date   ABDOMINAL HYSTERECTOMY     COLONOSCOPY  06/08/2013   Dr Candace Cruise- small mouth diverticula   HIP SURGERY Left    KNEE SURGERY Bilateral    NASAL RECONSTRUCTION     x 2    Social History   Tobacco Use  Smoking status: Former   Smokeless tobacco: Never  Vaping Use   Vaping Use: Never used  Substance Use Topics   Alcohol use: Not Currently   Drug use: Never     Medication list has been reviewed and updated.  Current Meds  Medication Sig   acetaminophen (TYLENOL) 500 MG tablet Take 500 mg by mouth every 6 (six) hours as needed.   aspirin 81 MG chewable tablet Chew 81 mg by mouth daily.   BREO ELLIPTA 100-25 MCG/ACT AEPB INHALE 1 PUFF ONCE DAILY   CALCIUM & MAGNESIUM CARBONATES PO Take 1 tablet by mouth daily.   EQ LORATADINE 10 MG tablet Take 1  tablet (10 mg total) by mouth daily.   EQUATE STOOL SOFTENER 100 MG capsule Take 1 capsule by mouth twice daily   FLUoxetine (PROZAC) 10 MG capsule Take 1 capsule (10 mg total) by mouth daily.   FLUoxetine (PROZAC) 20 MG capsule Take 1 capsule (20 mg total) by mouth daily.   fluticasone (FLONASE) 50 MCG/ACT nasal spray Use 2 sprays in each nostril daily   gemfibrozil (LOPID) 600 MG tablet Take 1 tablet (600 mg total) by mouth 2 (two) times daily.   ibuprofen (ADVIL) 200 MG tablet Take 400 mg by mouth every 6 (six) hours as needed for moderate pain.   Multiple Vitamins-Minerals (CENTRUM SILVER PO) Take 1 capsule by mouth daily.   pantoprazole (PROTONIX) 40 MG tablet Take 1 tablet (40 mg total) by mouth daily.   polyethylene glycol (MIRALAX) packet Take 17 g by mouth daily.       02/10/2023   11:06 AM 10/01/2022   10:55 AM 07/31/2022   11:10 AM 07/02/2022    3:41 PM  GAD 7 : Generalized Anxiety Score  Nervous, Anxious, on Edge 0 0 0 2  Control/stop worrying 0 0 0 3  Worry too much - different things 0 0 0 3  Trouble relaxing 0 0 0 3  Restless 0 0 0 3  Easily annoyed or irritable 0 0 0 3  Afraid - awful might happen 0 0 0 3  Total GAD 7 Score 0 0 0 20  Anxiety Difficulty Not difficult at all Not difficult at all Not difficult at all Not difficult at all       02/10/2023   11:06 AM 10/30/2022   11:04 AM 10/01/2022   10:55 AM  Depression screen PHQ 2/9  Decreased Interest 0 2 0  Down, Depressed, Hopeless 0 2 0  PHQ - 2 Score 0 4 0  Altered sleeping 0 0 0  Tired, decreased energy 0 2 0  Change in appetite 0 0 0  Feeling bad or failure about yourself  0 3 0  Trouble concentrating 0 0 0  Moving slowly or fidgety/restless 0 1 0  Suicidal thoughts 0 0 0  PHQ-9 Score 0 10 0  Difficult doing work/chores Not difficult at all Somewhat difficult Not difficult at all    BP Readings from Last 3 Encounters:  02/10/23 120/80  10/30/22 118/64  10/01/22 128/78    Physical Exam Vitals  and nursing note reviewed.  HENT:     Right Ear: Tympanic membrane, ear canal and external ear normal.     Left Ear: Tympanic membrane, ear canal and external ear normal.     Nose: Nose normal.     Mouth/Throat:     Mouth: Mucous membranes are moist.     Pharynx: Oropharynx is clear.  Eyes:     Pupils: Pupils  are equal, round, and reactive to light.  Cardiovascular:     Rate and Rhythm: Normal rate and regular rhythm.     Heart sounds: S1 normal and S2 normal. No murmur heard.    No systolic murmur is present.     No diastolic murmur is present.     No friction rub. No gallop. No S3 or S4 sounds.  Pulmonary:     Effort: Pulmonary effort is normal. No respiratory distress.     Breath sounds: Examination of the right-lower field reveals decreased breath sounds. Decreased breath sounds present. No wheezing, rhonchi or rales.  Abdominal:     General: Bowel sounds are normal.     Palpations: There is no hepatomegaly or splenomegaly.  Musculoskeletal:     Cervical back: Neck supple.     Right lower leg: No edema.     Left lower leg: No edema.  Lymphadenopathy:     Cervical: No cervical adenopathy.  Neurological:     Mental Status: She is alert.     Wt Readings from Last 3 Encounters:  02/10/23 179 lb (81.2 kg)  10/30/22 178 lb (80.7 kg)  10/01/22 178 lb (80.7 kg)    BP 120/80 (BP Location: Left Arm, Cuff Size: Normal)   Pulse 69   Temp 98.4 F (36.9 C) (Oral)   Ht 5' 2.5" (1.588 m)   Wt 179 lb (81.2 kg)   SpO2 97%   BMI 32.22 kg/m   Assessment and Plan:  1. SOB (shortness of breath) on exertion Chronic.  Episodic.  Has worsened over the past 48 hours.  Patient with nonproductive cough no fever or chills with gradually worsening dyspnea on exertion.  Will obtain chest x-ray and CBC for evaluation. - DG Chest 2 View  2. COPD exacerbation ([place]) Chest x-ray notes no cardiopulmonary disease to suggest pneumonia in the decreased breath sounds in the right posterior lower  lobe is consistent with an elevated hemidiaphragm.  In addition to her [facility] I have suggested that she use her albuterol short acting 1 to 2 puffs every 6 hours and we will initiate prednisone 10 mg once a day. - predniSONE (DELTASONE) 10 MG tablet; Take 1 tablet (10 mg total) by mouth daily with breakfast.  Dispense: 30 tablet; Refill: 0  3. COPD with acute bronchitis ([place]) In the past patient has done well on antibiotics when there is been exacerbation of her COPD.  Given the nonproductive nature of her cough we will initiate azithromycin to 50 mg 2 tablets a day followed by 1 a day for 4 days.  We will recheck patient in 2 days with instructions she is to go to an emergency room setting if there is worsening of symptoms. - azithromycin (ZITHROMAX) 250 MG tablet; Take 2 tablets on day 1, then 1 tablet daily on days 2 through 5  Dispense: 6 tablet; Refill: 0    Otilio Miu, MD

## 2023-02-12 ENCOUNTER — Ambulatory Visit (INDEPENDENT_AMBULATORY_CARE_PROVIDER_SITE_OTHER): Payer: Medicare Other | Admitting: Family Medicine

## 2023-02-12 ENCOUNTER — Encounter: Payer: Self-pay | Admitting: Family Medicine

## 2023-02-12 VITALS — BP 138/70 | HR 68 | Ht 62.5 in | Wt 180.0 lb

## 2023-02-12 DIAGNOSIS — J440 Chronic obstructive pulmonary disease with acute lower respiratory infection: Secondary | ICD-10-CM

## 2023-02-12 DIAGNOSIS — J209 Acute bronchitis, unspecified: Secondary | ICD-10-CM

## 2023-02-12 NOTE — Progress Notes (Signed)
Date:  02/12/2023   Name:  Regina Macdonald   DOB:  Apr 17, 1941   MRN:  [id number]   Chief Complaint: Follow-up (SOB)  Asthma There is no chest tightness, cough, difficulty breathing, frequent throat clearing, hemoptysis, shortness of breath, sputum production or wheezing. This is a recurrent problem. The current episode started in the past 7 days. The problem has been gradually improving. Pertinent negatives include no chest pain, sore throat or trouble swallowing. Her symptoms are alleviated by beta-agonist, oral steroids and steroid inhaler. She reports moderate improvement on treatment. Her past medical history is significant for asthma.    Lab Results  Component Value Date   NA 141 07/21/2022   K 3.7 07/21/2022   CO2 24 07/21/2022   GLUCOSE 102 (H) 07/21/2022   BUN 15 07/21/2022   CREATININE 0.82 07/21/2022   CALCIUM 9.4 07/21/2022   EGFR 79 03/31/2022   GFRNONAA >60 07/21/2022   Lab Results  Component Value Date   CHOL 131 10/01/2022   HDL 36 (L) 10/01/2022   LDLCALC 75 10/01/2022   TRIG 107 10/01/2022   CHOLHDL 3.0 08/19/2017   Lab Results  Component Value Date   TSH 3.16 01/24/2021   Lab Results  Component Value Date   HGBA1C 5.7 (H) 12/02/2015   Lab Results  Component Value Date   WBC 8.0 02/10/2023   HGB 11.4 (L) 02/10/2023   HCT 34.6 (L) 02/10/2023   MCV 93.0 02/10/2023   PLT 294 02/10/2023   Lab Results  Component Value Date   ALT 16 07/21/2022   AST 28 07/21/2022   ALKPHOS 242 (H) 07/21/2022   BILITOT 0.8 07/21/2022   No results found for: "25OHVITD2", "25OHVITD3", "VD25OH"   Review of Systems  HENT:  Negative for sinus pressure, sinus pain, sore throat and trouble swallowing.   Eyes:  Negative for visual disturbance.  Respiratory:  Negative for cough, hemoptysis, sputum production, chest tightness, shortness of breath, wheezing and stridor.   Cardiovascular:  Negative for chest pain, palpitations and leg swelling.  Gastrointestinal:   Negative for abdominal pain.    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   Anxiety 03/23/2018   Taking medication for chronic disease 03/23/2018   Other constipation 12/13/2017   Depression with anxiety 08/19/2017   Hyperlipidemia 08/19/2017   Gastroesophageal reflux disease without esophagitis 08/19/2017   Other chest pain 04/16/2015   COPD exacerbation ([place]) 04/16/2015   Sleep apnea 04/16/2015   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   Asthmatic bronchitis 04/16/2015   Abdominal pain, LLQ 03/29/2014   Rectal bleeding 03/29/2014   Arthritis, 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   Osteoarthrosis, unspecified whether generalized or localized, pelvic region and thigh 03/26/2014    Allergies  Allergen Reactions   Ciprofloxacin Other (See Comments)   Etodolac Other (See Comments)   Tramadol Other (See Comments)    Slurred speech/ confusion   Sulfa Antibiotics Rash    Past Surgical History:  Procedure Laterality Date   ABDOMINAL HYSTERECTOMY     COLONOSCOPY  06/08/2013   Dr Candace Cruise- small mouth diverticula   HIP SURGERY Left    KNEE SURGERY Bilateral    NASAL RECONSTRUCTION     x 2    Social History   Tobacco Use   Smoking status: Former  Smokeless tobacco: Never  Vaping Use   Vaping Use: Never used  Substance Use Topics   Alcohol use: Not Currently   Drug use: Never     Medication list has been reviewed and updated.  Current Meds  Medication Sig   acetaminophen (TYLENOL) 500 MG tablet Take 500 mg by mouth every 6 (six) hours as needed.   aspirin 81 MG chewable tablet Chew 81 mg by mouth daily.   azithromycin (ZITHROMAX) 250 MG tablet Take 2 tablets on day 1, then 1 tablet daily on days 2 through 5   BREO ELLIPTA 100-25 MCG/ACT AEPB INHALE 1  PUFF ONCE DAILY   CALCIUM & MAGNESIUM CARBONATES PO Take 1 tablet by mouth daily.   EQ LORATADINE 10 MG tablet Take 1 tablet (10 mg total) by mouth daily.   EQUATE STOOL SOFTENER 100 MG capsule Take 1 capsule by mouth twice daily   FLUoxetine (PROZAC) 10 MG capsule Take 1 capsule (10 mg total) by mouth daily.   FLUoxetine (PROZAC) 20 MG capsule Take 1 capsule (20 mg total) by mouth daily.   fluticasone (FLONASE) 50 MCG/ACT nasal spray Use 2 sprays in each nostril daily   gemfibrozil (LOPID) 600 MG tablet Take 1 tablet (600 mg total) by mouth 2 (two) times daily.   ibuprofen (ADVIL) 200 MG tablet Take 400 mg by mouth every 6 (six) hours as needed for moderate pain.   Multiple Vitamins-Minerals (CENTRUM SILVER PO) Take 1 capsule by mouth daily.   pantoprazole (PROTONIX) 40 MG tablet Take 1 tablet (40 mg total) by mouth daily.   polyethylene glycol (MIRALAX) packet Take 17 g by mouth daily.   predniSONE (DELTASONE) 10 MG tablet Take 1 tablet (10 mg total) by mouth daily with breakfast.       02/12/2023    3:15 PM 02/10/2023   11:06 AM 10/01/2022   10:55 AM 07/31/2022   11:10 AM  GAD 7 : Generalized Anxiety Score  Nervous, Anxious, on Edge 0 0 0 0  Control/stop worrying 0 0 0 0  Worry too much - different things 0 0 0 0  Trouble relaxing 0 0 0 0  Restless 0 0 0 0  Easily annoyed or irritable 0 0 0 0  Afraid - awful might happen 0 0 0 0  Total GAD 7 Score 0 0 0 0  Anxiety Difficulty Not difficult at all Not difficult at all Not difficult at all Not difficult at all       02/12/2023    3:15 PM 02/10/2023   11:06 AM 10/30/2022   11:04 AM  Depression screen PHQ 2/9  Decreased Interest 0 0 2  Down, Depressed, Hopeless 0 0 2  PHQ - 2 Score 0 0 4  Altered sleeping 0 0 0  Tired, decreased energy 0 0 2  Change in appetite 0 0 0  Feeling bad or failure about yourself  0 0 3  Trouble concentrating 0 0 0  Moving slowly or fidgety/restless 0 0 1  Suicidal thoughts 0 0 0  PHQ-9 Score 0 0 10   Difficult doing work/chores Not difficult at all Not difficult at all Somewhat difficult    BP Readings from Last 3 Encounters:  02/12/23 138/70  02/10/23 120/80  10/30/22 118/64    Physical Exam HENT:     Mouth/Throat:     Mouth: Mucous membranes are moist.  Cardiovascular:     Rate and Rhythm: Normal rate.     Heart sounds: No murmur heard. Pulmonary:  Effort: No respiratory distress.     Breath sounds: Normal breath sounds. No stridor. No wheezing, rhonchi or rales.  Chest:     Chest wall: No tenderness.     Wt Readings from Last 3 Encounters:  02/12/23 180 lb (81.6 kg)  02/10/23 179 lb (81.2 kg)  10/30/22 178 lb (80.7 kg)    BP 138/70   Pulse 68   Ht 5' 2.5" (1.588 m)   Wt 180 lb (81.6 kg)   SpO2 98%   BMI 32.40 kg/m   Assessment and Plan:  1. 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  Stable.  Improved on current regimen.  Patient seen 48 hours previous with an acute exacerbation requiring initiation of azithromycin to 50 mg 2 on starting day 1 a day thereafter for 4 days, Breo with rescue albuterol inhaler, and oral prednisone 10 mg once a day.  Patient will continue with current regimen as that she has had significant improvement   Otilio Miu, MD

## 2023-03-20 ENCOUNTER — Other Ambulatory Visit: Payer: Self-pay | Admitting: Family Medicine

## 2023-03-20 DIAGNOSIS — J441 Chronic obstructive pulmonary disease with (acute) exacerbation: Secondary | ICD-10-CM

## 2023-03-22 NOTE — Telephone Encounter (Signed)
Requested medication (s) are due for refill today: yes  Requested medication (s) are on the active medication list: yes  Last refill:  02/10/23  Future visit scheduled: yes  Notes to clinic:  Unable to refill per protocol, cannot delegate.      Requested Prescriptions  Pending Prescriptions Disp Refills   predniSONE (DELTASONE) 10 MG tablet [Pharmacy Med Name: PREDNISONE 10MG ** TABLETS] 90 tablet     Sig: TAKE 1 TABLET(10 MG) BY MOUTH DAILY WITH BREAKFAST     Not Delegated - Endocrinology:  Oral Corticosteroids Failed - 03/20/2023  3:16 PM      Failed - This refill cannot be delegated      Failed - Manual Review: Eye exam for IOP if prolonged treatment      Failed - Glucose (serum) in normal range and within 180 days    Glucose  Date Value Ref Range Status  12/31/2013 99 65 - 99 mg/dL Final   Glucose, Bld  Date Value Ref Range Status  07/21/2022 102 (H) 70 - 99 mg/dL Final    Comment:    Glucose reference range applies only to samples taken after fasting for at least 8 hours.         Failed - K in normal range and within 180 days    Potassium  Date Value Ref Range Status  07/21/2022 3.7 3.5 - 5.1 mmol/L Final  12/31/2013 3.7 3.5 - 5.1 mmol/L Final         Failed - Na in normal range and within 180 days    Sodium  Date Value Ref Range Status  07/21/2022 141 135 - 145 mmol/L Final  03/31/2022 138 134 - 144 mmol/L Final  12/31/2013 139 136 - 145 mmol/L Final         Failed - Bone Mineral Density or Dexa Scan completed in the last 2 years      Passed - Last BP in normal range    BP Readings from Last 1 Encounters:  02/12/23 138/70         Passed - Valid encounter within last 6 months    Recent Outpatient Visits           1 month ago COPD with acute bronchitis (HCC)   [facility] In[facility], MD   1 month ago SOB (shortness of breath) on exertion   [facility]ineas Inches, MD   5 months ago Anxiety   [facility] Inches, MD   7 months ago Expressive dysphasia   [facility] Inches, MD   8 months ago Right hip pain   [place] [facility] Inches, MD       Future Appointments             In 1 week Duanne Limerick, MD [facility] at [facility], [facility]

## 2023-03-29 ENCOUNTER — Encounter: Payer: Self-pay | Admitting: Physician Assistant

## 2023-03-29 ENCOUNTER — Ambulatory Visit: Payer: Medicare Other | Admitting: Physician Assistant

## 2023-03-29 VITALS — BP 130/80 | HR 69 | Temp 97.8°F | Resp 16 | Ht 62.0 in | Wt 180.0 lb

## 2023-03-29 DIAGNOSIS — J986 Disorders of diaphragm: Secondary | ICD-10-CM

## 2023-03-29 DIAGNOSIS — G4733 Obstructive sleep apnea (adult) (pediatric): Secondary | ICD-10-CM

## 2023-03-29 DIAGNOSIS — R0602 Shortness of breath: Secondary | ICD-10-CM

## 2023-03-29 DIAGNOSIS — J4489 Other specified chronic obstructive pulmonary disease: Secondary | ICD-10-CM

## 2023-03-29 MED ORDER — ALBUTEROL SULFATE HFA 108 (90 BASE) MCG/ACT IN AERS: 2.0000 | INHALATION_SPRAY | Freq: Four times a day (QID) | RESPIRATORY_TRACT | 0 refills | Status: DC | PRN

## 2023-03-29 NOTE — Progress Notes (Signed)
[facility] [address] [place], [place] [address]  Pulmonary Sleep Medicine   Office Visit Note  Patient Name: Regina Macdonald DOB: [birth year] MRN [id number]  Date of Service: 03/29/2023  Complaints/HPI: Pt is here for routine pulmonary follow up. Using breo daily, using albuterol as needed. Does have some cough first thing in Am after CPAP. Using cpap nightly and changing supplies regularly and keeping it clean. 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  Office Spirometry Results: Peak Flow: (!) 4 L/min FEV1: 1.23 liters FVC: 1.51 liters FEV1/FVC: 81.5 % FVC  % Predicted: 68 % FEV % Predicted: 74 % FeF 25-75: 1.53 liters FeF 25-75 % Predicted: 135   ROS  General: (-) fever, (-) chills, (-) night sweats, (-) weakness Skin: (-) rashes, (-) itching,. Eyes: (-) visual changes, (-) redness, (-) itching. Nose and Sinuses: (-) nasal stuffiness or itchiness, (-) postnasal drip, (-) nosebleeds, (-) sinus trouble. Mouth and Throat: (-) sore throat, (-) hoarseness. Neck: (-) swollen glands, (-) enlarged thyroid, (-) neck pain. Respiratory: + cough, (-) bloody sputum, + shortness of breath, - wheezing. Cardiovascular: - ankle swelling, (-) chest pain. Lymphatic: (-) lymph node enlargement. Neurologic: (-) numbness, (-) tingling. Psychiatric: (-) anxiety, (-) depression   Current Medication: Outpatient Encounter Medications as of 03/29/2023  Medication Sig   acetaminophen (TYLENOL) 500 MG tablet Take 500 mg by mouth every 6 (six) hours as needed.   albuterol (VENTOLIN HFA) 108 (90 Base) MCG/ACT inhaler Inhale 2 puffs into the lungs every 6 (six) hours as needed for wheezing or shortness of breath.   aspirin 81 MG chewable tablet Chew 81 mg by mouth daily.   BREO ELLIPTA 100-25 MCG/ACT AEPB INHALE 1 PUFF ONCE DAILY   CALCIUM & MAGNESIUM CARBONATES PO Take 1 tablet by mouth daily.   EQ LORATADINE 10 MG tablet Take  1 tablet (10 mg total) by mouth daily.   EQUATE STOOL SOFTENER 100 MG capsule Take 1 capsule by mouth twice daily   FLUoxetine (PROZAC) 10 MG capsule Take 1 capsule (10 mg total) by mouth daily.   FLUoxetine (PROZAC) 20 MG capsule Take 1 capsule (20 mg total) by mouth daily.   fluticasone (FLONASE) 50 MCG/ACT nasal spray Use 2 sprays in each nostril daily   gemfibrozil (LOPID) 600 MG tablet Take 1 tablet (600 mg total) by mouth 2 (two) times daily.   ibuprofen (ADVIL) 200 MG tablet Take 400 mg by mouth every 6 (six) hours as needed for moderate pain.   Multiple Vitamins-Minerals (CENTRUM SILVER PO) Take 1 capsule by mouth daily.   pantoprazole (PROTONIX) 40 MG tablet Take 1 tablet (40 mg total) by mouth daily.   polyethylene glycol (MIRALAX) packet Take 17 g by mouth daily.   [DISCONTINUED] predniSONE (DELTASONE) 10 MG tablet Take 1 tablet (10 mg total) by mouth daily with breakfast.   No facility-administered encounter medications on file as of 03/29/2023.    Surgical History: Past Surgical History:  Procedure Laterality Date   ABDOMINAL HYSTERECTOMY     COLONOSCOPY  06/08/2013   Dr Bluford Kaufmann- small mouth diverticula   HIP SURGERY Left    KNEE SURGERY Bilateral    NASAL RECONSTRUCTION     x 2    Medical History: Past Medical History:  Diagnosis Date   Anxiety    Asthma    Cancer (HCC) cervical-1973   COPD (chronic obstructive pulmonary disease) (HCC)    Depression    Diverticula, colon  Diverticulosis    GERD (gastroesophageal reflux disease)    Sleep apnea     Family History: Family History  Problem Relation Age of Onset   Cirrhosis Father    Coronary artery disease Mother    Breast cancer Neg Hx     Social History: Social History   Socioeconomic History   Marital status: Divorced    Spouse name: Not on file   Number of children: 3   Years of education: 12   Highest education level: High school graduate  Occupational History   Occupation: retired  Tobacco Use    Smoking status: Former   Smokeless tobacco: Never  Building services engineer Use: Never used  Substance and Sexual Activity   Alcohol use: Not Currently   Drug use: Never   Sexual activity: Not Currently    Partners: Male    Birth control/protection: Post-menopausal  Other Topics Concern   Not on file  Social History Narrative   Pt lives with her daughter   Social Determinants of Health   Financial Resource Strain: Low Risk  (10/30/2022)   Overall Financial Resource Strain (CARDIA)    Difficulty of Paying Living Expenses: Not hard at all  Food Insecurity: No Food Insecurity (10/30/2022)   Hunger Vital Sign    Worried About Running Out of Food in the Last Year: Never true    Ran Out of Food in the Last Year: Never true  Transportation Needs: No Transportation Needs (10/30/2022)   PRAPARE - Administrator, Civil Service (Medical): No    Lack of Transportation (Non-Medical): No  Physical Activity: Insufficiently Active (10/30/2022)   Exercise Vital Sign    Days of Exercise per Week: 7 days    Minutes of Exercise per Session: 10 min  Stress: No Stress Concern Present (10/30/2022)   [employer] of Occupational Health - Occupational Stress Questionnaire    Feeling of Stress : Only a little  Social Connections: Socially Isolated (10/30/2022)   Social Connection and Isolation Panel [NHANES]    Frequency of Communication with Friends and Family: More than three times a week    Frequency of Social Gatherings with Friends and Family: Three times a week    Attends Religious Services: Never    Active Member of Clubs or Organizations: No    Attends Banker Meetings: Never    Marital Status: Divorced  Catering manager Violence: Not At Risk (10/30/2022)   Humiliation, Afraid, Rape, and Kick questionnaire    Fear of Current or Ex-Partner: No    Emotionally Abused: No    Physically Abused: No    Sexually Abused: No    Vital Signs: Blood pressure 130/80,  pulse 69, temperature 97.8 F (36.6 C), resp. rate 16, height 5\' 2"  (1.575 m), weight 180 lb (81.6 kg), SpO2 97 %, peak flow (!) 4 L/min.  Examination: General Appearance: The patient is well-developed, well-nourished, and in no distress. Skin: Gross inspection of skin unremarkable. Head: normocephalic, no gross deformities. Eyes: no gross deformities noted. ENT: ears appear grossly normal no exudates. Neck: Supple. No thyromegaly. No LAD. Respiratory: Lungs clear to auscultation bilaterally. Cardiovascular: Normal S1 and S2 without murmur or rub. Extremities: No cyanosis. pulses are equal. Neurologic: Alert and oriented. No involuntary movements.  LABS: Recent Results (from the past 2160 hour(s))  CBC with Differential/Platelet     Status: Abnormal   Collection Time: 02/10/23 11:38 AM  Result Value Ref Range   WBC 8.0 4.0 - 10.5 K/uL  RBC 3.72 (L) 3.87 - 5.11 MIL/uL   Hemoglobin 11.4 (L) 12.0 - 15.0 g/dL   HCT 16.1 (L) 09.6 - 04.5 %   MCV 93.0 80.0 - 100.0 fL   MCH 30.6 26.0 - 34.0 pg   MCHC 32.9 30.0 - 36.0 g/dL   RDW 40.9 81.1 - 91.4 %   Platelets 294 150 - 400 K/uL   nRBC 0.0 0.0 - 0.2 %   Neutrophils Relative % 65 %   Neutro Abs 5.3 1.7 - 7.7 K/uL   Lymphocytes Relative 25 %   Lymphs Abs 2.0 0.7 - 4.0 K/uL   Monocytes Relative 6 %   Monocytes Absolute 0.5 0.1 - 1.0 K/uL   Eosinophils Relative 2 %   Eosinophils Absolute 0.2 0.0 - 0.5 K/uL   Basophils Relative 2 %   Basophils Absolute 0.1 0.0 - 0.1 K/uL   Immature Granulocytes 0 %   Abs Immature Granulocytes 0.02 0.00 - 0.07 K/uL    Comment: Performed at [facility], [address], [place], [place] [address]    Radiology: DG Chest 2 View  Result Date: 02/10/2023 CLINICAL DATA:  SOB, decreased O2 when walking/ cough EXAM: CHEST - 2 VIEW COMPARISON:  Chest x-ray September 28, 2018. FINDINGS: Low lung volumes. Similar elevated right hemidiaphragm. No consolidation. No visible pleural effusions or  pneumothorax. Similar cardiomediastinal silhouette. IMPRESSION: No active cardiopulmonary disease. Electronically Signed   By: Feliberto Harts M.D.   On: 02/10/2023 12:20    No results found.  No results found.    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 Obstructive chronic bronchitis without exacerbation Continue inhalers as prescribed, will update PFT in 6 months - albuterol (VENTOLIN HFA) 108 (90 Base) MCG/ACT inhaler; Inhale 2 puffs into the lungs every 6 (six) hours as needed for wheezing or shortness of breath.  Dispense: 8 g; Refill: 0 - Pulmonary Function Test; Future  2. OSA on CPAP Continue cpap nightly  3. Diaphragm dysfunction Supportive care  4. Shortness of breath - Spirometry with Graph   General Counseling: I have discussed the findings of the evaluation and examination with [place].  I have also discussed any further diagnostic evaluation thatmay be needed or ordered today. Mikaelah verbalizes understanding of the findings of todays visit. We also reviewed her medications today and discussed drug  interactions and side effects including but not limited excessive drowsiness and altered mental states. We also discussed that there is always a risk not just to her but also people around her. she has been encouraged to call the office with any questions or concerns that should arise related to todays visit.  Orders Placed This Encounter  Procedures   Spirometry with  Graph    Order Specific Question:   Where should this test be performed?    Answer:   [facility]    Order Specific Question:   Basic spirometry    Answer:   Yes   Pulmonary Function Test    Standing Status:   Future    Standing Expiration Date:   03/28/2024    Order Specific Question:   Where should this test be performed?    Answer:   [facility]     Time spent: 39  I have personally obtained a history, examined the patient, evaluated laboratory and imaging results, formulated the assessment and plan and placed orders. This patient was seen by Lynn Ito, PA-C in collaboration with Dr. Freda Munro as a part of collaborative care agreement.     Yevonne Pax, MD [facility] Pulmonary and Critical Care Sleep medicine

## 2023-04-01 ENCOUNTER — Encounter: Payer: Self-pay | Admitting: Family Medicine

## 2023-04-01 ENCOUNTER — Ambulatory Visit (INDEPENDENT_AMBULATORY_CARE_PROVIDER_SITE_OTHER): Payer: Medicare Other | Admitting: Family Medicine

## 2023-04-01 VITALS — BP 130/88 | HR 70 | Ht 62.0 in | Wt 178.0 lb

## 2023-04-01 DIAGNOSIS — J309 Allergic rhinitis, unspecified: Secondary | ICD-10-CM | POA: Diagnosis not present

## 2023-04-01 DIAGNOSIS — F320 Major depressive disorder, single episode, mild: Secondary | ICD-10-CM | POA: Diagnosis not present

## 2023-04-01 DIAGNOSIS — F419 Anxiety disorder, unspecified: Secondary | ICD-10-CM | POA: Diagnosis not present

## 2023-04-01 DIAGNOSIS — E785 Hyperlipidemia, unspecified: Secondary | ICD-10-CM | POA: Diagnosis not present

## 2023-04-01 DIAGNOSIS — K219 Gastro-esophageal reflux disease without esophagitis: Secondary | ICD-10-CM | POA: Diagnosis not present

## 2023-04-01 MED ORDER — FLUOXETINE HCL 10 MG PO CAPS: 10.0000 mg | ORAL_CAPSULE | Freq: Every day | ORAL | 5 refills | Status: DC

## 2023-04-01 MED ORDER — PANTOPRAZOLE SODIUM 40 MG PO TBEC: 40.0000 mg | DELAYED_RELEASE_TABLET | Freq: Every day | ORAL | 5 refills | Status: DC

## 2023-04-01 MED ORDER — GEMFIBROZIL 600 MG PO TABS: 600.0000 mg | ORAL_TABLET | Freq: Two times a day (BID) | ORAL | 5 refills | Status: DC

## 2023-04-01 MED ORDER — FLUOXETINE HCL 20 MG PO CAPS: 20.0000 mg | ORAL_CAPSULE | Freq: Every day | ORAL | 5 refills | Status: DC

## 2023-04-01 MED ORDER — LORATADINE 10 MG PO TABS: 10.0000 mg | ORAL_TABLET | Freq: Every day | ORAL | 1 refills | Status: DC

## 2023-04-01 MED ORDER — FLUTICASONE PROPIONATE 50 MCG/ACT NA SUSP: NASAL | 1 refills | Status: DC

## 2023-04-01 NOTE — Progress Notes (Signed)
Date:  04/01/2023   Name:  Regina Macdonald   DOB:  July 27, 1941   MRN:  [id number]   Chief Complaint: Allergic Rhinitis , Hyperlipidemia, Gastroesophageal Reflux, and Anxiety  Hyperlipidemia This is a chronic problem. The current episode started more than 1 year ago. The problem is controlled. Recent lipid tests were reviewed and are normal. She has no history of chronic renal disease, diabetes, hypothyroidism, obesity or nephrotic syndrome. There are no known factors aggravating her hyperlipidemia. Pertinent negatives include no chest pain, focal sensory loss, focal weakness, leg pain, myalgias or shortness of breath. Current antihyperlipidemic treatment includes fibric acid derivatives. The current treatment provides moderate improvement of lipids. There are no compliance problems.   Gastroesophageal Reflux She reports no abdominal pain, no chest pain, no coughing, no dysphagia, no heartburn, no nausea or no wheezing. The problem occurs frequently. The problem has been gradually improving. The symptoms are aggravated by certain foods. She has tried a PPI for the symptoms. The treatment provided moderate relief.  Anxiety Presents for follow-up visit. Patient reports no chest pain, decreased concentration, excessive worry, nausea, nervous/anxious behavior, palpitations or shortness of breath. The severity of symptoms is moderate. Nighttime awakenings: occasional.      Lab Results  Component Value Date   NA 141 07/21/2022   K 3.7 07/21/2022   CO2 24 07/21/2022   GLUCOSE 102 (H) 07/21/2022   BUN 15 07/21/2022   CREATININE 0.82 07/21/2022   CALCIUM 9.4 07/21/2022   EGFR 79 03/31/2022   GFRNONAA >60 07/21/2022   Lab Results  Component Value Date   CHOL 131 10/01/2022   HDL 36 (L) 10/01/2022   LDLCALC 75 10/01/2022   TRIG 107 10/01/2022   CHOLHDL 3.0 08/19/2017   Lab Results  Component Value Date   TSH 3.16 01/24/2021   Lab Results  Component Value Date   HGBA1C 5.7 (H)  12/02/2015   Lab Results  Component Value Date   WBC 8.0 02/10/2023   HGB 11.4 (L) 02/10/2023   HCT 34.6 (L) 02/10/2023   MCV 93.0 02/10/2023   PLT 294 02/10/2023   Lab Results  Component Value Date   ALT 16 07/21/2022   AST 28 07/21/2022   ALKPHOS 242 (H) 07/21/2022   BILITOT 0.8 07/21/2022   No results found for: "25OHVITD2", "25OHVITD3", "VD25OH"   Review of Systems  Respiratory:  Negative for cough, chest tightness, shortness of breath and wheezing.   Cardiovascular:  Negative for chest pain, palpitations and leg swelling.  Gastrointestinal:  Negative for abdominal pain, blood in stool, dysphagia, heartburn and nausea.  Endocrine: Negative for polydipsia and polyuria.  Musculoskeletal:  Negative for myalgias.  Neurological:  Negative for focal weakness.  Psychiatric/Behavioral:  Negative for decreased concentration. The patient is not nervous/anxious.     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   Anxiety 03/23/2018   Taking medication for chronic disease 03/23/2018   Other constipation 12/13/2017   Depression with anxiety 08/19/2017   Hyperlipidemia 08/19/2017   Gastroesophageal reflux disease without esophagitis 08/19/2017   Other chest pain 04/16/2015   COPD exacerbation (HCC) 04/16/2015   Sleep apnea 04/16/2015   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   Asthmatic bronchitis 04/16/2015   Abdominal pain, LLQ 03/29/2014   Rectal bleeding 03/29/2014  Arthritis, 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   Osteoarthrosis, unspecified whether generalized or localized, pelvic region and thigh 03/26/2014    Allergies  Allergen Reactions   Ciprofloxacin Other (See Comments)   Etodolac Other (See Comments)   Tramadol Other (See Comments)    Slurred  speech/ confusion   Sulfa Antibiotics Rash    Past Surgical History:  Procedure Laterality Date   ABDOMINAL HYSTERECTOMY     COLONOSCOPY  06/08/2013   Dr Bluford Kaufmann- small mouth diverticula   HIP SURGERY Left    KNEE SURGERY Bilateral    NASAL RECONSTRUCTION     x 2    Social History   Tobacco Use   Smoking status: Former   Smokeless tobacco: Never  Vaping Use   Vaping Use: Never used  Substance Use Topics   Alcohol use: Not Currently   Drug use: Never     Medication list has been reviewed and updated.  Current Meds  Medication Sig   acetaminophen (TYLENOL) 500 MG tablet Take 500 mg by mouth every 6 (six) hours as needed.   albuterol (VENTOLIN HFA) 108 (90 Base) MCG/ACT inhaler Inhale 2 puffs into the lungs every 6 (six) hours as needed for wheezing or shortness of breath.   aspirin 81 MG chewable tablet Chew 81 mg by mouth daily.   BREO ELLIPTA 100-25 MCG/ACT AEPB INHALE 1 PUFF ONCE DAILY   CALCIUM & MAGNESIUM CARBONATES PO Take 1 tablet by mouth daily.   EQ LORATADINE 10 MG tablet Take 1 tablet (10 mg total) by mouth daily.   EQUATE STOOL SOFTENER 100 MG capsule Take 1 capsule by mouth twice daily   FLUoxetine (PROZAC) 10 MG capsule Take 1 capsule (10 mg total) by mouth daily.   FLUoxetine (PROZAC) 20 MG capsule Take 1 capsule (20 mg total) by mouth daily.   fluticasone (FLONASE) 50 MCG/ACT nasal spray Use 2 sprays in each nostril daily   gemfibrozil (LOPID) 600 MG tablet Take 1 tablet (600 mg total) by mouth 2 (two) times daily.   ibuprofen (ADVIL) 200 MG tablet Take 400 mg by mouth every 6 (six) hours as needed for moderate pain.   Multiple Vitamins-Minerals (CENTRUM SILVER PO) Take 1 capsule by mouth daily.   pantoprazole (PROTONIX) 40 MG tablet Take 1 tablet (40 mg total) by mouth daily.   polyethylene glycol (MIRALAX) packet Take 17 g by mouth daily.       04/01/2023   11:01 AM 02/12/2023    3:15 PM 02/10/2023   11:06 AM 10/01/2022   10:55 AM  GAD 7 : Generalized  Anxiety Score  Nervous, Anxious, on Edge 0 0 0 0  Control/stop worrying 0 0 0 0  Worry too much - different things 0 0 0 0  Trouble relaxing 0 0 0 0  Restless 0 0 0 0  Easily annoyed or irritable 0 0 0 0  Afraid - awful might happen 0 0 0 0  Total GAD 7 Score 0 0 0 0  Anxiety Difficulty Not difficult at all Not difficult at all Not difficult at all Not difficult at all       04/01/2023   11:01 AM 02/12/2023    3:15 PM 02/10/2023   11:06 AM  Depression screen PHQ 2/9  Decreased Interest 0 0 0  Down, Depressed, Hopeless 0 0 0  PHQ - 2 Score 0 0 0  Altered sleeping 0 0 0  Tired, decreased energy 0 0 0  Change in appetite 0 0 0  Feeling bad or  failure about yourself  0 0 0  Trouble concentrating 0 0 0  Moving slowly or fidgety/restless 0 0 0  Suicidal thoughts 0 0 0  PHQ-9 Score 0 0 0  Difficult doing work/chores Not difficult at all Not difficult at all Not difficult at all    BP Readings from Last 3 Encounters:  04/01/23 130/88  03/29/23 130/80  02/12/23 138/70    Physical Exam Vitals and nursing note reviewed. Exam conducted with a chaperone present.  Constitutional:      General: She is not in acute distress.    Appearance: She is not diaphoretic.  HENT:     Head: Normocephalic and atraumatic.     Right Ear: Tympanic membrane, ear canal and external ear normal.     Left Ear: Tympanic membrane, ear canal and external ear normal.     Nose: Nose normal.     Mouth/Throat:     Mouth: Mucous membranes are moist.  Eyes:     General:        Right eye: No discharge.        Left eye: No discharge.     Conjunctiva/sclera: Conjunctivae normal.     Pupils: Pupils are equal, round, and reactive to light.  Neck:     Thyroid: No thyromegaly.     Vascular: No JVD.  Cardiovascular:     Rate and Rhythm: Normal rate and regular rhythm.     Heart sounds: Normal heart sounds. No murmur heard.    No friction rub. No gallop.  Pulmonary:     Effort: Pulmonary effort is normal.      Breath sounds: Normal breath sounds. No wheezing, rhonchi or rales.  Abdominal:     General: Bowel sounds are normal.     Palpations: Abdomen is soft. There is no mass.     Tenderness: There is no abdominal tenderness. There is no guarding.  Musculoskeletal:        General: Normal range of motion.     Cervical back: Normal range of motion and neck supple.  Lymphadenopathy:     Cervical: No cervical adenopathy.  Skin:    General: Skin is warm and dry.  Neurological:     Mental Status: She is alert.     Deep Tendon Reflexes: Reflexes are normal and symmetric.     Wt Readings from Last 3 Encounters:  04/01/23 178 lb (80.7 kg)  03/29/23 180 lb (81.6 kg)  02/12/23 180 lb (81.6 kg)    BP 130/88   Pulse 70   Ht 5\' 2"  (1.575 m)   Wt 178 lb (80.7 kg)   SpO2 96%   BMI 32.56 kg/m   Assessment and Plan:  1. Hyperlipidemia, unspecified hyperlipidemia type Chronic.  Controlled.  Stable.  Reviewed with lipid panel and pending results will likely continue gemfibrozil 600 mg twice a day. - gemfibrozil (LOPID) 600 MG tablet; Take 1 tablet (600 mg total) by mouth 2 (two) times daily.  Dispense: 60 tablet; Refill: 5 - Lipid Panel With LDL/HDL Ratio - Comprehensive Metabolic Panel (CMET)  2. Gastroesophageal reflux disease without esophagitis Chronic.  Controlled.  Stable.  Asymptomatic on current dosing of Protonix 40 mg once a day. - pantoprazole (PROTONIX) 40 MG tablet; Take 1 tablet (40 mg total) by mouth daily.  Dispense: 30 tablet; Refill: 5  3. Anxiety Chronic.  Controlled.  Stable.  PHQ is 0 GAD score is 0 continue fluoxetine 30 mg and dosings of 20 and 10 once a day. -  FLUoxetine (PROZAC) 10 MG capsule; Take 1 capsule (10 mg total) by mouth daily.  Dispense: 30 capsule; Refill: 5 - FLUoxetine (PROZAC) 20 MG capsule; Take 1 capsule (20 mg total) by mouth daily.  Dispense: 30 capsule; Refill: 5  4. Current mild episode of major depressive disorder, unspecified whether recurrent  (HCC) Chronic.  Controlled.  Stable.  PHQ 0 GAD score 0 continue current dosing of Prozac at 30 mg once a day. - FLUoxetine (PROZAC) 10 MG capsule; Take 1 capsule (10 mg total) by mouth daily.  Dispense: 30 capsule; Refill: 5 - FLUoxetine (PROZAC) 20 MG capsule; Take 1 capsule (20 mg total) by mouth daily.  Dispense: 30 capsule; Refill: 5  5. Allergic rhinitis Chronic.  Controlled.  Stable.  Continue Flonase 50 mg 2 sprays each nostril daily. - fluticasone (FLONASE) 50 MCG/ACT nasal spray; Use 2 sprays in each nostril daily  Dispense: 16 g; Refill: 1    Elizabeth Sauer, MD

## 2023-04-02 LAB — COMPREHENSIVE METABOLIC PANEL
ALT: 13 IU/L (ref 0–32)
AST: 23 IU/L (ref 0–40)
Albumin/Globulin Ratio: 2 (ref 1.2–2.2)
Albumin: 4.7 g/dL (ref 3.7–4.7)
Alkaline Phosphatase: 123 IU/L — ABNORMAL HIGH (ref 44–121)
BUN/Creatinine Ratio: 18 (ref 12–28)
BUN: 14 mg/dL (ref 8–27)
Bilirubin Total: 0.3 mg/dL (ref 0.0–1.2)
CO2: 25 mmol/L (ref 20–29)
Calcium: 9.3 mg/dL (ref 8.7–10.3)
Chloride: 103 mmol/L (ref 96–106)
Creatinine, Ser: 0.77 mg/dL (ref 0.57–1.00)
Globulin, Total: 2.4 g/dL (ref 1.5–4.5)
Glucose: 79 mg/dL (ref 70–99)
Potassium: 4.4 mmol/L (ref 3.5–5.2)
Sodium: 142 mmol/L (ref 134–144)
Total Protein: 7.1 g/dL (ref 6.0–8.5)
eGFR: 76 mL/min/{1.73_m2} (ref >59)

## 2023-04-02 LAB — LIPID PANEL WITH LDL/HDL RATIO
Cholesterol, Total: 143 mg/dL (ref 100–199)
HDL: 45 mg/dL (ref >39)
LDL Chol Calc (NIH): 81 mg/dL (ref 0–99)
LDL/HDL Ratio: 1.8 ratio (ref 0.0–3.2)
Triglycerides: 90 mg/dL (ref 0–149)
VLDL Cholesterol Cal: 17 mg/dL (ref 5–40)

## 2023-04-20 ENCOUNTER — Other Ambulatory Visit: Payer: Self-pay | Admitting: Physician Assistant

## 2023-04-20 DIAGNOSIS — J4489 Other specified chronic obstructive pulmonary disease: Secondary | ICD-10-CM

## 2023-04-25 ENCOUNTER — Other Ambulatory Visit: Payer: Self-pay | Admitting: Internal Medicine

## 2023-04-25 DIAGNOSIS — J4489 Other specified chronic obstructive pulmonary disease: Secondary | ICD-10-CM

## 2023-05-03 ENCOUNTER — Other Ambulatory Visit: Payer: Self-pay | Admitting: Family Medicine

## 2023-05-03 DIAGNOSIS — Z1231 Encounter for screening mammogram for malignant neoplasm of breast: Secondary | ICD-10-CM

## 2023-05-04 ENCOUNTER — Other Ambulatory Visit: Payer: Self-pay | Admitting: Family Medicine

## 2023-05-04 DIAGNOSIS — K219 Gastro-esophageal reflux disease without esophagitis: Secondary | ICD-10-CM

## 2023-05-04 NOTE — Telephone Encounter (Signed)
Called [facility] and pt has refills. Advised will get refill ready for pt.   Requested Prescriptions  Refused Prescriptions Disp Refills   pantoprazole (PROTONIX) 40 MG tablet [Pharmacy Med Name: PANTOPRAZOLE 40MG  TABLETS] 90 tablet     Sig: TAKE 1 TABLET BY MOUTH DAILY     Gastroenterology: Proton Pump Inhibitors Passed - 05/04/2023  9:45 AM      Passed - Valid encounter within last 12 months    Recent Outpatient Visits           1 month ago Hyperlipidemia, unspecified hyperlipidemia type   [facility]   2 months ago COPD with acute bronchitis [facility])   [facility]   2 months ago SOB (shortness of breath) on exertion   [facility]   7 months ago Anxiety   [facility]   9 months ago Expressive dysphasia   [facility]       Future Appointments             In 5 months Duanne Limerick, MD [facility] at [facility]

## 2023-05-11 DIAGNOSIS — G4733 Obstructive sleep apnea (adult) (pediatric): Secondary | ICD-10-CM | POA: Diagnosis not present

## 2023-05-24 ENCOUNTER — Ambulatory Visit: Payer: Medicare Other

## 2023-06-03 ENCOUNTER — Other Ambulatory Visit: Payer: Self-pay | Admitting: Family Medicine

## 2023-06-03 DIAGNOSIS — J309 Allergic rhinitis, unspecified: Secondary | ICD-10-CM

## 2023-06-19 ENCOUNTER — Other Ambulatory Visit: Payer: Self-pay | Admitting: Family Medicine

## 2023-06-19 DIAGNOSIS — E785 Hyperlipidemia, unspecified: Secondary | ICD-10-CM

## 2023-06-22 ENCOUNTER — Ambulatory Visit
Admission: RE | Admit: 2023-06-22 | Discharge: 2023-06-22 | Disposition: A | Discharge status: Home / Self Care | Payer: Medicare Other | Source: Ambulatory Visit | Attending: Family Medicine | Admitting: Family Medicine

## 2023-06-22 DIAGNOSIS — Z1231 Encounter for screening mammogram for malignant neoplasm of breast: Secondary | ICD-10-CM | POA: Diagnosis not present

## 2023-06-29 ENCOUNTER — Other Ambulatory Visit: Payer: Self-pay | Admitting: Family Medicine

## 2023-06-29 DIAGNOSIS — J309 Allergic rhinitis, unspecified: Secondary | ICD-10-CM

## 2023-06-29 NOTE — Telephone Encounter (Signed)
Patient call x 2, caller picked up but did not say anything, then hung up. Called a 3rd time, no answer, mailbox is not set up. [facility] called and spoke to [place], Pensions consultant about the refill(s) medications requested. She says she has the pantoprazole, loratidine, and fluoxetin on file and will be ready for pickup on Thursday after 1130. She says the fluticasone was canceled out on 06/03/23 so will need a new Rx sent. She says the patient picked up gemfibrozil on 06/21/23, so it's too early to refill.

## 2023-06-29 NOTE — Telephone Encounter (Signed)
Medication Refill - Medication: pantoprazole (PROTONIX) 40 MG tablet, loratadine (EQ LORATADINE) 10 MG tablet, gemfibrozil (LOPID) 600 MG tablet, fluticasone (FLONASE) 50 MCG/ACT nasal spray, FLUoxetine (PROZAC) 20 MG capsule and   Has the patient contacted their pharmacy? No.   Preferred Pharmacy (with phone number or street name):  [id number] - [place], [place] - [address] AT [facility] OF [address] [facility] Phone: [phone]  Fax: [phone]     Has the patient been seen for an appointment in the last year OR does the patient have an upcoming appointment? Yes.    Agent: Please be advised that RX refills may take up to 3 business days. We ask that you follow-up with your pharmacy.

## 2023-07-01 MED ORDER — FLUTICASONE PROPIONATE 50 MCG/ACT NA SUSP
NASAL | 0 refills | Status: DC
Start: 2023-07-01 — End: 2023-09-28

## 2023-07-01 NOTE — Telephone Encounter (Signed)
Requested Prescriptions  Pending Prescriptions Disp Refills   fluticasone (FLONASE) 50 MCG/ACT nasal spray 48 g 0    Sig: SHAKE LIQUID AND USE 2 SPRAYS IN EACH NOSTRIL DAILY     Ear, Nose, and Throat: Nasal Preparations - Corticosteroids Passed - 06/29/2023  2:26 PM      Passed - Valid encounter within last 12 months    Recent Outpatient Visits           3 months ago Hyperlipidemia, unspecified hyperlipidemia type   [place] [facility]   4 months ago COPD with acute bronchitis [facility])   [place] [facility]   4 months ago SOB (shortness of breath) on exertion   [facility]   9 months ago Anxiety   [facility] at [facility]   11 months ago Expressive dysphasia   [facility] & [facility] at [facility]       Future Appointments             In 3 months Duanne Limerick, MD [facility] Primary Care & Sports Medicine at [facility], [facility]

## 2023-07-05 ENCOUNTER — Telehealth: Payer: Self-pay | Admitting: Family Medicine

## 2023-07-05 DIAGNOSIS — E785 Hyperlipidemia, unspecified: Secondary | ICD-10-CM

## 2023-07-05 DIAGNOSIS — J4489 Other specified chronic obstructive pulmonary disease: Secondary | ICD-10-CM

## 2023-07-05 NOTE — Telephone Encounter (Addendum)
[facility] called and spoke to [place], Pensions consultant about the refill(s) albuterol and gemfibrozil requested. Advised albuterol was sent on 04/20/23 3 refills and gemfibrozil was sent on 06/21/23 #180/0 refills. She says the are refills available for albuterol on file and gemfibrozil wasn't received on 06/21/23, but on 04/01/23 #60/5 refills are on file for the patient to have refilled. She says she's able to refill gemfibrozil for 90 days and there are still refills on file. She says they will be ready for pickup on Wednesday after 1130.

## 2023-07-05 NOTE — Telephone Encounter (Signed)
Medication Refill - Medication:   Albuterol inhaler Gemfibrozil 600 mg twice a day   Has the patient contacted their pharmacy? Yes.   (Agent: If no, request that the patient contact the pharmacy for the refill. If patient does not wish to contact the pharmacy document the reason why and proceed with request.) (Agent: If yes, when and what did the pharmacy advise?)  Preferred Pharmacy (with phone number or street name): [facility] Has the patient been seen for an appointment in the last year OR does the patient have an upcoming appointment? Yes.    Agent: Please be advised that RX refills may take up to 3 business days. We ask that you follow-up with your pharmacy.

## 2023-07-06 ENCOUNTER — Ambulatory Visit: Payer: Medicare Other | Admitting: Internal Medicine

## 2023-07-06 ENCOUNTER — Encounter: Payer: Self-pay | Admitting: Internal Medicine

## 2023-07-06 VITALS — BP 138/74 | HR 80 | Temp 97.8°F | Resp 16 | Ht 62.0 in | Wt 183.0 lb

## 2023-07-06 DIAGNOSIS — J986 Disorders of diaphragm: Secondary | ICD-10-CM | POA: Diagnosis not present

## 2023-07-06 DIAGNOSIS — G4733 Obstructive sleep apnea (adult) (pediatric): Secondary | ICD-10-CM | POA: Diagnosis not present

## 2023-07-06 DIAGNOSIS — J4489 Other specified chronic obstructive pulmonary disease: Secondary | ICD-10-CM | POA: Diagnosis not present

## 2023-07-06 NOTE — Progress Notes (Signed)
[facility] [address] [place], [place] [address]  Pulmonary Sleep Medicine   Office Visit Note  Patient Name: Regina Macdonald DOB: 08-21-1941 MRN [id number]  Date of Service: 07/06/2023  Complaints/HPI: She has been doing well overall. She states she was dropped by her DME provider and will need to swithc to a differnet company. She is in need of a prescription for a cpap machine. Patient states she has had no other issues. She denies having cough or congestion. She had a PSG and titration done in 2021 which will be sent to her new provider  Office Spirometry Results:     ROS  General: (-) fever, (-) chills, (-) night sweats, (-) weakness Skin: (-) rashes, (-) itching,. Eyes: (-) visual changes, (-) redness, (-) itching. Nose and Sinuses: (-) nasal stuffiness or itchiness, (-) postnasal drip, (-) nosebleeds, (-) sinus trouble. Mouth and Throat: (-) sore throat, (-) hoarseness. Neck: (-) swollen glands, (-) enlarged thyroid, (-) neck pain. Respiratory: - cough, (-) bloody sputum, - shortness of breath, - wheezing. Cardiovascular: - ankle swelling, (-) chest pain. Lymphatic: (-) lymph node enlargement. Neurologic: (-) numbness, (-) tingling. Psychiatric: (-) anxiety, (-) depression   Current Medication: Outpatient Encounter Medications as of 07/06/2023  Medication Sig   acetaminophen (TYLENOL) 500 MG tablet Take 500 mg by mouth every 6 (six) hours as needed.   albuterol (VENTOLIN HFA) 108 (90 Base) MCG/ACT inhaler INHALE 2 PUFFS INTO THE LUNGS EVERY 6 HOURS AS NEEDED FOR WHEEZING OR SHORTNESS OF BREATH   aspirin 81 MG chewable tablet Chew 81 mg by mouth daily.   CALCIUM & MAGNESIUM CARBONATES PO Take 1 tablet by mouth daily.   EQUATE STOOL SOFTENER 100 MG capsule Take 1 capsule by mouth twice daily   FLUoxetine (PROZAC) 10 MG capsule Take 1 capsule (10 mg total) by mouth daily.   FLUoxetine (PROZAC) 20 MG capsule Take 1 capsule (20 mg total) by mouth daily.    fluticasone (FLONASE) 50 MCG/ACT nasal spray SHAKE LIQUID AND USE 2 SPRAYS IN EACH NOSTRIL DAILY   fluticasone furoate-vilanterol (BREO ELLIPTA) 100-25 MCG/ACT AEPB USE 1 INHALATION BY MOUTH ONCE DAILY   gemfibrozil (LOPID) 600 MG tablet TAKE 1 TABLET BY MOUTH TWICE DAILY   ibuprofen (ADVIL) 200 MG tablet Take 400 mg by mouth every 6 (six) hours as needed for moderate pain.   loratadine (EQ LORATADINE) 10 MG tablet Take 1 tablet (10 mg total) by mouth daily.   Multiple Vitamins-Minerals (CENTRUM SILVER PO) Take 1 capsule by mouth daily.   pantoprazole (PROTONIX) 40 MG tablet Take 1 tablet (40 mg total) by mouth daily.   polyethylene glycol (MIRALAX) packet Take 17 g by mouth daily.   No facility-administered encounter medications on file as of 07/06/2023.    Surgical History: Past Surgical History:  Procedure Laterality Date   ABDOMINAL HYSTERECTOMY     COLONOSCOPY  06/08/2013   Dr Bluford Kaufmann- small mouth diverticula   HIP SURGERY Left    KNEE SURGERY Bilateral    NASAL RECONSTRUCTION     x 2    Medical History: Past Medical History:  Diagnosis Date   Anxiety    Asthma    Cancer (HCC) cervical-1973   COPD (chronic obstructive pulmonary disease) (HCC)    Depression    Diverticula, colon    Diverticulosis    GERD (gastroesophageal reflux disease)    Sleep apnea     Family History: Family History  Problem Relation Age of Onset   Cirrhosis Father  Coronary artery disease Mother    Breast cancer Neg Hx     Social History: Social History   Socioeconomic History   Marital status: Divorced    Spouse name: Not on file   Number of children: 3   Years of education: 12   Highest education level: High school graduate  Occupational History   Occupation: retired  Tobacco Use   Smoking status: Former   Smokeless tobacco: Never  Building services engineer status: Never Used  Substance and Sexual Activity   Alcohol use: Not Currently   Drug use: Never   Sexual activity: Not Currently     Partners: Male    Birth control/protection: Post-menopausal  Other Topics Concern   Not on file  Social History Narrative   Pt lives with her daughter   Social Determinants of Health   Financial Resource Strain: Low Risk  (10/30/2022)   Overall Financial Resource Strain (CARDIA)    Difficulty of Paying Living Expenses: Not hard at all  Food Insecurity: No Food Insecurity (10/30/2022)   Hunger Vital Sign    Worried About Running Out of Food in the Last Year: Never true    Ran Out of Food in the Last Year: Never true  Transportation Needs: No Transportation Needs (10/30/2022)   PRAPARE - Administrator, Civil Service (Medical): No    Lack of Transportation (Non-Medical): No  Physical Activity: Insufficiently Active (10/30/2022)   Exercise Vital Sign    Days of Exercise per Week: 7 days    Minutes of Exercise per Session: 10 min  Stress: No Stress Concern Present (10/30/2022)   [employer] of Occupational Health - Occupational Stress Questionnaire    Feeling of Stress : Only a little  Social Connections: Socially Isolated (10/30/2022)   Social Connection and Isolation Panel [NHANES]    Frequency of Communication with Friends and Family: More than three times a week    Frequency of Social Gatherings with Friends and Family: Three times a week    Attends Religious Services: Never    Active Member of Clubs or Organizations: No    Attends Banker Meetings: Never    Marital Status: Divorced  Catering manager Violence: Not At Risk (10/30/2022)   Humiliation, Afraid, Rape, and Kick questionnaire    Fear of Current or Ex-Partner: No    Emotionally Abused: No    Physically Abused: No    Sexually Abused: No    Vital Signs: Blood pressure 138/74, pulse 80, temperature 97.8 F (36.6 C), resp. rate 16, height 5\' 2"  (1.575 m), weight 183 lb (83 kg), SpO2 96%.  Examination: General Appearance: The patient is well-developed, well-nourished, and in no  distress. Skin: Gross inspection of skin unremarkable. Head: normocephalic, no gross deformities. Eyes: no gross deformities noted. ENT: ears appear grossly normal no exudates. Neck: Supple. No thyromegaly. No LAD. Respiratory: no rhonchi noted. Cardiovascular: Normal S1 and S2 without murmur or rub. Extremities: No cyanosis. pulses are equal. Neurologic: Alert and oriented. No involuntary movements.  LABS: No results found for this or any previous visit (from the past 2160 hour(s)).  Radiology: MM 3D SCREENING MAMMOGRAM BILATERAL BREAST  Result Date: 06/23/2023 CLINICAL DATA:  Screening. EXAM: DIGITAL SCREENING BILATERAL MAMMOGRAM WITH TOMOSYNTHESIS AND CAD TECHNIQUE: Bilateral screening digital craniocaudal and mediolateral oblique mammograms were obtained. Bilateral screening digital breast tomosynthesis was performed. The images were evaluated with computer-aided detection. COMPARISON:  Previous exam(s). ACR Breast Density Category b: There are scattered areas of fibroglandular  density. FINDINGS: There are no findings suspicious for malignancy. IMPRESSION: No mammographic evidence of malignancy. A result letter of this screening mammogram will be mailed directly to the patient. RECOMMENDATION: Screening mammogram in one year. (Code:SM-B-01Y) BI-RADS CATEGORY  1: Negative. Electronically Signed   By: Sherian Rein M.D.   On: 06/23/2023 13:25    No results found.  MM 3D SCREENING MAMMOGRAM BILATERAL BREAST  Result Date: 06/23/2023 CLINICAL DATA:  Screening. EXAM: DIGITAL SCREENING BILATERAL MAMMOGRAM WITH TOMOSYNTHESIS AND CAD TECHNIQUE: Bilateral screening digital craniocaudal and mediolateral oblique mammograms were obtained. Bilateral screening digital breast tomosynthesis was performed. The images were evaluated with computer-aided detection. COMPARISON:  Previous exam(s). ACR Breast Density Category b: There are scattered areas of fibroglandular density. FINDINGS: There are no findings  suspicious for malignancy. IMPRESSION: No mammographic evidence of malignancy. A result letter of this screening mammogram will be mailed directly to the patient. RECOMMENDATION: Screening mammogram in one year. (Code:SM-B-01Y) BI-RADS CATEGORY  1: Negative. Electronically Signed   By: Sherian Rein M.D.   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 Obstructive chronic bronchitis without exacerbation Patient is at baseline we will continue on current inhaler regimen.  She appears to be stable on the settings.  2. OSA on CPAP CPAP to be resumed will go ahead and monitor her lung closely at the response to treatment.  Prescription written for the DME provider. - For home use only DME continuous positive airway pressure (CPAP)  3. Diaphragm dysfunction I think she will benefit from the PAP therapy with the diaphragmatic dysfunction.  General Counseling: I have discussed the findings of the evaluation and examination with [place].  I have also discussed any further diagnostic evaluation thatmay be needed or ordered  today. Teneal verbalizes understanding of the findings of todays visit. We also reviewed her medications today and discussed drug interactions and side effects including but not limited excessive drowsiness and altered mental states. We also discussed that there is always a risk not just to her but also people around her. she has been encouraged to call the office with any questions or concerns that should arise related to todays visit.  No orders of the defined types were placed in this encounter.    Time spent: 47  I have personally obtained a history, examined the patient, evaluated laboratory and imaging results, formulated the assessment and plan and placed orders.    Yevonne Pax, MD [facility] Pulmonary and Critical Care Sleep medicine

## 2023-07-06 NOTE — Patient Instructions (Signed)

## 2023-07-07 ENCOUNTER — Telehealth: Payer: Self-pay | Admitting: Internal Medicine

## 2023-07-07 ENCOUNTER — Other Ambulatory Visit: Payer: Self-pay | Admitting: Family Medicine

## 2023-07-07 DIAGNOSIS — E785 Hyperlipidemia, unspecified: Secondary | ICD-10-CM

## 2023-07-07 NOTE — Telephone Encounter (Signed)
Notified Verlon Au and Lurena Joiner with [facility] of cpap supply order-Toni

## 2023-07-08 NOTE — Telephone Encounter (Signed)
Requested Prescriptions  Pending Prescriptions Disp Refills   gemfibrozil (LOPID) 600 MG tablet [Pharmacy Med Name: GEMFIBROZIL 600MG  TABLETS] 180 tablet 0    Sig: TAKE 1 TABLET BY MOUTH TWICE DAILY     Cardiovascular:  Antilipid - Fibric Acid Derivatives Failed - 07/07/2023  3:24 AM      Failed - HGB in normal range and within 360 days    Hemoglobin  Date Value Ref Range Status  02/10/2023 11.4 (L) 12.0 - 15.0 g/dL Final  96/02/5408 81.1 11.1 - 15.9 g/dL Final         Failed - HCT in normal range and within 360 days    HCT  Date Value Ref Range Status  02/10/2023 34.6 (L) 36.0 - 46.0 % Final   Hematocrit  Date Value Ref Range Status  12/20/2019 34.3 34.0 - 46.6 % Final         Failed - Lipid Panel in normal range within the last 12 months    Cholesterol, Total  Date Value Ref Range Status  04/01/2023 143 100 - 199 mg/dL Final   LDL Chol Calc (NIH)  Date Value Ref Range Status  04/01/2023 81 0 - 99 mg/dL Final   HDL  Date Value Ref Range Status  04/01/2023 45 >39 mg/dL Final   Triglycerides  Date Value Ref Range Status  04/01/2023 90 0 - 149 mg/dL Final         Passed - ALT in normal range and within 360 days    ALT  Date Value Ref Range Status  04/01/2023 13 0 - 32 IU/L Final         Passed - AST in normal range and within 360 days    AST  Date Value Ref Range Status  04/01/2023 23 0 - 40 IU/L Final         Passed - Cr in normal range and within 360 days    Creatinine  Date Value Ref Range Status  12/31/2013 0.75 0.60 - 1.30 mg/dL Final   Creatinine, Ser  Date Value Ref Range Status  04/01/2023 0.77 0.57 - 1.00 mg/dL Final         Passed - PLT in normal range and within 360 days    Platelets  Date Value Ref Range Status  02/10/2023 294 150 - 400 K/uL Final  12/20/2019 316 150 - 450 x10E3/uL Final         Passed - WBC in normal range and within 360 days    WBC  Date Value Ref Range Status  02/10/2023 8.0 4.0 - 10.5 K/uL Final         Passed  - eGFR is 30 or above and within 360 days    EGFR (African American)  Date Value Ref Range Status  12/31/2013 >60  Final   GFR calc Af Amer  Date Value Ref Range Status  12/20/2019 98 >59 mL/min/1.73 Final   EGFR (Non-African Amer.)  Date Value Ref Range Status  12/31/2013 >60  Final    Comment:    eGFR values <49mL/min/1.73 m2 may be an indication of chronic kidney disease (CKD). Calculated eGFR is useful in patients with stable renal function. The eGFR calculation will not be reliable in acutely ill patients when serum creatinine is changing rapidly. It is not useful in  patients on dialysis. The eGFR calculation may not be applicable to patients at the low and high extremes of body sizes, pregnant women, and vegetarians.    GFR, Estimated  Date  Value Ref Range Status  07/21/2022 >60 >60 mL/min Final    Comment:    (NOTE) Calculated using the CKD-EPI Creatinine Equation (2021)    eGFR  Date Value Ref Range Status  04/01/2023 76 >59 mL/min/1.73 Final         Passed - Valid encounter within last 12 months    Recent Outpatient Visits           3 months ago Hyperlipidemia, unspecified hyperlipidemia type   [place] [facility]   4 months ago COPD with acute bronchitis [facility])    [facility]   4 months ago SOB (shortness of breath) on exertion   [facility]   9 months ago Anxiety   [facility]   11 months ago Expressive dysphasia   [facility] Primary Care & Sports Medicine [facility]       Future Appointments             In 2 months Duanne Limerick, MD [facility] Primary Care & Sports Medicine at [facility], [facility]

## 2023-07-09 DIAGNOSIS — G4733 Obstructive sleep apnea (adult) (pediatric): Secondary | ICD-10-CM | POA: Diagnosis not present

## 2023-07-13 ENCOUNTER — Telehealth: Payer: Self-pay | Admitting: Family Medicine

## 2023-07-13 NOTE — Telephone Encounter (Signed)
Copied from CRM [id number]. Topic: General - Other >> Jul 13, 2023  4:45 PM Epimenio Foot F wrote: Catelyn a Np with [employer] is calling in because pt's bp was 170/80 and 170/78 today. Catelyn felt like her pressure was too high and wanted to report it. She says he prozac has been increased and Catelyn isn't sure it's working for the pt.

## 2023-07-14 ENCOUNTER — Telehealth: Payer: Self-pay

## 2023-07-14 NOTE — Telephone Encounter (Signed)
Called pt after receiving message from a house call nurse that her bp was elevated. Couldn't reach pt so I called Regina Macdonald/ son and scheduled appt for tomorrow for a bp check

## 2023-07-15 ENCOUNTER — Encounter: Payer: Self-pay | Admitting: Family Medicine

## 2023-07-15 ENCOUNTER — Ambulatory Visit: Payer: Medicare Other | Admitting: Family Medicine

## 2023-07-15 VITALS — BP 124/70 | HR 66 | Ht 62.0 in | Wt 183.0 lb

## 2023-07-15 DIAGNOSIS — R030 Elevated blood-pressure reading, without diagnosis of hypertension: Secondary | ICD-10-CM

## 2023-07-15 NOTE — Progress Notes (Signed)
Date:  07/15/2023   Name:  Regina Macdonald   DOB:  [birth year]   MRN:  [id number]   Chief Complaint: Hypertension  Hypertension This is a chronic problem. The current episode started more than 1 year ago. The problem has been waxing and waning since onset. The problem is controlled. Pertinent negatives include no blurred vision, chest pain, orthopnea, palpitations, peripheral edema, PND, shortness of breath or sweats. There are no associated agents to hypertension. Past treatments include nothing. There is no history of angina, kidney disease, CAD/MI, CVA, heart failure, left ventricular hypertrophy, PVD or retinopathy. There is no history of chronic renal disease, a hypertension causing med or renovascular disease.    Lab Results  Component Value Date   NA 142 04/01/2023   K 4.4 04/01/2023   CO2 25 04/01/2023   GLUCOSE 79 04/01/2023   BUN 14 04/01/2023   CREATININE 0.77 04/01/2023   CALCIUM 9.3 04/01/2023   EGFR 76 04/01/2023   GFRNONAA >60 07/21/2022   Lab Results  Component Value Date   CHOL 143 04/01/2023   HDL 45 04/01/2023   LDLCALC 81 04/01/2023   TRIG 90 04/01/2023   CHOLHDL 3.0 08/19/2017   Lab Results  Component Value Date   TSH 3.16 01/24/2021   Lab Results  Component Value Date   HGBA1C 5.7 (H) 12/02/2015   Lab Results  Component Value Date   WBC 8.0 02/10/2023   HGB 11.4 (L) 02/10/2023   HCT 34.6 (L) 02/10/2023   MCV 93.0 02/10/2023   PLT 294 02/10/2023   Lab Results  Component Value Date   ALT 13 04/01/2023   AST 23 04/01/2023   ALKPHOS 123 (H) 04/01/2023   BILITOT 0.3 04/01/2023   No results found for: "25OHVITD2", "25OHVITD3", "VD25OH"   Review of Systems  Eyes:  Negative for blurred vision.  Respiratory:  Negative for cough, chest tightness, shortness of breath and wheezing.   Cardiovascular:  Negative for chest pain, palpitations, orthopnea and PND.  Gastrointestinal:  Negative for abdominal distention and abdominal pain.  Endocrine:  Negative for polydipsia and polyuria.    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   Anxiety 03/23/2018   Taking medication for chronic disease 03/23/2018   Other constipation 12/13/2017   Depression with anxiety 08/19/2017   Hyperlipidemia 08/19/2017   Gastroesophageal reflux disease without esophagitis 08/19/2017   Other chest pain 04/16/2015   COPD exacerbation (HCC) 04/16/2015   Sleep apnea 04/16/2015   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   Asthmatic bronchitis 04/16/2015   Abdominal pain, LLQ 03/29/2014   Rectal bleeding 03/29/2014   Arthritis, 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   Osteoarthrosis, unspecified whether generalized or localized, pelvic region and thigh 03/26/2014    Allergies  Allergen Reactions   Ciprofloxacin Other (See Comments)   Etodolac Other (See Comments)   Tramadol Other (See Comments)    Slurred speech/ confusion   Sulfa Antibiotics Rash    Past Surgical History:  Procedure Laterality Date   ABDOMINAL HYSTERECTOMY     COLONOSCOPY  06/08/2013   Dr Bluford Kaufmann- small mouth diverticula   HIP SURGERY Left    KNEE SURGERY Bilateral    NASAL RECONSTRUCTION     x 2    Social History   Tobacco Use   Smoking status: Former  Smokeless tobacco: Never  Vaping Use   Vaping status: Never Used  Substance Use Topics   Alcohol use: Not Currently   Drug use: Never     Medication list has been reviewed and updated.  Current Meds  Medication Sig   acetaminophen (TYLENOL) 500 MG tablet Take 500 mg by mouth every 6 (six) hours as needed.   albuterol (VENTOLIN HFA) 108 (90 Base) MCG/ACT inhaler INHALE 2 PUFFS INTO THE LUNGS EVERY 6 HOURS AS NEEDED FOR WHEEZING OR SHORTNESS OF BREATH   aspirin 81 MG chewable tablet Chew 81 mg by mouth  daily.   CALCIUM & MAGNESIUM CARBONATES PO Take 1 tablet by mouth daily.   EQUATE STOOL SOFTENER 100 MG capsule Take 1 capsule by mouth twice daily   FLUoxetine (PROZAC) 10 MG capsule Take 1 capsule (10 mg total) by mouth daily.   FLUoxetine (PROZAC) 20 MG capsule Take 1 capsule (20 mg total) by mouth daily.   fluticasone (FLONASE) 50 MCG/ACT nasal spray SHAKE LIQUID AND USE 2 SPRAYS IN EACH NOSTRIL DAILY   fluticasone furoate-vilanterol (BREO ELLIPTA) 100-25 MCG/ACT AEPB USE 1 INHALATION BY MOUTH ONCE DAILY   gemfibrozil (LOPID) 600 MG tablet TAKE 1 TABLET BY MOUTH TWICE DAILY   ibuprofen (ADVIL) 200 MG tablet Take 400 mg by mouth every 6 (six) hours as needed for moderate pain.   loratadine (EQ LORATADINE) 10 MG tablet Take 1 tablet (10 mg total) by mouth daily.   Multiple Vitamins-Minerals (CENTRUM SILVER PO) Take 1 capsule by mouth daily.   pantoprazole (PROTONIX) 40 MG tablet Take 1 tablet (40 mg total) by mouth daily.   polyethylene glycol (MIRALAX) packet Take 17 g by mouth daily.       07/15/2023    3:05 PM 04/01/2023   11:01 AM 02/12/2023    3:15 PM 02/10/2023   11:06 AM  GAD 7 : Generalized Anxiety Score  Nervous, Anxious, on Edge 0 0 0 0  Control/stop worrying 0 0 0 0  Worry too much - different things 0 0 0 0  Trouble relaxing 0 0 0 0  Restless 0 0 0 0  Easily annoyed or irritable 0 0 0 0  Afraid - awful might happen 0 0 0 0  Total GAD 7 Score 0 0 0 0  Anxiety Difficulty Not difficult at all Not difficult at all Not difficult at all Not difficult at all       07/15/2023    3:05 PM 04/01/2023   11:01 AM 02/12/2023    3:15 PM  Depression screen PHQ 2/9  Decreased Interest 0 0 0  Down, Depressed, Hopeless 0 0 0  PHQ - 2 Score 0 0 0  Altered sleeping 0 0 0  Tired, decreased energy 0 0 0  Change in appetite 0 0 0  Feeling bad or failure about yourself  0 0 0  Trouble concentrating 0 0 0  Moving slowly or fidgety/restless 0 0 0  Suicidal thoughts 0 0 0  PHQ-9 Score 0 0  0  Difficult doing work/chores Not difficult at all Not difficult at all Not difficult at all    BP Readings from Last 3 Encounters:  07/15/23 124/70  07/06/23 138/74  04/01/23 130/88    Physical Exam Vitals and nursing note reviewed.  Constitutional:      General: She is not in acute distress.    Appearance: She is not ill-appearing, toxic-appearing or diaphoretic.  HENT:     Right Ear: Tympanic membrane normal.  Left Ear: Tympanic membrane normal.     Nose: Nose normal.  Eyes:     Conjunctiva/sclera: Conjunctivae normal.  Cardiovascular:     Rate and Rhythm: Normal rate.     Pulses: Normal pulses.     Heart sounds: No murmur heard.    No friction rub. No gallop.  Pulmonary:     Effort: No respiratory distress.     Breath sounds: No wheezing, rhonchi or rales.  Abdominal:     General: Abdomen is flat.     Tenderness: There is no abdominal tenderness. There is no guarding.  Neurological:     Mental Status: She is alert.     Wt Readings from Last 3 Encounters:  07/15/23 183 lb (83 kg)  07/06/23 183 lb (83 kg)  04/01/23 178 lb (80.7 kg)    BP 124/70   Pulse 66   Ht 5\' 2"  (1.575 m)   Wt 183 lb (83 kg)   SpO2 98%   BMI 33.47 kg/m   Assessment and Plan:  1. Elevated blood pressure, situational New onset.  Episodic.  Stable.  Currently stable.  With normal blood pressure reading of 124/70.  Patient had a new person come to the house and was asking questions that the patient got some anxiety and developed blood pressure concerns.  Recheck today is back to her normal range and patient has been instructed to recheck on her own after someone leaves to see if it has returned to normal and if it has not we will be glad to recheck on an as-needed basis. Elizabeth Sauer, MD

## 2023-07-20 ENCOUNTER — Telehealth: Payer: Self-pay | Admitting: Internal Medicine

## 2023-07-20 NOTE — Telephone Encounter (Signed)
PAP supply order signed & faxed back to [facility]; [phone]. Scanned-Toni

## 2023-08-25 ENCOUNTER — Other Ambulatory Visit: Payer: Self-pay | Admitting: Internal Medicine

## 2023-08-25 DIAGNOSIS — J4489 Other specified chronic obstructive pulmonary disease: Secondary | ICD-10-CM

## 2023-09-02 IMAGING — CR DG ANKLE COMPLETE 3+V*R*
3 series · 3 of 3 positions shown · non-contrast
Comparison: None

CLINICAL DATA: Recent fall with right ankle pain, initial encounter

EXAM:
RIGHT ANKLE - COMPLETE 3+ VIEW

[ankle ap]
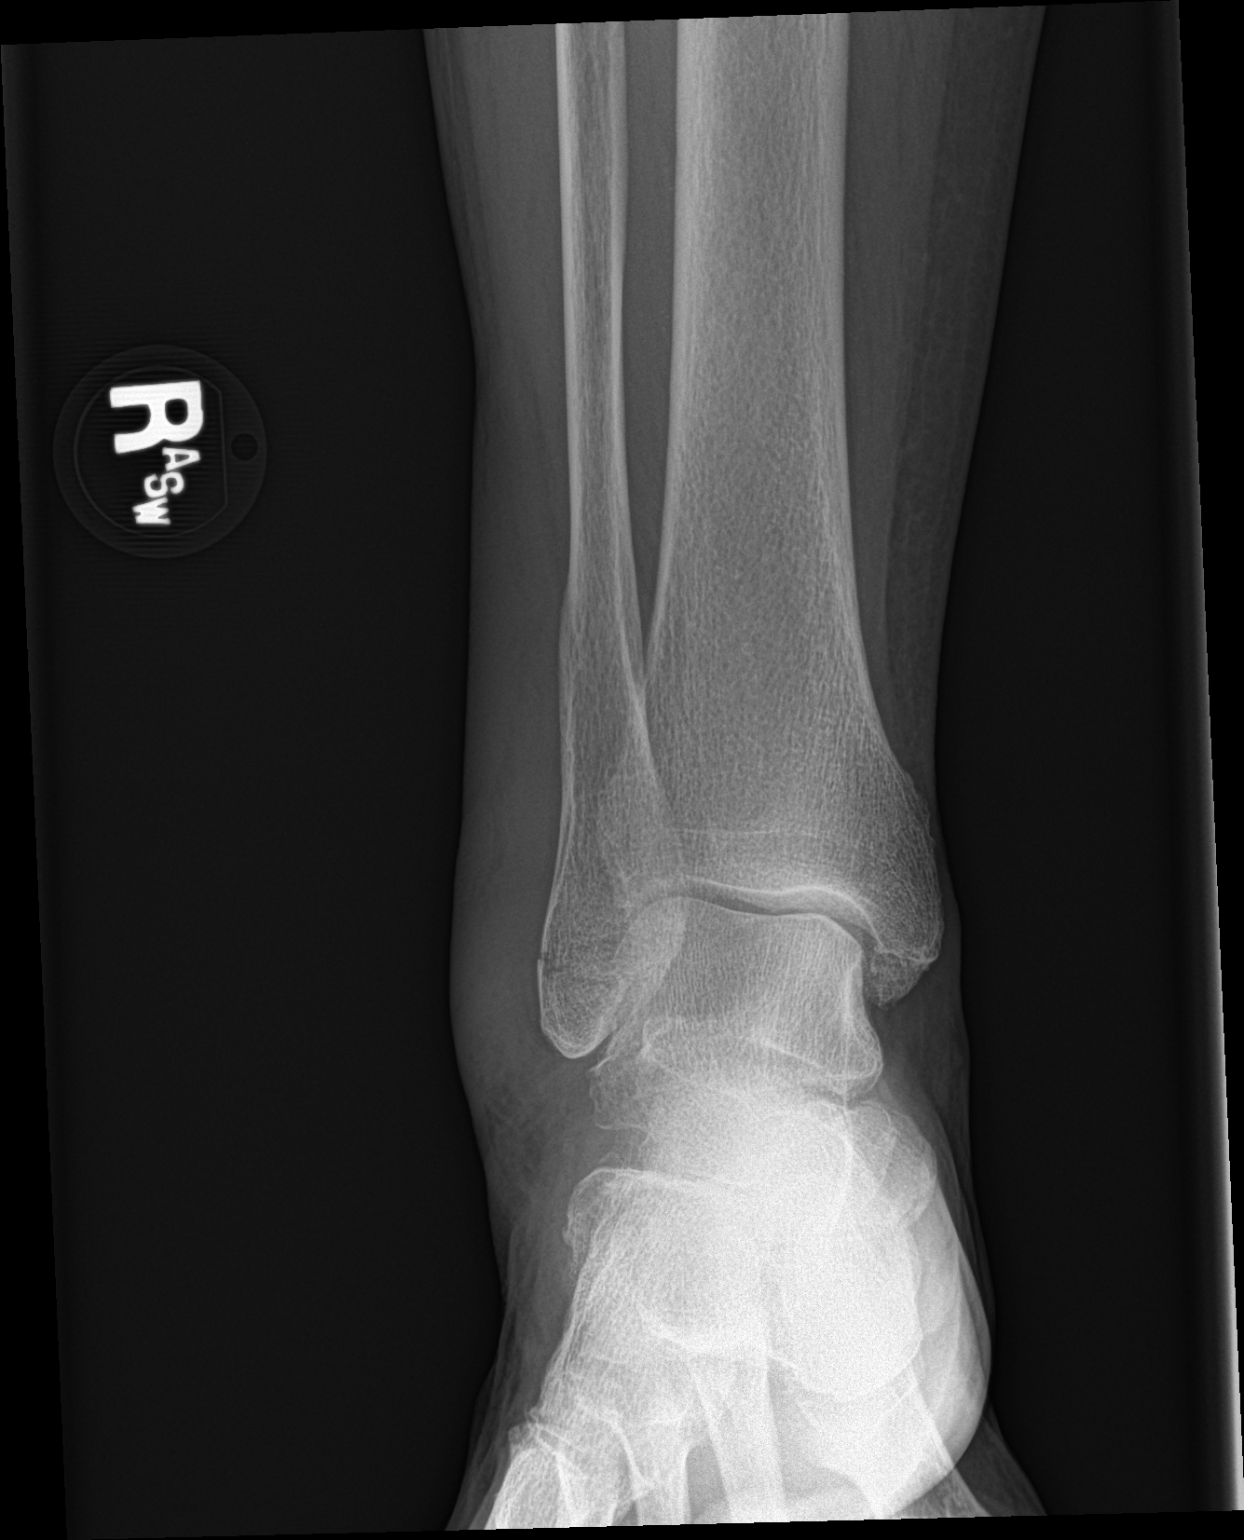

[ankle obl]
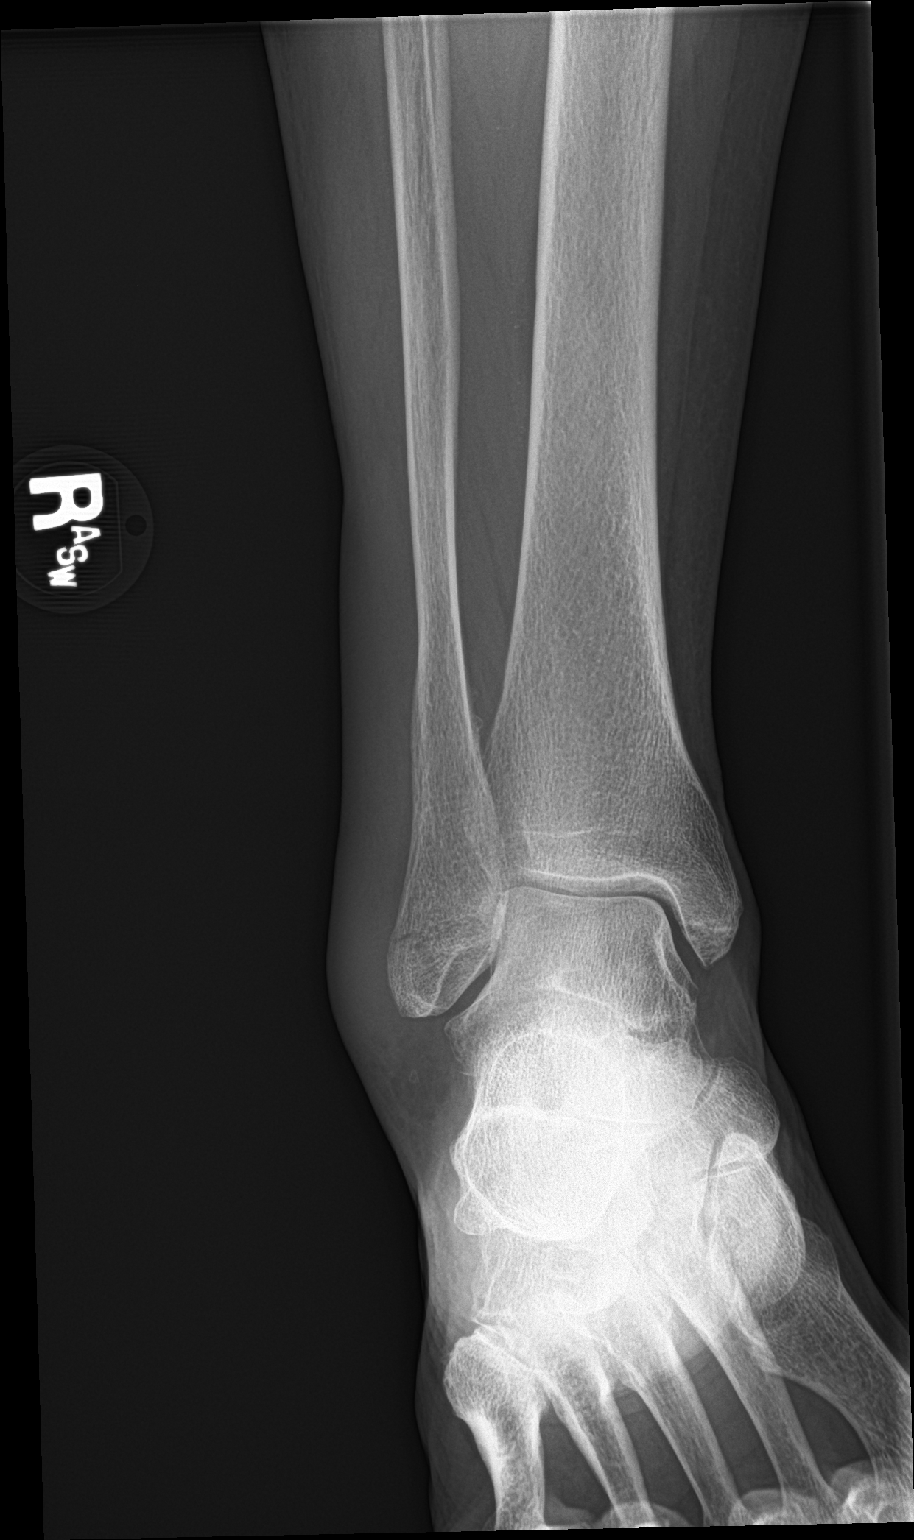

[ankle lat]
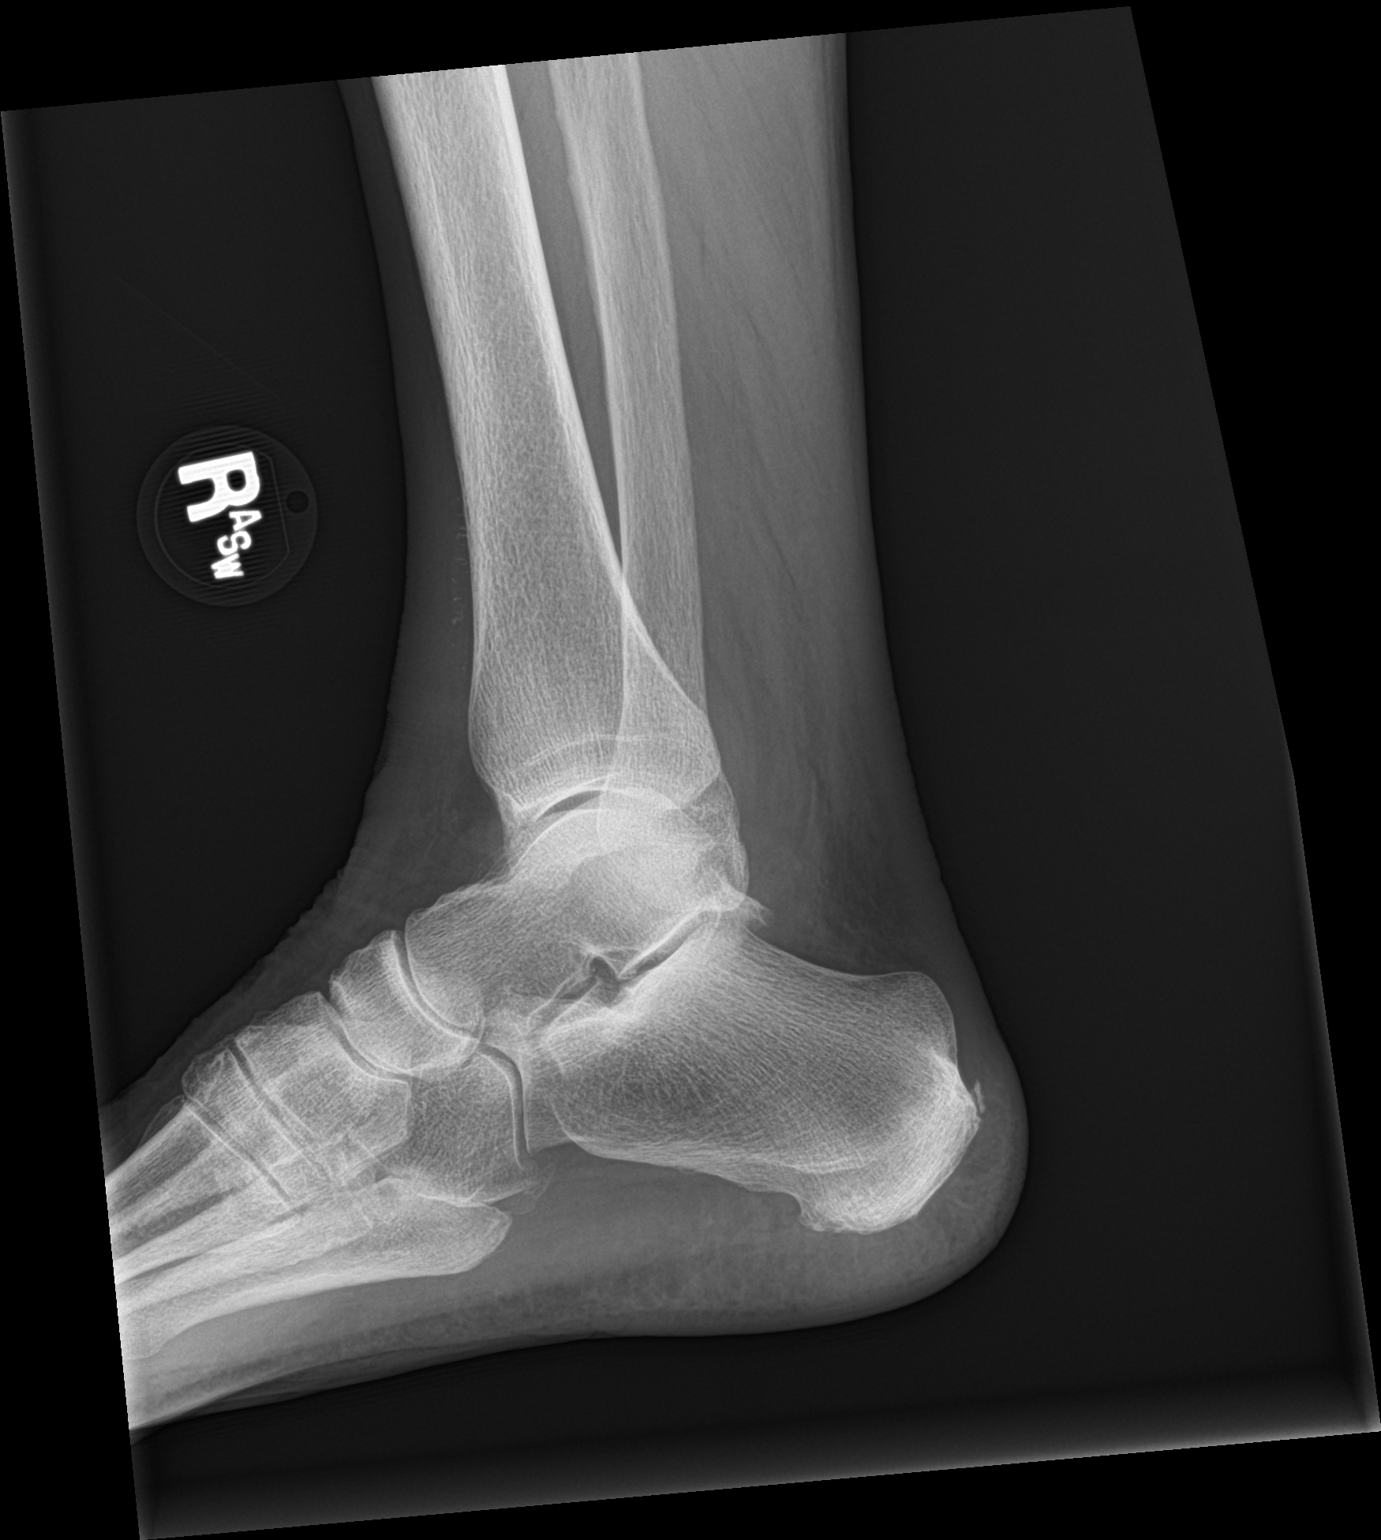

[3 of 3 positions shown; findings below may reference images not displayed]

FINDINGS: Undisplaced distal fibular fracture is noted with associated soft
tissue swelling. No tibial fracture is seen. Calcaneal spurring is
noted.
IMPRESSION: Distal fibular fracture without significant displacement. Associated
soft tissue swelling is noted.

## 2023-09-13 ENCOUNTER — Ambulatory Visit: Payer: Self-pay

## 2023-09-13 NOTE — Telephone Encounter (Signed)
Chief Complaint: Lower abdominal pressure Symptoms: 5-6/10 pain, blood on tissue when wiped, constipation Frequency: Ongoing pain all the time, not sure when it started Pertinent Negatives: Patient denies other symptoms Disposition: [] ED /[] Urgent Care (no appt availability in office) / [x] Appointment(In office/virtual)/ []  [facility]/ [] Home Care/ [] Refused Recommended Disposition /[] [facility]/ []  Follow-up with PCP Additional Notes: Patient reports constipation and seeing blood on tissue when wiped on Thurs/Fri/Sat, no blood in the toilet. She says she's dealt with constipation all her life and she's taking miralax. Advised visit tomorrow with PCP, she agrees, scheduled.    Reason for Disposition  Abdominal pain is a chronic symptom (recurrent or ongoing AND present > 4 weeks)  Answer Assessment - Initial Assessment Questions 1. LOCATION: "Where does it hurt?"      Lower abdominal pressure 2. RADIATION: "Does the pain shoot anywhere else?" (e.g., chest, back)     No 3. ONSET: "When did the pain begin?" (e.g., minutes, hours or days ago)      Unsure 4. PATTERN "Does the pain come and go, or is it constant?"    - If it comes and goes: "How long does it last?" "Do you have pain now?"     (Note: Comes and goes means the pain is intermittent. It goes away completely between bouts.)    - If constant: "Is it getting better, staying the same, or getting worse?"      (Note: Constant means the pain never goes away completely; most serious pain is constant and gets worse.)      Constant 5. SEVERITY: "How bad is the pain?"  (e.g., Scale 1-10; mild, moderate, or severe)    - MILD (1-3): Doesn't interfere with normal activities, abdomen soft and not tender to touch.     - MODERATE (4-7): Interferes with normal activities or awakens from sleep, abdomen tender to touch.     - SEVERE (8-10): Excruciating pain, doubled over, unable to do any normal activities.       5-6 6.  CAUSE: "What do you think is causing the stomach pain?"     Maybe constipation 7. RELIEVING/AGGRAVATING FACTORS: "What makes it better or worse?" (e.g., antacids, bending or twisting motion, bowel movement)     Nothing 8. OTHER SYMPTOMS: "Do you have any other symptoms?" (e.g., back pain, diarrhea, fever, urination pain, vomiting)       Constipation, blood on tissue when wiped  Protocols used: Abdominal Pain - [facility]

## 2023-09-14 ENCOUNTER — Ambulatory Visit (INDEPENDENT_AMBULATORY_CARE_PROVIDER_SITE_OTHER): Payer: Medicare Other | Admitting: Family Medicine

## 2023-09-14 ENCOUNTER — Other Ambulatory Visit: Payer: Self-pay | Admitting: Family Medicine

## 2023-09-14 ENCOUNTER — Encounter: Payer: Self-pay | Admitting: Family Medicine

## 2023-09-14 VITALS — BP 138/72 | HR 78 | Ht 62.0 in | Wt 181.0 lb

## 2023-09-14 DIAGNOSIS — N3090 Cystitis, unspecified without hematuria: Secondary | ICD-10-CM | POA: Diagnosis not present

## 2023-09-14 DIAGNOSIS — R1032 Left lower quadrant pain: Secondary | ICD-10-CM

## 2023-09-14 DIAGNOSIS — Z8719 Personal history of other diseases of the digestive system: Secondary | ICD-10-CM

## 2023-09-14 LAB — POCT URINALYSIS DIPSTICK
Bilirubin, UA: NEGATIVE
Blood, UA: NEGATIVE
Glucose, UA: NEGATIVE
Ketones, UA: NEGATIVE
Nitrite, UA: NEGATIVE
Protein, UA: NEGATIVE
Spec Grav, UA: 1.01 (ref 1.010–1.025)
Urobilinogen, UA: 0.2 U/dL
pH, UA: 6 (ref 5.0–8.0)

## 2023-09-14 MED ORDER — METRONIDAZOLE 500 MG PO TABS
500.0000 mg | ORAL_TABLET | Freq: Two times a day (BID) | ORAL | 0 refills | Status: AC
Start: 2023-09-14 — End: 2023-09-21

## 2023-09-14 MED ORDER — DOXYCYCLINE HYCLATE 100 MG PO TABS
100.0000 mg | ORAL_TABLET | Freq: Two times a day (BID) | ORAL | 0 refills | Status: DC
Start: 2023-09-14 — End: 2023-10-20

## 2023-09-14 NOTE — Progress Notes (Signed)
Date:  09/14/2023   Name:  Regina Macdonald   DOB:  01/25/1941   MRN:  [id number]   Chief Complaint: Abdominal Pain (X 2-3 months, lower, feels like pressure, may be gas )  Abdominal Pain This is a chronic problem. The current episode started more than 1 year ago. The problem occurs intermittently. The problem has been gradually improving. The pain is located in the suprapubic region. The pain is moderate. The quality of the pain is aching. The abdominal pain does not radiate. Associated symptoms include constipation. Pertinent negatives include no arthralgias, diarrhea, dysuria, fever, frequency, headaches, hematochezia, hematuria, melena, myalgias, nausea or weight loss. Nothing aggravates the pain. The pain is relieved by Nothing. Prior diagnostic workup includes lower endoscopy. There is no history of abdominal surgery or irritable bowel syndrome.    Lab Results  Component Value Date   NA 142 04/01/2023   K 4.4 04/01/2023   CO2 25 04/01/2023   GLUCOSE 79 04/01/2023   BUN 14 04/01/2023   CREATININE 0.77 04/01/2023   CALCIUM 9.3 04/01/2023   EGFR 76 04/01/2023   GFRNONAA >60 07/21/2022   Lab Results  Component Value Date   CHOL 143 04/01/2023   HDL 45 04/01/2023   LDLCALC 81 04/01/2023   TRIG 90 04/01/2023   CHOLHDL 3.0 08/19/2017   Lab Results  Component Value Date   TSH 3.16 01/24/2021   Lab Results  Component Value Date   HGBA1C 5.7 (H) 12/02/2015   Lab Results  Component Value Date   WBC 8.0 02/10/2023   HGB 11.4 (L) 02/10/2023   HCT 34.6 (L) 02/10/2023   MCV 93.0 02/10/2023   PLT 294 02/10/2023   Lab Results  Component Value Date   ALT 13 04/01/2023   AST 23 04/01/2023   ALKPHOS 123 (H) 04/01/2023   BILITOT 0.3 04/01/2023   No results found for: "25OHVITD2", "25OHVITD3", "VD25OH"   Review of Systems  Constitutional: Negative.  Negative for chills, fatigue, fever, unexpected weight change and weight loss.  HENT:  Negative for congestion, ear  discharge, ear pain, rhinorrhea, sinus pressure, sneezing and sore throat.   Respiratory:  Negative for cough, shortness of breath, wheezing and stridor.   Gastrointestinal:  Positive for abdominal pain and constipation. Negative for blood in stool, diarrhea, hematochezia, melena and nausea.  Genitourinary:  Negative for dysuria, flank pain, frequency, hematuria, urgency and vaginal discharge.  Musculoskeletal:  Negative for arthralgias, back pain and myalgias.  Skin:  Negative for rash.  Neurological:  Negative for dizziness, weakness and headaches.  Hematological:  Negative for adenopathy. Does not bruise/bleed easily.  Psychiatric/Behavioral:  Negative for dysphoric mood. The patient is not nervous/anxious.     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   Anxiety 03/23/2018   Taking medication for chronic disease 03/23/2018   Other constipation 12/13/2017   Depression with anxiety 08/19/2017   Hyperlipidemia 08/19/2017   Gastroesophageal reflux disease without esophagitis 08/19/2017   Other chest pain 04/16/2015   COPD exacerbation (HCC) 04/16/2015   Sleep apnea 04/16/2015   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   Asthmatic bronchitis 04/16/2015   Abdominal pain, LLQ 03/29/2014   Rectal bleeding 03/29/2014   Arthritis, 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   Osteoarthrosis, unspecified whether generalized or localized, pelvic region and thigh 03/26/2014  Allergies  Allergen Reactions   Ciprofloxacin Other (See Comments)   Etodolac Other (See Comments)   Tramadol Other (See Comments)    Slurred speech/ confusion   Sulfa Antibiotics Rash    Past Surgical History:  Procedure Laterality Date   ABDOMINAL HYSTERECTOMY     COLONOSCOPY  06/08/2013   Dr Bluford Kaufmann-  small mouth diverticula   HIP SURGERY Left    KNEE SURGERY Bilateral    NASAL RECONSTRUCTION     x 2    Social History   Tobacco Use   Smoking status: Former   Smokeless tobacco: Never  Vaping Use   Vaping status: Never Used  Substance Use Topics   Alcohol use: Not Currently   Drug use: Never     Medication list has been reviewed and updated.  Current Meds  Medication Sig   acetaminophen (TYLENOL) 500 MG tablet Take 500 mg by mouth every 6 (six) hours as needed.   albuterol (VENTOLIN HFA) 108 (90 Base) MCG/ACT inhaler INHALE 2 PUFFS INTO THE LUNGS EVERY 6 HOURS AS NEEDED FOR WHEEZING OR SHORTNESS OF BREATH   aspirin 81 MG chewable tablet Chew 81 mg by mouth daily.   BREO ELLIPTA 100-25 MCG/ACT AEPB INHALE 1 PUFF BY MOUTH ONCE DAILY   CALCIUM & MAGNESIUM CARBONATES PO Take 1 tablet by mouth daily.   EQUATE STOOL SOFTENER 100 MG capsule Take 1 capsule by mouth twice daily   FLUoxetine (PROZAC) 10 MG capsule Take 1 capsule (10 mg total) by mouth daily.   FLUoxetine (PROZAC) 20 MG capsule Take 1 capsule (20 mg total) by mouth daily.   fluticasone (FLONASE) 50 MCG/ACT nasal spray SHAKE LIQUID AND USE 2 SPRAYS IN EACH NOSTRIL DAILY   gemfibrozil (LOPID) 600 MG tablet TAKE 1 TABLET BY MOUTH TWICE DAILY   ibuprofen (ADVIL) 200 MG tablet Take 400 mg by mouth every 6 (six) hours as needed for moderate pain.   loratadine (EQ LORATADINE) 10 MG tablet Take 1 tablet (10 mg total) by mouth daily.   Multiple Vitamins-Minerals (CENTRUM SILVER PO) Take 1 capsule by mouth daily.   pantoprazole (PROTONIX) 40 MG tablet Take 1 tablet (40 mg total) by mouth daily.   polyethylene glycol (MIRALAX) packet Take 17 g by mouth daily.       09/14/2023    1:44 PM 07/15/2023    3:05 PM 04/01/2023   11:01 AM 02/12/2023    3:15 PM  GAD 7 : Generalized Anxiety Score  Nervous, Anxious, on Edge 0 0 0 0  Control/stop worrying 0 0 0 0  Worry too much - different things 0 0 0 0  Trouble relaxing 0 0 0 0   Restless 0 0 0 0  Easily annoyed or irritable 0 0 0 0  Afraid - awful might happen 0 0 0 0  Total GAD 7 Score 0 0 0 0  Anxiety Difficulty Not difficult at all Not difficult at all Not difficult at all Not difficult at all       09/14/2023    1:44 PM 07/15/2023    3:05 PM 04/01/2023   11:01 AM  Depression screen PHQ 2/9  Decreased Interest 1 0 0  Down, Depressed, Hopeless 0 0 0  PHQ - 2 Score 1 0 0  Altered sleeping 0 0 0  Tired, decreased energy 1 0 0  Change in appetite 1 0 0  Feeling bad or failure about yourself  1 0 0  Trouble concentrating 0 0 0  Moving slowly  or fidgety/restless 0 0 0  Suicidal thoughts 0 0 0  PHQ-9 Score 4 0 0  Difficult doing work/chores Not difficult at all Not difficult at all Not difficult at all    BP Readings from Last 3 Encounters:  09/14/23 138/72  07/15/23 124/70  07/06/23 138/74    Physical Exam Vitals and nursing note reviewed. Exam conducted with a chaperone present.  Constitutional:      General: She is not in acute distress.    Appearance: She is not diaphoretic.  HENT:     Head: Normocephalic and atraumatic.     Right Ear: External ear normal.     Left Ear: External ear normal.     Nose: Nose normal.     Mouth/Throat:     Pharynx: No pharyngeal swelling or oropharyngeal exudate.  Eyes:     General:        Right eye: No discharge.        Left eye: No discharge.     Conjunctiva/sclera: Conjunctivae normal.     Pupils: Pupils are equal, round, and reactive to light.  Neck:     Thyroid: No thyromegaly.     Vascular: No JVD.  Cardiovascular:     Rate and Rhythm: Normal rate and regular rhythm.     Heart sounds: Normal heart sounds. No murmur heard.    No friction rub. No gallop.  Pulmonary:     Effort: Pulmonary effort is normal. No respiratory distress.     Breath sounds: Normal breath sounds. No stridor.  Abdominal:     General: Bowel sounds are normal.     Palpations: Abdomen is soft. There is no hepatomegaly,  splenomegaly, mass or pulsatile mass.     Tenderness: There is abdominal tenderness in the suprapubic area. There is no right CVA tenderness, left CVA tenderness, guarding or rebound.  Musculoskeletal:        General: Normal range of motion.     Cervical back: Normal range of motion and neck supple.  Lymphadenopathy:     Cervical: No cervical adenopathy.  Skin:    General: Skin is warm and dry.  Neurological:     Mental Status: She is alert.     Wt Readings from Last 3 Encounters:  09/14/23 181 lb (82.1 kg)  07/15/23 183 lb (83 kg)  07/06/23 183 lb (83 kg)    BP 138/72   Pulse 78   Ht 5\' 2"  (1.575 m)   Wt 181 lb (82.1 kg)   SpO2 96%   BMI 33.11 kg/m   Assessment and Plan: 1. Abdominal pain, LLQ New onset.  Episodic.  This is the second episode of lower abdominal pain and this is been according to the patient going on for months.  Actually is probably been going on more than like 4 to 6 weeks and is not associated with hematochezia or melena.  There is mild tenderness suprapubic but not in the left lower quadrant.  There is no palpable mass no guarding or rebound.  Patient on review of her colonoscopy in 2014 did note diverticulosis.  We will treat with doxycycline 100 mg twice a day for 10 days and metronidazole 500 mg twice a day for 10 days and refer to GI for reevaluation since this is the second episode - CBC with Differential/Platelet - doxycycline (VIBRA-TABS) 100 MG tablet; Take 1 tablet (100 mg total) by mouth 2 (two) times daily.  Dispense: 20 tablet; Refill: 0 - metroNIDAZOLE (FLAGYL) 500 MG tablet; Take  1 tablet (500 mg total) by mouth 2 (two) times daily for 7 days.  Dispense: 14 tablet; Refill: 0 - Ambulatory referral to Gastroenterology  2. History of diverticulosis Patient does have a history of diverticulosis is noted above and we will going to pursue further evaluation.  Urinalysis was done and - CBC with Differential/Platelet - doxycycline (VIBRA-TABS) 100 MG  tablet; Take 1 tablet (100 mg total) by mouth 2 (two) times daily.  Dispense: 20 tablet; Refill: 0 - metroNIDAZOLE (FLAGYL) 500 MG tablet; Take 1 tablet (500 mg total) by mouth 2 (two) times daily for 7 days.  Dispense: 14 tablet; Refill: 0 - Ambulatory referral to Gastroenterology   3.  Cystitis.  New onset.  Persistent.  Patient's had this for several weeks with urgent see frequency or dysuria.  Urinalysis notes large leukocyte count.  We will send for culture and we will treat with doxycycline which I think will pick up the GI tract as well as cystitis and given that she is allergic to Cipro and sulfa which takes him out of my choices this looks like do not most logical for UTI and possibility of diverticulitis.  Elizabeth Sauer, MD

## 2023-09-15 ENCOUNTER — Encounter: Payer: Self-pay | Admitting: Family Medicine

## 2023-09-15 LAB — CBC WITH DIFFERENTIAL/PLATELET
Basophils Absolute: 0.1 10*3/uL (ref 0.0–0.2)
Basos: 1 %
EOS (ABSOLUTE): 0.2 10*3/uL (ref 0.0–0.4)
Eos: 3 %
Hematocrit: 33.2 % — ABNORMAL LOW (ref 34.0–46.6)
Hemoglobin: 10.6 g/dL — ABNORMAL LOW (ref 11.1–15.9)
Immature Grans (Abs): 0 10*3/uL (ref 0.0–0.1)
Immature Granulocytes: 0 %
Lymphocytes Absolute: 1.5 10*3/uL (ref 0.7–3.1)
Lymphs: 20 %
MCH: 30 pg (ref 26.6–33.0)
MCHC: 31.9 g/dL (ref 31.5–35.7)
MCV: 94 fL (ref 79–97)
Monocytes Absolute: 0.5 10*3/uL (ref 0.1–0.9)
Monocytes: 7 %
Neutrophils Absolute: 5 10*3/uL (ref 1.4–7.0)
Neutrophils: 69 %
Platelets: 342 10*3/uL (ref 150–450)
RBC: 3.53 x10E6/uL — ABNORMAL LOW (ref 3.77–5.28)
RDW: 12.6 % (ref 11.7–15.4)
WBC: 7.3 10*3/uL (ref 3.4–10.8)

## 2023-09-18 ENCOUNTER — Encounter: Payer: Self-pay | Admitting: Family Medicine

## 2023-09-18 LAB — URINE CULTURE

## 2023-09-21 ENCOUNTER — Ambulatory Visit (INDEPENDENT_AMBULATORY_CARE_PROVIDER_SITE_OTHER): Payer: Medicare Other | Admitting: Family Medicine

## 2023-09-21 ENCOUNTER — Telehealth: Payer: Self-pay | Admitting: Internal Medicine

## 2023-09-21 ENCOUNTER — Encounter: Payer: Self-pay | Admitting: Family Medicine

## 2023-09-21 VITALS — BP 120/78 | HR 82 | Ht 62.0 in | Wt 179.0 lb

## 2023-09-21 DIAGNOSIS — R1032 Left lower quadrant pain: Secondary | ICD-10-CM | POA: Diagnosis not present

## 2023-09-21 DIAGNOSIS — D649 Anemia, unspecified: Secondary | ICD-10-CM | POA: Diagnosis not present

## 2023-09-21 NOTE — Telephone Encounter (Signed)
Vm not set up, sent mychart message to confirm 09/29/23 appointment-Toni

## 2023-09-21 NOTE — Progress Notes (Signed)
Date:  09/21/2023   Name:  Regina Macdonald   DOB:  04/08/1941   MRN:  [id number]   Chief Complaint: Abdominal Pain (Follow up from 1 week ago. Patient treated with doxycyline and metronidazole. Patient only feels slightly better. )  Abdominal Pain This is a recurrent problem. The current episode started 1 to 4 weeks ago. The problem has been gradually improving. The pain is located in the LLQ. The pain is at a severity of 3/10 (from a 7). The pain is mild. The quality of the pain is colicky. The abdominal pain does not radiate. Associated symptoms include constipation. Pertinent negatives include no arthralgias, diarrhea, dysuria, fever, frequency, headaches, hematochezia, hematuria, melena, myalgias, nausea or weight loss. Nothing aggravates the pain. The pain is relieved by Nothing. She has tried antibiotics for the symptoms.    Lab Results  Component Value Date   NA 142 04/01/2023   K 4.4 04/01/2023   CO2 25 04/01/2023   GLUCOSE 79 04/01/2023   BUN 14 04/01/2023   CREATININE 0.77 04/01/2023   CALCIUM 9.3 04/01/2023   EGFR 76 04/01/2023   GFRNONAA >60 07/21/2022   Lab Results  Component Value Date   CHOL 143 04/01/2023   HDL 45 04/01/2023   LDLCALC 81 04/01/2023   TRIG 90 04/01/2023   CHOLHDL 3.0 08/19/2017   Lab Results  Component Value Date   TSH 3.16 01/24/2021   Lab Results  Component Value Date   HGBA1C 5.7 (H) 12/02/2015   Lab Results  Component Value Date   WBC 7.3 09/14/2023   HGB 10.6 (L) 09/14/2023   HCT 33.2 (L) 09/14/2023   MCV 94 09/14/2023   PLT 342 09/14/2023   Lab Results  Component Value Date   ALT 13 04/01/2023   AST 23 04/01/2023   ALKPHOS 123 (H) 04/01/2023   BILITOT 0.3 04/01/2023   No results found for: "25OHVITD2", "25OHVITD3", "VD25OH"   Review of Systems  Constitutional: Negative.  Negative for chills, fatigue, fever, unexpected weight change and weight loss.  HENT:  Negative for congestion, ear discharge, ear pain, rhinorrhea,  sinus pressure, sneezing and sore throat.   Respiratory:  Negative for cough, shortness of breath, wheezing and stridor.   Gastrointestinal:  Positive for abdominal pain and constipation. Negative for blood in stool, diarrhea, hematochezia, melena and nausea.  Genitourinary:  Negative for dysuria, flank pain, frequency, hematuria, urgency and vaginal discharge.  Musculoskeletal:  Negative for arthralgias, back pain and myalgias.  Skin:  Negative for rash.  Neurological:  Negative for dizziness, weakness and headaches.  Hematological:  Negative for adenopathy. Does not bruise/bleed easily.  Psychiatric/Behavioral:  Negative for dysphoric mood. The patient is not nervous/anxious.     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   Anxiety 03/23/2018   Taking medication for chronic disease 03/23/2018   Other constipation 12/13/2017   Depression with anxiety 08/19/2017   Hyperlipidemia 08/19/2017   Gastroesophageal reflux disease without esophagitis 08/19/2017   Other chest pain 04/16/2015   COPD exacerbation (HCC) 04/16/2015   Sleep apnea 04/16/2015   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   Asthmatic bronchitis 04/16/2015   Abdominal pain, LLQ 03/29/2014   Rectal bleeding 03/29/2014   Arthritis, 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   Osteoarthrosis, unspecified whether generalized or localized, pelvic region  and thigh 03/26/2014    Allergies  Allergen Reactions   Ciprofloxacin Other (See Comments)   Etodolac Other (See Comments)   Tramadol Other (See Comments)    Slurred speech/ confusion   Sulfa Antibiotics Rash    Past Surgical History:  Procedure Laterality Date   ABDOMINAL HYSTERECTOMY     COLONOSCOPY  06/08/2013   Dr Bluford Kaufmann- small mouth diverticula   HIP  SURGERY Left    KNEE SURGERY Bilateral    NASAL RECONSTRUCTION     x 2    Social History   Tobacco Use   Smoking status: Former   Smokeless tobacco: Never  Vaping Use   Vaping status: Never Used  Substance Use Topics   Alcohol use: Not Currently   Drug use: Never     Medication list has been reviewed and updated.  Current Meds  Medication Sig   acetaminophen (TYLENOL) 500 MG tablet Take 500 mg by mouth every 6 (six) hours as needed.   albuterol (VENTOLIN HFA) 108 (90 Base) MCG/ACT inhaler INHALE 2 PUFFS INTO THE LUNGS EVERY 6 HOURS AS NEEDED FOR WHEEZING OR SHORTNESS OF BREATH   aspirin 81 MG chewable tablet Chew 81 mg by mouth daily.   BREO ELLIPTA 100-25 MCG/ACT AEPB INHALE 1 PUFF BY MOUTH ONCE DAILY   CALCIUM & MAGNESIUM CARBONATES PO Take 1 tablet by mouth daily.   doxycycline (VIBRA-TABS) 100 MG tablet Take 1 tablet (100 mg total) by mouth 2 (two) times daily.   EQUATE STOOL SOFTENER 100 MG capsule Take 1 capsule by mouth twice daily   FLUoxetine (PROZAC) 10 MG capsule Take 1 capsule (10 mg total) by mouth daily.   FLUoxetine (PROZAC) 20 MG capsule Take 1 capsule (20 mg total) by mouth daily.   fluticasone (FLONASE) 50 MCG/ACT nasal spray SHAKE LIQUID AND USE 2 SPRAYS IN EACH NOSTRIL DAILY   gemfibrozil (LOPID) 600 MG tablet TAKE 1 TABLET BY MOUTH TWICE DAILY   ibuprofen (ADVIL) 200 MG tablet Take 400 mg by mouth every 6 (six) hours as needed for moderate pain.   loratadine (EQ LORATADINE) 10 MG tablet Take 1 tablet (10 mg total) by mouth daily.   metroNIDAZOLE (FLAGYL) 500 MG tablet Take 1 tablet (500 mg total) by mouth 2 (two) times daily for 7 days.   Multiple Vitamins-Minerals (CENTRUM SILVER PO) Take 1 capsule by mouth daily.   pantoprazole (PROTONIX) 40 MG tablet Take 1 tablet (40 mg total) by mouth daily.   polyethylene glycol (MIRALAX) packet Take 17 g by mouth daily.       09/14/2023    1:44 PM 07/15/2023    3:05 PM 04/01/2023   11:01 AM 02/12/2023    3:15 PM   GAD 7 : Generalized Anxiety Score  Nervous, Anxious, on Edge 0 0 0 0  Control/stop worrying 0 0 0 0  Worry too much - different things 0 0 0 0  Trouble relaxing 0 0 0 0  Restless 0 0 0 0  Easily annoyed or irritable 0 0 0 0  Afraid - awful might happen 0 0 0 0  Total GAD 7 Score 0 0 0 0  Anxiety Difficulty Not difficult at all Not difficult at all Not difficult at all Not difficult at all       09/14/2023    1:44 PM 07/15/2023    3:05 PM 04/01/2023   11:01 AM  Depression screen PHQ 2/9  Decreased Interest 1 0 0  Down, Depressed, Hopeless 0 0 0  PHQ - 2 Score 1 0 0  Altered sleeping 0 0 0  Tired, decreased energy 1 0 0  Change in appetite 1 0 0  Feeling bad or failure about yourself  1 0 0  Trouble concentrating 0 0 0  Moving slowly or fidgety/restless 0 0 0  Suicidal thoughts 0 0 0  PHQ-9 Score 4 0 0  Difficult doing work/chores Not difficult at all Not difficult at all Not difficult at all    BP Readings from Last 3 Encounters:  09/21/23 120/78  09/14/23 138/72  07/15/23 124/70    Physical Exam Vitals and nursing note reviewed. Exam conducted with a chaperone present.  Constitutional:      General: She is not in acute distress.    Appearance: She is not diaphoretic.  HENT:     Head: Normocephalic and atraumatic.     Right Ear: External ear normal.     Left Ear: External ear normal.     Nose: Nose normal.  Eyes:     General:        Right eye: No discharge.        Left eye: No discharge.     Conjunctiva/sclera: Conjunctivae normal.     Pupils: Pupils are equal, round, and reactive to light.  Neck:     Thyroid: No thyromegaly.     Vascular: No JVD.  Cardiovascular:     Rate and Rhythm: Normal rate and regular rhythm.     Heart sounds: Normal heart sounds. No murmur heard.    No friction rub. No gallop.  Pulmonary:     Effort: Pulmonary effort is normal.     Breath sounds: Normal breath sounds.  Abdominal:     General: Bowel sounds are normal.      Palpations: Abdomen is soft. There is no mass.     Tenderness: There is abdominal tenderness in the suprapubic area and left lower quadrant. There is no right CVA tenderness, left CVA tenderness or guarding.  Genitourinary:    Rectum: Normal. Guaiac result negative. No mass.  Musculoskeletal:        General: Normal range of motion.     Cervical back: Normal range of motion and neck supple.  Lymphadenopathy:     Cervical: No cervical adenopathy.  Skin:    General: Skin is warm and dry.  Neurological:     Mental Status: She is alert.     Deep Tendon Reflexes: Reflexes are normal and symmetric.     Wt Readings from Last 3 Encounters:  09/21/23 179 lb (81.2 kg)  09/14/23 181 lb (82.1 kg)  07/15/23 183 lb (83 kg)    BP 120/78   Pulse 82   Ht 5\' 2"  (1.575 m)   Wt 179 lb (81.2 kg)   SpO2 98%   BMI 32.74 kg/m   Assessment and Plan:  1. Abdominal pain, LLQ New onset.  Continues to have pain with malaise but has improved from a 7 to a 3.  Patient has antibiotics that she still taking I encouraged her to continue for the full course.  Examination is noted to still have tenderness in the left lower quadrant without guarding and without rebound.  Bowel sounds are hyperactive.  Rectal exam is normal and guaiac is negative.  We will check renal function panel for electrolytes disturbances and will recheck next week since patient is still having pain. - Renal Function Panel  2. Anemia, unspecified type Chronic.  Patient has had some anemia off  and on it may be secondary to diverticulitis.  We will check her CBC to see if it is improving and if not we will probably initiate iron.  Patient is not orthostatic or dizzy and other than the malaise she is doing well - CBC with Differential/Platelet    Elizabeth Sauer, MD

## 2023-09-22 LAB — CBC WITH DIFFERENTIAL/PLATELET
Basophils Absolute: 0.1 10*3/uL (ref 0.0–0.2)
Basos: 1 %
EOS (ABSOLUTE): 0.2 10*3/uL (ref 0.0–0.4)
Eos: 3 %
Hematocrit: 34.1 % (ref 34.0–46.6)
Hemoglobin: 11.1 g/dL (ref 11.1–15.9)
Immature Grans (Abs): 0.1 10*3/uL (ref 0.0–0.1)
Immature Granulocytes: 1 %
Lymphocytes Absolute: 1.8 10*3/uL (ref 0.7–3.1)
Lymphs: 23 %
MCH: 30.3 pg (ref 26.6–33.0)
MCHC: 32.6 g/dL (ref 31.5–35.7)
MCV: 93 fL (ref 79–97)
Monocytes Absolute: 0.6 10*3/uL (ref 0.1–0.9)
Monocytes: 8 %
Neutrophils Absolute: 5.1 10*3/uL (ref 1.4–7.0)
Neutrophils: 64 %
Platelets: 334 10*3/uL (ref 150–450)
RBC: 3.66 x10E6/uL — ABNORMAL LOW (ref 3.77–5.28)
RDW: 12.5 % (ref 11.7–15.4)
WBC: 7.9 10*3/uL (ref 3.4–10.8)

## 2023-09-22 LAB — RENAL FUNCTION PANEL
Albumin: 4.6 g/dL (ref 3.7–4.7)
BUN/Creatinine Ratio: 21 (ref 12–28)
BUN: 16 mg/dL (ref 8–27)
CO2: 23 mmol/L (ref 20–29)
Calcium: 9.6 mg/dL (ref 8.7–10.3)
Chloride: 102 mmol/L (ref 96–106)
Creatinine, Ser: 0.76 mg/dL (ref 0.57–1.00)
Glucose: 100 mg/dL — ABNORMAL HIGH (ref 70–99)
Phosphorus: 2.9 mg/dL — ABNORMAL LOW (ref 3.0–4.3)
Potassium: 4.4 mmol/L (ref 3.5–5.2)
Sodium: 139 mmol/L (ref 134–144)
eGFR: 77 mL/min/{1.73_m2} (ref >59)

## 2023-09-28 ENCOUNTER — Other Ambulatory Visit: Payer: Self-pay | Admitting: Family Medicine

## 2023-09-28 DIAGNOSIS — J309 Allergic rhinitis, unspecified: Secondary | ICD-10-CM

## 2023-09-29 ENCOUNTER — Ambulatory Visit (INDEPENDENT_AMBULATORY_CARE_PROVIDER_SITE_OTHER): Payer: Medicare Other | Admitting: Internal Medicine

## 2023-09-29 DIAGNOSIS — J4489 Other specified chronic obstructive pulmonary disease: Secondary | ICD-10-CM | POA: Diagnosis not present

## 2023-10-05 ENCOUNTER — Ambulatory Visit: Payer: Medicare Other | Admitting: Family Medicine

## 2023-10-11 ENCOUNTER — Ambulatory Visit: Payer: Medicare Other | Admitting: Internal Medicine

## 2023-10-11 ENCOUNTER — Encounter: Payer: Self-pay | Admitting: Internal Medicine

## 2023-10-11 VITALS — BP 125/60 | HR 76 | Temp 97.8°F | Resp 16 | Ht 62.0 in | Wt 180.6 lb

## 2023-10-11 DIAGNOSIS — J986 Disorders of diaphragm: Secondary | ICD-10-CM

## 2023-10-11 DIAGNOSIS — G4733 Obstructive sleep apnea (adult) (pediatric): Secondary | ICD-10-CM | POA: Diagnosis not present

## 2023-10-11 DIAGNOSIS — J4489 Other specified chronic obstructive pulmonary disease: Secondary | ICD-10-CM | POA: Diagnosis not present

## 2023-10-11 DIAGNOSIS — R0602 Shortness of breath: Secondary | ICD-10-CM

## 2023-10-11 MED ORDER — BREZTRI AEROSPHERE 160-9-4.8 MCG/ACT IN AERO: 2.0000 | INHALATION_SPRAY | Freq: Two times a day (BID) | RESPIRATORY_TRACT | 11 refills | Status: DC

## 2023-10-11 MED ORDER — IPRATROPIUM-ALBUTEROL 0.5-2.5 (3) MG/3ML IN SOLN: 3.0000 mL | Freq: Four times a day (QID) | RESPIRATORY_TRACT | 4 refills | Status: DC | PRN

## 2023-10-11 NOTE — Progress Notes (Signed)
[facility] [address] [place], [place] [address]  Pulmonary Sleep Medicine   Office Visit Note  Patient Name: Regina Macdonald DOB: August 17, 1941 MRN [id number]  Date of Service: 10/11/2023  Complaints/HPI: She states that her breo is not quite quite working like it used to. She states she notes a little more shortness of breath. She is currently not on oxygen. She is on CPAP and has been using it as prescribed. She had been on nebs in the past and this had helped now is not on them  Office Spirometry Results:     ROS  General: (-) fever, (-) chills, (-) night sweats, (-) weakness Skin: (-) rashes, (-) itching,. Eyes: (-) visual changes, (-) redness, (-) itching. Nose and Sinuses: (-) nasal stuffiness or itchiness, (-) postnasal drip, (-) nosebleeds, (-) sinus trouble. Mouth and Throat: (-) sore throat, (-) hoarseness. Neck: (-) swollen glands, (-) enlarged thyroid, (-) neck pain. Respiratory: + cough, (-) bloody sputum, + shortness of breath, - wheezing. Cardiovascular: - ankle swelling, (-) chest pain. Lymphatic: (-) lymph node enlargement. Neurologic: (-) numbness, (-) tingling. Psychiatric: (-) anxiety, (-) depression   Current Medication: Outpatient Encounter Medications as of 10/11/2023  Medication Sig   acetaminophen (TYLENOL) 500 MG tablet Take 500 mg by mouth every 6 (six) hours as needed.   albuterol (VENTOLIN HFA) 108 (90 Base) MCG/ACT inhaler INHALE 2 PUFFS INTO THE LUNGS EVERY 6 HOURS AS NEEDED FOR WHEEZING OR SHORTNESS OF BREATH   aspirin 81 MG chewable tablet Chew 81 mg by mouth daily.   BREO ELLIPTA 100-25 MCG/ACT AEPB INHALE 1 PUFF BY MOUTH ONCE DAILY   CALCIUM & MAGNESIUM CARBONATES PO Take 1 tablet by mouth daily.   doxycycline (VIBRA-TABS) 100 MG tablet Take 1 tablet (100 mg total) by mouth 2 (two) times daily.   EQUATE STOOL SOFTENER 100 MG capsule Take 1 capsule by mouth twice daily   FLUoxetine (PROZAC) 10 MG capsule Take 1 capsule (10  mg total) by mouth daily.   FLUoxetine (PROZAC) 20 MG capsule Take 1 capsule (20 mg total) by mouth daily.   fluticasone (FLONASE) 50 MCG/ACT nasal spray SHAKE LIQUID AND USE 2 SPRAYS IN EACH NOSTRIL DAILY   gemfibrozil (LOPID) 600 MG tablet TAKE 1 TABLET BY MOUTH TWICE DAILY   ibuprofen (ADVIL) 200 MG tablet Take 400 mg by mouth every 6 (six) hours as needed for moderate pain.   loratadine (EQ LORATADINE) 10 MG tablet Take 1 tablet (10 mg total) by mouth daily.   Multiple Vitamins-Minerals (CENTRUM SILVER PO) Take 1 capsule by mouth daily.   pantoprazole (PROTONIX) 40 MG tablet Take 1 tablet (40 mg total) by mouth daily.   polyethylene glycol (MIRALAX) packet Take 17 g by mouth daily.   No facility-administered encounter medications on file as of 10/11/2023.    Surgical History: Past Surgical History:  Procedure Laterality Date   ABDOMINAL HYSTERECTOMY     COLONOSCOPY  06/08/2013   Dr Bluford Kaufmann- small mouth diverticula   HIP SURGERY Left    KNEE SURGERY Bilateral    NASAL RECONSTRUCTION     x 2    Medical History: Past Medical History:  Diagnosis Date   Anxiety    Asthma    Cancer (HCC) cervical-1973   COPD (chronic obstructive pulmonary disease) (HCC)    Depression    Diverticula, colon    Diverticulosis    GERD (gastroesophageal reflux disease)    Sleep apnea     Family History: Family History  Problem Relation Age of Onset   Cirrhosis Father    Coronary artery disease Mother    Breast cancer Neg Hx     Social History: Social History   Socioeconomic History   Marital status: Divorced    Spouse name: Not on file   Number of children: 3   Years of education: 12   Highest education level: High school graduate  Occupational History   Occupation: retired  Tobacco Use   Smoking status: Former   Smokeless tobacco: Never  Building services engineer status: Never Used  Substance and Sexual Activity   Alcohol use: Not Currently   Drug use: Never   Sexual activity: Not  Currently    Partners: Male    Birth control/protection: Post-menopausal  Other Topics Concern   Not on file  Social History Narrative   Pt lives with her daughter   Social Determinants of Health   Financial Resource Strain: Low Risk  (10/30/2022)   Overall Financial Resource Strain (CARDIA)    Difficulty of Paying Living Expenses: Not hard at all  Food Insecurity: No Food Insecurity (10/30/2022)   Hunger Vital Sign    Worried About Running Out of Food in the Last Year: Never true    Ran Out of Food in the Last Year: Never true  Transportation Needs: No Transportation Needs (10/30/2022)   PRAPARE - Administrator, Civil Service (Medical): No    Lack of Transportation (Non-Medical): No  Physical Activity: Insufficiently Active (10/30/2022)   Exercise Vital Sign    Days of Exercise per Week: 7 days    Minutes of Exercise per Session: 10 min  Stress: No Stress Concern Present (10/30/2022)   [employer] of Occupational Health - Occupational Stress Questionnaire    Feeling of Stress : Only a little  Social Connections: Socially Isolated (10/30/2022)   Social Connection and Isolation Panel [NHANES]    Frequency of Communication with Friends and Family: More than three times a week    Frequency of Social Gatherings with Friends and Family: Three times a week    Attends Religious Services: Never    Active Member of Clubs or Organizations: No    Attends Banker Meetings: Never    Marital Status: Divorced  Catering manager Violence: Not At Risk (10/30/2022)   Humiliation, Afraid, Rape, and Kick questionnaire    Fear of Current or Ex-Partner: No    Emotionally Abused: No    Physically Abused: No    Sexually Abused: No    Vital Signs: Blood pressure 125/60, pulse 76, temperature 97.8 F (36.6 C), resp. rate 16, height 5\' 2"  (1.575 m), weight 180 lb 9.6 oz (81.9 kg), SpO2 94%.  Examination: General Appearance: The patient is well-developed,  well-nourished, and in no distress. Skin: Gross inspection of skin unremarkable. Head: normocephalic, no gross deformities. Eyes: no gross deformities noted. ENT: ears appear grossly normal no exudates. Neck: Supple. No thyromegaly. No LAD. Respiratory: a few rhonchi. Cardiovascular: Normal S1 and S2 without murmur or rub. Extremities: No cyanosis. pulses are equal. Neurologic: Alert and oriented. No involuntary movements.  LABS: Recent Results (from the past 2160 hour(s))  CBC with Differential/Platelet     Status: Abnormal   Collection Time: 09/14/23  2:43 PM  Result Value Ref Range   WBC 7.3 3.4 - 10.8 x10E3/uL   RBC 3.53 (L) 3.77 - 5.28 x10E6/uL   Hemoglobin 10.6 (L) 11.1 - 15.9 g/dL   Hematocrit 16.1 (L) 09.6 - 46.6 %  MCV 94 79 - 97 fL   MCH 30.0 26.6 - 33.0 pg   MCHC 31.9 31.5 - 35.7 g/dL   RDW 16.1 09.6 - 04.5 %   Platelets 342 150 - 450 x10E3/uL   Neutrophils 69 Not Estab. %   Lymphs 20 Not Estab. %   Monocytes 7 Not Estab. %   Eos 3 Not Estab. %   Basos 1 Not Estab. %   Neutrophils Absolute 5.0 1.4 - 7.0 x10E3/uL   Lymphocytes Absolute 1.5 0.7 - 3.1 x10E3/uL   Monocytes Absolute 0.5 0.1 - 0.9 x10E3/uL   EOS (ABSOLUTE) 0.2 0.0 - 0.4 x10E3/uL   Basophils Absolute 0.1 0.0 - 0.2 x10E3/uL   Immature Granulocytes 0 Not Estab. %   Immature Grans (Abs) 0.0 0.0 - 0.1 x10E3/uL  Urine Culture     Status: Abnormal   Collection Time: 09/14/23  2:48 PM  Result Value Ref Range   Urine Culture, Routine Final report (A)    Organism ID, Bacteria Comment (A)     Comment: Pseudomonas aeruginosa 10,000-25,000 colony forming units per mL    Antimicrobial Susceptibility Comment     Comment:       ** S = Susceptible; I = Intermediate; R = Resistant **                    P = Positive; N = Negative             MICS are expressed in micrograms per mL    Antibiotic                 RSLT#1    RSLT#2    RSLT#3    RSLT#4 Amikacin                       S Cefepime                        S Ceftazidime                    S Ciprofloxacin                  S Gentamicin                     S Imipenem                       S Levofloxacin                   S Meropenem                      S Piperacillin                   S Ticarcillin                    S Tobramycin                     S   POCT urinalysis dipstick     Status: Abnormal   Collection Time: 09/14/23  2:49 PM  Result Value Ref Range   Color, UA dark yellow    Clarity, UA cloudy    Glucose, UA Negative Negative   Bilirubin, UA neg    Ketones, UA neg    Spec Grav, UA 1.010 1.010 - 1.025   Blood,  UA neg    pH, UA 6.0 5.0 - 8.0   Protein, UA Negative Negative   Urobilinogen, UA 0.2 0.2 or 1.0 E.U./dL   Nitrite, UA neg    Leukocytes, UA Large (3+) (A) Negative   Appearance     Odor    CBC with Differential/Platelet     Status: Abnormal   Collection Time: 09/21/23  2:57 PM  Result Value Ref Range   WBC 7.9 3.4 - 10.8 x10E3/uL   RBC 3.66 (L) 3.77 - 5.28 x10E6/uL   Hemoglobin 11.1 11.1 - 15.9 g/dL   Hematocrit 04.5 40.9 - 46.6 %   MCV 93 79 - 97 fL   MCH 30.3 26.6 - 33.0 pg   MCHC 32.6 31.5 - 35.7 g/dL   RDW 81.1 91.4 - 78.2 %   Platelets 334 150 - 450 x10E3/uL   Neutrophils 64 Not Estab. %   Lymphs 23 Not Estab. %   Monocytes 8 Not Estab. %   Eos 3 Not Estab. %   Basos 1 Not Estab. %   Neutrophils Absolute 5.1 1.4 - 7.0 x10E3/uL   Lymphocytes Absolute 1.8 0.7 - 3.1 x10E3/uL   Monocytes Absolute 0.6 0.1 - 0.9 x10E3/uL   EOS (ABSOLUTE) 0.2 0.0 - 0.4 x10E3/uL   Basophils Absolute 0.1 0.0 - 0.2 x10E3/uL   Immature Granulocytes 1 Not Estab. %   Immature Grans (Abs) 0.1 0.0 - 0.1 x10E3/uL  Renal Function Panel     Status: Abnormal   Collection Time: 09/21/23  2:57 PM  Result Value Ref Range   Glucose 100 (H) 70 - 99 mg/dL   BUN 16 8 - 27 mg/dL   Creatinine, Ser 9.56 0.57 - 1.00 mg/dL   eGFR 77 >21 HY/QMV/7.84   BUN/Creatinine Ratio 21 12 - 28   Sodium 139 134 - 144 mmol/L   Potassium 4.4 3.5 - 5.2  mmol/L   Chloride 102 96 - 106 mmol/L   CO2 23 20 - 29 mmol/L   Calcium 9.6 8.7 - 10.3 mg/dL   Phosphorus 2.9 (L) 3.0 - 4.3 mg/dL   Albumin 4.6 3.7 - 4.7 g/dL    Radiology: MM 3D SCREENING MAMMOGRAM BILATERAL BREAST  Result Date: 06/23/2023 CLINICAL DATA:  Screening. EXAM: DIGITAL SCREENING BILATERAL MAMMOGRAM WITH TOMOSYNTHESIS AND CAD TECHNIQUE: Bilateral screening digital craniocaudal and mediolateral oblique mammograms were obtained. Bilateral screening digital breast tomosynthesis was performed. The images were evaluated with computer-aided detection. COMPARISON:  Previous exam(s). ACR Breast Density Category b: There are scattered areas of fibroglandular density. FINDINGS: There are no findings suspicious for malignancy. IMPRESSION: No mammographic evidence of malignancy. A result letter of this screening mammogram will be mailed directly to the patient. RECOMMENDATION: Screening mammogram in one year. (Code:SM-B-01Y) BI-RADS CATEGORY  1: Negative. Electronically Signed   By: Sherian Rein M.D.   On: 06/23/2023 13:25    No results found.  No results found.  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 OSA on CPAP  Continue with PAP therapy she has had good results so far  2. Obstructive chronic bronchitis without exacerbation (HCC) - Budeson-Glycopyrrol-Formoterol (BREZTRI AEROSPHERE) 160-9-4.8 MCG/ACT AERO; Inhale 2 puffs into the lungs 2 (two) times daily.  Dispense: 10.7 g; Refill: 11 - ipratropium-albuterol (DUONEB) 0.5-2.5 (3) MG/3ML SOLN; Take 3 mLs by nebulization every 6 (six) hours as needed.  Dispense: 360 mL; Refill: 4  3. Diaphragm dysfunction  Nocturnal positive airway pressure and she is doing fine there has been no worsening of symptoms  4. Shortness of breath  She is at baseline currently   General Counseling: I have discussed the findings of the evaluation and examination with [place].  I have also discussed any further diagnostic evaluation thatmay be needed or ordered today. Jonell verbalizes understanding of the findings of todays visit. We also reviewed her medications today and discussed drug interactions and side effects including but not limited excessive drowsiness and altered mental states. We also discussed that there is always a risk not just to her but also people around her. she has been encouraged to call the office with any questions or concerns that should arise related to todays visit.  No orders of the defined types were placed in this encounter.    Time spent: 60  I have personally obtained a history, examined the patient, evaluated laboratory and imaging results, formulated the assessment and plan and placed orders.    Yevonne Pax, MD [facility] Pulmonary and Critical Care Sleep medicine

## 2023-10-19 ENCOUNTER — Other Ambulatory Visit: Payer: Self-pay | Admitting: Physician Assistant

## 2023-10-19 DIAGNOSIS — J4489 Other specified chronic obstructive pulmonary disease: Secondary | ICD-10-CM

## 2023-10-20 ENCOUNTER — Ambulatory Visit: Payer: Self-pay | Admitting: *Deleted

## 2023-10-20 ENCOUNTER — Encounter: Payer: Self-pay | Admitting: Physician Assistant

## 2023-10-20 ENCOUNTER — Ambulatory Visit (INDEPENDENT_AMBULATORY_CARE_PROVIDER_SITE_OTHER): Payer: Medicare Other | Admitting: Physician Assistant

## 2023-10-20 VITALS — BP 138/68 | HR 82 | Temp 98.3°F | Ht 62.0 in | Wt 181.0 lb

## 2023-10-20 DIAGNOSIS — J440 Chronic obstructive pulmonary disease with acute lower respiratory infection: Secondary | ICD-10-CM | POA: Diagnosis not present

## 2023-10-20 DIAGNOSIS — J209 Acute bronchitis, unspecified: Secondary | ICD-10-CM | POA: Diagnosis not present

## 2023-10-20 DIAGNOSIS — R011 Cardiac murmur, unspecified: Secondary | ICD-10-CM | POA: Diagnosis not present

## 2023-10-20 MED ORDER — BENZONATATE 200 MG PO CAPS: 200.0000 mg | ORAL_CAPSULE | Freq: Two times a day (BID) | ORAL | 0 refills | Status: DC | PRN

## 2023-10-20 MED ORDER — GUAIFENESIN-DM 100-10 MG/5ML PO SYRP: 10.0000 mL | ORAL_SOLUTION | ORAL | 0 refills | Status: DC | PRN

## 2023-10-20 NOTE — Telephone Encounter (Signed)
  Chief Complaint: Non productive cough for 2 weeks.    Has COPD. Symptoms: above.   "It's a hard deep cough but nothing is coming up" Frequency: For the last 2 weeks.   Pertinent Negatives: Patient denies Fever, nasal congestion, sore throat.   Disposition: [] ED /[] Urgent Care (no appt availability in office) / [x] Appointment(In office/virtual)/ []  [place] [facility]/ [] Home Care/ [] Refused Recommended Disposition /[] [facility]/ []  Follow-up with PCP Additional Notes: Appt made with Tillie Fantasia, PA for today at 3:40.   No openings with Dr. Yetta Barre.

## 2023-10-20 NOTE — Progress Notes (Signed)
Date:  10/20/2023   Name:  Regina Macdonald   DOB:  1941-09-04   MRN:  [id number]   Chief Complaint: Cough  Cough This is a new problem. Episode onset: X 2 weeks. The problem has been gradually improving. Episode frequency: comes and goes. The cough is Non-productive. Nothing aggravates the symptoms. She has tried nothing for the symptoms. Her past medical history is significant for COPD.   Regina Macdonald is a very pleasant 82 y.o. female with COPD new to me today for evaluation of productive cough for two weeks, thinks she got it from the cold weather. Does not often have COPD exacerbations. No home O2, no nebulizer, but she does use nasal CPAP for OSA.  Per her report, she seems to have improved over the last 72h.  She has not used any OTC products for fear of interaction with her prescription medications.   Medication list has been reviewed and updated.  Current Meds  Medication Sig   acetaminophen (TYLENOL) 500 MG tablet Take 500 mg by mouth every 6 (six) hours as needed.   albuterol (VENTOLIN HFA) 108 (90 Base) MCG/ACT inhaler INHALE 2 PUFFS INTO THE LUNGS EVERY 6 HOURS AS NEEDED FOR WHEEZING OR SHORTNESS OF BREATH   aspirin 81 MG chewable tablet Chew 81 mg by mouth daily.   benzonatate (TESSALON) 200 MG capsule Take 1 capsule (200 mg total) by mouth 2 (two) times daily as needed for cough.   Budeson-Glycopyrrol-Formoterol (BREZTRI AEROSPHERE) 160-9-4.8 MCG/ACT AERO Inhale 2 puffs into the lungs 2 (two) times daily.   CALCIUM & MAGNESIUM CARBONATES PO Take 1 tablet by mouth daily.   EQUATE STOOL SOFTENER 100 MG capsule Take 1 capsule by mouth twice daily   FLUoxetine (PROZAC) 10 MG capsule Take 1 capsule (10 mg total) by mouth daily.   FLUoxetine (PROZAC) 20 MG capsule Take 1 capsule (20 mg total) by mouth daily.   fluticasone (FLONASE) 50 MCG/ACT nasal spray SHAKE LIQUID AND USE 2 SPRAYS IN EACH NOSTRIL DAILY   gemfibrozil (LOPID) 600 MG tablet TAKE 1 TABLET BY MOUTH TWICE DAILY    guaiFENesin-dextromethorphan (ROBITUSSIN DM) 100-10 MG/5ML syrup Take 10 mLs by mouth every 4 (four) hours as needed for cough.   ibuprofen (ADVIL) 200 MG tablet Take 400 mg by mouth every 6 (six) hours as needed for moderate pain.   ipratropium-albuterol (DUONEB) 0.5-2.5 (3) MG/3ML SOLN Take 3 mLs by nebulization every 6 (six) hours as needed.   loratadine (EQ LORATADINE) 10 MG tablet Take 1 tablet (10 mg total) by mouth daily.   Multiple Vitamins-Minerals (CENTRUM SILVER PO) Take 1 capsule by mouth daily.   pantoprazole (PROTONIX) 40 MG tablet Take 1 tablet (40 mg total) by mouth daily.   polyethylene glycol (MIRALAX) packet Take 17 g by mouth daily.   [DISCONTINUED] doxycycline (VIBRA-TABS) 100 MG tablet Take 1 tablet (100 mg total) by mouth 2 (two) times daily.     Review of Systems  Respiratory:  Positive for cough.     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   Anxiety 03/23/2018   Taking medication for chronic disease 03/23/2018   Other constipation 12/13/2017   Depression with anxiety 08/19/2017   Hyperlipidemia 08/19/2017   Gastroesophageal reflux disease without esophagitis  08/19/2017   Other chest pain 04/16/2015   COPD exacerbation (HCC) 04/16/2015   Sleep apnea 04/16/2015   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   Asthmatic bronchitis 04/16/2015   Abdominal pain, LLQ 03/29/2014   Rectal bleeding 03/29/2014   Arthritis, 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   Osteoarthrosis, unspecified whether generalized or localized, pelvic region and thigh 03/26/2014    Allergies  Allergen Reactions   Ciprofloxacin Other (See Comments)   Etodolac Other (See Comments)   Tramadol Other (See Comments)    Slurred speech/ confusion    Sulfa Antibiotics Rash    Immunization History  Administered Date(s) Administered   Fluad Quad(high Dose 65+) 07/31/2019, 09/05/2021, 07/31/2022, 08/06/2023   Influenza Inj Mdck Quad Pf 08/19/2020   Influenza, High Dose Seasonal PF 08/16/2017, 08/07/2018   Influenza,inj,Quad PF,6+ Mos 09/09/2015   Influenza-Unspecified 07/24/2014, 08/07/2018, 08/26/2020   PFIZER(Purple Top)SARS-COV-2 Vaccination 01/17/2020, 02/07/2020, 10/11/2020   PNEUMOCOCCAL CONJUGATE-20 04/02/2022   Pneumococcal Conjugate-13 09/09/2015   Pneumococcal Polysaccharide-23 02/09/2013   Respiratory Syncytial Virus Vaccine,Recomb Aduvanted(Arexvy) 08/06/2023   Tdap 08/10/2011, 07/29/2021, 04/02/2022   Unspecified SARS-COV-2 Vaccination 08/06/2023   Zoster Recombinant(Shingrix) 04/02/2022   Zoster, Live 02/27/2015    Past Surgical History:  Procedure Laterality Date   ABDOMINAL HYSTERECTOMY     COLONOSCOPY  06/08/2013   Dr Bluford Kaufmann- small mouth diverticula   HIP SURGERY Left    KNEE SURGERY Bilateral    NASAL RECONSTRUCTION     x 2    Social History   Tobacco Use   Smoking status: Former   Smokeless tobacco: Never  Vaping Use   Vaping status: Never Used  Substance Use Topics   Alcohol use: Not Currently   Drug use: Never    Family History  Problem Relation Age of Onset   Cirrhosis Father    Coronary artery disease Mother    Breast cancer Neg Hx         10/20/2023    3:34 PM 09/14/2023    1:44 PM 07/15/2023    3:05 PM 04/01/2023   11:01 AM  GAD 7 : Generalized Anxiety Score  Nervous, Anxious, on Edge 0 0 0 0  Control/stop worrying 1 0 0 0  Worry too much - different things 1 0 0 0  Trouble relaxing 0 0 0 0  Restless 0 0 0 0  Easily annoyed or irritable 0 0 0 0  Afraid - awful might happen 0 0 0 0  Total GAD 7 Score 2 0 0 0  Anxiety Difficulty Not difficult at all Not difficult at all Not difficult at all Not difficult at all       10/20/2023    3:34 PM 09/14/2023    1:44 PM 07/15/2023    3:05  PM  Depression screen PHQ 2/9  Decreased Interest 0 1 0  Down, Depressed, Hopeless 0 0 0  PHQ - 2 Score 0 1 0  Altered sleeping 0 0 0  Tired, decreased energy 0 1 0  Change in appetite 0 1 0  Feeling bad or failure about yourself  0 1 0  Trouble concentrating 0 0 0  Moving slowly or fidgety/restless 0 0 0  Suicidal thoughts 0 0 0  PHQ-9 Score 0 4 0  Difficult doing work/chores Not difficult at all Not difficult at all Not difficult at all    BP Readings from Last 3 Encounters:  10/20/23 138/68  10/11/23 125/60  09/21/23 120/78    Wt Readings from Last 3 Encounters:  10/20/23 181 lb (82.1 kg)  10/11/23 180 lb 9.6 oz (81.9 kg)  09/21/23 179 lb (81.2 kg)    BP 138/68   Pulse 82   Temp 98.3 F (36.8 C) (Oral)   Ht 5\' 2"  (1.575 m)   Wt 181 lb (82.1 kg)   SpO2 97%   BMI 33.11 kg/m   Physical Exam Vitals and nursing note reviewed.  Constitutional:      Appearance: Normal appearance.  Cardiovascular:     Rate and Rhythm: Normal rate and regular rhythm.     Heart sounds: Murmur heard.     Systolic murmur is present with a grade of 3/6.     No friction rub. No gallop.  Pulmonary:     Effort: Pulmonary effort is normal.     Breath sounds: Normal breath sounds.  Abdominal:     General: There is no distension.  Musculoskeletal:        General: Normal range of motion.  Skin:    General: Skin is warm and dry.  Neurological:     Mental Status: She is alert and oriented to person, place, and time.     Gait: Gait is intact.  Psychiatric:        Mood and Affect: Mood and affect normal.     Recent Labs     Component Value Date/Time   NA 139 09/21/2023 1457   NA 139 12/31/2013 2020   K 4.4 09/21/2023 1457   K 3.7 12/31/2013 2020   CL 102 09/21/2023 1457   CL 105 12/31/2013 2020   CO2 23 09/21/2023 1457   CO2 30 12/31/2013 2020   GLUCOSE 100 (H) 09/21/2023 1457   GLUCOSE 102 (H) 07/21/2022 1213   GLUCOSE 99 12/31/2013 2020   BUN 16 09/21/2023 1457   BUN 14  12/31/2013 2020   CREATININE 0.76 09/21/2023 1457   CREATININE 0.75 12/31/2013 2020   CALCIUM 9.6 09/21/2023 1457   CALCIUM 8.5 12/31/2013 2020   PROT 7.1 04/01/2023 1145   ALBUMIN 4.6 09/21/2023 1457   AST 23 04/01/2023 1145   ALT 13 04/01/2023 1145   ALKPHOS 123 (H) 04/01/2023 1145   BILITOT 0.3 04/01/2023 1145   GFRNONAA >60 07/21/2022 1213   GFRNONAA >60 12/31/2013 2020   GFRAA 98 12/20/2019 1055   GFRAA >60 12/31/2013 2020    Lab Results  Component Value Date   WBC 7.9 09/21/2023   HGB 11.1 09/21/2023   HCT 34.1 09/21/2023   MCV 93 09/21/2023   PLT 334 09/21/2023   Lab Results  Component Value Date   HGBA1C 5.7 (H) 12/02/2015   Lab Results  Component Value Date   CHOL 143 04/01/2023   HDL 45 04/01/2023   LDLCALC 81 04/01/2023   TRIG 90 04/01/2023   CHOLHDL 3.0 08/19/2017   Lab Results  Component Value Date   TSH 3.16 01/24/2021     Assessment and Plan:  1. COPD with acute bronchitis (HCC) Patient reassured this seems to be acute bronchitis in the context of COPD.  Per her report, she seems to be improving over the last 72 hours without prescription or OTC medication.  We discussed management options and through shared decision making decided to proceed with conservative management using benzonatate and Robitussin DM as below.  Discussed option for prednisone +/- doxycycline but will hold off at this time unless she begins to decline in the next 24 to 48 hours.  - benzonatate (TESSALON) 200 MG capsule; Take 1 capsule (200 mg total) by mouth 2 (two) times  daily as needed for cough.  Dispense: 20 capsule; Refill: 0 - guaiFENesin-dextromethorphan (ROBITUSSIN DM) 100-10 MG/5ML syrup; Take 10 mLs by mouth every 4 (four) hours as needed for cough.  Dispense: 118 mL; Refill: 0  2. Systolic murmur Apparently new. 3/6 intensity.  Patient unaware of any murmur.  Echo from 2020 significant only for mild MR and TR.  Patient has dyspnea on exertion at baseline presumably due  to COPD.  I will notify her PCP of today's finding.   Return if symptoms worsen or fail to improve.    Alvester Morin, PA-C, DMSc, Nutritionist [facility] [phone]

## 2023-10-20 NOTE — Telephone Encounter (Signed)
Message from [place] T sent at 10/20/2023  1:26 PM EST  Summary: cough   Patient called stated she has a cough with no mucus coming up and she needs to know what she can take without coming into the office to be seen. Please f/u with patient          Call History  Contact Date/Time Type Contact Phone/Fax User  10/20/2023 01:24 PM EST Phone (Incoming) Wayne, Stefanick (Self) [phone] Judie Petit) Elon Jester   Reason for Disposition  [1] Continuous (nonstop) coughing interferes with work or school AND [2] no improvement using cough treatment per Care Advice  Answer Assessment - Initial Assessment Questions 1. ONSET: "When did the cough begin?"      A couple of weeks ago it started.     My glands under my chin are swollen.   I'm sneezing.   Do I need to come in?   I have not tried any OTC medicines.   I'm not sure what is compatible with my medications.    My nose was running after I sneezed.   I have the bad cough.    It's not in my chest.     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    2. SEVERITY: "How bad is the cough today?"      It's bad 3. SPUTUM: "Describe the color of your sputum" (none, dry cough; clear, white, yellow, green)     Non coming up 4. HEMOPTYSIS: "Are you coughing up any blood?" If so ask: "How much?" (flecks, streaks, tablespoons, etc.)     Not asked 5. DIFFICULTY BREATHING: "Are you having difficulty breathing?" If Yes, ask: "How bad is it?" (e.g., mild, moderate, severe)    - MILD: No SOB at rest, mild SOB with walking, speaks normally in sentences, can lie down, no retractions, pulse < 100.    - MODERATE: SOB at rest, SOB with minimal exertion and prefers to sit, cannot lie down flat, speaks in phrases, mild retractions, audible wheezing, pulse 100-120.    - SEVERE: Very SOB at rest, speaks in single words, struggling to breathe, sitting hunched forward, retractions, pulse > 120      No shortness of breath 6. FEVER: "Do you have a fever?" If Yes, ask: "What is your temperature, how was  it measured, and when did it start?"     No 7. CARDIAC HISTORY: "Do you have any history of heart disease?" (e.g., heart attack, congestive heart failure)      No 8. LUNG HISTORY: "Do you have any history of lung disease?"  (e.g., pulmonary embolus, asthma, emphysema)     COPD 9. PE RISK FACTORS: "Do you have a history of blood clots?" (or: recent major surgery, recent prolonged travel, bedridden)     Not asked 10. OTHER SYMPTOMS: "Do you have any other symptoms?" (e.g., runny nose, wheezing, chest pain)       Coughing a lot but has COPD 11. PREGNANCY: "Is there any chance you are pregnant?" "When was your last menstrual period?"       N/A due to age 10. TRAVEL: "Have you traveled out of the country in the last month?" (e.g., travel history, exposures)       Not asked  Protocols used: Cough - Acute Non-Productive-A-AH

## 2023-10-25 ENCOUNTER — Other Ambulatory Visit: Payer: Self-pay

## 2023-10-25 DIAGNOSIS — K219 Gastro-esophageal reflux disease without esophagitis: Secondary | ICD-10-CM

## 2023-10-25 MED ORDER — PANTOPRAZOLE SODIUM 40 MG PO TBEC: 40.0000 mg | DELAYED_RELEASE_TABLET | Freq: Every day | ORAL | 1 refills | Status: DC

## 2023-11-03 ENCOUNTER — Telehealth: Payer: Self-pay | Admitting: Family Medicine

## 2023-11-03 NOTE — Telephone Encounter (Signed)
Copied from CRM [id number]. Topic: Appointment Scheduling - Scheduling Inquiry for Clinic >> Nov 03, 2023  1:54 PM Marlow Baars wrote: Reason for CRM: The patient called in stating she is so sorry she overslept for her AWV this morning. Please call her back to reschedule for a later date

## 2023-11-05 ENCOUNTER — Ambulatory Visit: Payer: Medicare Other | Admitting: Internal Medicine

## 2023-11-07 NOTE — Procedures (Signed)
[facility] [address] [place], [address]    Complete Pulmonary Function Testing Interpretation:  FINDINGS:  The forced vital capacity is moderately decreased.  FEV1 is 1.21 L which is 69% of predicted and is mildly decreased.  F1 FVC ratio is normal.  Postbronchodilator no significant change in FEV1 is noted.  Total lung capacity is moderately decreased residual volume is decreased FRC is decreased DLCO was within normal limits  IMPRESSION:  This pulmonary function study is consistent with moderate restrictive lung disease clinical correlation is recommended  Yevonne Pax, MD [facility] Pulmonary Critical Care Medicine Sleep Medicine

## 2023-11-29 ENCOUNTER — Other Ambulatory Visit: Payer: Self-pay | Admitting: Family Medicine

## 2023-11-29 DIAGNOSIS — K219 Gastro-esophageal reflux disease without esophagitis: Secondary | ICD-10-CM

## 2023-11-30 NOTE — Telephone Encounter (Signed)
 Requested Prescriptions  Pending Prescriptions Disp Refills   pantoprazole  (PROTONIX ) 40 MG tablet [Pharmacy Med Name: PANTOPRAZOLE  40MG  TABLETS] 30 tablet 1    Sig: TAKE 1 TABLET BY MOUTH DAILY     Gastroenterology: Proton Pump Inhibitors Passed - 11/30/2023  2:58 PM      Passed - Valid encounter within last 12 months    Recent Outpatient Visits           1 month ago COPD with acute bronchitis (HCC)   [place] [facility] at [facility], [place], [place]   2 months ago Abdominal pain, LLQ   [place] [facility], [place]   2 months ago Abdominal pain, LLQ   [place] [facility] at [facility], [place]   4 months ago Elevated blood pressure, situational   [place] [facility] at [facility], [place]   8 months ago Hyperlipidemia, unspecified hyperlipidemia type   [facility] at [facility], MD

## 2023-12-02 ENCOUNTER — Other Ambulatory Visit: Payer: Self-pay

## 2023-12-02 DIAGNOSIS — F320 Major depressive disorder, single episode, mild: Secondary | ICD-10-CM

## 2023-12-02 DIAGNOSIS — F419 Anxiety disorder, unspecified: Secondary | ICD-10-CM

## 2023-12-02 MED ORDER — FLUOXETINE HCL 20 MG PO CAPS: 20.0000 mg | ORAL_CAPSULE | Freq: Every day | ORAL | 2 refills | Status: DC

## 2023-12-10 ENCOUNTER — Encounter: Payer: Self-pay | Admitting: Family Medicine

## 2023-12-10 ENCOUNTER — Ambulatory Visit: Payer: Medicare Other | Admitting: Family Medicine

## 2023-12-10 ENCOUNTER — Other Ambulatory Visit: Payer: Self-pay

## 2023-12-10 VITALS — BP 124/78 | HR 98 | Temp 98.1°F | Ht 62.0 in | Wt 179.0 lb

## 2023-12-10 DIAGNOSIS — J441 Chronic obstructive pulmonary disease with (acute) exacerbation: Secondary | ICD-10-CM

## 2023-12-10 DIAGNOSIS — J4489 Other specified chronic obstructive pulmonary disease: Secondary | ICD-10-CM

## 2023-12-10 DIAGNOSIS — R0602 Shortness of breath: Secondary | ICD-10-CM

## 2023-12-10 DIAGNOSIS — R6889 Other general symptoms and signs: Secondary | ICD-10-CM | POA: Diagnosis not present

## 2023-12-10 DIAGNOSIS — R058 Other specified cough: Secondary | ICD-10-CM | POA: Diagnosis not present

## 2023-12-10 LAB — POCT INFLUENZA A/B
Influenza A, POC: NEGATIVE
Influenza B, POC: NEGATIVE

## 2023-12-10 LAB — POC COVID19 BINAXNOW: SARS Coronavirus 2 Ag: NEGATIVE

## 2023-12-10 MED ORDER — IPRATROPIUM-ALBUTEROL 0.5-2.5 (3) MG/3ML IN SOLN: 3.0000 mL | Freq: Four times a day (QID) | RESPIRATORY_TRACT | 4 refills | Status: DC | PRN

## 2023-12-10 NOTE — Progress Notes (Signed)
Date:  12/10/2023   Name:  Regina Macdonald   DOB:  Oct 19, 1941   MRN:  [id number]   Chief Complaint: Cough (Started a month ago. Cough, sneezing, runny nose. SOB. No fever. )  Cough This is a recurrent problem. The current episode started more than 1 month ago. The problem has been waxing and waning. The cough is Non-productive. Pertinent negatives include no chest pain, chills, ear congestion, ear pain, fever, headaches, heartburn, hemoptysis, myalgias, nasal congestion, postnasal drip, rash, rhinorrhea, sore throat, shortness of breath, sweats, weight loss or wheezing. Nothing aggravates the symptoms. The treatment provided mild relief.    Lab Results  Component Value Date   NA 139 09/21/2023   K 4.4 09/21/2023   CO2 23 09/21/2023   GLUCOSE 100 (H) 09/21/2023   BUN 16 09/21/2023   CREATININE 0.76 09/21/2023   CALCIUM 9.6 09/21/2023   EGFR 77 09/21/2023   GFRNONAA >60 07/21/2022   Lab Results  Component Value Date   CHOL 143 04/01/2023   HDL 45 04/01/2023   LDLCALC 81 04/01/2023   TRIG 90 04/01/2023   CHOLHDL 3.0 08/19/2017   Lab Results  Component Value Date   TSH 3.16 01/24/2021   Lab Results  Component Value Date   HGBA1C 5.7 (H) 12/02/2015   Lab Results  Component Value Date   WBC 7.9 09/21/2023   HGB 11.1 09/21/2023   HCT 34.1 09/21/2023   MCV 93 09/21/2023   PLT 334 09/21/2023   Lab Results  Component Value Date   ALT 13 04/01/2023   AST 23 04/01/2023   ALKPHOS 123 (H) 04/01/2023   BILITOT 0.3 04/01/2023   No results found for: "25OHVITD2", "25OHVITD3", "VD25OH"   Review of Systems  Constitutional:  Negative for chills, fever and weight loss.  HENT:  Negative for ear pain, postnasal drip, rhinorrhea and sore throat.   Respiratory:  Positive for cough. Negative for hemoptysis, choking, shortness of breath, wheezing and stridor.   Cardiovascular:  Negative for chest pain and palpitations.  Gastrointestinal:  Negative for heartburn.   Musculoskeletal:  Negative for myalgias.  Skin:  Negative for rash.  Neurological:  Negative for headaches.    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   Anxiety 03/23/2018   Taking medication for chronic disease 03/23/2018   Other constipation 12/13/2017   Depression with anxiety 08/19/2017   Hyperlipidemia 08/19/2017   Gastroesophageal reflux disease without esophagitis 08/19/2017   Other chest pain 04/16/2015   COPD exacerbation (HCC) 04/16/2015   Sleep apnea 04/16/2015   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   Asthmatic bronchitis 04/16/2015   Abdominal pain, LLQ 03/29/2014   Rectal bleeding 03/29/2014   Arthritis, 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   Osteoarthrosis, unspecified whether generalized or localized, pelvic region and thigh 03/26/2014    Allergies  Allergen Reactions   Ciprofloxacin Other (See Comments)   Etodolac Other (See Comments)   Tramadol Other (See Comments)    Slurred speech/ confusion   Sulfa Antibiotics Rash    Past Surgical History:  Procedure Laterality Date   ABDOMINAL HYSTERECTOMY     COLONOSCOPY  06/08/2013   Dr Bluford Kaufmann- small mouth diverticula   HIP SURGERY Left    KNEE SURGERY Bilateral    NASAL RECONSTRUCTION  x 2    Social History   Tobacco Use   Smoking status: Former   Smokeless tobacco: Never  Vaping Use   Vaping status: Never Used  Substance Use Topics   Alcohol use: Not Currently   Drug use: Never     Medication list has been reviewed and updated.  Current Meds  Medication Sig   acetaminophen (TYLENOL) 500 MG tablet Take 500 mg by mouth every 6 (six) hours as needed.   albuterol (VENTOLIN HFA) 108 (90 Base) MCG/ACT inhaler INHALE 2 PUFFS INTO THE LUNGS EVERY 6  HOURS AS NEEDED FOR WHEEZING OR SHORTNESS OF BREATH   aspirin 81 MG chewable tablet Chew 81 mg by mouth daily.   Budeson-Glycopyrrol-Formoterol (BREZTRI AEROSPHERE) 160-9-4.8 MCG/ACT AERO Inhale 2 puffs into the lungs 2 (two) times daily.   CALCIUM & MAGNESIUM CARBONATES PO Take 1 tablet by mouth daily.   EQUATE STOOL SOFTENER 100 MG capsule Take 1 capsule by mouth twice daily   FLUoxetine (PROZAC) 10 MG capsule Take 1 capsule (10 mg total) by mouth daily.   FLUoxetine (PROZAC) 20 MG capsule Take 1 capsule (20 mg total) by mouth daily.   fluticasone (FLONASE) 50 MCG/ACT nasal spray SHAKE LIQUID AND USE 2 SPRAYS IN EACH NOSTRIL DAILY   gemfibrozil (LOPID) 600 MG tablet TAKE 1 TABLET BY MOUTH TWICE DAILY   ibuprofen (ADVIL) 200 MG tablet Take 400 mg by mouth every 6 (six) hours as needed for moderate pain.   ipratropium-albuterol (DUONEB) 0.5-2.5 (3) MG/3ML SOLN Take 3 mLs by nebulization every 6 (six) hours as needed.   loratadine (EQ LORATADINE) 10 MG tablet Take 1 tablet (10 mg total) by mouth daily.   Multiple Vitamins-Minerals (CENTRUM SILVER PO) Take 1 capsule by mouth daily.   pantoprazole (PROTONIX) 40 MG tablet Take 1 tablet (40 mg total) by mouth daily.   polyethylene glycol (MIRALAX) packet Take 17 g by mouth daily.       12/10/2023    8:42 AM 10/20/2023    3:34 PM 09/14/2023    1:44 PM 07/15/2023    3:05 PM  GAD 7 : Generalized Anxiety Score  Nervous, Anxious, on Edge 2 0 0 0  Control/stop worrying 2 1 0 0  Worry too much - different things 2 1 0 0  Trouble relaxing 0 0 0 0  Restless 2 0 0 0  Easily annoyed or irritable 0 0 0 0  Afraid - awful might happen 0 0 0 0  Total GAD 7 Score 8 2 0 0  Anxiety Difficulty Somewhat difficult Not difficult at all Not difficult at all Not difficult at all       12/10/2023    8:42 AM 10/20/2023    3:34 PM 09/14/2023    1:44 PM  Depression screen PHQ 2/9  Decreased Interest 0 0 1  Down, Depressed, Hopeless 0 0 0  PHQ - 2 Score 0 0 1   Altered sleeping 0 0 0  Tired, decreased energy 0 0 1  Change in appetite 0 0 1  Feeling bad or failure about yourself  0 0 1  Trouble concentrating 0 0 0  Moving slowly or fidgety/restless 3 0 0  Suicidal thoughts 0 0 0  PHQ-9 Score 3 0 4  Difficult doing work/chores Not difficult at all Not difficult at all Not difficult at all    BP Readings from Last 3 Encounters:  12/10/23 124/78  10/20/23 138/68  10/11/23 125/60    Physical Exam Vitals and  nursing note reviewed.  Constitutional:      General: She is not in acute distress.    Appearance: She is not diaphoretic.  HENT:     Head: Normocephalic and atraumatic.     Right Ear: External ear normal.     Left Ear: External ear normal.     Nose: Nose normal.  Eyes:     General:        Right eye: No discharge.        Left eye: No discharge.     Conjunctiva/sclera: Conjunctivae normal.     Pupils: Pupils are equal, round, and reactive to light.  Neck:     Thyroid: No thyromegaly.     Vascular: No JVD.  Cardiovascular:     Rate and Rhythm: Normal rate and regular rhythm.     Heart sounds: Normal heart sounds. No murmur heard.    No friction rub. No gallop.  Pulmonary:     Effort: Pulmonary effort is normal.     Breath sounds: Normal breath sounds. No wheezing, rhonchi or rales.  Abdominal:     General: Bowel sounds are normal.     Palpations: Abdomen is soft. There is no mass.     Tenderness: There is no abdominal tenderness. There is no guarding.  Musculoskeletal:        General: Normal range of motion.     Cervical back: Normal range of motion and neck supple.  Lymphadenopathy:     Cervical: No cervical adenopathy.  Skin:    General: Skin is warm and dry.  Neurological:     Mental Status: She is alert.     Deep Tendon Reflexes: Reflexes are normal and symmetric.     Wt Readings from Last 3 Encounters:  12/10/23 179 lb (81.2 kg)  10/20/23 181 lb (82.1 kg)  10/11/23 180 lb 9.6 oz (81.9 kg)    BP 124/78    Pulse 98   Temp 98.1 F (36.7 C) (Oral)   Ht 5\' 2"  (1.575 m)   Wt 179 lb (81.2 kg)   SpO2 99%   BMI 32.74 kg/m   Assessment and Plan: 1. Flu-like symptoms (Primary) Onset 2 months ago around the first week of December started having cough with nonproductive nature.  Patient has had some throat clearing with some mild shortness of breath.  Examination is consistent with reactivity in the airways. y. - POC COVID-19 BinaxNow - POCT Influenza A/B  2. COPD exacerbation (HCC) Patient has been having cough with nonproductive sputum frequent throat clearing and dyspnea since first week of December.  Patient has been followed by Dr. Welton Flakes in pulmonary and has been prescribed Breztri long-acting beta agonist with inhaled steroid and inhaled anticholinergic.  It was also noted that she was supposed to be on DuoNeb but she said she did not have this so we gave her a DuoNeb treatment and she improved.  Subjectively and objectively as well.  She called Dr. Shanda Bumps office and they called in her DuoNeb and she will be following up with them.  I think her exacerbation was due to that she had not been taking her combination inhaled solution    Elizabeth Sauer, MD

## 2023-12-21 ENCOUNTER — Other Ambulatory Visit: Payer: Self-pay

## 2023-12-21 ENCOUNTER — Telehealth: Payer: Self-pay

## 2023-12-21 MED ORDER — FLUTICASONE FUROATE-VILANTEROL 100-25 MCG/ACT IN AEPB: 1.0000 | INHALATION_SPRAY | Freq: Every day | RESPIRATORY_TRACT | 11 refills | Status: DC

## 2023-12-21 NOTE — Telephone Encounter (Signed)
Patient called to see if she can stop Breztri and restart Breo, per DSK this is ok.

## 2023-12-29 ENCOUNTER — Other Ambulatory Visit: Payer: Self-pay

## 2023-12-29 DIAGNOSIS — K219 Gastro-esophageal reflux disease without esophagitis: Secondary | ICD-10-CM

## 2023-12-29 MED ORDER — PANTOPRAZOLE SODIUM 40 MG PO TBEC: 40.0000 mg | DELAYED_RELEASE_TABLET | Freq: Every day | ORAL | 2 refills | Status: DC

## 2024-01-01 ENCOUNTER — Other Ambulatory Visit: Payer: Self-pay | Admitting: Family Medicine

## 2024-01-01 DIAGNOSIS — F320 Major depressive disorder, single episode, mild: Secondary | ICD-10-CM

## 2024-01-01 DIAGNOSIS — F419 Anxiety disorder, unspecified: Secondary | ICD-10-CM

## 2024-01-10 ENCOUNTER — Other Ambulatory Visit: Payer: Self-pay | Admitting: Family Medicine

## 2024-01-10 DIAGNOSIS — E785 Hyperlipidemia, unspecified: Secondary | ICD-10-CM

## 2024-01-10 NOTE — Telephone Encounter (Signed)
 Copied from CRM [id number]. Topic: Clinical - Medication Refill >> Jan 10, 2024 10:17 AM Geroge Baseman wrote: Most Recent Primary Care Visit:  Provider: Duanne Limerick  Department: [facility]  Visit Type: OFFICE VISIT  Date: 12/10/2023  Medication: gemfibrozil (LOPID) 600 MG tablet  Has the patient contacted their pharmacy? Yes Pharmacy states they requested refill  Is this the correct pharmacy for this prescription? Yes If no, delete pharmacy and type the correct one.  This is the patient's preferred pharmacy:  [id number] - [place], [place] - [address] AT [facility] [address] [place] [address] [place] [address] Phone: [phone] Fax: [phone]   Has the prescription been filled recently? No  Is the patient out of the medication? Yes, will need it this evening  Has the patient been seen for an appointment in the last year OR does the patient have an upcoming appointment? No  Can we respond through MyChart? No  Agent: Please be advised that Rx refills may take up to 3 business days. We ask that you follow-up with your pharmacy.

## 2024-01-11 MED ORDER — GEMFIBROZIL 600 MG PO TABS: 600.0000 mg | ORAL_TABLET | Freq: Two times a day (BID) | ORAL | 0 refills | Status: DC

## 2024-01-11 NOTE — Telephone Encounter (Signed)
 Requested Prescriptions  Pending Prescriptions Disp Refills   gemfibrozil (LOPID) 600 MG tablet 180 tablet 0    Sig: Take 1 tablet (600 mg total) by mouth 2 (two) times daily.     Cardiovascular:  Antilipid - Fibric Acid Derivatives Failed - 01/11/2024 12:24 PM      Failed - Lipid Panel in normal range within the last 12 months    Cholesterol, Total  Date Value Ref Range Status  04/01/2023 143 100 - 199 mg/dL Final   LDL Chol Calc (NIH)  Date Value Ref Range Status  04/01/2023 81 0 - 99 mg/dL Final   HDL  Date Value Ref Range Status  04/01/2023 45 >39 mg/dL Final   Triglycerides  Date Value Ref Range Status  04/01/2023 90 0 - 149 mg/dL Final         Passed - ALT in normal range and within 360 days    ALT  Date Value Ref Range Status  04/01/2023 13 0 - 32 IU/L Final         Passed - AST in normal range and within 360 days    AST  Date Value Ref Range Status  04/01/2023 23 0 - 40 IU/L Final         Passed - Cr in normal range and within 360 days    Creatinine  Date Value Ref Range Status  12/31/2013 0.75 0.60 - 1.30 mg/dL Final   Creatinine, Ser  Date Value Ref Range Status  09/21/2023 0.76 0.57 - 1.00 mg/dL Final         Passed - HGB in normal range and within 360 days    Hemoglobin  Date Value Ref Range Status  09/21/2023 11.1 11.1 - 15.9 g/dL Final         Passed - HCT in normal range and within 360 days    Hematocrit  Date Value Ref Range Status  09/21/2023 34.1 34.0 - 46.6 % Final         Passed - PLT in normal range and within 360 days    Platelets  Date Value Ref Range Status  09/21/2023 334 150 - 450 x10E3/uL Final         Passed - WBC in normal range and within 360 days    WBC  Date Value Ref Range Status  09/21/2023 7.9 3.4 - 10.8 x10E3/uL Final  02/10/2023 8.0 4.0 - 10.5 K/uL Final         Passed - eGFR is 30 or above and within 360 days    EGFR (African American)  Date Value Ref Range Status  12/31/2013 >60  Final   GFR calc Af  Amer  Date Value Ref Range Status  12/20/2019 98 >59 mL/min/1.73 Final   EGFR (Non-African Amer.)  Date Value Ref Range Status  12/31/2013 >60  Final    Comment:    eGFR values <35mL/min/1.73 m2 may be an indication of chronic kidney disease (CKD). Calculated eGFR is useful in patients with stable renal function. The eGFR calculation will not be reliable in acutely ill patients when serum creatinine is changing rapidly. It is not useful in  patients on dialysis. The eGFR calculation may not be applicable to patients at the low and high extremes of body sizes, pregnant women, and vegetarians.    GFR, Estimated  Date Value Ref Range Status  07/21/2022 >60 >60 mL/min Final    Comment:    (NOTE) Calculated using the CKD-EPI Creatinine Equation (2021)    eGFR  Date Value Ref Range Status  09/21/2023 77 >59 mL/min/1.73 Final         Passed - Valid encounter within last 12 months    Recent Outpatient Visits           1 month ago Flu-like symptoms   [facility]   2 months ago COPD with acute bronchitis [facility])   [place] [facility] at [facility], Melton Alar, PA   3 months ago Abdominal pain, LLQ   [place] [facility]   3 months ago Abdominal pain, LLQ   [place] [facility]   6 months ago Elevated blood pressure, situational   [place] [facility]

## 2024-01-17 ENCOUNTER — Ambulatory Visit: Admitting: Family Medicine

## 2024-01-18 ENCOUNTER — Ambulatory Visit
Admission: RE | Admit: 2024-01-18 | Discharge: 2024-01-18 | Disposition: A | Discharge status: Home / Self Care | Attending: Nurse Practitioner | Admitting: Nurse Practitioner

## 2024-01-18 ENCOUNTER — Ambulatory Visit
Admission: RE | Admit: 2024-01-18 | Discharge: 2024-01-18 | Disposition: A | Discharge status: Home / Self Care | Source: Ambulatory Visit | Attending: Nurse Practitioner

## 2024-01-18 ENCOUNTER — Encounter: Payer: Self-pay | Admitting: Nurse Practitioner

## 2024-01-18 ENCOUNTER — Ambulatory Visit: Admitting: Nurse Practitioner

## 2024-01-18 VITALS — BP 150/70 | HR 84 | Temp 98.1°F | Resp 16 | Ht 62.0 in | Wt 189.6 lb

## 2024-01-18 DIAGNOSIS — R0989 Other specified symptoms and signs involving the circulatory and respiratory systems: Secondary | ICD-10-CM

## 2024-01-18 DIAGNOSIS — G4733 Obstructive sleep apnea (adult) (pediatric): Secondary | ICD-10-CM | POA: Diagnosis not present

## 2024-01-18 DIAGNOSIS — J4489 Other specified chronic obstructive pulmonary disease: Secondary | ICD-10-CM | POA: Diagnosis not present

## 2024-01-18 DIAGNOSIS — R0602 Shortness of breath: Secondary | ICD-10-CM

## 2024-01-18 DIAGNOSIS — J189 Pneumonia, unspecified organism: Secondary | ICD-10-CM

## 2024-01-18 DIAGNOSIS — R9389 Abnormal findings on diagnostic imaging of other specified body structures: Secondary | ICD-10-CM | POA: Diagnosis not present

## 2024-01-18 DIAGNOSIS — R059 Cough, unspecified: Secondary | ICD-10-CM | POA: Diagnosis not present

## 2024-01-18 MED ORDER — PREDNISONE 10 MG (21) PO TBPK: ORAL_TABLET | ORAL | 0 refills | Status: DC

## 2024-01-18 MED ORDER — HYDROCOD POLI-CHLORPHE POLI ER 10-8 MG/5ML PO SUER: 5.0000 mL | Freq: Two times a day (BID) | ORAL | 0 refills | Status: DC | PRN

## 2024-01-18 MED ORDER — LEVOFLOXACIN 750 MG PO TABS
750.0000 mg | ORAL_TABLET | Freq: Every day | ORAL | 0 refills | Status: DC
Start: 2024-01-18 — End: 2024-05-05

## 2024-01-18 NOTE — Progress Notes (Signed)
 [facility] [address] [place], [place] [address]  Internal MEDICINE  Office Visit Note  Patient Name: Regina Macdonald  [id number]  [id number]  Date of Service: 01/18/2024  Chief Complaint  Patient presents with   Acute Visit    Chronic cough    HPI Regina Macdonald presents for a follow-up visit for chronic cough, COPD and OSA on CPAP.  Chronic cough -- off and on since November. Has a nebulizer and refills of duoneb were sent in November to the pharmacy but they would not fill the script for her. Reordered again in January. Reporting cough, chest tightness, SOB and wheezing, activity intolerance and fatigue. Has been taking duoneb treatments 3 times daily and prn mucinex DM and her symptoms are not improving.  COPD -- using breo ellipta daily.  OSA on CPAP -- using every night    Current Medication: Outpatient Encounter Medications as of 01/18/2024  Medication Sig   acetaminophen (TYLENOL) 500 MG tablet Take 500 mg by mouth every 6 (six) hours as needed.   albuterol (VENTOLIN HFA) 108 (90 Base) MCG/ACT inhaler INHALE 2 PUFFS INTO THE LUNGS EVERY 6 HOURS AS NEEDED FOR WHEEZING OR SHORTNESS OF BREATH   aspirin 81 MG chewable tablet Chew 81 mg by mouth daily.   CALCIUM & MAGNESIUM CARBONATES PO Take 1 tablet by mouth daily.   chlorpheniramine-HYDROcodone (TUSSIONEX) 10-8 MG/5ML Take 5 mLs by mouth every 12 (twelve) hours as needed for cough.   EQUATE STOOL SOFTENER 100 MG capsule Take 1 capsule by mouth twice daily   FLUoxetine (PROZAC) 10 MG capsule TAKE 1 CAPSULE( 10 MG) BY MOUTH DAILY   FLUoxetine (PROZAC) 20 MG capsule Take 1 capsule (20 mg total) by mouth daily.   fluticasone (FLONASE) 50 MCG/ACT nasal spray SHAKE LIQUID AND USE 2 SPRAYS IN EACH NOSTRIL DAILY   fluticasone furoate-vilanterol (BREO ELLIPTA) 100-25 MCG/ACT AEPB Inhale 1 puff into the lungs daily.   gemfibrozil (LOPID) 600 MG tablet Take 1 tablet (600 mg total) by mouth 2 (two) times daily.   ibuprofen  (ADVIL) 200 MG tablet Take 400 mg by mouth every 6 (six) hours as needed for moderate pain.   ipratropium-albuterol (DUONEB) 0.5-2.5 (3) MG/3ML SOLN Take 3 mLs by nebulization every 6 (six) hours as needed.   levofloxacin (LEVAQUIN) 750 MG tablet Take 1 tablet (750 mg total) by mouth daily. Take with food   loratadine (EQ LORATADINE) 10 MG tablet Take 1 tablet (10 mg total) by mouth daily.   Multiple Vitamins-Minerals (CENTRUM SILVER PO) Take 1 capsule by mouth daily.   pantoprazole (PROTONIX) 40 MG tablet Take 1 tablet (40 mg total) by mouth daily.   polyethylene glycol (MIRALAX) packet Take 17 g by mouth daily.   predniSONE (STERAPRED UNI-PAK 21 TAB) 10 MG (21) TBPK tablet Use as directed for 6 days   No facility-administered encounter medications on file as of 01/18/2024.    Surgical History: Past Surgical History:  Procedure Laterality Date   ABDOMINAL HYSTERECTOMY     COLONOSCOPY  06/08/2013   Dr Janine Melbourne- small mouth diverticula   HIP SURGERY Left    KNEE SURGERY Bilateral    NASAL RECONSTRUCTION     x 2    Medical History: Past Medical History:  Diagnosis Date   Anxiety    Asthma    Cancer (HCC) cervical-1973   COPD (chronic obstructive pulmonary disease) (HCC)    Depression    Diverticula, colon    Diverticulosis    GERD (gastroesophageal reflux disease)  Sleep apnea     Family History: Family History  Problem Relation Age of Onset   Cirrhosis Father    Coronary artery disease Mother    Breast cancer Neg Hx     Social History   Socioeconomic History   Marital status: Divorced    Spouse name: Not on file   Number of children: 3   Years of education: 12   Highest education level: High school graduate  Occupational History   Occupation: retired  Tobacco Use   Smoking status: Former   Smokeless tobacco: Never  Building services engineer status: Never Used  Substance and Sexual Activity   Alcohol use: Not Currently   Drug use: Never   Sexual activity: Not  Currently    Partners: Male    Birth control/protection: Post-menopausal  Other Topics Concern   Not on file  Social History Narrative   Pt lives with her daughter   Social Drivers of Health   Financial Resource Strain: Low Risk  (10/30/2022)   Overall Financial Resource Strain (CARDIA)    Difficulty of Paying Living Expenses: Not hard at all  Food Insecurity: No Food Insecurity (10/30/2022)   Hunger Vital Sign    Worried About Running Out of Food in the Last Year: Never true    Ran Out of Food in the Last Year: Never true  Transportation Needs: No Transportation Needs (10/30/2022)   PRAPARE - Administrator, Civil Service (Medical): No    Lack of Transportation (Non-Medical): No  Physical Activity: Insufficiently Active (10/30/2022)   Exercise Vital Sign    Days of Exercise per Week: 7 days    Minutes of Exercise per Session: 10 min  Stress: No Stress Concern Present (10/30/2022)   [employer] of Occupational Health - Occupational Stress Questionnaire    Feeling of Stress : Only a little  Social Connections: Socially Isolated (10/30/2022)   Social Connection and Isolation Panel [NHANES]    Frequency of Communication with Friends and Family: More than three times a week    Frequency of Social Gatherings with Friends and Family: Three times a week    Attends Religious Services: Never    Active Member of Clubs or Organizations: No    Attends Banker Meetings: Never    Marital Status: Divorced  Catering manager Violence: Not At Risk (10/30/2022)   Humiliation, Afraid, Rape, and Kick questionnaire    Fear of Current or Ex-Partner: No    Emotionally Abused: No    Physically Abused: No    Sexually Abused: No      Review of Systems  Constitutional:  Positive for activity change and fatigue. Negative for chills and fever.  Respiratory:  Positive for cough, chest tightness, shortness of breath and wheezing.   Cardiovascular: Negative.  Negative  for chest pain and palpitations.  Musculoskeletal: Negative.   Psychiatric/Behavioral: Negative.      Vital Signs: BP (!) 150/70   Pulse 84   Temp 98.1 F (36.7 C)   Resp 16   Ht 5\' 2"  (1.575 m)   Wt 189 lb 9.6 oz (86 kg)   SpO2 96%   BMI 34.68 kg/m    Physical Exam Vitals reviewed.  Constitutional:      General: She is not in acute distress.    Appearance: Normal appearance. She is obese. She is ill-appearing.  HENT:     Head: Normocephalic and atraumatic.     Nose: Congestion and rhinorrhea present.  Mouth/Throat:     Mouth: Mucous membranes are moist.     Pharynx: Posterior oropharyngeal erythema present.  Eyes:     Pupils: Pupils are equal, round, and reactive to light.  Cardiovascular:     Rate and Rhythm: Normal rate and regular rhythm.     Heart sounds: Normal heart sounds. No murmur heard. Pulmonary:     Effort: Pulmonary effort is normal. No accessory muscle usage or respiratory distress.     Breath sounds: Normal air entry. Examination of the right-upper field reveals wheezing. Examination of the left-upper field reveals wheezing. Examination of the right-middle field reveals rales. Examination of the left-middle field reveals rales. Examination of the right-lower field reveals rales. Examination of the left-lower field reveals rales. Wheezing and rales present.  Skin:    Capillary Refill: Capillary refill takes less than 2 seconds.  Neurological:     Mental Status: She is alert and oriented to person, place, and time.  Psychiatric:        Mood and Affect: Mood normal.        Behavior: Behavior normal.        Assessment/Plan: 1. Walking pneumonia (Primary) Stat chest xray ordered. Take levlofloxacin and prednisone taper as prescribed, until gone. Cough medication prescribed for relief of cough.  - DG Chest 2 View; Future - levofloxacin (LEVAQUIN) 750 MG tablet; Take 1 tablet (750 mg total) by mouth daily. Take with food  Dispense: 7 tablet; Refill:  0 - predniSONE (STERAPRED UNI-PAK 21 TAB) 10 MG (21) TBPK tablet; Use as directed for 6 days  Dispense: 21 tablet; Refill: 0 - chlorpheniramine-HYDROcodone (TUSSIONEX) 10-8 MG/5ML; Take 5 mLs by mouth every 12 (twelve) hours as needed for cough.  Dispense: 140 mL; Refill: 0  2. Obstructive chronic bronchitis without exacerbation (HCC) Continue breo ellipta as prescribed. Take cough syrup as need for cough - chlorpheniramine-HYDROcodone (TUSSIONEX) 10-8 MG/5ML; Take 5 mLs by mouth every 12 (twelve) hours as needed for cough.  Dispense: 140 mL; Refill: 0  3. Bilateral rales Stat chest xray ordered. Take antibiotic and prednisone taper until  - DG Chest 2 View; Future - levofloxacin (LEVAQUIN) 750 MG tablet; Take 1 tablet (750 mg total) by mouth daily. Take with food  Dispense: 7 tablet; Refill: 0 - predniSONE (STERAPRED UNI-PAK 21 TAB) 10 MG (21) TBPK tablet; Use as directed for 6 days  Dispense: 21 tablet; Refill: 0  4. Shortness of breath Prednisone taper prescribed.   5. OSA on CPAP Continue CPAP use as  instructed    General Counseling: Regina Macdonald verbalizes understanding of the findings of todays visit and agrees with plan of treatment. I have discussed any further diagnostic evaluation that may be needed or ordered today. We also reviewed her medications today. she has been encouraged to call the office with any questions or concerns that should arise related to todays visit.    Orders Placed This Encounter  Procedures   DG Chest 2 View    Meds ordered this encounter  Medications   levofloxacin (LEVAQUIN) 750 MG tablet    Sig: Take 1 tablet (750 mg total) by mouth daily. Take with food    Dispense:  7 tablet    Refill:  0   predniSONE (STERAPRED UNI-PAK 21 TAB) 10 MG (21) TBPK tablet    Sig: Use as directed for 6 days    Dispense:  21 tablet    Refill:  0    Fill new script today.   chlorpheniramine-HYDROcodone (TUSSIONEX) 10-8 MG/5ML  Sig: Take 5 mLs by mouth every 12  (twelve) hours as needed for cough.    Dispense:  140 mL    Refill:  0    Fill new script today, please use goodrx if her insurance will not cover.    Return in about 1 month (around 02/18/2024) for F/U pulmonary follow for lower resp infection/COPD, with Arretta Toenjes or [facility]SK. .   Total time spent:30 Minutes Time spent includes review of chart, medications, test results, and follow up plan with the patient.   [place] Controlled Substance Database was reviewed by me.  This patient was seen by Laurence Pons, FNP-C in collaboration with Dr. Verneta Gone as a part of collaborative care agreement.   Nahomi Hegner R. Bobbi Burow, MSN, FNP-C Internal medicine

## 2024-01-19 NOTE — Telephone Encounter (Signed)
 Patient notified

## 2024-01-19 NOTE — Telephone Encounter (Signed)
-----   Message from [facility] sent at 01/19/2024  8:23 AM EST ----- Please let patient know that her chest xray was ok and did not show any pneumonia. But she needs to continue to take the antibiotic until it is gone

## 2024-01-19 NOTE — Progress Notes (Signed)
 Please let patient know that her chest xray was ok and did not show any pneumonia. But she needs to continue to take the antibiotic until it is gone

## 2024-02-08 ENCOUNTER — Ambulatory Visit: Admitting: Family Medicine

## 2024-02-23 ENCOUNTER — Telehealth: Payer: Self-pay | Admitting: Family Medicine

## 2024-02-23 NOTE — Telephone Encounter (Signed)
 Copied from CRM [id number]. Topic: General - Other >> Feb 23, 2024  3:27 PM Emylou G wrote: Reason for CRM: Moon w/[employer] w/medicare .Marland Kitchen Chronic condition unit.Marland Kitchen wanting to verify condition of patient.. pls call her [phone]

## 2024-02-24 ENCOUNTER — Other Ambulatory Visit: Payer: Self-pay

## 2024-02-24 ENCOUNTER — Telehealth: Payer: Self-pay | Admitting: Internal Medicine

## 2024-02-24 NOTE — Telephone Encounter (Signed)
 Patient called stating [employer] told her they have been faxing/emailing supply orders. I explained to her that I haven not received anything. I called Information systems manager. They will fax me order-Toni

## 2024-02-25 ENCOUNTER — Telehealth: Payer: Self-pay | Admitting: Internal Medicine

## 2024-02-25 NOTE — Telephone Encounter (Signed)
 Called they stated I needed a reference number they could not bring up the pt with there last name and DOB. Did not want to give too much of the patients information out.  KP

## 2024-02-25 NOTE — Telephone Encounter (Signed)
 Still have not received documents from [facility] for cpap supplies. Sent email to [employer] for [employer]

## 2024-02-26 ENCOUNTER — Encounter: Payer: Self-pay | Admitting: Nurse Practitioner

## 2024-02-26 DIAGNOSIS — J4489 Other specified chronic obstructive pulmonary disease: Secondary | ICD-10-CM | POA: Insufficient documentation

## 2024-02-26 DIAGNOSIS — J42 Unspecified chronic bronchitis: Secondary | ICD-10-CM | POA: Insufficient documentation

## 2024-02-29 ENCOUNTER — Ambulatory Visit: Admitting: Internal Medicine

## 2024-02-29 ENCOUNTER — Encounter: Payer: Self-pay | Admitting: Internal Medicine

## 2024-02-29 VITALS — BP 150/95 | HR 87 | Temp 98.3°F | Resp 16 | Ht 62.0 in | Wt 177.0 lb

## 2024-02-29 DIAGNOSIS — G4733 Obstructive sleep apnea (adult) (pediatric): Secondary | ICD-10-CM

## 2024-02-29 DIAGNOSIS — R053 Chronic cough: Secondary | ICD-10-CM | POA: Diagnosis not present

## 2024-02-29 DIAGNOSIS — R0602 Shortness of breath: Secondary | ICD-10-CM

## 2024-02-29 DIAGNOSIS — J4489 Other specified chronic obstructive pulmonary disease: Secondary | ICD-10-CM

## 2024-02-29 DIAGNOSIS — J986 Disorders of diaphragm: Secondary | ICD-10-CM

## 2024-02-29 MED ORDER — BREZTRI AEROSPHERE 160-9-4.8 MCG/ACT IN AERO: 2.0000 | INHALATION_SPRAY | Freq: Two times a day (BID) | RESPIRATORY_TRACT | 11 refills | Status: DC

## 2024-02-29 NOTE — Patient Instructions (Signed)

## 2024-02-29 NOTE — Progress Notes (Signed)
 [facility] [address] [place], [place] [address]  Pulmonary Sleep Medicine   Office Visit Note  Patient Name: Regina Macdonald DOB: [birth year] MRN [id number]  Date of Service: 02/29/2024  Complaints/HPI: She is doing well. States that she had not been using her until fbruary. She states she has a cough starts as a something in her throat. She notes that her cough is worse during pollen season. She has been on breo and this has not made a big difference in her cough. She does have a history of GERD. She has been on tussionex and steroids. She is also on protonix  for GERD. She has PAP therapy and her machine is very old. It may need to be replaced due to age and not as effective.  Office Spirometry Results: Peak Flow: (!) 4 L/min FEV1: 1.26 liters FVC: 1.51 liters FEV1/FVC: 83.4 % FVC  % Predicted: 69 % FEV % Predicted: 78 % FeF 25-75: 1.44 liters FeF 25-75 % Predicted: 133   ROS  General: (-) fever, (-) chills, (-) night sweats, (-) weakness Skin: (-) rashes, (-) itching,. Eyes: (-) visual changes, (-) redness, (-) itching. Nose and Sinuses: (-) nasal stuffiness or itchiness, (-) postnasal drip, (-) nosebleeds, (-) sinus trouble. Mouth and Throat: (-) sore throat, (-) hoarseness. Neck: (-) swollen glands, (-) enlarged thyroid, (-) neck pain. Respiratory: + cough, (-) bloody sputum, + shortness of breath, - wheezing. Cardiovascular: - ankle swelling, (-) chest pain. Lymphatic: (-) lymph node enlargement. Neurologic: (-) numbness, (-) tingling. Psychiatric: (-) anxiety, (-) depression   Current Medication: Outpatient Encounter Medications as of 02/29/2024  Medication Sig   acetaminophen  (TYLENOL ) 500 MG tablet Take 500 mg by mouth every 6 (six) hours as needed.   albuterol  (VENTOLIN  HFA) 108 (90 Base) MCG/ACT inhaler INHALE 2 PUFFS INTO THE LUNGS EVERY 6 HOURS AS NEEDED FOR WHEEZING OR SHORTNESS OF BREATH   aspirin  81 MG chewable tablet Chew 81 mg by mouth  daily.   CALCIUM  & MAGNESIUM CARBONATES PO Take 1 tablet by mouth daily.   chlorpheniramine-HYDROcodone (TUSSIONEX) 10-8 MG/5ML Take 5 mLs by mouth every 12 (twelve) hours as needed for cough.   EQUATE STOOL SOFTENER 100 MG capsule Take 1 capsule by mouth twice daily   FLUoxetine  (PROZAC ) 10 MG capsule TAKE 1 CAPSULE( 10 MG) BY MOUTH DAILY   FLUoxetine  (PROZAC ) 20 MG capsule Take 1 capsule (20 mg total) by mouth daily.   fluticasone  (FLONASE ) 50 MCG/ACT nasal spray SHAKE LIQUID AND USE 2 SPRAYS IN EACH NOSTRIL DAILY   fluticasone  furoate-vilanterol (BREO ELLIPTA ) 100-25 MCG/ACT AEPB Inhale 1 puff into the lungs daily.   gemfibrozil  (LOPID ) 600 MG tablet Take 1 tablet (600 mg total) by mouth 2 (two) times daily.   ibuprofen (ADVIL) 200 MG tablet Take 400 mg by mouth every 6 (six) hours as needed for moderate pain.   ipratropium-albuterol  (DUONEB) 0.5-2.5 (3) MG/3ML SOLN Take 3 mLs by nebulization every 6 (six) hours as needed.   levofloxacin  (LEVAQUIN ) 750 MG tablet Take 1 tablet (750 mg total) by mouth daily. Take with food   loratadine  (EQ LORATADINE ) 10 MG tablet Take 1 tablet (10 mg total) by mouth daily.   Multiple Vitamins-Minerals (CENTRUM SILVER PO) Take 1 capsule by mouth daily.   pantoprazole  (PROTONIX ) 40 MG tablet Take 1 tablet (40 mg total) by mouth daily.   polyethylene glycol (MIRALAX ) packet Take 17 g by mouth daily.   predniSONE  (STERAPRED UNI-PAK 21 TAB) 10 MG (21) TBPK tablet Use as  directed for 6 days   No facility-administered encounter medications on file as of 02/29/2024.    Surgical History: Past Surgical History:  Procedure Laterality Date   ABDOMINAL HYSTERECTOMY     COLONOSCOPY  06/08/2013   Dr Janine Melbourne- small mouth diverticula   HIP SURGERY Left    KNEE SURGERY Bilateral    NASAL RECONSTRUCTION     x 2    Medical History: Past Medical History:  Diagnosis Date   Anxiety    Asthma    Cancer (HCC) cervical-1973   COPD (chronic obstructive pulmonary disease)  (HCC)    Depression    Diverticula, colon    Diverticulosis    GERD (gastroesophageal reflux disease)    Sleep apnea     Family History: Family History  Problem Relation Age of Onset   Cirrhosis Father    Coronary artery disease Mother    Breast cancer Neg Hx     Social History: Social History   Socioeconomic History   Marital status: Divorced    Spouse name: Not on file   Number of children: 3   Years of education: 12   Highest education level: High school graduate  Occupational History   Occupation: retired  Tobacco Use   Smoking status: Former   Smokeless tobacco: Never  Advertising account planner   Vaping status: Never Used  Substance and Sexual Activity   Alcohol use: Not Currently   Drug use: Never   Sexual activity: Not Currently    Partners: Male    Birth control/protection: Post-menopausal  Other Topics Concern   Not on file  Social History Narrative   Pt lives with her daughter   Social Drivers of Health   Financial Resource Strain: Low Risk  (10/30/2022)   Overall Financial Resource Strain (CARDIA)    Difficulty of Paying Living Expenses: Not hard at all  Food Insecurity: No Food Insecurity (10/30/2022)   Hunger Vital Sign    Worried About Running Out of Food in the Last Year: Never true    Ran Out of Food in the Last Year: Never true  Transportation Needs: No Transportation Needs (10/30/2022)   PRAPARE - Administrator, Civil Service (Medical): No    Lack of Transportation (Non-Medical): No  Physical Activity: Insufficiently Active (10/30/2022)   Exercise Vital Sign    Days of Exercise per Week: 7 days    Minutes of Exercise per Session: 10 min  Stress: No Stress Concern Present (10/30/2022)   [employer] of Occupational Health - Occupational Stress Questionnaire    Feeling of Stress : Only a little  Social Connections: Socially Isolated (10/30/2022)   Social Connection and Isolation Panel [NHANES]    Frequency of Communication with  Friends and Family: More than three times a week    Frequency of Social Gatherings with Friends and Family: Three times a week    Attends Religious Services: Never    Active Member of Clubs or Organizations: No    Attends Banker Meetings: Never    Marital Status: Divorced  Catering manager Violence: Not At Risk (10/30/2022)   Humiliation, Afraid, Rape, and Kick questionnaire    Fear of Current or Ex-Partner: No    Emotionally Abused: No    Physically Abused: No    Sexually Abused: No    Vital Signs: Blood pressure (!) 150/95, pulse 87, temperature 98.3 F (36.8 C), resp. rate 16, height 5\' 2"  (1.575 m), weight 177 lb (80.3 kg), SpO2 97%, peak flow Aaron Aas)  4 L/min.  Examination: General Appearance: The patient is well-developed, well-nourished, and in no distress. Skin: Gross inspection of skin unremarkable. Head: normocephalic, no gross deformities. Eyes: no gross deformities noted. ENT: ears appear grossly normal no exudates. Neck: Supple. No thyromegaly. No LAD. Respiratory: no rhonchi. Cardiovascular: Normal S1 and S2 without murmur or rub. Extremities: No cyanosis. pulses are equal. Neurologic: Alert and oriented. No involuntary movements.  LABS: Recent Results (from the past 2160 hours)  POC COVID-19 BinaxNow     Status: Normal   Collection Time: 12/10/23  8:55 AM  Result Value Ref Range   SARS Coronavirus 2 Ag Negative Negative  POCT Influenza A/B     Status: Normal   Collection Time: 12/10/23  8:55 AM  Result Value Ref Range   Influenza A, POC Negative Negative   Influenza B, POC Negative Negative    Radiology: DG Chest 2 View Result Date: 01/18/2024 CLINICAL DATA:  Cough. EXAM: CHEST - 2 VIEW COMPARISON:  February 10, 2023. FINDINGS: The heart size and mediastinal contours are within normal limits. Both lungs are clear. Stable elevated right hemidiaphragm. The visualized skeletal structures are unremarkable. IMPRESSION: No active cardiopulmonary disease.  Electronically Signed   By: Rosalene Colon M.D.   On: 01/18/2024 17:05    No results found.  No results found.  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 Shortness of breath (Primary)  Patient appears to be doing on load better we did give samples of inhalers to use for the shortness breath probably related to underlying COPD as well as diaphragmatic dysfunction. - Spirometry with graph  2. Obstructive chronic bronchitis without exacerbation (HCC) Samples of breztri  given. Lungs are clear PFT looks good - budeson-glycopyrrolate-formoterol (BREZTRI  AEROSPHERE) 160-9-4.8 MCG/ACT AERO inhaler; Inhale 2 puffs into the lungs 2 (two) times daily.  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  The patient's CPAP device is beyond its natural life span and needs to be replaced - For home use only DME continuous positive airway pressure  (CPAP)  4. OSA on CPAP  Will try to get the new CPAP device but patient does needed updated C Paps and PSG study done - For home use only DME continuous positive airway pressure (CPAP) - PSG Sleep Study; Future  5. Chronic cough  Appears to be under better control may be GERD related will continue to monitor this   General Counseling: I have discussed the findings of the evaluation and examination with [place].  I have also  discussed any further diagnostic evaluation thatmay be needed or ordered today. Adelee verbalizes understanding of the findings of todays visit. We also reviewed her medications today and discussed drug interactions and side effects including but not limited excessive drowsiness and altered mental states. We also discussed that there is always a risk not just to her but also people around her. she has been encouraged to call the office with any questions or concerns that should arise related to todays visit.  Orders Placed This Encounter  Procedures   Spirometry with graph    Where should this test be performed?:   [facility]     Time spent: 61  I have personally obtained a history, examined the patient, evaluated laboratory and imaging results, formulated the assessment and plan and placed orders.    Cordie Deters, MD [facility] Pulmonary and Critical Care Sleep medicine

## 2024-03-02 ENCOUNTER — Other Ambulatory Visit: Payer: Self-pay | Admitting: Family Medicine

## 2024-03-02 ENCOUNTER — Telehealth: Payer: Self-pay | Admitting: Internal Medicine

## 2024-03-02 DIAGNOSIS — F320 Major depressive disorder, single episode, mild: Secondary | ICD-10-CM

## 2024-03-02 DIAGNOSIS — F419 Anxiety disorder, unspecified: Secondary | ICD-10-CM

## 2024-03-02 NOTE — Telephone Encounter (Signed)
 Have not received any response from [facility] regarding cpap supply order. Order from [facility] emailed to me had incorrect provider. Emailed [facility] twice, no response. Finally just emailed signed order to [email or web address]-Toni

## 2024-03-06 ENCOUNTER — Other Ambulatory Visit: Payer: Self-pay | Admitting: Family Medicine

## 2024-03-06 ENCOUNTER — Other Ambulatory Visit: Payer: Self-pay | Admitting: Internal Medicine

## 2024-03-06 ENCOUNTER — Telehealth: Payer: Self-pay

## 2024-03-06 DIAGNOSIS — J309 Allergic rhinitis, unspecified: Secondary | ICD-10-CM

## 2024-03-06 DIAGNOSIS — J4489 Other specified chronic obstructive pulmonary disease: Secondary | ICD-10-CM

## 2024-03-06 MED ORDER — VOQUEZNA 10 MG PO TABS: 10.0000 mg | ORAL_TABLET | Freq: Every day | ORAL | 1 refills | Status: DC

## 2024-03-06 NOTE — Telephone Encounter (Signed)
 Sent 10mg  Voquezna , ok per DSK. Asked patient to let me know if it is too expensive, if it is I can send it to a specialty pharmacy.

## 2024-03-07 ENCOUNTER — Telehealth: Payer: Self-pay | Admitting: Internal Medicine

## 2024-03-07 ENCOUNTER — Other Ambulatory Visit: Payer: Self-pay

## 2024-03-07 DIAGNOSIS — G4733 Obstructive sleep apnea (adult) (pediatric): Secondary | ICD-10-CM | POA: Diagnosis not present

## 2024-03-07 NOTE — Telephone Encounter (Signed)
 Received corrected supply order from [facility]. Gave to DSK for signature-Toni

## 2024-03-07 NOTE — Telephone Encounter (Signed)
 Requested Prescriptions  Pending Prescriptions Disp Refills   fluticasone  (FLONASE ) 50 MCG/ACT nasal spray [Pharmacy Med Name: FLUTICASONE  NASAL SP (120) RX] 48 g 0    Sig: SHAKE LIQUID AND USE 2 SPRAYS IN EACH NOSTRIL DAILY     Ear, Nose, and Throat: Nasal Preparations - Corticosteroids Failed - 03/07/2024 11:55 AM      Failed - Valid encounter within last 12 months    Recent Outpatient Visits   None

## 2024-03-08 ENCOUNTER — Other Ambulatory Visit: Payer: Self-pay

## 2024-03-08 MED ORDER — VOQUEZNA 10 MG PO TABS: 10.0000 mg | ORAL_TABLET | Freq: Every day | ORAL | 1 refills | Status: DC

## 2024-03-15 ENCOUNTER — Telehealth: Payer: Self-pay | Admitting: Internal Medicine

## 2024-03-15 NOTE — Telephone Encounter (Signed)
 Cpap supply order signed. Faxed back to [facility]; [phone]. Scanned-tw

## 2024-03-28 ENCOUNTER — Other Ambulatory Visit: Payer: Self-pay | Admitting: Family Medicine

## 2024-03-28 DIAGNOSIS — K219 Gastro-esophageal reflux disease without esophagitis: Secondary | ICD-10-CM

## 2024-04-03 ENCOUNTER — Other Ambulatory Visit: Payer: Self-pay | Admitting: Family Medicine

## 2024-04-03 DIAGNOSIS — E785 Hyperlipidemia, unspecified: Secondary | ICD-10-CM

## 2024-04-05 NOTE — Telephone Encounter (Signed)
 Requested Prescriptions  Pending Prescriptions Disp Refills   gemfibrozil  (LOPID ) 600 MG tablet [Pharmacy Med Name: GEMFIBROZIL  600MG  TABLETS] 180 tablet 0    Sig: TAKE 1 TABLET(600 MG) BY MOUTH TWICE DAILY     Cardiovascular:  Antilipid - Fibric Acid Derivatives Failed - 04/05/2024 12:45 PM      Failed - ALT in normal range and within 360 days    ALT  Date Value Ref Range Status  04/01/2023 13 0 - 32 IU/L Final         Failed - AST in normal range and within 360 days    AST  Date Value Ref Range Status  04/01/2023 23 0 - 40 IU/L Final         Failed - Valid encounter within last 12 months    Recent Outpatient Visits   None            Failed - Lipid Panel in normal range within the last 12 months    Cholesterol, Total  Date Value Ref Range Status  04/01/2023 143 100 - 199 mg/dL Final   LDL Chol Calc (NIH)  Date Value Ref Range Status  04/01/2023 81 0 - 99 mg/dL Final   HDL  Date Value Ref Range Status  04/01/2023 45 >39 mg/dL Final   Triglycerides  Date Value Ref Range Status  04/01/2023 90 0 - 149 mg/dL Final         Passed - Cr in normal range and within 360 days    Creatinine  Date Value Ref Range Status  12/31/2013 0.75 0.60 - 1.30 mg/dL Final   Creatinine, Ser  Date Value Ref Range Status  09/21/2023 0.76 0.57 - 1.00 mg/dL Final         Passed - HGB in normal range and within 360 days    Hemoglobin  Date Value Ref Range Status  09/21/2023 11.1 11.1 - 15.9 g/dL Final         Passed - HCT in normal range and within 360 days    Hematocrit  Date Value Ref Range Status  09/21/2023 34.1 34.0 - 46.6 % Final         Passed - PLT in normal range and within 360 days    Platelets  Date Value Ref Range Status  09/21/2023 334 150 - 450 x10E3/uL Final         Passed - WBC in normal range and within 360 days    WBC  Date Value Ref Range Status  09/21/2023 7.9 3.4 - 10.8 x10E3/uL Final  02/10/2023 8.0 4.0 - 10.5 K/uL Final         Passed - eGFR is  30 or above and within 360 days    EGFR (African American)  Date Value Ref Range Status  12/31/2013 >60  Final   GFR calc Af Amer  Date Value Ref Range Status  12/20/2019 98 >59 mL/min/1.73 Final   EGFR (Non-African Amer.)  Date Value Ref Range Status  12/31/2013 >60  Final    Comment:    eGFR values <40mL/min/1.73 m2 may be an indication of chronic kidney disease (CKD). Calculated eGFR is useful in patients with stable renal function. The eGFR calculation will not be reliable in acutely ill patients when serum creatinine is changing rapidly. It is not useful in  patients on dialysis. The eGFR calculation may not be applicable to patients at the low and high extremes of body sizes, pregnant women, and vegetarians.    GFR, Estimated  Date Value Ref Range Status  07/21/2022 >60 >60 mL/min Final    Comment:    (NOTE) Calculated using the CKD-EPI Creatinine Equation (2021)    eGFR  Date Value Ref Range Status  09/21/2023 77 >59 mL/min/1.73 Final

## 2024-04-06 ENCOUNTER — Telehealth: Payer: Self-pay | Admitting: Internal Medicine

## 2024-04-06 NOTE — Telephone Encounter (Signed)
 SS order emailed to [facility] w/  [facility]-Toni

## 2024-04-06 NOTE — Telephone Encounter (Signed)
 Per request, 02/29/24 Office notes faxed to [facility]; [phone]

## 2024-04-11 ENCOUNTER — Telehealth: Payer: Self-pay | Admitting: Internal Medicine

## 2024-04-11 NOTE — Telephone Encounter (Signed)
 Patient called regarding cpap. I explained to her, she needs to have SS done first before ordering cpap. I told her SS order was emailed to [facility] last week and they will call her for appointment after checking with her insurance-Toni

## 2024-04-21 ENCOUNTER — Telehealth: Payer: Self-pay | Admitting: Internal Medicine

## 2024-04-21 NOTE — Telephone Encounter (Signed)
 SS appointment 05/18/24 @ Regina Macdonald

## 2024-04-26 ENCOUNTER — Ambulatory Visit

## 2024-04-26 VITALS — Ht 63.0 in | Wt 175.0 lb

## 2024-04-26 DIAGNOSIS — Z780 Asymptomatic menopausal state: Secondary | ICD-10-CM

## 2024-04-26 DIAGNOSIS — Z1231 Encounter for screening mammogram for malignant neoplasm of breast: Secondary | ICD-10-CM

## 2024-04-26 DIAGNOSIS — Z0000 Encounter for general adult medical examination without abnormal findings: Secondary | ICD-10-CM

## 2024-04-26 NOTE — Progress Notes (Signed)
 Subjective:   Regina Macdonald is a 83 y.o. who presents for a Medicare Wellness preventive visit.  As a reminder, Annual Wellness Visits don't include a physical exam, and some assessments may be limited, especially if this visit is performed virtually. We may recommend an in-person follow-up visit with your provider if needed.  Visit Complete: Virtual I connected with  Regina Macdonald on 04/26/24 by a audio enabled telemedicine application and verified that I am speaking with the correct person using two identifiers.  Patient Location: Home  Provider Location: Home Office  I discussed the limitations of evaluation and management by telemedicine. The patient expressed understanding and agreed to proceed.  Vital Signs: Because this visit was a virtual/telehealth visit, some criteria may be missing or patient reported. Any vitals not documented were not able to be obtained and vitals that have been documented are patient reported.  VideoDeclined- This patient declined Librarian, academic. Therefore the visit was completed with audio only.  Persons Participating in Visit: Patient.  AWV Questionnaire: No: Patient Medicare AWV questionnaire was not completed prior to this visit.  Cardiac Risk Factors include: advanced age (>28men, >58 women);dyslipidemia;obesity (BMI >30kg/m2);Other (see comment), Risk factor comments: OSA (cpap)     Objective:     Today's Vitals   04/26/24 1405  Weight: 175 lb (79.4 kg)  Height: 5' 3 (1.6 m)   Body mass index is 31 kg/m.     04/26/2024    2:24 PM 10/30/2022   11:20 AM 07/21/2022   12:04 PM 10/22/2021   11:16 AM 07/29/2021    5:01 PM 10/16/2020   11:43 AM 10/16/2019   11:29 AM  Advanced Directives  Does Patient Have a Medical Advance Directive? Yes No No Yes No No No  Type of Estate agent of [place];Living will   Healthcare Power of [place];Living will     Does patient want to make changes  to medical advance directive? No - Patient declined        Copy of Healthcare Power of Attorney in Chart? Yes - validated most recent copy scanned in chart (See row information)   No - copy requested     Would patient like information on creating a medical advance directive?  No - Patient declined   No - Patient declined No - Patient declined No - Patient declined    Current Medications (verified) Outpatient Encounter Medications as of 04/26/2024  Medication Sig   acetaminophen  (TYLENOL ) 500 MG tablet Take 500 mg by mouth every 6 (six) hours as needed.   aspirin  81 MG chewable tablet Chew 81 mg by mouth daily.   budeson-glycopyrrolate-formoterol (BREZTRI  AEROSPHERE) 160-9-4.8 MCG/ACT AERO inhaler Inhale 2 puffs into the lungs 2 (two) times daily.   CALCIUM  & MAGNESIUM CARBONATES PO Take 1 tablet by mouth daily.   diphenhydramine-acetaminophen  (TYLENOL  PM) 25-500 MG TABS tablet Take 1 tablet by mouth at bedtime.   EQUATE STOOL SOFTENER 100 MG capsule Take 1 capsule by mouth twice daily   FLUoxetine  (PROZAC ) 20 MG capsule TAKE 1 CAPSULE(20 MG) BY MOUTH DAILY   fluticasone  (FLONASE ) 50 MCG/ACT nasal spray SHAKE LIQUID AND USE 2 SPRAYS IN EACH NOSTRIL DAILY   gemfibrozil  (LOPID ) 600 MG tablet TAKE 1 TABLET(600 MG) BY MOUTH TWICE DAILY   ibuprofen (ADVIL) 200 MG tablet Take 400 mg by mouth every 6 (six) hours as needed for moderate pain.   ipratropium-albuterol  (DUONEB) 0.5-2.5 (3) MG/3ML SOLN USE 3 ML VIA NEBULIZER EVERY 6 HOURS  AS NEEDED   loratadine  (EQ LORATADINE ) 10 MG tablet Take 1 tablet (10 mg total) by mouth daily.   Multiple Vitamins-Minerals (CENTRUM SILVER PO) Take 1 capsule by mouth daily.   pantoprazole  (PROTONIX ) 40 MG tablet TAKE 1 TABLET(40 MG) BY MOUTH DAILY   polyethylene glycol (MIRALAX ) packet Take 17 g by mouth daily.   albuterol  (VENTOLIN  HFA) 108 (90 Base) MCG/ACT inhaler INHALE 2 PUFFS INTO THE LUNGS EVERY 6 HOURS AS NEEDED FOR WHEEZING OR SHORTNESS OF BREATH (Patient not  taking: Reported on 04/26/2024)   chlorpheniramine-HYDROcodone (TUSSIONEX) 10-8 MG/5ML Take 5 mLs by mouth every 12 (twelve) hours as needed for cough. (Patient not taking: Reported on 04/26/2024)   FLUoxetine  (PROZAC ) 10 MG capsule TAKE 1 CAPSULE( 10 MG) BY MOUTH DAILY (Patient not taking: Reported on 04/26/2024)   levofloxacin  (LEVAQUIN ) 750 MG tablet Take 1 tablet (750 mg total) by mouth daily. Take with food (Patient not taking: Reported on 04/26/2024)   predniSONE  (STERAPRED UNI-PAK 21 TAB) 10 MG (21) TBPK tablet Use as directed for 6 days (Patient not taking: Reported on 04/26/2024)   Vonoprazan Fumarate  (VOQUEZNA ) 10 MG TABS Take 10 mg by mouth daily. (Patient not taking: Reported on 04/26/2024)   No facility-administered encounter medications on file as of 04/26/2024.    Allergies (verified) Ciprofloxacin, Etodolac, Tramadol , and Sulfa antibiotics   History: Past Medical History:  Diagnosis Date   Anxiety    Asthma    Cancer (HCC) cervical-1973   COPD (chronic obstructive pulmonary disease) (HCC)    Depression    Diverticula, colon    Diverticulosis    GERD (gastroesophageal reflux disease)    Sleep apnea    Past Surgical History:  Procedure Laterality Date   ABDOMINAL HYSTERECTOMY     COLONOSCOPY  06/08/2013   Dr Janine Melbourne- small mouth diverticula   HIP SURGERY Left    KNEE SURGERY Bilateral    NASAL RECONSTRUCTION     x 2   Family History  Problem Relation Age of Onset   Cirrhosis Father    Coronary artery disease Mother    Breast cancer Neg Hx    Social History   Socioeconomic History   Marital status: Divorced    Spouse name: Not on file   Number of children: 3   Years of education: 12   Highest education level: High school graduate  Occupational History   Occupation: retired  Tobacco Use   Smoking status: Former    Current packs/day: 0.00    Types: Cigarettes    Quit date: 1971    Years since quitting: 54.4    Passive exposure: Never   Smokeless tobacco:  Never   Tobacco comments:    04/26/24 Never inhaled when she was smoking. Could not tell me how long she smoked  Vaping Use   Vaping status: Never Used  Substance and Sexual Activity   Alcohol use: Not Currently   Drug use: Never   Sexual activity: Not Currently    Partners: Male    Birth control/protection: Post-menopausal  Other Topics Concern   Not on file  Social History Narrative   Pt lives with her daughter   2 children living and 1 deceased   Social Drivers of Health   Financial Resource Strain: Low Risk  (04/26/2024)   Overall Financial Resource Strain (CARDIA)    Difficulty of Paying Living Expenses: Not hard at all  Food Insecurity: No Food Insecurity (04/26/2024)   Hunger Vital Sign    Worried About Running Out  of Food in the Last Year: Never true    Ran Out of Food in the Last Year: Never true  Transportation Needs: No Transportation Needs (04/26/2024)   PRAPARE - Administrator, Civil Service (Medical): No    Lack of Transportation (Non-Medical): No  Physical Activity: Inactive (04/26/2024)   Exercise Vital Sign    Days of Exercise per Week: 0 days    Minutes of Exercise per Session: 0 min  Stress: No Stress Concern Present (04/26/2024)   [employer] of Occupational Health - Occupational Stress Questionnaire    Feeling of Stress : Not at all  Social Connections: Socially Isolated (04/26/2024)   Social Connection and Isolation Panel [NHANES]    Frequency of Communication with Friends and Family: More than three times a week    Frequency of Social Gatherings with Friends and Family: Twice a week    Attends Religious Services: Never    Database administrator or Organizations: No    Attends Banker Meetings: Never    Marital Status: Divorced    Tobacco Counseling Counseling given: Not Answered Tobacco comments: 04/26/24 Never inhaled when she was smoking. Could not tell me how long she smoked    Clinical Intake:  Pre-visit  preparation completed: Yes  Pain : No/denies pain     BMI - recorded: 31 Nutritional Status: BMI > 30  Obese Nutritional Risks: None Diabetes: No  Lab Results  Component Value Date   HGBA1C 5.7 (H) 12/02/2015     How often do you need to have someone help you when you read instructions, pamphlets, or other written materials from your doctor or pharmacy?: 1 - Never  Interpreter Needed?: No  Information entered by :: Jaunita Messier, CMA   Activities of Daily Living     04/26/2024    2:06 PM  In your present state of health, do you have any difficulty performing the following activities:  Hearing? 0  Vision? 0  Difficulty concentrating or making decisions? 0  Walking or climbing stairs? 0  Dressing or bathing? 0  Doing errands, shopping? 0  Preparing Food and eating ? N  Using the Toilet? N  In the past six months, have you accidently leaked urine? N  Do you have problems with loss of bowel control? N  Managing your Medications? N  Managing your Finances? N  Housekeeping or managing your Housekeeping? N    Patient Care Team: Kotturi, Vinay K, MD as PCP - General (Family Medicine) Cordie Deters, MD as Consulting Physician (Pulmonary Disease) Ritter, Kevin J, OD (Optometry)  I have updated your Care Teams any recent Medical Services you may have received from other providers in the past year.     Assessment:    This is a routine wellness examination for Regina Macdonald.  Hearing/Vision screen Hearing Screening - Comments:: Denies hearing loss Vision Screening - Comments:: Gets routine eye exams,    Goals Addressed             This Visit's Progress    Patient Stated       Enjoy life       Depression Screen     04/26/2024    2:21 PM 12/10/2023    8:42 AM 10/20/2023    3:34 PM 09/14/2023    1:44 PM 07/15/2023    3:05 PM 04/01/2023   11:01 AM 02/12/2023    3:15 PM  PHQ 2/9 Scores  PHQ - 2 Score 0 0 0 1 0  0 0  PHQ- 9 Score 0 3 0 4 0 0 0    Fall Risk      04/26/2024    2:26 PM 12/10/2023    8:42 AM 10/20/2023    3:34 PM 09/14/2023    1:44 PM 07/15/2023    3:04 PM  Fall Risk   Falls in the past year? 1 0 0 1 1  Number falls in past yr: 1 0 0 1 0  Injury with Fall? 0 0 0 1 1  Risk for fall due to : History of fall(s);Impaired balance/gait;Orthopedic patient No Fall Risks No Fall Risks History of fall(s) No Fall Risks  Follow up Falls evaluation completed;Education provided Falls evaluation completed Falls evaluation completed Falls evaluation completed Falls evaluation completed;Falls prevention discussed    MEDICARE RISK AT HOME:  Medicare Risk at Home Any stairs in or around the home?: No If so, are there any without handrails?: No Home free of loose throw rugs in walkways, pet beds, electrical cords, etc?: Yes Adequate lighting in your home to reduce risk of falls?: Yes Life alert?: No Use of a cane, walker or w/c?: No Grab bars in the bathroom?: No Shower chair or bench in shower?: No Elevated toilet seat or a handicapped toilet?: No  TIMED UP AND GO:  Was the test performed?  No  Cognitive Function: 6CIT completed        04/26/2024    2:27 PM 10/30/2022   11:08 AM 10/16/2020   11:46 AM 10/16/2019   11:32 AM 09/28/2018    9:19 AM  6CIT Screen  What Year? 0 points 0 points 0 points 0 points 0 points  What month? 0 points 0 points 0 points 0 points 0 points  What time? 0 points 0 points 0 points 0 points 0 points  Count back from 20 0 points 0 points 0 points 0 points 0 points  Months in reverse 2 points 0 points 0 points 0 points 0 points  Repeat phrase 0 points 8 points 0 points 0 points 2 points  Total Score 2 points 8 points 0 points 0 points 2 points    Immunizations Immunization History  Administered Date(s) Administered   Fluad Quad(high Dose 65+) 07/31/2019, 09/05/2021, 07/31/2022, 08/06/2023   Influenza Inj Mdck Quad Pf 08/19/2020   Influenza, High Dose Seasonal PF 08/16/2017, 08/07/2018   Influenza,inj,Quad  PF,6+ Mos 09/09/2015   Influenza-Unspecified 07/24/2014, 08/07/2018, 08/26/2020   PFIZER(Purple Top)SARS-COV-2 Vaccination 01/17/2020, 02/07/2020, 10/11/2020   PNEUMOCOCCAL CONJUGATE-20 04/02/2022   Pneumococcal Conjugate-13 09/09/2015   Pneumococcal Polysaccharide-23 02/09/2013   Respiratory Syncytial Virus Vaccine,Recomb Aduvanted(Arexvy) 08/06/2023   Tdap 08/10/2011, 07/29/2021, 04/02/2022   Unspecified SARS-COV-2 Vaccination 08/06/2023   Zoster Recombinant(Shingrix) 04/02/2022   Zoster, Live 02/27/2015    Screening Tests Health Maintenance  Topic Date Due   DEXA SCAN  03/18/2017   Zoster Vaccines- Shingrix (2 of 2) 05/28/2022   COVID-19 Vaccine (5 - 2024-25 season) 02/03/2024   INFLUENZA VACCINE  06/16/2024   MAMMOGRAM  06/21/2024   Medicare Annual Wellness (AWV)  04/26/2025   DTaP/Tdap/Td (4 - Td or Tdap) 04/02/2032   Pneumonia Vaccine 14+ Years old  Completed   HPV VACCINES  Aged Out   Meningococcal B Vaccine  Aged Out    Health Maintenance  Health Maintenance Due  Topic Date Due   DEXA SCAN  03/18/2017   Zoster Vaccines- Shingrix (2 of 2) 05/28/2022   COVID-19 Vaccine (5 - 2024-25 season) 02/03/2024   Health Maintenance Items Addressed: Mammogram  ordered, DEXA ordered, See Nurse Notes at the end of this note  Additional Screening:  Vision Screening: Recommended annual ophthalmology exams for early detection of glaucoma and other disorders of the eye. Would you like a referral to an eye doctor? No    Dental Screening: Recommended annual dental exams for proper oral hygiene  Community Resource Referral / Chronic Care Management: CRR required this visit?  No   CCM required this visit?  No   Plan:    I have personally reviewed and noted the following in the patient's chart:   Medical and social history Use of alcohol, tobacco or illicit drugs  Current medications and supplements including opioid prescriptions. Patient is not currently taking opioid  prescriptions. Functional ability and status Nutritional status Physical activity Advanced directives List of other physicians Hospitalizations, surgeries, and ER visits in previous 12 months Vitals Screenings to include cognitive, depression, and falls Referrals and appointments  In addition, I have reviewed and discussed with patient certain preventive protocols, quality metrics, and best practice recommendations. A written personalized care plan for preventive services as well as general preventive health recommendations were provided to patient.   Jaunita Messier, CMA   04/26/2024   After Visit Summary: (MyChart) Due to this being a telephonic visit, the after visit summary with patients personalized plan was offered to patient via MyChart   Notes:  6 CIT Score - 2 Needs 2nd shingles vaccine Placed order for MMG (due ~06/21/24) and DEXA scan (last done 2016)

## 2024-04-26 NOTE — Patient Instructions (Signed)
 Regina Macdonald , Thank you for taking time out of your busy schedule to complete your Annual Wellness Visit with me. I enjoyed our conversation and look forward to speaking with you again next year. I, as well as your care team,  appreciate your ongoing commitment to your health goals. Please review the following plan we discussed and let me know if I can assist you in the future. Your Game plan/ To Do List    Referrals: None   Follow up Visits: Next Medicare AWV with our clinical staff: 05/02/25 @ 2:00pm (PHONE VISIT)   Have you seen your provider in the last 6 months (3 months if uncontrolled diabetes)? Yes Next Office Visit with your provider: 05/05/24 @ 10:40 with Dr. Altamese Jest  Clinician Recommendations: I have placed an order for a mammogram (due 06/22/24) and bone density scan, please call [phone] to schedule at your earliest convenience. Get the 2nd Shingles vaccine if you have not had it. Aim for 30 minutes of exercise or brisk walking, 6-8 glasses of water, and 5 servings of fruits and vegetables each day.       This is a list of the screening recommended for you and due dates:  Health Maintenance  Topic Date Due   DEXA scan (bone density measurement)  03/18/2017   Zoster (Shingles) Vaccine (2 of 2) 05/28/2022   COVID-19 Vaccine (5 - 2024-25 season) 02/03/2024   Flu Shot  06/16/2024   Mammogram  06/21/2024   Medicare Annual Wellness Visit  04/26/2025   DTaP/Tdap/Td vaccine (4 - Td or Tdap) 04/02/2032   Pneumonia Vaccine  Completed   HPV Vaccine  Aged Out   Meningitis B Vaccine  Aged Out    Advanced directives: (In Chart) A copy of your advanced directives are scanned into your chart should your provider ever need it. Advance Care Planning is important because it:  [x]  Makes sure you receive the medical care that is consistent with your values, goals, and preferences  [x]  It provides guidance to your family and loved ones and reduces their decisional burden about whether or  not they are making the right decisions based on your wishes.  Follow the link provided in your after visit summary or read over the paperwork we have mailed to you to help you started getting your Advance Directives in place. If you need assistance in completing these, please reach out to us  so that we can help you!  See attachments for Preventive Care and Fall Prevention Tips.   Fall Prevention in the Home, Adult Falls can cause injuries and affect people of all ages. There are many simple things that you can do to make your home safe and to help prevent falls. If you need it, ask for help making these changes. What actions can I take to prevent falls? General information Use good lighting in all rooms. Make sure to: Replace any light bulbs that burn out. Turn on lights if it is dark and use night-lights. Keep items that you use often in easy-to-reach places. Lower the shelves around your home if needed. Move furniture so that there are clear paths around it. Do not keep throw rugs or other things on the floor that can make you trip. If any of your floors are uneven, fix them. Add color or contrast paint or tape to clearly mark and help you see: Grab bars or handrails. First and last steps of staircases. Where the edge of each step is. If you use a ladder  or stepladder: Make sure that it is fully opened. Do not climb a closed ladder. Make sure the sides of the ladder are locked in place. Have someone hold the ladder while you use it. Know where your pets are as you move through your home. What can I do in the bathroom?     Keep the floor dry. Clean up any water that is on the floor right away. Remove soap buildup in the bathtub or shower. Buildup makes bathtubs and showers slippery. Use non-skid mats or decals on the floor of the bathtub or shower. Attach bath mats securely with double-sided, non-slip rug tape. If you need to sit down while you are in the shower, use a non-slip  stool. Install grab bars by the toilet and in the bathtub and shower. Do not use towel bars as grab bars. What can I do in the bedroom? Make sure that you have a light by your bed that is easy to reach. Do not use any sheets or blankets on your bed that hang to the floor. Have a firm bench or chair with side arms that you can use for support when you get dressed. What can I do in the kitchen? Clean up any spills right away. If you need to reach something above you, use a sturdy step stool that has a grab bar. Keep electrical cables out of the way. Do not use floor polish or wax that makes floors slippery. What can I do with my stairs? Do not leave anything on the stairs. Make sure that you have a light switch at the top and the bottom of the stairs. Have them installed if you do not have them. Make sure that there are handrails on both sides of the stairs. Fix handrails that are broken or loose. Make sure that handrails are as long as the staircases. Install non-slip stair treads on all stairs in your home if they do not have carpet. Avoid having throw rugs at the top or bottom of stairs, or secure the rugs with carpet tape to prevent them from moving. Choose a carpet design that does not hide the edge of steps on the stairs. Make sure that carpet is firmly attached to the stairs. Fix any carpet that is loose or worn. What can I do on the outside of my home? Use bright outdoor lighting. Repair the edges of walkways and driveways and fix any cracks. Clear paths of anything that can make you trip, such as tools or rocks. Add color or contrast paint or tape to clearly mark and help you see high doorway thresholds. Trim any bushes or trees on the main path into your home. Check that handrails are securely fastened and in good repair. Both sides of all steps should have handrails. Install guardrails along the edges of any raised decks or porches. Have leaves, snow, and ice cleared regularly. Use  sand, salt, or ice melt on walkways during winter months if you live where there is ice and snow. In the garage, clean up any spills right away, including grease or oil spills. What other actions can I take? Review your medicines with your health care provider. Some medicines can make you confused or feel dizzy. This can increase your chance of falling. Wear closed-toe shoes that fit well and support your feet. Wear shoes that have rubber soles and low heels. Use a cane, walker, scooter, or crutches that help you move around if needed. Talk with your provider about other ways  that you can decrease your risk of falls. This may include seeing a physical therapist to learn to do exercises to improve movement and strength. Where to find more information Centers for Disease Control and Prevention, [employer]AD[employer]: TonerPromos.no [employer] on Aging: BaseRingTones.pl National Institute on Aging: BaseRingTones.pl Contact a health care provider if: You are afraid of falling at home. You feel weak, drowsy, or dizzy at home. You fall at home. Get help right away if you: Lose consciousness or have trouble moving after a fall. Have a fall that causes a head injury. These symptoms may be an emergency. Get help right away. Call 911. Do not wait to see if the symptoms will go away. Do not drive yourself to the hospital. This information is not intended to replace advice given to you by your health care provider. Make sure you discuss any questions you have with your health care provider. Document Revised: 07/06/2022 Document Reviewed: 07/06/2022 Elsevier Patient Education  2024 [employer].

## 2024-05-05 ENCOUNTER — Ambulatory Visit: Admitting: Family Medicine

## 2024-05-05 ENCOUNTER — Encounter: Payer: Self-pay | Admitting: Family Medicine

## 2024-05-05 VITALS — BP 126/68 | HR 72 | Ht 63.0 in | Wt 175.0 lb

## 2024-05-05 DIAGNOSIS — Z23 Encounter for immunization: Secondary | ICD-10-CM | POA: Diagnosis not present

## 2024-05-05 DIAGNOSIS — R011 Cardiac murmur, unspecified: Secondary | ICD-10-CM

## 2024-05-05 DIAGNOSIS — K219 Gastro-esophageal reflux disease without esophagitis: Secondary | ICD-10-CM | POA: Diagnosis not present

## 2024-05-05 DIAGNOSIS — B370 Candidal stomatitis: Secondary | ICD-10-CM

## 2024-05-05 DIAGNOSIS — G4733 Obstructive sleep apnea (adult) (pediatric): Secondary | ICD-10-CM | POA: Diagnosis not present

## 2024-05-05 DIAGNOSIS — F330 Major depressive disorder, recurrent, mild: Secondary | ICD-10-CM | POA: Diagnosis not present

## 2024-05-05 DIAGNOSIS — R0600 Dyspnea, unspecified: Secondary | ICD-10-CM | POA: Diagnosis not present

## 2024-05-05 DIAGNOSIS — J42 Unspecified chronic bronchitis: Secondary | ICD-10-CM | POA: Diagnosis not present

## 2024-05-05 DIAGNOSIS — E782 Mixed hyperlipidemia: Secondary | ICD-10-CM | POA: Diagnosis not present

## 2024-05-05 MED ORDER — NYSTATIN 100000 UNIT/ML MT SUSP: 5.0000 mL | Freq: Four times a day (QID) | OROMUCOSAL | 0 refills | Status: AC
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
## 2024-05-05 NOTE — Assessment & Plan Note (Signed)
Stable with Prozac

## 2024-05-05 NOTE — Assessment & Plan Note (Signed)
 Pt wanted to know if she can continue voquenza with protonix , discussed to continue  either one and check with the prescriber.

## 2024-05-05 NOTE — Assessment & Plan Note (Signed)
 On CPAP, Scheduled for CPAP study. Following Pulmonology

## 2024-05-05 NOTE — Assessment & Plan Note (Addendum)
 Lab Results  Component Value Date   LDLCALC 81 04/01/2023   On Lopid 

## 2024-05-06 LAB — CBC WITH DIFFERENTIAL/PLATELET
Basophils Absolute: 0.1 10*3/uL (ref 0.0–0.2)
Basos: 2 %
EOS (ABSOLUTE): 0.3 10*3/uL (ref 0.0–0.4)
Eos: 4 %
Hematocrit: 34.8 % (ref 34.0–46.6)
Hemoglobin: 10.9 g/dL — ABNORMAL LOW (ref 11.1–15.9)
Immature Grans (Abs): 0 10*3/uL (ref 0.0–0.1)
Immature Granulocytes: 0 %
Lymphocytes Absolute: 1.8 10*3/uL (ref 0.7–3.1)
Lymphs: 26 %
MCH: 29.8 pg (ref 26.6–33.0)
MCHC: 31.3 g/dL — ABNORMAL LOW (ref 31.5–35.7)
MCV: 95 fL (ref 79–97)
Monocytes Absolute: 0.5 10*3/uL (ref 0.1–0.9)
Monocytes: 7 %
Neutrophils Absolute: 4.3 10*3/uL (ref 1.4–7.0)
Neutrophils: 61 %
Platelets: 305 10*3/uL (ref 150–450)
RBC: 3.66 x10E6/uL — ABNORMAL LOW (ref 3.77–5.28)
RDW: 12.4 % (ref 11.7–15.4)
WBC: 7.1 10*3/uL (ref 3.4–10.8)

## 2024-05-06 LAB — COMPREHENSIVE METABOLIC PANEL WITH GFR
ALT: 11 IU/L (ref 0–32)
AST: 23 IU/L (ref 0–40)
Albumin: 4.2 g/dL (ref 3.7–4.7)
Alkaline Phosphatase: 123 IU/L — ABNORMAL HIGH (ref 44–121)
BUN/Creatinine Ratio: 21 (ref 12–28)
BUN: 15 mg/dL (ref 8–27)
Bilirubin Total: 0.3 mg/dL (ref 0.0–1.2)
CO2: 21 mmol/L (ref 20–29)
Calcium: 9.4 mg/dL (ref 8.7–10.3)
Chloride: 103 mmol/L (ref 96–106)
Creatinine, Ser: 0.73 mg/dL (ref 0.57–1.00)
Globulin, Total: 2.8 g/dL (ref 1.5–4.5)
Glucose: 84 mg/dL (ref 70–99)
Potassium: 4.9 mmol/L (ref 3.5–5.2)
Sodium: 141 mmol/L (ref 134–144)
Total Protein: 7 g/dL (ref 6.0–8.5)
eGFR: 81 mL/min/{1.73_m2} (ref >59)

## 2024-05-08 ENCOUNTER — Ambulatory Visit: Payer: Self-pay | Admitting: Family Medicine

## 2024-05-18 ENCOUNTER — Encounter (INDEPENDENT_AMBULATORY_CARE_PROVIDER_SITE_OTHER): Admitting: Internal Medicine

## 2024-05-18 DIAGNOSIS — G4733 Obstructive sleep apnea (adult) (pediatric): Secondary | ICD-10-CM | POA: Diagnosis not present

## 2024-05-31 NOTE — Procedures (Signed)
 [facility]  Polysomnogram Report Part I                                                               Phone: [phone] Fax: [phone]  Patient Name: Regina Macdonald, Regina Macdonald. Acquisition Number: [id number]  Date of Birth: [birth year] Acquisition Date: 05/18/2024  Referring Physician: Elfreda RONAL Bathe, MD     History: The patient is a 83 year old  who was referred for re-evaluation of possible sleep apnea with snoring and excessive daytime sleepiness. Medical History: Anxiety, asthma, cancer, COPD, depression, deverticula,colon , diverticulosis, GERD, sleep apnea.  Medications: acetaminophen , albuterol , aspirin , calcium  & magnesium carbonates po, hydrocodone, fluoxetine , fluticasone , gemfibrozil , ibuprofen, ipratropium-albuterol , levofloxacin , loratadine , multiple vitamins, pantoprazole , polyethylene glycol, prednisone ,  Procedure: This routine overnight polysomnogram was performed on the [facility] 5 using the standard diagnostic protocol. This included 6 channels of EEG, 2 channels of EOG, chin EMG, bilateral anterior tibialis EMG, nasal/oral thermistor, PTAF (nasal pressure transducer), chest and abdominal wall movements, EKG, and pulse oximetry.  Description: The total recording time was 412.0 minutes. The total sleep time was 271.0 minutes. There were a total of 85.5 minutes of wakefulness after sleep onset for a reducedsleep efficiency of 65.8%. The latency to sleep onset wasprolonged at 55.5 minutes. The R sleep onset latency was N/A.  Sleep parameters, as a percentage of the total sleep time, demonstrated 24.9% of sleep was in N1 sleep, 74.7% N2, 0.4% N3 and 0.0% R sleep. There were a total of 234 arousals for an arousal index of 51.8 arousals per hour of sleep that waselevated.  Respiratory monitoring demonstrated   snoring . There were 41 apneas and hypopneas for an Apnea Hypopnea Index of 9.1 apneas and hypopneas per hour of sleep.   The average duration of the respiratory events was 12.9  seconds with a maximum duration of 21.5 seconds. The respiratory events occurred mostly in the supine position with an AHI of 31.3. The respiratory events were associated with peripheral oxygen  desaturations on the average to 90%. The lowest oxygen  desaturation associated with a respiratory event was 84%. Additionally, the baseline oxygen  saturation during wakefulness was 96%, during NREM sleep averaged 93%, and during REM sleep averaged N/A. The total duration of oxygen  < 90% was 3.3 minutes.  Cardiac monitoring-  demonstrate transient cardiac decelerations associated with the apneas.  significant cardiac rhythm irregularities.   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     Impression: This routine overnight polysomnogram confirmed the presence of significant, position-dependent obstructive sleep apnea with an overall Apnea Hypopnea Index of 9.1 apneas and hypopneas per hour of sleep, which increased to 31.3 in the supine position. The lowest desaturation was to 84%.  As REM sleep was not observed and as the patient is using CPAP at home, the findings may underestimate the severity of the sleep apnea.   There was a significantly elevated periodic limb movement index of 56.5 periodic limb movements per hour of sleep. Sometimes these limb movements subside once the apnea is controlled. Clinical correlation is suggested.  reduced sleep efficiency with a prolonged sleep latency. Anelevated arousal index,increased awakenings, a reduced percentage of slow wave and  no REM sleep.These findings would appear to be due to the combination of obstructive sleep apnea and periodic limb movements.   Recommendations:    A CPAP titration would be recommended for the sleep apnea. Some supine sleep should be ensured to optimize the titration. Would recommend weight loss in a patient  with a BMI of 32.4.  Alternative treatment options may include: avoiding the supine sleep position, a nasal resistance device, an oral appliance or ENT surgery in the appropriate clinical context.     Elfreda RONAL Bathe, MD, [facility] Diplomate ABMS-Pulmonary, Critical Care and Sleep Medicine  Electronically reviewed and digitally signed  [facility] Polysomnogram Report Part II  Phone: [phone] Fax: [phone]  Patient last name Drohan Neck Size  16.0 in. Acquisition [id number]  Patient first name Regina Macdonald. Weight 177.0 lbs. Started 05/18/2024 at 9:51:01 PM  Birth date July 11, 1941 Height 62.0 in. Stopped 05/19/2024 at 4:52:07 AM  Age 89 BMI 32.4 lb/in2 Duration 412.0  Study Type Adult      Raford Sprang. RPSGT. // Daril Burr   Reviewed by: Marval MATSU. Henke, PhD, ABSM, FAASM Sleep Data: Lights Out: 9:57:01 PM Sleep Onset: 10:52:31 PM  Lights On: 4:49:01 AM Sleep Efficiency: 65.8 %  Total Recording Time: 412.0 min Sleep Latency (from Lights Off) 55.5 min  Total Sleep Time (TST): 271.0 min R Latency (from Sleep Onset): N/A  Sleep Period Time: 344.5 min Total number of awakenings: 22  Wake during sleep: 73.5 min Wake After Sleep Onset (WASO): 85.5 min   Sleep Data:         Arousal Summary: Stage  Latency from lights out (min) Latency from sleep onset (min) Duration (min) % Total Sleep Time  Normal values  N 1 55.5 0.0 67.5 24.9 (5%)  N 2 62.0 6.5 202.5 74.7 (50%)  N 3 136.0 80.5 1.0 0.4 (20%)  R N/A N/A 0.0 0.0 (25%)   Number Index  Spontaneous 122 27.0  Apneas & Hypopneas 11 2.4  RERAs 0 0.0       (Apneas & Hypopneas & RERAs)  (11) (2.4)  Limb Movement 105 23.2  Snore 0 0.0  TOTAL 238 52.7     Respiratory Data:  CA OA MA Apnea Hypopnea* A+ H RERA Total  Number 1 19 0 20 21 41 0 41  Mean Dur (sec) 14.5 11.6 0.0 11.8 14.0 12.9 0.0 12.9  Max Dur (sec) 14.5 17.0 0.0 17.0 21.5 21.5 0.0 21.5  Total Dur (min) 0.2 3.7 0.0 3.9 4.9 8.8 0.0 8.8  % of TST 0.1 1.4 0.0 1.4  1.8 3.3 0.0 3.3  Index (#/h TST) 0.2 4.2 0.0 4.4 4.6 9.1 0.0 9.1  *Hypopneas scored based on 4% or greater desaturation.  Sleep Stage:        REM NREM TST  AHI N/A 8.9 9.1  RDI N/A 8.9 9.1           Body Position Data:  Sleep (min) TST (%) REM (min) NREM (min) CA (#) OA (#) MA (#) HYP (#) AHI (#/h) RERA (#) RDI (#/h) Desat (#)  Supine 59.5 21.96 0.0 59.5 1 17  0 13 31.3 0 31.3 27  Non-Supine 211.50 78.04 0.00 211.50 0.00 2.00 0.00 8.00 2.84 0 2.84 11.00  Right: 211.5 78.04 0.0 211.5 0 2 0 8 2.8 0 2.8 11     Snoring: Total number of snoring episodes  0  Total time with snoring    min (   % of sleep)  Oximetry Distribution:             WK REM NREM TOTAL  Average (%)   96    93 94  < 90% 0.0 0.0 3.3 3.3  < 80% 0.0 0.0 0.0 0.0  < 70% 0.0 0.0 0.0 0.0  # of Desaturations* 3 0 35 38  Desat Index (#/hour) 1.3    7.7 8.4  Desat Max (%) 6 0 8 8  Desat Max Dur (sec) 87.0 0.0 39.0 87.0  Approx Min O2 during sleep 84  Approx min O2 during a respiratory event 84  Was Oxygen  added (Y/N) and final rate :    LPM  *Desaturations based on 4% or greater drop from baseline.   Cheyne Stokes Breathing: None Present    Heart Rate Summary:  Average Heart Rate During Sleep 64.4 bpm      Highest Heart Rate During Sleep (95th %) 72.0 bpm      Highest Heart Rate During Sleep 154 bpm (artifact)  Highest Heart Rate During Recording (TIB) 214 bpm (artifact)   Heart Rate Observations: Event Type # Events   Bradycardia 0 Lowest HR Scored: N/A  Sinus Tachycardia During Sleep 0 Highest HR Scored: N/A  Narrow Complex Tachycardia 0 Highest HR Scored: N/A  Wide Complex Tachycardia 0 Highest HR Scored: N/A  Asystole 0 Longest Pause: N/A  Atrial Fibrillation 0 Duration Longest Event: N/A  Other Arrythmias   Type:    Periodic Limb Movement Data: (Primary legs unless otherwise noted) Total # Limb Movement 264 Limb Movement Index 58.5  Total # PLMS 255 PLMS Index 56.5  Total # PLMS  Arousals 102 PLMS Arousal Index 22.6  Percentage Sleep Time with PLMS 111.62min (41.0 % sleep)  Mean Duration limb movements (secs) 303.1

## 2024-06-06 ENCOUNTER — Encounter: Payer: Self-pay | Admitting: Internal Medicine

## 2024-06-06 ENCOUNTER — Ambulatory Visit (INDEPENDENT_AMBULATORY_CARE_PROVIDER_SITE_OTHER): Admitting: Internal Medicine

## 2024-06-06 VITALS — BP 146/81 | HR 88 | Temp 98.0°F | Resp 16 | Ht 63.0 in | Wt 172.8 lb

## 2024-06-06 DIAGNOSIS — G4733 Obstructive sleep apnea (adult) (pediatric): Secondary | ICD-10-CM | POA: Diagnosis not present

## 2024-06-06 DIAGNOSIS — J4489 Other specified chronic obstructive pulmonary disease: Secondary | ICD-10-CM

## 2024-06-06 DIAGNOSIS — R053 Chronic cough: Secondary | ICD-10-CM

## 2024-06-06 MED ORDER — TRELEGY ELLIPTA 100-62.5-25 MCG/ACT IN AEPB: 1.0000 | INHALATION_SPRAY | Freq: Every day | RESPIRATORY_TRACT | 11 refills | Status: DC

## 2024-06-06 NOTE — Progress Notes (Signed)
 [facility] [address] [place], [place] [address]  Pulmonary Sleep Medicine   Office Visit Note  Patient Name: Regina Macdonald DOB: [birth year] MRN [id number]  Date of Service: 06/06/2024  Complaints/HPI: She has been having hoarseness of her voice. Patient states she has been having cough still. She wants to know about trying something else. She has noted some dryness. She states her sputum is basically clear. Patient has no congestion noted. Denies fevers or chills no chest pain noted.   Office Spirometry Results:     ROS  General: (-) fever, (-) chills, (-) night sweats, (-) weakness Skin: (-) rashes, (-) itching,. Eyes: (-) visual changes, (-) redness, (-) itching. Nose and Sinuses: (-) nasal stuffiness or itchiness, (-) postnasal drip, (-) nosebleeds, (-) sinus trouble. Mouth and Throat: (-) sore throat, (-) hoarseness. Neck: (-) swollen glands, (-) enlarged thyroid, (-) neck pain. Respiratory: + cough, (-) bloody sputum, + shortness of breath, + wheezing. Cardiovascular: - ankle swelling, (-) chest pain. Lymphatic: (-) lymph node enlargement. Neurologic: (-) numbness, (-) tingling. Psychiatric: (-) anxiety, (-) depression   Current Medication: Outpatient Encounter Medications as of 06/06/2024  Medication Sig Note   acetaminophen  (TYLENOL ) 500 MG tablet Take 500 mg by mouth every 6 (six) hours as needed.    albuterol  (VENTOLIN  HFA) 108 (90 Base) MCG/ACT inhaler INHALE 2 PUFFS INTO THE LUNGS EVERY 6 HOURS AS NEEDED FOR WHEEZING OR SHORTNESS OF BREATH    aspirin  81 MG chewable tablet Chew 81 mg by mouth daily.    budeson-glycopyrrolate-formoterol (BREZTRI  AEROSPHERE) 160-9-4.8 MCG/ACT AERO inhaler Inhale 2 puffs into the lungs 2 (two) times daily.    CALCIUM  & MAGNESIUM CARBONATES PO Take 1 tablet by mouth daily.    diphenhydramine-acetaminophen  (TYLENOL  PM) 25-500 MG TABS tablet Take 1 tablet by mouth at bedtime.    EQUATE STOOL SOFTENER 100 MG capsule Take  1 capsule by mouth twice daily    FLUoxetine  (PROZAC ) 20 MG capsule TAKE 1 CAPSULE(20 MG) BY MOUTH DAILY    fluticasone  (FLONASE ) 50 MCG/ACT nasal spray SHAKE LIQUID AND USE 2 SPRAYS IN EACH NOSTRIL DAILY    gemfibrozil  (LOPID ) 600 MG tablet TAKE 1 TABLET(600 MG) BY MOUTH TWICE DAILY    ipratropium-albuterol  (DUONEB) 0.5-2.5 (3) MG/3ML SOLN USE 3 ML VIA NEBULIZER EVERY 6 HOURS AS NEEDED 04/26/2024: Uses 3 times per day   loratadine  (EQ LORATADINE ) 10 MG tablet Take 1 tablet (10 mg total) by mouth daily.    Multiple Vitamins-Minerals (CENTRUM SILVER PO) Take 1 capsule by mouth daily.    pantoprazole  (PROTONIX ) 40 MG tablet TAKE 1 TABLET(40 MG) BY MOUTH DAILY    polyethylene glycol (MIRALAX ) packet Take 17 g by mouth daily. 04/26/2024: Uses every other day   Vonoprazan Fumarate  (VOQUEZNA ) 10 MG TABS Take 10 mg by mouth daily. 04/26/2024: Hasn't started yet   No facility-administered encounter medications on file as of 06/06/2024.    Surgical History: Past Surgical History:  Procedure Laterality Date   ABDOMINAL HYSTERECTOMY     COLONOSCOPY  06/08/2013   Dr Ora- small mouth diverticula   HIP SURGERY Left    KNEE SURGERY Bilateral    NASAL RECONSTRUCTION     x 2    Medical History: Past Medical History:  Diagnosis Date   Anxiety    Asthma    Cancer (HCC) cervical-1973   COPD (chronic obstructive pulmonary disease) (HCC)    Depression    Diverticula, colon    Diverticulosis    GERD (gastroesophageal reflux  disease)    Rectal bleeding 03/29/2014   Sleep apnea     Family History: Family History  Problem Relation Age of Onset   Cirrhosis Father    Coronary artery disease Mother    Breast cancer Neg Hx     Social History: Social History   Socioeconomic History   Marital status: Divorced    Spouse name: Not on file   Number of children: 3   Years of education: 12   Highest education level: High school graduate  Occupational History   Occupation: retired  Tobacco Use    Smoking status: Former    Current packs/day: 0.00    Types: Cigarettes    Quit date: 1971    Years since quitting: 54.5    Passive exposure: Never   Smokeless tobacco: Never   Tobacco comments:    04/26/24 Never inhaled when she was smoking. Could not tell me how long she smoked  Vaping Use   Vaping status: Never Used  Substance and Sexual Activity   Alcohol use: Not Currently   Drug use: Never   Sexual activity: Not Currently    Partners: Male    Birth control/protection: Post-menopausal  Other Topics Concern   Not on file  Social History Narrative   Pt lives with her daughter   2 children living and 1 deceased   Social Drivers of Corporate investment banker Strain: Low Risk  (04/26/2024)   Overall Financial Resource Strain (CARDIA)    Difficulty of Paying Living Expenses: Not hard at all  Food Insecurity: No Food Insecurity (04/26/2024)   Hunger Vital Sign    Worried About Running Out of Food in the Last Year: Never true    Ran Out of Food in the Last Year: Never true  Transportation Needs: No Transportation Needs (04/26/2024)   PRAPARE - Administrator, Civil Service (Medical): No    Lack of Transportation (Non-Medical): No  Physical Activity: Inactive (04/26/2024)   Exercise Vital Sign    Days of Exercise per Week: 0 days    Minutes of Exercise per Session: 0 min  Stress: No Stress Concern Present (04/26/2024)   [employer] of Occupational Health - Occupational Stress Questionnaire    Feeling of Stress : Not at all  Social Connections: Socially Isolated (04/26/2024)   Social Connection and Isolation Panel    Frequency of Communication with Friends and Family: More than three times a week    Frequency of Social Gatherings with Friends and Family: Twice a week    Attends Religious Services: Never    Database administrator or Organizations: No    Attends Banker Meetings: Never    Marital Status: Divorced  Catering manager Violence: Not At  Risk (04/26/2024)   Humiliation, Afraid, Rape, and Kick questionnaire    Fear of Current or Ex-Partner: No    Emotionally Abused: No    Physically Abused: No    Sexually Abused: No    Vital Signs: Blood pressure (!) 146/81, pulse 88, temperature 98 F (36.7 C), resp. rate 16, height 5' 3 (1.6 m), weight 172 lb 12.8 oz (78.4 kg), SpO2 97%.  Examination: General Appearance: The patient is well-developed, well-nourished, and in no distress. Skin: Gross inspection of skin unremarkable. Head: normocephalic, no gross deformities. Eyes: no gross deformities noted. ENT: ears appear grossly normal no exudates. Neck: Supple. No thyromegaly. No LAD. Respiratory: no rhonchi noted. Cardiovascular: Normal S1 and S2 without murmur or rub. Extremities: No  cyanosis. pulses are equal. Neurologic: Alert and oriented. No involuntary movements.  LABS: Recent Results (from the past 2160 hours)  CBC with Differential     Status: Abnormal   Collection Time: 05/05/24 11:51 AM  Result Value Ref Range   WBC 7.1 3.4 - 10.8 x10E3/uL   RBC 3.66 (L) 3.77 - 5.28 x10E6/uL   Hemoglobin 10.9 (L) 11.1 - 15.9 g/dL   Hematocrit 65.1 65.9 - 46.6 %   MCV 95 79 - 97 fL   MCH 29.8 26.6 - 33.0 pg   MCHC 31.3 (L) 31.5 - 35.7 g/dL   RDW 87.5 88.2 - 84.5 %   Platelets 305 150 - 450 x10E3/uL   Neutrophils 61 Not Estab. %   Lymphs 26 Not Estab. %   Monocytes 7 Not Estab. %   Eos 4 Not Estab. %   Basos 2 Not Estab. %   Neutrophils Absolute 4.3 1.4 - 7.0 x10E3/uL   Lymphocytes Absolute 1.8 0.7 - 3.1 x10E3/uL   Monocytes Absolute 0.5 0.1 - 0.9 x10E3/uL   EOS (ABSOLUTE) 0.3 0.0 - 0.4 x10E3/uL   Basophils Absolute 0.1 0.0 - 0.2 x10E3/uL   Immature Granulocytes 0 Not Estab. %   Immature Grans (Abs) 0.0 0.0 - 0.1 x10E3/uL  Comprehensive Metabolic Panel (CMET)     Status: Abnormal   Collection Time: 05/05/24 11:51 AM  Result Value Ref Range   Glucose 84 70 - 99 mg/dL   BUN 15 8 - 27 mg/dL   Creatinine, Ser 9.26 0.57  - 1.00 mg/dL   eGFR 81 >40 fO/fpw/8.26   BUN/Creatinine Ratio 21 12 - 28   Sodium 141 134 - 144 mmol/L   Potassium 4.9 3.5 - 5.2 mmol/L   Chloride 103 96 - 106 mmol/L   CO2 21 20 - 29 mmol/L   Calcium  9.4 8.7 - 10.3 mg/dL   Total Protein 7.0 6.0 - 8.5 g/dL   Albumin 4.2 3.7 - 4.7 g/dL   Globulin, Total 2.8 1.5 - 4.5 g/dL   Bilirubin Total 0.3 0.0 - 1.2 mg/dL   Alkaline Phosphatase 123 (H) 44 - 121 IU/L   AST 23 0 - 40 IU/L   ALT 11 0 - 32 IU/L    Radiology: DG Chest 2 View Result Date: 01/18/2024 CLINICAL DATA:  Cough. EXAM: CHEST - 2 VIEW COMPARISON:  February 10, 2023. FINDINGS: The heart size and mediastinal contours are within normal limits. Both lungs are clear. Stable elevated right hemidiaphragm. The visualized skeletal structures are unremarkable. IMPRESSION: No active cardiopulmonary disease. Electronically Signed   By: Lynwood Landy Raddle M.D.   On: 01/18/2024 17:05    No results found.  No results found.  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 Obstructive chronic bronchitis without exacerbation (HCC) (Primary) Will change the breztri  to trelegy and see how she does - Fluticasone -Umeclidin-Vilant (TRELEGY ELLIPTA ) 100-62.5-25 MCG/ACT AEPB; Inhale 1 puff into the lungs daily.  Dispense: 1 each; Refill: 11  2. OSA (obstructive  sleep apnea) On CPAP continue with the current pressures  3. Chronic  cough She has been dried out and probably making her cough. Stop fan on face and keep water chamber clean  Will also get a CT chest for evaluation  General Counseling: I have discussed the findings of the evaluation and examination with Regina Macdonald.  I have also discussed any further diagnostic evaluation thatmay be needed or ordered today. Regina Macdonald verbalizes understanding of the findings of todays visit. We also reviewed her medications today and discussed drug interactions and side effects including but not limited excessive drowsiness and altered mental states. We also discussed that there is always a risk not just to her but also people around her. she has been encouraged to call the office with any questions or concerns that should arise related to todays visit.  No orders of the defined types were placed in this encounter.    Time spent: 46  I have personally obtained a history, examined the patient, evaluated laboratory and imaging results, formulated the assessment and plan and placed orders.    Regina DELENA Bathe, MD [facility] Pulmonary and Critical Care Sleep medicine

## 2024-06-07 ENCOUNTER — Telehealth: Payer: Self-pay | Admitting: Internal Medicine

## 2024-06-07 NOTE — Telephone Encounter (Addendum)
Notified patient of chest CT appointment date, arrival time, location-[place]

## 2024-06-12 ENCOUNTER — Telehealth: Payer: Self-pay | Admitting: Family Medicine

## 2024-06-12 NOTE — Telephone Encounter (Unsigned)
 Copied from CRM [id number]. Topic: Clinical - Medication Refill >> Jun 12, 2024  3:20 PM Ivette P wrote: Medication: FLUoxetine  (PROZAC ) 20 MG capsule loratadine  (EQ LORATADINE ) 10 MG tablet  Has the patient contacted their pharmacy? Yes (Agent: If no, request that the patient contact the pharmacy for the refill. If patient does not wish to contact the pharmacy document the reason why and proceed with request.) (Agent: If yes, when and what did the pharmacy advise?)  This is the patient's preferred pharmacy:  [id number] [place], [place] - [address] AT [facility] [address] [place] [address] [place] [address] Phone: [phone] Fax: [phone]   Is this the correct pharmacy for this prescription? Yes If no, delete pharmacy and type the correct one.   Has the prescription been filled recently? No  Is the patient out of the medication? Yes  Has the patient been seen for an appointment in the last year OR does the patient have an upcoming appointment? Yes, 11/06/2024  Can we respond through MyChart? No  Agent: Please be advised that Rx refills may take up to 3 business days. We ask that you follow-up with your pharmacy.

## 2024-06-14 ENCOUNTER — Ambulatory Visit
Admission: RE | Admit: 2024-06-14 | Discharge: 2024-06-14 | Disposition: A | Discharge status: Home / Self Care | Source: Ambulatory Visit | Attending: Internal Medicine | Admitting: Internal Medicine

## 2024-06-14 ENCOUNTER — Other Ambulatory Visit: Payer: Self-pay | Admitting: Family Medicine

## 2024-06-14 DIAGNOSIS — R053 Chronic cough: Secondary | ICD-10-CM | POA: Diagnosis present

## 2024-06-14 DIAGNOSIS — F320 Major depressive disorder, single episode, mild: Secondary | ICD-10-CM

## 2024-06-14 DIAGNOSIS — F419 Anxiety disorder, unspecified: Secondary | ICD-10-CM

## 2024-06-14 NOTE — Telephone Encounter (Signed)
 Copied from CRM [id number]. Topic: Clinical - Prescription Issue >> Jun 14, 2024  1:07 PM Tiffini S wrote: Reason for CRM: Patient called stating that she has been sneezing and needs the medication refills today for  loratadine  (EQ LORATADINE ) 10 MG tablet and FLUoxetine  (PROZAC ) 20 MG capsule.

## 2024-06-15 MED ORDER — LORATADINE 10 MG PO TABS: 10.0000 mg | ORAL_TABLET | Freq: Every day | ORAL | 1 refills | Status: DC

## 2024-06-15 MED ORDER — FLUOXETINE HCL 20 MG PO CAPS: 20.0000 mg | ORAL_CAPSULE | Freq: Every day | ORAL | 2 refills | Status: DC

## 2024-06-15 NOTE — Telephone Encounter (Signed)
 Patient was made aware prescriptions were refilled. Verbalized understanding.

## 2024-06-15 NOTE — Telephone Encounter (Unsigned)
 Copied from CRM #[id number]. Topic: Clinical - Medication Question >> Jun 15, 2024  9:49 AM Leonette SQUIBB wrote: Reason for CRM: pt called saying she put a refill request in for her Prozac  and for the Claritin  and has not received them yet.  I looked and there are refill request in the box.  She is just concerned because it has been since Monday that she asked for them.

## 2024-06-15 NOTE — Telephone Encounter (Signed)
 Copied from CRM #[id number]. Topic: Clinical - Medication Question >> Jun 15, 2024  9:49 AM Leonette SQUIBB wrote: Reason for CRM: pt called saying she put a refill request in for her Prozac  and for the Claritin  and has not received them yet.  I looked and there are refill request in the box.  She is just concerned because it has been since Monday that she asked for them.

## 2024-06-23 ENCOUNTER — Encounter (INDEPENDENT_AMBULATORY_CARE_PROVIDER_SITE_OTHER): Payer: Self-pay | Admitting: Internal Medicine

## 2024-06-23 DIAGNOSIS — G4733 Obstructive sleep apnea (adult) (pediatric): Secondary | ICD-10-CM | POA: Diagnosis not present

## 2024-06-26 ENCOUNTER — Ambulatory Visit
Admission: RE | Admit: 2024-06-26 | Discharge: 2024-06-26 | Disposition: A | Discharge status: Home / Self Care | Source: Ambulatory Visit | Attending: Family Medicine | Admitting: Family Medicine

## 2024-06-26 DIAGNOSIS — Z780 Asymptomatic menopausal state: Secondary | ICD-10-CM

## 2024-06-26 DIAGNOSIS — Z1231 Encounter for screening mammogram for malignant neoplasm of breast: Secondary | ICD-10-CM | POA: Diagnosis present

## 2024-06-27 ENCOUNTER — Ambulatory Visit: Payer: Self-pay | Admitting: Family Medicine

## 2024-07-04 ENCOUNTER — Ambulatory Visit: Payer: Medicare Other | Admitting: Internal Medicine

## 2024-07-06 ENCOUNTER — Other Ambulatory Visit: Payer: Self-pay | Admitting: Family Medicine

## 2024-07-06 ENCOUNTER — Telehealth: Payer: Self-pay | Admitting: Family Medicine

## 2024-07-06 DIAGNOSIS — E785 Hyperlipidemia, unspecified: Secondary | ICD-10-CM

## 2024-07-06 MED ORDER — GEMFIBROZIL 600 MG PO TABS: 600.0000 mg | ORAL_TABLET | Freq: Two times a day (BID) | ORAL | 0 refills | Status: DC

## 2024-07-06 NOTE — Telephone Encounter (Signed)
Patient has been aware

## 2024-07-06 NOTE — Telephone Encounter (Signed)
 Please review medication refill request

## 2024-07-06 NOTE — Telephone Encounter (Signed)
 Sent!

## 2024-07-06 NOTE — Telephone Encounter (Unsigned)
 Copied from CRM #[id number]. Topic: Clinical - Medication Refill >> Jul 06, 2024  8:22 AM Myrick T wrote: Medication: gemfibrozil  (LOPID ) 600 MG tablet  Has the patient contacted their pharmacy? Yes ( This is the patient's preferred pharmacy:  [id number] - [place], [place] - [address] AT [facility] OF [address] [place] [address] [place] [address] Phone: [phone] Fax: [phone]  Is this the correct pharmacy for this prescription? Yes  Has the prescription been filled recently? Yes  Is the patient out of the medication? Yes  Has the patient been seen for an appointment in the last year OR does the patient have an upcoming appointment? Yes  Can we respond through MyChart? Yes  Agent: Please be advised that Rx refills may take up to 3 business days. We ask that you follow-up with your pharmacy.

## 2024-07-06 NOTE — Telephone Encounter (Signed)
 Unable to pend, not on current med list, last filled 01/11/24

## 2024-07-12 ENCOUNTER — Ambulatory Visit
Admission: RE | Admit: 2024-07-12 | Discharge: 2024-07-12 | Disposition: A | Discharge status: Home / Self Care | Source: Ambulatory Visit | Attending: Family Medicine | Admitting: Family Medicine

## 2024-07-12 DIAGNOSIS — R011 Cardiac murmur, unspecified: Secondary | ICD-10-CM | POA: Insufficient documentation

## 2024-07-12 DIAGNOSIS — I081 Rheumatic disorders of both mitral and tricuspid valves: Secondary | ICD-10-CM | POA: Diagnosis not present

## 2024-07-12 DIAGNOSIS — R0600 Dyspnea, unspecified: Secondary | ICD-10-CM | POA: Diagnosis not present

## 2024-07-12 DIAGNOSIS — J449 Chronic obstructive pulmonary disease, unspecified: Secondary | ICD-10-CM | POA: Diagnosis not present

## 2024-07-12 DIAGNOSIS — G4730 Sleep apnea, unspecified: Secondary | ICD-10-CM | POA: Diagnosis not present

## 2024-07-12 LAB — ECHOCARDIOGRAM COMPLETE
AR max vel: 2.79 cm2
AV Area VTI: 3.66 cm2
AV Area mean vel: 2.74 cm2
AV Mean grad: 5 mmHg
AV Peak grad: 8.8 mmHg
Ao pk vel: 1.49 m/s
Area-P 1/2: 2.21 cm2
MV VTI: 4.14 cm2
S' Lateral: 1.98 cm

## 2024-07-12 NOTE — Progress Notes (Signed)
*  PRELIMINARY RESULTS* Echocardiogram 2D Echocardiogram has been performed.  Regina Macdonald 07/12/2024, 11:22 AM

## 2024-07-13 NOTE — Procedures (Signed)
 [facility]  Polysomnogram Report Part I  Phone: [phone] Fax: [phone]  Patient Name: Regina Macdonald, Regina Macdonald. Acquisition Number: [id number]  Date of Birth: [birth year] Acquisition Date: 06/23/2024  Referring Physician: Elfreda RONAL Bathe MD     History: The patient is a 83 year old  . Medical History: anxiety, asthma, cancer, COPD, depression, diverticulosis, GERD, sleep apnea.  Medications: acetaminophen , albuterol , aspirin , calcium  & magnesium carbonates po, hydrocodone, fluoxetine , fluticasone , gemfibrozil , ibuprofen, ipratropium-albuterol , levofloxacin , loratadine , multiple vitamins, pantoprazole , polyethylene glycol, prednisone ,  Procedure: This routine overnight polysomnogram was performed on the Alice 5 using the standard CPAP protocol. This included 6 channels of EEG, 2 channels of EOG, chin EMG, bilateral anterior tibialis EMG, nasal/oral thermistor, PTAF (nasal pressure transducer), chest and abdominal wall movements, EKG, and pulse oximetry.  Description: The total recording time was 413.5 minutes. The total sleep time was 305.0 minutes. There were a total of 88.5 minutes of wakefulness after sleep onset for a reducedsleep efficiency of 73.8%. The latency to sleep onset was within normal limits at 20.0 minutes. The R sleep onset latency was N/A.  Sleep parameters, as a percentage of the total sleep time, demonstrated 35.1% of sleep was in N1 sleep, 64.8% N2, 0.2% N3 and 0.0% R sleep. There were a total of 283 arousals for an arousal index of 55.7 arousals per hour of sleep that was elevated.  Overall, there were a total of 10 respiratory events for a respiratory disturbance index, which includes apneas, hypopneas and RERAs (increased respiratory effort) of 2.0 respiratory events per hour of sleep during the pressure titration.  was initiated at 4 cm H2O at lights out, 10:27 p.m. It was titrated in 1-2 cm increments for intermittent respiratory events to the final pressure of 8 cm  H2O. The apnea was controlled at the final pressure and supine, but no REM sleep were observed.  Additionally, the baseline oxygen  saturation during wakefulness was 95%, during NREM sleep averaged 94%, and during REM sleep averaged N/A. The total duration of oxygen  < 90% was 0.3 minutes.  Cardiac monitoring-   significant cardiac rhythm irregularities.   Periodic limb movement monitoring- demonstrated that there were 227 periodic limb movements for a periodic limb movement index of 44.7 periodic limb movements per hour of sleep.      Impression: This patient's obstructive sleep apnea demonstrated significant improvement with the utilization of nasal  at 8 cm H2O however REM sleep was not observed.   There was a significantly elevated periodic limb movement index of 44.7 periodic limb movements per hour of sleep. These limb movements were also observed during the prior PSG. Treatment may be indicated if sleep disruption or sleepiness persists once the patient is fully compliant with CPAP.  Recommendations: Would recommend utilization of auto-adjusting  at 8-18 cm H2O.      Evaluation for possible restless leg syndrome is suggested. An AirFit F40 mask, size Small Wide, was used. Chin strap used during study- No. Humidifier used during study- Yes.     Elfreda RONAL Bathe, MD, [facility] Diplomate ABMS-Pulmonary, Critical Care and Sleep Medicine  Electronically reviewed and digitally signed  [facility] CPAP/BIPAP Polysomnogram Report Part II Phone: [phone] Fax: [phone]  Patient last name Masek Neck Size 16.0   in. Acquisition [id number]  Patient first name Regina Macdonald. Weight 177.0 lbs. Started 06/23/2024 at 10:03:36 PM  Birth date [birth year] Height 62.0 in. Stopped 06/24/2024 at 5:25:12 AM  Age 59      Type Adult BMI  32.4 lb/in2 Duration 413.5  Raford Sprang RPSGT // Daril Burr  Reviewed by: Kathe G. Henke, PhD, ABSM, FAASM Sleep Data: Lights Out: 10:27:36 PM Sleep Onset:  10:47:36 PM  Lights On: 5:21:06 AM Sleep Efficiency: 73.8 %  Total Recording Time: 413.5 min Sleep Latency (from Lights Off) 20.0 min  Total Sleep Time (TST): 305.0 min R Latency (from Sleep Onset): N/A  Sleep Period Time: 355.0 min Total number of awakenings: 10  Wake during sleep: 50.0 min Wake After Sleep Onset (WASO): 88.5 min   Sleep Data:         Arousal Summary: Stage  Latency from lights out (min) Latency from sleep onset (min) Duration (min) % Total Sleep Time  Normal values  N 1 20.0 0.0 107.0 35.1 (5%)  N 2 21.0 1.0 197.5 64.8 (50%)  N 3 126.5 106.5 0.5 0.2 (20%)  R N/A N/A 0.0 0.0 (25%)   Number Index  Spontaneous 196 38.6  Apneas & Hypopneas 1 0.2  RERAs 0 0.0       (Apneas & Hypopneas & RERAs)  (1) (0.2)  Limb Movement 87 17.1  Snore 0 0.0  TOTAL 284 55.9     Respiratory Data:  CA OA MA Apnea Hypopnea* A+ H RERA Total  Number 0 1 0 1 9 10  0 10  Mean Dur (sec) 0.0 14.0 0.0 14.0 15.1 14.9 0.0 14.9  Max Dur (sec) 0.0 14.0 0.0 14.0 21.5 21.5 0.0 21.5  Total Dur (min) 0.0 0.2 0.0 0.2 2.3 2.5 0.0 2.5  % of TST 0.0 0.1 0.0 0.1 0.7 0.8 0.0 0.8  Index (#/h TST) 0.0 0.2 0.0 0.2 1.8 2.0 0.0 2.0  *Hypopneas scored based on 4% or greater desaturation.  Sleep Stage:           REM NREM TST  AHI N/A 2.0 2.0  RDI N/A 2.0 2.0   Sleep (min) TST (%) REM (min) NREM (min) CA (#) OA (#) MA (#) HYP (#) AHI (#/h) RERA (#) RDI (#/h) Desat (#)  Supine 218.5 71.64 0.0 218.5 0 0 0 6 1.6 0 1.6 11  Non-Supine 86.50 28.36 0.00 86.50 0.00 1.00 0.00 3.00 2.77 0 2.77 4.00  Right: 86.5 28.36 0.0 86.5 0 1 0 3 2.8 0 2.8 4     Snoring: Total number of snoring episodes  0  Total time with snoring    min (   % of sleep)   Oximetry Distribution:             WK REM NREM TOTAL  Average (%)   95    94 94  < 90% 0.3 0.0 0.0 0.3  < 80% 0.3 0.0 0.0 0.3  < 70% 0.3 0.0 0.0 0.3  # of Desaturations* 0 0 14 14  Desat Index (#/hour) 0.0    2.8 2.8  Desat Max (%) 0 0 7 7  Desat Max  Dur (sec) 0.0 0.0 25.0 25.0  Approx Min O2 during sleep 90  Approx min O2 during a respiratory event 90  Was Oxygen  added (Y/N) and final rate :    LPM  *Desaturations based on 4% or greater drop from baseline.   Cheyne Stokes Breathing: None Present   Heart Rate Summary:  Average Heart Rate During Sleep 60.7 bpm      Highest Heart Rate During Sleep (95th %) 66.0 bpm      Highest Heart Rate During Sleep 139 bpm      Highest Heart Rate During Recording (  TIB) 246 bpm (artifact)   Heart Rate Observations: Event Type # Events   Bradycardia 0 Lowest HR Scored: N/A  Sinus Tachycardia During Sleep 0 Highest HR Scored: N/A  Narrow Complex Tachycardia 0 Highest HR Scored: N/A  Wide Complex Tachycardia 0 Highest HR Scored: N/A  Asystole 0 Longest Pause: N/A  Atrial Fibrillation 0 Duration Longest Event: N/A  Other Arrythmias   Type:   Periodic Limb Movement Data: (Primary legs unless otherwise noted) Total # Limb Movement 236 Limb Movement Index 46.4  Total # PLMS 227 PLMS Index 44.7  Total # PLMS Arousals 85 PLMS Arousal Index 16.7  Percentage Sleep Time with PLMS 108.13min (35.7 % sleep)  Mean Duration limb movements (secs) 261.2    IPAP Level (cmH2O) EPAP Level (cmH2O) Total Duration (min) Sleep Duration (min) Sleep (%) REM (%) CA  #) OA # MA # HYP #) AHI (#/hr) RERAs # RERAs (#/hr) RDI (#/hr)  0 0 2.4 0.0 0.0 0.0 0 0 0 0 0.0 0 0.0 0.0  4 4 12.7 12.7 100.0 0.0 0 0 0 1 4.7 0 0.0 4.7  5 5  42.6 23.1 54.2 0.0 0 0 0 3 7.8 0 0.0 7.8  6 6  88.7 88.7 100.0 0.0 0 0 0 2 1.4 0 0.0 1.4  7 7  56.8 48.8 85.9 0.0 0 1 0 2 3.7 0 0.0 3.7  8 8  150.0 130.5 87.0 0.0 0 0 0 1 0.5 0 0.0 0.5

## 2024-07-18 ENCOUNTER — Encounter: Payer: Self-pay | Admitting: Internal Medicine

## 2024-07-18 ENCOUNTER — Other Ambulatory Visit: Payer: Self-pay | Admitting: Family Medicine

## 2024-07-18 ENCOUNTER — Ambulatory Visit (INDEPENDENT_AMBULATORY_CARE_PROVIDER_SITE_OTHER): Admitting: Internal Medicine

## 2024-07-18 VITALS — BP 168/67 | HR 80 | Temp 98.0°F | Resp 16 | Ht 63.0 in | Wt 176.0 lb

## 2024-07-18 DIAGNOSIS — J986 Disorders of diaphragm: Secondary | ICD-10-CM

## 2024-07-18 DIAGNOSIS — G4733 Obstructive sleep apnea (adult) (pediatric): Secondary | ICD-10-CM

## 2024-07-18 DIAGNOSIS — K219 Gastro-esophageal reflux disease without esophagitis: Secondary | ICD-10-CM

## 2024-07-18 DIAGNOSIS — J4489 Other specified chronic obstructive pulmonary disease: Secondary | ICD-10-CM | POA: Diagnosis not present

## 2024-07-18 DIAGNOSIS — J309 Allergic rhinitis, unspecified: Secondary | ICD-10-CM

## 2024-07-18 NOTE — Progress Notes (Signed)
 [facility] [address] [place], [place] [address]  Pulmonary Sleep Medicine   Office Visit Note  Patient Name: Regina Macdonald DOB: [birth year] MRN [id number]  Date of Service: 07/18/2024  Complaints/HPI: She had a CT chest done and this shows presence of gallstones but no major change in the lungs and no major pathology was noted. She also had an echo done and this looks good. Patient had a sleep study done and this shows she needs a pressure of CPAP 8-18cwp. She has an appointment to get a new machine. Patient also has a history of right hemidiaphragmn elevation for which she does use the CPAP  Office Spirometry Results:     ROS  General: (-) fever, (-) chills, (-) night sweats, (-) weakness Skin: (-) rashes, (-) itching,. Eyes: (-) visual changes, (-) redness, (-) itching. Nose and Sinuses: (-) nasal stuffiness or itchiness, (-) postnasal drip, (-) nosebleeds, (-) sinus trouble. Mouth and Throat: (-) sore throat, (-) hoarseness. Neck: (-) swollen glands, (-) enlarged thyroid, (-) neck pain. Respiratory: - cough, (-) bloody sputum, + shortness of breath, - wheezing. Cardiovascular: - ankle swelling, (-) chest pain. Lymphatic: (-) lymph node enlargement. Neurologic: (-) numbness, (-) tingling. Psychiatric: (-) anxiety, (-) depression   Current Medication: Outpatient Encounter Medications as of 07/18/2024  Medication Sig Note   acetaminophen  (TYLENOL ) 500 MG tablet Take 500 mg by mouth every 6 (six) hours as needed.    albuterol  (VENTOLIN  HFA) 108 (90 Base) MCG/ACT inhaler INHALE 2 PUFFS INTO THE LUNGS EVERY 6 HOURS AS NEEDED FOR WHEEZING OR SHORTNESS OF BREATH    aspirin  81 MG chewable tablet Chew 81 mg by mouth daily.    CALCIUM  & MAGNESIUM CARBONATES PO Take 1 tablet by mouth daily.    diphenhydramine-acetaminophen  (TYLENOL  PM) 25-500 MG TABS tablet Take 1 tablet by mouth at bedtime.    EQUATE STOOL SOFTENER 100 MG capsule Take 1 capsule by mouth twice daily     FLUoxetine  (PROZAC ) 20 MG capsule Take 1 capsule (20 mg total) by mouth daily.    fluticasone  (FLONASE ) 50 MCG/ACT nasal spray SHAKE LIQUID AND USE 2 SPRAYS IN EACH NOSTRIL DAILY    Fluticasone -Umeclidin-Vilant (TRELEGY ELLIPTA ) 100-62.5-25 MCG/ACT AEPB Inhale 1 puff into the lungs daily.    gemfibrozil  (LOPID ) 600 MG tablet Take 1 tablet (600 mg total) by mouth 2 (two) times daily before a meal.    ipratropium-albuterol  (DUONEB) 0.5-2.5 (3) MG/3ML SOLN USE 3 ML VIA NEBULIZER EVERY 6 HOURS AS NEEDED 04/26/2024: Uses 3 times per day   loratadine  (EQ LORATADINE ) 10 MG tablet Take 1 tablet (10 mg total) by mouth daily.    Multiple Vitamins-Minerals (CENTRUM SILVER PO) Take 1 capsule by mouth daily.    pantoprazole  (PROTONIX ) 40 MG tablet TAKE 1 TABLET(40 MG) BY MOUTH DAILY    polyethylene glycol (MIRALAX ) packet Take 17 g by mouth daily. 04/26/2024: Uses every other day   Vonoprazan Fumarate  (VOQUEZNA ) 10 MG TABS Take 10 mg by mouth daily. 04/26/2024: Hasn't started yet   No facility-administered encounter medications on file as of 07/18/2024.    Surgical History: Past Surgical History:  Procedure Laterality Date   ABDOMINAL HYSTERECTOMY     COLONOSCOPY  06/08/2013   Dr Ora- small mouth diverticula   HIP SURGERY Left    KNEE SURGERY Bilateral    NASAL RECONSTRUCTION     x 2    Medical History: Past Medical History:  Diagnosis Date   Anxiety    Asthma  Cancer (HCC) cervical-1973   COPD (chronic obstructive pulmonary disease) (HCC)    Depression    Diverticula, colon    Diverticulosis    GERD (gastroesophageal reflux disease)    Rectal bleeding 03/29/2014   Sleep apnea     Family History: Family History  Problem Relation Age of Onset   Cirrhosis Father    Coronary artery disease Mother    Breast cancer Neg Hx     Social History: Social History   Socioeconomic History   Marital status: Divorced    Spouse name: Not on file   Number of children: 3   Years of education:  12   Highest education level: High school graduate  Occupational History   Occupation: retired  Tobacco Use   Smoking status: Former    Current packs/day: 0.00    Types: Cigarettes    Quit date: 1971    Years since quitting: 54.7    Passive exposure: Never   Smokeless tobacco: Never   Tobacco comments:    04/26/24 Never inhaled when she was smoking. Could not tell me how long she smoked  Vaping Use   Vaping status: Never Used  Substance and Sexual Activity   Alcohol use: Not Currently   Drug use: Never   Sexual activity: Not Currently    Partners: Male    Birth control/protection: Post-menopausal  Other Topics Concern   Not on file  Social History Narrative   Pt lives with her daughter   2 children living and 1 deceased   Social Drivers of Corporate investment banker Strain: Low Risk  (04/26/2024)   Overall Financial Resource Strain (CARDIA)    Difficulty of Paying Living Expenses: Not hard at all  Food Insecurity: No Food Insecurity (04/26/2024)   Hunger Vital Sign    Worried About Running Out of Food in the Last Year: Never true    Ran Out of Food in the Last Year: Never true  Transportation Needs: No Transportation Needs (04/26/2024)   PRAPARE - Administrator, Civil Service (Medical): No    Lack of Transportation (Non-Medical): No  Physical Activity: Inactive (04/26/2024)   Exercise Vital Sign    Days of Exercise per Week: 0 days    Minutes of Exercise per Session: 0 min  Stress: No Stress Concern Present (04/26/2024)   [employer] of Occupational Health - Occupational Stress Questionnaire    Feeling of Stress : Not at all  Social Connections: Socially Isolated (04/26/2024)   Social Connection and Isolation Panel    Frequency of Communication with Friends and Family: More than three times a week    Frequency of Social Gatherings with Friends and Family: Twice a week    Attends Religious Services: Never    Database administrator or Organizations: No     Attends Banker Meetings: Never    Marital Status: Divorced  Catering manager Violence: Not At Risk (04/26/2024)   Humiliation, Afraid, Rape, and Kick questionnaire    Fear of Current or Ex-Partner: No    Emotionally Abused: No    Physically Abused: No    Sexually Abused: No    Vital Signs: Blood pressure (!) 168/67, pulse 80, temperature 98 F (36.7 C), resp. rate 16, height 5' 3 (1.6 m), weight 176 lb (79.8 kg), SpO2 96%.  Examination: General Appearance: The patient is well-developed, well-nourished, and in no distress. Skin: Gross inspection of skin unremarkable. Head: normocephalic, no gross deformities. Eyes: no gross deformities noted.  ENT: ears appear grossly normal no exudates. Neck: Supple. No thyromegaly. No LAD. Respiratory: no rhonchi noted. Cardiovascular: Normal S1 and S2 without murmur or rub. Extremities: No cyanosis. pulses are equal. Neurologic: Alert and oriented. No involuntary movements.  LABS: Recent Results (from the past 2160 hours)  CBC with Differential     Status: Abnormal   Collection Time: 05/05/24 11:51 AM  Result Value Ref Range   WBC 7.1 3.4 - 10.8 x10E3/uL   RBC 3.66 (L) 3.77 - 5.28 x10E6/uL   Hemoglobin 10.9 (L) 11.1 - 15.9 g/dL   Hematocrit 65.1 65.9 - 46.6 %   MCV 95 79 - 97 fL   MCH 29.8 26.6 - 33.0 pg   MCHC 31.3 (L) 31.5 - 35.7 g/dL   RDW 87.5 88.2 - 84.5 %   Platelets 305 150 - 450 x10E3/uL   Neutrophils 61 Not Estab. %   Lymphs 26 Not Estab. %   Monocytes 7 Not Estab. %   Eos 4 Not Estab. %   Basos 2 Not Estab. %   Neutrophils Absolute 4.3 1.4 - 7.0 x10E3/uL   Lymphocytes Absolute 1.8 0.7 - 3.1 x10E3/uL   Monocytes Absolute 0.5 0.1 - 0.9 x10E3/uL   EOS (ABSOLUTE) 0.3 0.0 - 0.4 x10E3/uL   Basophils Absolute 0.1 0.0 - 0.2 x10E3/uL   Immature Granulocytes 0 Not Estab. %   Immature Grans (Abs) 0.0 0.0 - 0.1 x10E3/uL  Comprehensive Metabolic Panel (CMET)     Status: Abnormal   Collection Time: 05/05/24 11:51 AM   Result Value Ref Range   Glucose 84 70 - 99 mg/dL   BUN 15 8 - 27 mg/dL   Creatinine, Ser 9.26 0.57 - 1.00 mg/dL   eGFR 81 >40 fO/fpw/8.26   BUN/Creatinine Ratio 21 12 - 28   Sodium 141 134 - 144 mmol/L   Potassium 4.9 3.5 - 5.2 mmol/L   Chloride 103 96 - 106 mmol/L   CO2 21 20 - 29 mmol/L   Calcium  9.4 8.7 - 10.3 mg/dL   Total Protein 7.0 6.0 - 8.5 g/dL   Albumin 4.2 3.7 - 4.7 g/dL   Globulin, Total 2.8 1.5 - 4.5 g/dL   Bilirubin Total 0.3 0.0 - 1.2 mg/dL   Alkaline Phosphatase 123 (H) 44 - 121 IU/L   AST 23 0 - 40 IU/L   ALT 11 0 - 32 IU/L  ECHOCARDIOGRAM COMPLETE     Status: None   Collection Time: 07/12/24 11:22 AM  Result Value Ref Range   Ao pk vel 1.49 m/s   AV Area VTI 3.66 cm2   AR max vel 2.79 cm2   AV Mean grad 5.0 mmHg   AV Peak grad 8.8 mmHg   S' Lateral 1.98 cm   AV Area mean vel 2.74 cm2   Area-P 1/2 2.21 cm2   MV VTI 4.14 cm2   Est EF 55 - 60%     Radiology: ECHOCARDIOGRAM COMPLETE Result Date: 07/12/2024    ECHOCARDIOGRAM REPORT   Patient Name:   MYRICAL ANDUJO Date of Exam: 07/12/2024 Medical Rec #:  [id number]        Height:       63.0 in Accession #:    [id number]       Weight:       172.8 lb Date of Birth:  May 09, 1941         BSA:          1.817 m Patient Age:    29 years  BP:           146/81 mmHg Patient Gender: F                HR:           88 bpm. Exam Location:  [facility] Procedure: 2D Echo, Cardiac Doppler and Color Doppler (Both Spectral and Color            Flow Doppler were utilized during procedure). Indications:     Murmur R01.1                  Dyspnea R06.00  History:         Patient has prior history of Echocardiogram examinations, most                  recent 08/22/2019. COPD; Risk Factors:Sleep Apnea.  Sonographer:     Christopher Furnace Referring Phys:  [id number] MACKEY POUR KOTTURI Diagnosing Phys: Marsa Dooms MD  Sonographer Comments: Image acquisition challenging due to COPD. IMPRESSIONS  1. Left ventricular ejection fraction, by estimation, is  55 to 60%. The left ventricle has normal function. The left ventricle has no regional wall motion abnormalities. Left ventricular diastolic parameters were normal.  2. Right ventricular systolic function is normal. The right ventricular size is normal.  3. The mitral valve is normal in structure. Mild mitral valve regurgitation. No evidence of mitral stenosis.  4. The aortic valve is normal in structure. Aortic valve regurgitation is not visualized. No aortic stenosis is present.  5. The inferior vena cava is normal in size with greater than 50% respiratory variability, suggesting right atrial pressure of 3 mmHg. FINDINGS  Left Ventricle: Left ventricular ejection fraction, by estimation, is 55 to 60%. The left ventricle has normal function. The left ventricle has no regional wall motion abnormalities. Strain was performed and the global longitudinal strain is indeterminate. The left ventricular internal cavity size was normal in size. There is no left ventricular hypertrophy. Left ventricular diastolic parameters were normal. Right Ventricle: The right ventricular size is normal. No increase in right ventricular wall thickness. Right ventricular systolic function is normal. Left Atrium: Left atrial size was normal in size. Right Atrium: Right atrial size was normal in size. Pericardium: There is no evidence of pericardial effusion. Mitral Valve: The mitral valve is normal in structure. Mild mitral valve regurgitation. No evidence of mitral valve stenosis. MV peak gradient, 5.9 mmHg. The mean mitral valve gradient is 3.0 mmHg. Tricuspid Valve: The tricuspid valve is normal in structure. Tricuspid valve regurgitation is mild . No evidence of tricuspid stenosis. Aortic Valve: The aortic valve is normal in structure. Aortic valve regurgitation is not visualized. No aortic stenosis is present. Aortic valve mean gradient measures 5.0 mmHg. Aortic valve peak gradient measures 8.8 mmHg. Aortic valve area, by VTI measures  3.66 cm. Pulmonic Valve: The pulmonic valve was normal in structure. Pulmonic valve regurgitation is not visualized. No evidence of pulmonic stenosis. Aorta: The aortic root is normal in size and structure. Venous: The inferior vena cava is normal in size with greater than 50% respiratory variability, suggesting right atrial pressure of 3 mmHg. IAS/Shunts: No atrial level shunt detected by color flow Doppler. Additional Comments: 3D was performed not requiring image post processing on an independent workstation and was indeterminate.  LEFT VENTRICLE PLAX 2D LVIDd:         1.88 cm   Diastology LVIDs:         1.98 cm   LV e' medial:  3.29 cm/s LV PW:         1.08 cm   LV E/e' medial:  35.3 LV IVS:        1.23 cm   LV e' lateral:   4.16 cm/s LVOT diam:     2.00 cm   LV E/e' lateral: 27.9 LV SV:         101 LV SV Index:   56 LVOT Area:     3.14 cm  RIGHT VENTRICLE RV Basal diam:  3.99 cm RV Mid diam:    3.12 cm LEFT ATRIUM           Index        RIGHT ATRIUM           Index LA diam:      4.00 cm 2.20 cm/m   RA Area:     11.90 cm LA Vol (A2C): 67.8 ml 37.31 ml/m  RA Volume:   26.50 ml  14.58 ml/m LA Vol (A4C): 41.6 ml 22.89 ml/m  AORTIC VALVE AV Area (Vmax):    2.79 cm AV Area (Vmean):   2.74 cm AV Area (VTI):     3.66 cm AV Vmax:           148.50 cm/s AV Vmean:          104.850 cm/s AV VTI:            0.277 m AV Peak Grad:      8.8 mmHg AV Mean Grad:      5.0 mmHg LVOT Vmax:         132.00 cm/s LVOT Vmean:        91.550 cm/s LVOT VTI:          0.323 m LVOT/AV VTI ratio: 1.17  AORTA Ao Root diam: 2.30 cm MITRAL VALVE                TRICUSPID VALVE MV Area (PHT): 2.21 cm     TR Peak grad:   10.1 mmHg MV Area VTI:   4.14 cm     TR Vmax:        159.00 cm/s MV Peak grad:  5.9 mmHg MV Mean grad:  3.0 mmHg     SHUNTS MV Vmax:       1.21 m/s     Systemic VTI:  0.32 m MV Vmean:      81.7 cm/s    Systemic Diam: 2.00 cm MV Decel Time: 344 msec MV E velocity: 116.00 cm/s MV A velocity: 100.00 cm/s MV E/A ratio:  1.16  Marsa Dooms MD Electronically signed by Marsa Dooms MD Signature Date/Time: 07/12/2024/4:12:54 PM    Final     No results found.  ECHOCARDIOGRAM COMPLETE Result Date: 07/12/2024    ECHOCARDIOGRAM REPORT   Patient Name:   JULANNE SCHLUETER Date of Exam: 07/12/2024 Medical Rec #:  [id number]        Height:       63.0 in Accession #:    [id number]       Weight:       172.8 lb Date of Birth:  Aug 10, 1941         BSA:          1.817 m Patient Age:    83 years         BP:           146/81 mmHg Patient Gender: F  HR:           88 bpm. Exam Location:  [facility] Procedure: 2D Echo, Cardiac Doppler and Color Doppler (Both Spectral and Color            Flow Doppler were utilized during procedure). Indications:     Murmur R01.1                  Dyspnea R06.00  History:         Patient has prior history of Echocardiogram examinations, most                  recent 08/22/2019. COPD; Risk Factors:Sleep Apnea.  Sonographer:     Christopher Furnace Referring Phys:  [id number] MACKEY POUR KOTTURI Diagnosing Phys: Marsa Dooms MD  Sonographer Comments: Image acquisition challenging due to COPD. IMPRESSIONS  1. Left ventricular ejection fraction, by estimation, is 55 to 60%. The left ventricle has normal function. The left ventricle has no regional wall motion abnormalities. Left ventricular diastolic parameters were normal.  2. Right ventricular systolic function is normal. The right ventricular size is normal.  3. The mitral valve is normal in structure. Mild mitral valve regurgitation. No evidence of mitral stenosis.  4. The aortic valve is normal in structure. Aortic valve regurgitation is not visualized. No aortic stenosis is present.  5. The inferior vena cava is normal in size with greater than 50% respiratory variability, suggesting right atrial pressure of 3 mmHg. FINDINGS  Left Ventricle: Left ventricular ejection fraction, by estimation, is 55 to 60%. The left ventricle has normal function. The left ventricle  has no regional wall motion abnormalities. Strain was performed and the global longitudinal strain is indeterminate. The left ventricular internal cavity size was normal in size. There is no left ventricular hypertrophy. Left ventricular diastolic parameters were normal. Right Ventricle: The right ventricular size is normal. No increase in right ventricular wall thickness. Right ventricular systolic function is normal. Left Atrium: Left atrial size was normal in size. Right Atrium: Right atrial size was normal in size. Pericardium: There is no evidence of pericardial effusion. Mitral Valve: The mitral valve is normal in structure. Mild mitral valve regurgitation. No evidence of mitral valve stenosis. MV peak gradient, 5.9 mmHg. The mean mitral valve gradient is 3.0 mmHg. Tricuspid Valve: The tricuspid valve is normal in structure. Tricuspid valve regurgitation is mild . No evidence of tricuspid stenosis. Aortic Valve: The aortic valve is normal in structure. Aortic valve regurgitation is not visualized. No aortic stenosis is present. Aortic valve mean gradient measures 5.0 mmHg. Aortic valve peak gradient measures 8.8 mmHg. Aortic valve area, by VTI measures 3.66 cm. Pulmonic Valve: The pulmonic valve was normal in structure. Pulmonic valve regurgitation is not visualized. No evidence of pulmonic stenosis. Aorta: The aortic root is normal in size and structure. Venous: The inferior vena cava is normal in size with greater than 50% respiratory variability, suggesting right atrial pressure of 3 mmHg. IAS/Shunts: No atrial level shunt detected by color flow Doppler. Additional Comments: 3D was performed not requiring image post processing on an independent workstation and was indeterminate.  LEFT VENTRICLE PLAX 2D LVIDd:         1.88 cm   Diastology LVIDs:         1.98 cm   LV e' medial:    3.29 cm/s LV PW:         1.08 cm   LV E/e' medial:  35.3 LV IVS:  1.23 cm   LV e' lateral:   4.16 cm/s LVOT diam:     2.00  cm   LV E/e' lateral: 27.9 LV SV:         101 LV SV Index:   56 LVOT Area:     3.14 cm  RIGHT VENTRICLE RV Basal diam:  3.99 cm RV Mid diam:    3.12 cm LEFT ATRIUM           Index        RIGHT ATRIUM           Index LA diam:      4.00 cm 2.20 cm/m   RA Area:     11.90 cm LA Vol (A2C): 67.8 ml 37.31 ml/m  RA Volume:   26.50 ml  14.58 ml/m LA Vol (A4C): 41.6 ml 22.89 ml/m  AORTIC VALVE AV Area (Vmax):    2.79 cm AV Area (Vmean):   2.74 cm AV Area (VTI):     3.66 cm AV Vmax:           148.50 cm/s AV Vmean:          104.850 cm/s AV VTI:            0.277 m AV Peak Grad:      8.8 mmHg AV Mean Grad:      5.0 mmHg LVOT Vmax:         132.00 cm/s LVOT Vmean:        91.550 cm/s LVOT VTI:          0.323 m LVOT/AV VTI ratio: 1.17  AORTA Ao Root diam: 2.30 cm MITRAL VALVE                TRICUSPID VALVE MV Area (PHT): 2.21 cm     TR Peak grad:   10.1 mmHg MV Area VTI:   4.14 cm     TR Vmax:        159.00 cm/s MV Peak grad:  5.9 mmHg MV Mean grad:  3.0 mmHg     SHUNTS MV Vmax:       1.21 m/s     Systemic VTI:  0.32 m MV Vmean:      81.7 cm/s    Systemic Diam: 2.00 cm MV Decel Time: 344 msec MV E velocity: 116.00 cm/s MV A velocity: 100.00 cm/s MV E/A ratio:  1.16 Marsa Dooms MD Electronically signed by Marsa Dooms MD Signature Date/Time: 07/12/2024/4:12:54 PM    Final    MM 3D SCREENING MAMMOGRAM BILATERAL BREAST Result Date: 06/27/2024 CLINICAL DATA:  Screening. EXAM: DIGITAL SCREENING BILATERAL MAMMOGRAM WITH TOMOSYNTHESIS AND CAD TECHNIQUE: Bilateral screening digital craniocaudal and mediolateral oblique mammograms were obtained. Bilateral screening digital breast tomosynthesis was performed. The images were evaluated with computer-aided detection. COMPARISON:  Previous exam(s). ACR Breast Density Category a: The breasts are almost entirely fatty. FINDINGS: There are no findings suspicious for malignancy. IMPRESSION: No mammographic evidence of malignancy. A result letter of this screening mammogram  will be mailed directly to the patient. RECOMMENDATION: Screening mammogram in one year. (Code:SM-B-01Y) BI-RADS CATEGORY  1: Negative. Electronically Signed   By: Alm Parkins M.D.   On: 06/27/2024 15:49   DG Bone Density Result Date: 06/27/2024 EXAM: DUAL X-RAY ABSORPTIOMETRY (DXA) FOR BONE MINERAL DENSITY 06/26/2024 1:45 pm CLINICAL DATA:  83 year old Female Postmenopausal. Postmenopausal  estrogen deficiency TECHNIQUE: An axial (e.g., hips, spine) and/or appendicular (e.g., radius) exam was performed, as appropriate, using [employer]  densitometer at [facility] : [facility]. Images are obtained for bone mineral density measurement and are not obtained for diagnostic purposes. [id number] Exclusions: L4, Left hip COMPARISON:  03/19/2015 FINDINGS: Scan quality: Good. LUMBAR SPINE (L1-L3): BMD (in g/cm2): 1.060 T-score: -0.9 Z-score: 1.0 Rate of change from previous exam: -5.4 % RIGHT FEMORAL NECK: BMD (in g/cm2): 0.730 T-score: -2.2 Z-score: 0.2 RIGHT TOTAL HIP: BMD (in g/cm2): 0.705 T-score: -2.4 Z-score: -0.1 Rate of change from previous exam: -9.6 % FRAX 10-YEAR PROBABILITY OF FRACTURE: 10-year fracture risk is performed using the [employer] FRAX calculator based on patient-reported risk factors. Major osteoporotic fracture: 23.3% Hip fracture: 6.9% Other situations known to alter the reliability of the FRAX score should be considered when making treatment decisions, including chronic glucocorticoid use and past treatments. Further guidance on treatment can be found at the [facility] Osteoporosis Foundation's website [email or web address]/. IMPRESSION: Osteopenia based on BMD. Fracture risk is increased. Increased risk is based on low BMD, FRAX calculation. RECOMMENDATIONS: 1. All patients should optimize calcium  and vitamin D  intake. 2. Consider FDA-approved medical therapies in postmenopausal women and men aged 93 years and older, based on the following: - A hip or vertebral (clinical or  morphometric) fracture - T-score less than or equal to -2.5 and secondary causes have been excluded. - Low bone mass (T-score between -1.0 and -2.5) and a 10-year probability of a hip fracture greater than or equal to 3% or a 10-year probability of a major osteoporosis-related fracture greater than or equal to 20% based on the [place] -adapted WHO algorithm. - Clinician judgment and/or patient preferences may indicate treatment for people with 10-year fracture probabilities above or below these levels 3. Patients with diagnosis of osteoporosis or at high risk for fracture should have regular bone mineral density tests. For patients eligible for Medicare, routine testing is allowed once every 2 years. The testing frequency can be increased to one year for patients who have rapidly progressing disease, those who are receiving or discontinuing medical therapy to restore bone mass, or have additional risk factors. Electronically Signed   By: Reyes Phi M.D.   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 OSA (obstructive sleep apnea) (Primary) She will be setu pon a new machine pressure 8-18cwp. She needs a new machine due to her machine age and at end of life  2. 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 Will use PAP as ordred   General Counseling: I have discussed the findings of the evaluation and examination with [place].  I have also discussed any further diagnostic evaluation thatmay be needed or ordered today. Chara verbalizes understanding of the findings of todays visit. We also reviewed her medications today and discussed drug interactions and side effects including but not limited excessive drowsiness  and altered mental states. We also discussed that there is always a risk not just to her but also people around her. she has been encouraged to call the office with any questions or concerns that should arise related to todays visit.  No orders of the defined types were placed in this encounter.    Time spent: 12  I have personally obtained a history, examined the patient, evaluated laboratory and imaging results, formulated the assessment and plan and placed orders.    Elfreda DELENA Bathe, MD [facility] Pulmonary and Critical Care Sleep medicine

## 2024-07-18 NOTE — Telephone Encounter (Unsigned)
 Copied from CRM #[id number]. Topic: Clinical - Medication Refill >> Jul 18, 2024 11:54 AM Amy B wrote: Medication:  fluticasone  (FLONASE ) 50 MCG/ACT nasal spray; pantoprazole  (PROTONIX ) 40 MG tablet  Has the patient contacted their pharmacy? Yes (Agent: If no, request that the patient contact the pharmacy for the refill. If patient does not wish to contact the pharmacy document the reason why and proceed with request.) (Agent: If yes, when and what did the pharmacy advise?)  This is the patient's preferred pharmacy:  [id number] [place], [place] - [address] AT [facility] OF [address] [place] [address] Phone: [phone] Fax: [phone]  Is this the correct pharmacy for this prescription? Yes If no, delete pharmacy and type the correct one.   Has the prescription been filled recently? No  Is the patient out of the medication? Yes  Has the patient been seen for an appointment in the last year OR does the patient have an upcoming appointment? Yes  Can we respond through MyChart? Yes  Agent: Please be advised that Rx refills may take up to 3 business days. We ask that you follow-up with your pharmacy.

## 2024-07-18 NOTE — Patient Instructions (Signed)

## 2024-07-19 ENCOUNTER — Other Ambulatory Visit: Payer: Self-pay

## 2024-07-19 ENCOUNTER — Other Ambulatory Visit: Payer: Self-pay | Admitting: Family Medicine

## 2024-07-19 DIAGNOSIS — K219 Gastro-esophageal reflux disease without esophagitis: Secondary | ICD-10-CM

## 2024-07-19 MED ORDER — FLUTICASONE PROPIONATE 50 MCG/ACT NA SUSP: NASAL | 0 refills | Status: DC

## 2024-07-19 MED ORDER — PANTOPRAZOLE SODIUM 40 MG PO TBEC: 40.0000 mg | DELAYED_RELEASE_TABLET | Freq: Every day | ORAL | 0 refills | Status: DC

## 2024-07-19 NOTE — Telephone Encounter (Signed)
 Pt called to check on status of this refill request. Please advise.

## 2024-07-20 NOTE — Telephone Encounter (Signed)
 Requested Prescriptions  Refused Prescriptions Disp Refills   pantoprazole  (PROTONIX ) 40 MG tablet [Pharmacy Med Name: PANTOPRAZOLE  40MG  TABLETS] 90 tablet     Sig: TAKE 1 TABLET(40 MG) BY MOUTH DAILY     Gastroenterology: Proton Pump Inhibitors Passed - 07/20/2024 12:13 PM      Passed - Valid encounter within last 12 months    Recent Outpatient Visits           2 months ago OSA on CPAP   [place] [facility] at [facility], Vinay K, MD       Future Appointments             In 3 months Kotturi, Vinay K, MD [facility] Primary Care & Sports Medicine at [facility], [phone] [place]

## 2024-08-19 ENCOUNTER — Other Ambulatory Visit: Payer: Self-pay | Admitting: Family Medicine

## 2024-08-19 DIAGNOSIS — K219 Gastro-esophageal reflux disease without esophagitis: Secondary | ICD-10-CM

## 2024-08-21 ENCOUNTER — Ambulatory Visit: Admitting: Internal Medicine

## 2024-08-21 NOTE — Telephone Encounter (Signed)
 Requested Prescriptions  Pending Prescriptions Disp Refills   pantoprazole  (PROTONIX ) 40 MG tablet [Pharmacy Med Name: PANTOPRAZOLE  40MG  TABLETS] 90 tablet 0    Sig: TAKE 1 TABLET(40 MG) BY MOUTH DAILY     Gastroenterology: Proton Pump Inhibitors Passed - 08/21/2024  1:41 PM      Passed - Valid encounter within last 12 months    Recent Outpatient Visits           3 months ago OSA on CPAP   [place] [facility] at [facility], Vinay K, MD       Future Appointments             In 2 months Kotturi, Vinay K, MD [facility] Primary Care & Sports Medicine at [facility], [phone] [place]

## 2024-08-29 ENCOUNTER — Ambulatory Visit: Admitting: Internal Medicine

## 2024-08-29 ENCOUNTER — Encounter: Payer: Self-pay | Admitting: Internal Medicine

## 2024-08-29 VITALS — BP 90/70 | HR 84 | Temp 98.0°F | Resp 16 | Ht 63.0 in | Wt 174.0 lb

## 2024-08-29 DIAGNOSIS — J4489 Other specified chronic obstructive pulmonary disease: Secondary | ICD-10-CM | POA: Diagnosis not present

## 2024-08-29 DIAGNOSIS — R053 Chronic cough: Secondary | ICD-10-CM | POA: Diagnosis not present

## 2024-08-29 DIAGNOSIS — G4733 Obstructive sleep apnea (adult) (pediatric): Secondary | ICD-10-CM

## 2024-08-29 DIAGNOSIS — J986 Disorders of diaphragm: Secondary | ICD-10-CM | POA: Diagnosis not present

## 2024-08-29 NOTE — Progress Notes (Signed)
 [facility] [address] [place], [place] [address]  Pulmonary Sleep Medicine   Office Visit Note  Patient Name: Regina Macdonald DOB: [birth year] MRN [id number]  Date of Service: 08/29/2024  Complaints/HPI: She is here for OSa follow up. She has been doing well. She received the machine on 9/25 and therefore has 100% compliance contrary to the download. Patient states no issue with the mask and chin straps. She feels somewhat better in the day. Echo that was done was reviewed and looks good. She is using her nebs twice daily  Office Spirometry Results:     ROS  General: (-) fever, (-) chills, (-) night sweats, (-) weakness Skin: (-) rashes, (-) itching,. Eyes: (-) visual changes, (-) redness, (-) itching. Nose and Sinuses: (-) nasal stuffiness or itchiness, (-) postnasal drip, (-) nosebleeds, (-) sinus trouble. Mouth and Throat: (-) sore throat, (-) hoarseness. Neck: (-) swollen glands, (-) enlarged thyroid, (-) neck pain. Respiratory: - cough, (-) bloody sputum, + shortness of breath, - wheezing. Cardiovascular: - ankle swelling, (-) chest pain. Lymphatic: (-) lymph node enlargement. Neurologic: (-) numbness, (-) tingling. Psychiatric: (-) anxiety, (-) depression   Current Medication: Outpatient Encounter Medications as of 08/29/2024  Medication Sig Note   acetaminophen  (TYLENOL ) 500 MG tablet Take 500 mg by mouth every 6 (six) hours as needed.    albuterol  (VENTOLIN  HFA) 108 (90 Base) MCG/ACT inhaler INHALE 2 PUFFS INTO THE LUNGS EVERY 6 HOURS AS NEEDED FOR WHEEZING OR SHORTNESS OF BREATH    aspirin  81 MG chewable tablet Chew 81 mg by mouth daily.    CALCIUM  & MAGNESIUM CARBONATES PO Take 1 tablet by mouth daily.    diphenhydramine-acetaminophen  (TYLENOL  PM) 25-500 MG TABS tablet Take 1 tablet by mouth at bedtime.    EQUATE STOOL SOFTENER 100 MG capsule Take 1 capsule by mouth twice daily    FLUoxetine  (PROZAC ) 20 MG capsule Take 1 capsule (20 mg total) by  mouth daily.    fluticasone  (FLONASE ) 50 MCG/ACT nasal spray SHAKE LIQUID AND USE 2 SPRAYS IN EACH NOSTRIL DAILY    gemfibrozil  (LOPID ) 600 MG tablet Take 1 tablet (600 mg total) by mouth 2 (two) times daily before a meal.    ipratropium-albuterol  (DUONEB) 0.5-2.5 (3) MG/3ML SOLN USE 3 ML VIA NEBULIZER EVERY 6 HOURS AS NEEDED 04/26/2024: Uses 3 times per day   loratadine  (EQ LORATADINE ) 10 MG tablet Take 1 tablet (10 mg total) by mouth daily.    Multiple Vitamins-Minerals (CENTRUM SILVER PO) Take 1 capsule by mouth daily.    pantoprazole  (PROTONIX ) 40 MG tablet TAKE 1 TABLET(40 MG) BY MOUTH DAILY    polyethylene glycol (MIRALAX ) packet Take 17 g by mouth daily. 04/26/2024: Uses every other day   [DISCONTINUED] Fluticasone -Umeclidin-Vilant (TRELEGY ELLIPTA ) 100-62.5-25 MCG/ACT AEPB Inhale 1 puff into the lungs daily.    [DISCONTINUED] Vonoprazan Fumarate  (VOQUEZNA ) 10 MG TABS Take 10 mg by mouth daily. 04/26/2024: Hasn't started yet   No facility-administered encounter medications on file as of 08/29/2024.    Surgical History: Past Surgical History:  Procedure Laterality Date   ABDOMINAL HYSTERECTOMY     COLONOSCOPY  06/08/2013   Dr Ora- small mouth diverticula   HIP SURGERY Left    KNEE SURGERY Bilateral    NASAL RECONSTRUCTION     x 2    Medical History: Past Medical History:  Diagnosis Date   Anxiety    Asthma    Cancer (HCC) cervical-1973   COPD (chronic obstructive pulmonary disease) (HCC)  Depression    Diverticula, colon    Diverticulosis    GERD (gastroesophageal reflux disease)    Rectal bleeding 03/29/2014   Sleep apnea     Family History: Family History  Problem Relation Age of Onset   Cirrhosis Father    Coronary artery disease Mother    Breast cancer Neg Hx     Social History: Social History   Socioeconomic History   Marital status: Divorced    Spouse name: Not on file   Number of children: 3   Years of education: 12   Highest education level: High  school graduate  Occupational History   Occupation: retired  Tobacco Use   Smoking status: Former    Current packs/day: 0.00    Types: Cigarettes    Quit date: 1971    Years since quitting: 54.8    Passive exposure: Never   Smokeless tobacco: Never   Tobacco comments:    04/26/24 Never inhaled when she was smoking. Could not tell me how long she smoked  Vaping Use   Vaping status: Never Used  Substance and Sexual Activity   Alcohol use: Not Currently   Drug use: Never   Sexual activity: Not Currently    Partners: Male    Birth control/protection: Post-menopausal  Other Topics Concern   Not on file  Social History Narrative   Pt lives with her daughter   2 children living and 1 deceased   Social Drivers of Corporate investment banker Strain: Low Risk  (04/26/2024)   Overall Financial Resource Strain (CARDIA)    Difficulty of Paying Living Expenses: Not hard at all  Food Insecurity: No Food Insecurity (04/26/2024)   Hunger Vital Sign    Worried About Running Out of Food in the Last Year: Never true    Ran Out of Food in the Last Year: Never true  Transportation Needs: No Transportation Needs (04/26/2024)   PRAPARE - Administrator, Civil Service (Medical): No    Lack of Transportation (Non-Medical): No  Physical Activity: Inactive (04/26/2024)   Exercise Vital Sign    Days of Exercise per Week: 0 days    Minutes of Exercise per Session: 0 min  Stress: No Stress Concern Present (04/26/2024)   [employer] of Occupational Health - Occupational Stress Questionnaire    Feeling of Stress : Not at all  Social Connections: Socially Isolated (04/26/2024)   Social Connection and Isolation Panel    Frequency of Communication with Friends and Family: More than three times a week    Frequency of Social Gatherings with Friends and Family: Twice a week    Attends Religious Services: Never    Database administrator or Organizations: No    Attends Banker  Meetings: Never    Marital Status: Divorced  Catering manager Violence: Not At Risk (04/26/2024)   Humiliation, Afraid, Rape, and Kick questionnaire    Fear of Current or Ex-Partner: No    Emotionally Abused: No    Physically Abused: No    Sexually Abused: No    Vital Signs: Blood pressure 90/70, pulse 84, temperature 98 F (36.7 C), resp. rate 16, height 5' 3 (1.6 m), weight 174 lb (78.9 kg), SpO2 97%.  Examination: General Appearance: The patient is well-developed, well-nourished, and in no distress. Skin: Gross inspection of skin unremarkable. Head: normocephalic, no gross deformities. Eyes: no gross deformities noted. ENT: ears appear grossly normal no exudates. Neck: Supple. No thyromegaly. No LAD. Respiratory: no  rhonchi noted. Cardiovascular: Normal S1 and S2 without murmur or rub. Extremities: No cyanosis. pulses are equal. Neurologic: Alert and oriented. No involuntary movements.  LABS: Recent Results (from the past 2160 hours)  ECHOCARDIOGRAM COMPLETE     Status: None   Collection Time: 07/12/24 11:22 AM  Result Value Ref Range   Ao pk vel 1.49 m/s   AV Area VTI 3.66 cm2   AR max vel 2.79 cm2   AV Mean grad 5.0 mmHg   AV Peak grad 8.8 mmHg   S' Lateral 1.98 cm   AV Area mean vel 2.74 cm2   Area-P 1/2 2.21 cm2   MV VTI 4.14 cm2   Est EF 55 - 60%     Radiology: ECHOCARDIOGRAM COMPLETE Result Date: 07/12/2024    ECHOCARDIOGRAM REPORT   Patient Name:   KILYNN FITZSIMMONS Date of Exam: 07/12/2024 Medical Rec #:  [id number]        Height:       63.0 in Accession #:    [id number]       Weight:       172.8 lb Date of Birth:  1941/03/31         BSA:          1.817 m Patient Age:    83 years         BP:           146/81 mmHg Patient Gender: F                HR:           88 bpm. Exam Location:  [facility] Procedure: 2D Echo, Cardiac Doppler and Color Doppler (Both Spectral and Color            Flow Doppler were utilized during procedure). Indications:     Murmur R01.1                   Dyspnea R06.00  History:         Patient has prior history of Echocardiogram examinations, most                  recent 08/22/2019. COPD; Risk Factors:Sleep Apnea.  Sonographer:     Christopher Furnace Referring Phys:  [id number] MACKEY POUR KOTTURI Diagnosing Phys: Marsa Dooms MD  Sonographer Comments: Image acquisition challenging due to COPD. IMPRESSIONS  1. Left ventricular ejection fraction, by estimation, is 55 to 60%. The left ventricle has normal function. The left ventricle has no regional wall motion abnormalities. Left ventricular diastolic parameters were normal.  2. Right ventricular systolic function is normal. The right ventricular size is normal.  3. The mitral valve is normal in structure. Mild mitral valve regurgitation. No evidence of mitral stenosis.  4. The aortic valve is normal in structure. Aortic valve regurgitation is not visualized. No aortic stenosis is present.  5. The inferior vena cava is normal in size with greater than 50% respiratory variability, suggesting right atrial pressure of 3 mmHg. FINDINGS  Left Ventricle: Left ventricular ejection fraction, by estimation, is 55 to 60%. The left ventricle has normal function. The left ventricle has no regional wall motion abnormalities. Strain was performed and the global longitudinal strain is indeterminate. The left ventricular internal cavity size was normal in size. There is no left ventricular hypertrophy. Left ventricular diastolic parameters were normal. Right Ventricle: The right ventricular size is normal. No increase in right ventricular wall thickness. Right ventricular systolic function is normal. Left Atrium: Left  atrial size was normal in size. Right Atrium: Right atrial size was normal in size. Pericardium: There is no evidence of pericardial effusion. Mitral Valve: The mitral valve is normal in structure. Mild mitral valve regurgitation. No evidence of mitral valve stenosis. MV peak gradient, 5.9 mmHg. The mean mitral valve  gradient is 3.0 mmHg. Tricuspid Valve: The tricuspid valve is normal in structure. Tricuspid valve regurgitation is mild . No evidence of tricuspid stenosis. Aortic Valve: The aortic valve is normal in structure. Aortic valve regurgitation is not visualized. No aortic stenosis is present. Aortic valve mean gradient measures 5.0 mmHg. Aortic valve peak gradient measures 8.8 mmHg. Aortic valve area, by VTI measures 3.66 cm. Pulmonic Valve: The pulmonic valve was normal in structure. Pulmonic valve regurgitation is not visualized. No evidence of pulmonic stenosis. Aorta: The aortic root is normal in size and structure. Venous: The inferior vena cava is normal in size with greater than 50% respiratory variability, suggesting right atrial pressure of 3 mmHg. IAS/Shunts: No atrial level shunt detected by color flow Doppler. Additional Comments: 3D was performed not requiring image post processing on an independent workstation and was indeterminate.  LEFT VENTRICLE PLAX 2D LVIDd:         1.88 cm   Diastology LVIDs:         1.98 cm   LV e' medial:    3.29 cm/s LV PW:         1.08 cm   LV E/e' medial:  35.3 LV IVS:        1.23 cm   LV e' lateral:   4.16 cm/s LVOT diam:     2.00 cm   LV E/e' lateral: 27.9 LV SV:         101 LV SV Index:   56 LVOT Area:     3.14 cm  RIGHT VENTRICLE RV Basal diam:  3.99 cm RV Mid diam:    3.12 cm LEFT ATRIUM           Index        RIGHT ATRIUM           Index LA diam:      4.00 cm 2.20 cm/m   RA Area:     11.90 cm LA Vol (A2C): 67.8 ml 37.31 ml/m  RA Volume:   26.50 ml  14.58 ml/m LA Vol (A4C): 41.6 ml 22.89 ml/m  AORTIC VALVE AV Area (Vmax):    2.79 cm AV Area (Vmean):   2.74 cm AV Area (VTI):     3.66 cm AV Vmax:           148.50 cm/s AV Vmean:          104.850 cm/s AV VTI:            0.277 m AV Peak Grad:      8.8 mmHg AV Mean Grad:      5.0 mmHg LVOT Vmax:         132.00 cm/s LVOT Vmean:        91.550 cm/s LVOT VTI:          0.323 m LVOT/AV VTI ratio: 1.17  AORTA Ao Root diam:  2.30 cm MITRAL VALVE                TRICUSPID VALVE MV Area (PHT): 2.21 cm     TR Peak grad:   10.1 mmHg MV Area VTI:   4.14 cm     TR Vmax:  159.00 cm/s MV Peak grad:  5.9 mmHg MV Mean grad:  3.0 mmHg     SHUNTS MV Vmax:       1.21 m/s     Systemic VTI:  0.32 m MV Vmean:      81.7 cm/s    Systemic Diam: 2.00 cm MV Decel Time: 344 msec MV E velocity: 116.00 cm/s MV A velocity: 100.00 cm/s MV E/A ratio:  1.16 Marsa Dooms MD Electronically signed by Marsa Dooms MD Signature Date/Time: 07/12/2024/4:12:54 PM    Final     No results found.  No results found.  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 OSA (obstructive sleep apnea) (Primary) On CPAP and her control is good with an AHI of 0.8 per hour. Compliance is 100% based on when she got the machine  2. Obstructive chronic bronchitis without exacerbation (HCC) On nebs which will be continued  3. Diaphragm dysfunction Continue PAP therapy. Good control  4. Chronic cough Improved occasional cough now noted  5. Morbid obesity (HCC) Diet and exercise once again discussed    General Counseling: I have discussed the findings of the evaluation and examination with [place].  I have also discussed any further diagnostic evaluation thatmay be needed or ordered today. Alicen verbalizes understanding of the findings of todays visit. We also reviewed her  medications today and discussed drug interactions and side effects including but not limited excessive drowsiness and altered mental states. We also discussed that there is always a risk not just to her but also people around her. she has been encouraged to call the office with any questions or concerns that should arise related to todays visit.  No orders of the defined types were placed in this encounter.    Time spent: 33  I have personally obtained a history, examined the patient, evaluated laboratory and imaging results, formulated the assessment and plan and placed orders.    Elfreda DELENA Bathe, MD [facility] Pulmonary and Critical Care Sleep medicine

## 2024-09-14 ENCOUNTER — Other Ambulatory Visit: Payer: Self-pay | Admitting: Family Medicine

## 2024-09-14 DIAGNOSIS — F320 Major depressive disorder, single episode, mild: Secondary | ICD-10-CM

## 2024-09-14 DIAGNOSIS — F419 Anxiety disorder, unspecified: Secondary | ICD-10-CM

## 2024-09-15 NOTE — Telephone Encounter (Signed)
 Requested Prescriptions  Pending Prescriptions Disp Refills   FLUoxetine  (PROZAC ) 20 MG capsule [Pharmacy Med Name: FLUOXETINE  20MG  CAPSULES] 90 capsule 0    Sig: TAKE 1 CAPSULE(20 MG) BY MOUTH DAILY     Psychiatry:  Antidepressants - SSRI Passed - 09/15/2024  3:52 PM      Passed - Completed PHQ-2 or PHQ-9 in the last 360 days      Passed - Valid encounter within last 6 months    Recent Outpatient Visits           4 months ago OSA on CPAP   [place] [facility] at [facility], Vinay K, MD       Future Appointments             In 1 month Kotturi, Vinay K, MD [facility] Primary Care & Sports Medicine at [facility], [phone] [place]

## 2024-10-03 ENCOUNTER — Other Ambulatory Visit: Payer: Self-pay | Admitting: Family Medicine

## 2024-10-03 DIAGNOSIS — E785 Hyperlipidemia, unspecified: Secondary | ICD-10-CM

## 2024-10-04 ENCOUNTER — Telehealth: Payer: Self-pay | Admitting: Internal Medicine

## 2024-10-04 NOTE — Telephone Encounter (Signed)
 MB full, sent mychart message to confirm 10/11/24 appointment-Toni

## 2024-10-05 NOTE — Telephone Encounter (Signed)
 Requested medication (s) are due for refill today: Yes  Requested medication (s) are on the active medication list: Yes  Last refill:  07/06/24  Future visit scheduled: Yes  Notes to clinic:  Unable to refill per protocol due to failed labs, no updated results.      Requested Prescriptions  Pending Prescriptions Disp Refills   gemfibrozil  (LOPID ) 600 MG tablet [Pharmacy Med Name: GEMFIBROZIL  600MG  TABLETS] 180 tablet 0    Sig: TAKE 1 TABLET(600 MG) BY MOUTH TWICE DAILY BEFORE A MEAL     Cardiovascular:  Antilipid - Fibric Acid Derivatives Failed - 10/05/2024  1:57 PM      Failed - HGB in normal range and within 360 days    Hemoglobin  Date Value Ref Range Status  05/05/2024 10.9 (L) 11.1 - 15.9 g/dL Final         Failed - Lipid Panel in normal range within the last 12 months    Cholesterol, Total  Date Value Ref Range Status  04/01/2023 143 100 - 199 mg/dL Final   LDL Chol Calc (NIH)  Date Value Ref Range Status  04/01/2023 81 0 - 99 mg/dL Final   HDL  Date Value Ref Range Status  04/01/2023 45 >39 mg/dL Final   Triglycerides  Date Value Ref Range Status  04/01/2023 90 0 - 149 mg/dL Final         Passed - ALT in normal range and within 360 days    ALT  Date Value Ref Range Status  05/05/2024 11 0 - 32 IU/L Final         Passed - AST in normal range and within 360 days    AST  Date Value Ref Range Status  05/05/2024 23 0 - 40 IU/L Final         Passed - Cr in normal range and within 360 days    Creatinine  Date Value Ref Range Status  12/31/2013 0.75 0.60 - 1.30 mg/dL Final   Creatinine, Ser  Date Value Ref Range Status  05/05/2024 0.73 0.57 - 1.00 mg/dL Final         Passed - HCT in normal range and within 360 days    Hematocrit  Date Value Ref Range Status  05/05/2024 34.8 34.0 - 46.6 % Final         Passed - PLT in normal range and within 360 days    Platelets  Date Value Ref Range Status  05/05/2024 305 150 - 450 x10E3/uL Final          Passed - WBC in normal range and within 360 days    WBC  Date Value Ref Range Status  05/05/2024 7.1 3.4 - 10.8 x10E3/uL Final  02/10/2023 8.0 4.0 - 10.5 K/uL Final         Passed - eGFR is 30 or above and within 360 days    EGFR (African American)  Date Value Ref Range Status  12/31/2013 >60  Final   GFR calc Af Amer  Date Value Ref Range Status  12/20/2019 98 >59 mL/min/1.73 Final   EGFR (Non-African Amer.)  Date Value Ref Range Status  12/31/2013 >60  Final    Comment:    eGFR values <13mL/min/1.73 m2 may be an indication of chronic kidney disease (CKD). Calculated eGFR is useful in patients with stable renal function. The eGFR calculation will not be reliable in acutely ill patients when serum creatinine is changing rapidly. It is not useful in  patients on  dialysis. The eGFR calculation may not be applicable to patients at the low and high extremes of body sizes, pregnant women, and vegetarians.    GFR, Estimated  Date Value Ref Range Status  07/21/2022 >60 >60 mL/min Final    Comment:    (NOTE) Calculated using the CKD-EPI Creatinine Equation (2021)    eGFR  Date Value Ref Range Status  05/05/2024 81 >59 mL/min/1.73 Final         Passed - Valid encounter within last 12 months    Recent Outpatient Visits           5 months ago OSA on CPAP   [place] [facility] at [facility], Vinay K, MD       Future Appointments             In 1 month Kotturi, Vinay K, MD [facility] Primary Care & Sports Medicine at [facility], [phone] [place]

## 2024-10-09 ENCOUNTER — Ambulatory Visit: Payer: Medicare Other | Admitting: Internal Medicine

## 2024-10-11 ENCOUNTER — Ambulatory Visit: Payer: Medicare Other | Admitting: Internal Medicine

## 2024-10-11 DIAGNOSIS — J4489 Other specified chronic obstructive pulmonary disease: Secondary | ICD-10-CM

## 2024-10-13 NOTE — Procedures (Signed)
 [facility] [address] [place], [address]    Complete Pulmonary Function Testing Interpretation:  FINDINGS:  The forced vital capacity is moderately decreased.  FEV1 is 1.19 L which is 69% of predicted and is mildly decreased.  FEV1 FVC ratio was within normal limits.  Postbronchodilator no significant change in FEV1 was noted.  Total lung capacity is mildly decreased.  Residual volume was decreased.  FRC was decreased.  DLCO was mildly decreased and normal when corrected for alveolar volume.  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  Regina DELENA Bathe, Regina Macdonald [facility] Pulmonary Critical Care Medicine Sleep Medicine

## 2024-10-18 ENCOUNTER — Other Ambulatory Visit: Payer: Self-pay | Admitting: Family Medicine

## 2024-10-18 DIAGNOSIS — J309 Allergic rhinitis, unspecified: Secondary | ICD-10-CM

## 2024-10-23 ENCOUNTER — Ambulatory Visit: Payer: Medicare Other | Admitting: Internal Medicine

## 2024-11-06 ENCOUNTER — Ambulatory Visit: Admitting: Family Medicine

## 2024-11-22 ENCOUNTER — Encounter: Payer: Self-pay | Admitting: Family Medicine

## 2024-11-22 ENCOUNTER — Ambulatory Visit: Admitting: Family Medicine

## 2024-11-22 VITALS — BP 116/58 | HR 83 | Ht 63.0 in | Wt 173.0 lb

## 2024-11-22 DIAGNOSIS — F419 Anxiety disorder, unspecified: Secondary | ICD-10-CM

## 2024-11-22 DIAGNOSIS — J42 Unspecified chronic bronchitis: Secondary | ICD-10-CM

## 2024-11-22 DIAGNOSIS — G4733 Obstructive sleep apnea (adult) (pediatric): Secondary | ICD-10-CM | POA: Diagnosis not present

## 2024-11-22 DIAGNOSIS — F320 Major depressive disorder, single episode, mild: Secondary | ICD-10-CM

## 2024-11-22 DIAGNOSIS — Z130 Encounter for screening for diseases of the blood and blood-forming organs and certain disorders involving the immune mechanism: Secondary | ICD-10-CM | POA: Diagnosis not present

## 2024-11-22 DIAGNOSIS — E785 Hyperlipidemia, unspecified: Secondary | ICD-10-CM | POA: Diagnosis not present

## 2024-11-22 DIAGNOSIS — Z760 Encounter for issue of repeat prescription: Secondary | ICD-10-CM | POA: Diagnosis not present

## 2024-11-22 DIAGNOSIS — W5501XA Bitten by cat, initial encounter: Secondary | ICD-10-CM

## 2024-11-22 DIAGNOSIS — Z23 Encounter for immunization: Secondary | ICD-10-CM

## 2024-11-22 DIAGNOSIS — Z6830 Body mass index (BMI) 30.0-30.9, adult: Secondary | ICD-10-CM | POA: Diagnosis not present

## 2024-11-22 MED ORDER — LORATADINE 10 MG PO TABS: 10.0000 mg | ORAL_TABLET | Freq: Every day | ORAL | 1 refills | Status: AC

## 2024-11-22 MED ORDER — FLUOXETINE HCL 20 MG PO CAPS: 20.0000 mg | ORAL_CAPSULE | Freq: Every day | ORAL | 1 refills | Status: AC

## 2024-11-22 MED ORDER — GEMFIBROZIL 600 MG PO TABS: 600.0000 mg | ORAL_TABLET | Freq: Two times a day (BID) | ORAL | 0 refills | Status: AC

## 2024-11-22 NOTE — Progress Notes (Signed)
 "  Established Patient Office Visit  Patient ID: Regina Macdonald, female    DOB: Jul 22, 1941  Age: 84 y.o. MRN: [id number] PCP: Jeorgia Helming K, MD  No chief complaint on file.   Subjective:     HPI  Discussed the use of AI scribe software for clinical note transcription with the patient, who gave verbal consent to proceed.  History of Present Illness Regina Macdonald is an 84 year old female who presents for a routine follow-up visit.  She uses a nebulizer twice daily and Trelegy daily for her breathing issues, which have been stable. She experiences occasional coughing spells, which she attributes to the CPAP machine. She has a history of smoking but quit in her thirties.  She uses a CPAP machine, received in September, and reports getting 7 to 10 hours of sleep per night. She refills the humidifier once during the night if needed.  Her current medications include Tylenol  as needed, calcium  tablets, baby aspirin , Prozac  20 mg, and a multivitamin. She denies using Flonase  or any nasal spray currently. She also takes a stool softener and Tylenol  PM. She is running out of Prozac .  She lives alone but her son checks on her regularly and has installed security cameras throughout her house for monitoring. Her son is 42 years old and works as a production designer, theatre/television/film at [employer]. She also has a daughter living in [place] and mentions having lost a child at age 34.  No heartburn, blood in urine, or blood in stool. She is not taking any iron tablets.     Review of Systems  All other systems reviewed and are negative.     Objective:     BP (!) 116/58   Pulse 83   Ht 5' 3 (1.6 m)   Wt 173 lb (78.5 kg)   SpO2 97%   BMI 30.65 kg/m  BP Readings from Last 3 Encounters:  11/22/24 (!) 116/58  08/29/24 90/70  07/18/24 (!) 168/67   Wt Readings from Last 3 Encounters:  11/22/24 173 lb (78.5 kg)  08/29/24 174 lb (78.9 kg)  07/18/24 176 lb (79.8 kg)      Physical Exam Vitals and nursing  note reviewed.  Constitutional:      Appearance: Normal appearance.  HENT:     Head: Normocephalic.     Right Ear: External ear normal.     Left Ear: External ear normal.  Eyes:     Conjunctiva/sclera: Conjunctivae normal.  Cardiovascular:     Rate and Rhythm: Normal rate.  Pulmonary:     Effort: Pulmonary effort is normal. No respiratory distress.  Abdominal:     Palpations: Abdomen is soft.  Musculoskeletal:        General: Normal range of motion.       Arms:     Comments: Dark color bite mark noted on left dorsum. No signs of infection.  Skin:    General: Skin is warm.  Neurological:     Mental Status: She is alert and oriented to person, place, and time.  Psychiatric:        Mood and Affect: Mood normal.     Physical Exam VITALS: BP- 116/58 MEASUREMENTS: Weight- 173.   No results found for any visits on 11/22/24.     The ASCVD Risk score (Arnett DK, et al., 2019) failed to calculate for the following reasons:   The 2019 ASCVD risk score is only valid for ages 59 to 58   * - Cholesterol units were  assumed    Assessment & Plan:   Problem List Items Addressed This Visit       Respiratory   OSA on CPAP - Primary   Chronic bronchitis (HCC)     Other   Hyperlipidemia   Relevant Medications   gemfibrozil  (LOPID ) 600 MG tablet   Other Relevant Orders   Lipid panel   Other Visit Diagnoses       Anxiety       Relevant Medications   FLUoxetine  (PROZAC ) 20 MG capsule     Current mild episode of major depressive disorder, unspecified whether recurrent       Relevant Medications   FLUoxetine  (PROZAC ) 20 MG capsule     BMI 30.0-30.9,adult       Relevant Orders   Comprehensive Metabolic Panel (CMET)   TSH     Screening for iron deficiency anemia       Relevant Orders   CBC with Differential     Medication refill       Relevant Medications   FLUoxetine  (PROZAC ) 20 MG capsule   gemfibrozil  (LOPID ) 600 MG tablet   loratadine  (EQ LORATADINE ) 10 MG tablet      Cat bite, initial encounter           Assessment and Plan Assessment & Plan Chronic bronchitis Cough present, uses nebulizer treatment. - Continue nebulizer treatment as prescribed by pulmonologist.  Obstructive sleep apnea Managed with CPAP, improved sleep duration with occasional awakenings due to humidifier issues. - Continue CPAP therapy.  Major depressive disorder, mild Managed with Prozac  20 mg, effective. - Continue Prozac  20 mg daily.  Hyperlipidemia Managed with Lopid , no new concerns. - Continue Lopid  as prescribed.  Obesity Weight stable at 173 pounds.  General health maintenance Inquired about tetanus vaccination status. - Checked tetanus vaccination status. - Ordered blood work to assess overall health.  Cat bite: Healing. No need for antibiotics. Tdap given.  No follow-ups on file.    Vinary K Annmarie Plemmons, MD [facility] at [facility]   "

## 2024-11-22 NOTE — Progress Notes (Signed)
" ° °  Established Patient Office Visit  Patient ID: Regina Macdonald, female    DOB: [birth year]  Age: 84 y.o. MRN: [id number] PCP: Constantina Laseter K, MD  No chief complaint on file.   Subjective:     HPI  Discussed the use of AI scribe software for clinical note transcription with the patient, who gave verbal consent to proceed.  History of Present Illness      ROS    Objective:     BP (!) 116/58   Pulse 83   Ht 5' 3 (1.6 m)   Wt 173 lb (78.5 kg)   SpO2 97%   BMI 30.65 kg/m    Physical Exam   No results found for any visits on 11/22/24.    The ASCVD Risk score (Arnett DK, et al., 2019) failed to calculate for the following reasons:   The 2019 ASCVD risk score is only valid for ages 39 to 17   * - Cholesterol units were assumed    Assessment & Plan:   Problem List Items Addressed This Visit   None   Assessment and Plan Assessment & Plan     No follow-ups on file.    Vinary K Srija Southard, MD [facility] at [facility]   "

## 2024-11-23 ENCOUNTER — Ambulatory Visit: Payer: Self-pay | Admitting: Family Medicine

## 2024-11-23 LAB — CBC WITH DIFFERENTIAL/PLATELET
Basophils Absolute: 0.1 x10E3/uL (ref 0.0–0.2)
Basos: 2 %
EOS (ABSOLUTE): 0.2 x10E3/uL (ref 0.0–0.4)
Eos: 3 %
Hematocrit: 34.2 % (ref 34.0–46.6)
Hemoglobin: 11 g/dL — ABNORMAL LOW (ref 11.1–15.9)
Immature Grans (Abs): 0 x10E3/uL (ref 0.0–0.1)
Immature Granulocytes: 0 %
Lymphocytes Absolute: 1.5 x10E3/uL (ref 0.7–3.1)
Lymphs: 21 %
MCH: 30.6 pg (ref 26.6–33.0)
MCHC: 32.2 g/dL (ref 31.5–35.7)
MCV: 95 fL (ref 79–97)
Monocytes Absolute: 0.5 x10E3/uL (ref 0.1–0.9)
Monocytes: 7 %
Neutrophils Absolute: 4.8 x10E3/uL (ref 1.4–7.0)
Neutrophils: 67 %
Platelets: 311 x10E3/uL (ref 150–450)
RBC: 3.59 x10E6/uL — ABNORMAL LOW (ref 3.77–5.28)
RDW: 13.1 % (ref 11.7–15.4)
WBC: 7.2 x10E3/uL (ref 3.4–10.8)

## 2024-11-23 LAB — LIPID PANEL
Chol/HDL Ratio: 3.3 ratio (ref 0.0–4.4)
Cholesterol, Total: 142 mg/dL (ref 100–199)
HDL: 43 mg/dL
LDL Chol Calc (NIH): 85 mg/dL (ref 0–99)
Triglycerides: 71 mg/dL (ref 0–149)
VLDL Cholesterol Cal: 14 mg/dL (ref 5–40)

## 2024-11-23 LAB — COMPREHENSIVE METABOLIC PANEL WITH GFR
ALT: 9 IU/L (ref 0–32)
AST: 23 IU/L (ref 0–40)
Albumin: 4.5 g/dL (ref 3.7–4.7)
Alkaline Phosphatase: 129 IU/L (ref 48–129)
BUN/Creatinine Ratio: 17 (ref 12–28)
BUN: 15 mg/dL (ref 8–27)
Bilirubin Total: 0.3 mg/dL (ref 0.0–1.2)
CO2: 24 mmol/L (ref 20–29)
Calcium: 9.6 mg/dL (ref 8.7–10.3)
Chloride: 100 mmol/L (ref 96–106)
Creatinine, Ser: 0.87 mg/dL (ref 0.57–1.00)
Globulin, Total: 2.4 g/dL (ref 1.5–4.5)
Glucose: 79 mg/dL (ref 70–99)
Potassium: 4.7 mmol/L (ref 3.5–5.2)
Sodium: 140 mmol/L (ref 134–144)
Total Protein: 6.9 g/dL (ref 6.0–8.5)
eGFR: 65 mL/min/1.73

## 2024-11-23 LAB — TSH: TSH: 3.7 u[IU]/mL (ref 0.450–4.500)

## 2024-11-27 ENCOUNTER — Other Ambulatory Visit: Payer: Self-pay | Admitting: Family Medicine

## 2024-11-27 DIAGNOSIS — K219 Gastro-esophageal reflux disease without esophagitis: Secondary | ICD-10-CM

## 2024-11-28 NOTE — Telephone Encounter (Signed)
 Requested Prescriptions  Pending Prescriptions Disp Refills   pantoprazole  (PROTONIX ) 40 MG tablet [Pharmacy Med Name: PANTOPRAZOLE  40MG  TABLETS] 90 tablet 2    Sig: TAKE 1 TABLET(40 MG) BY MOUTH DAILY     Gastroenterology: Proton Pump Inhibitors Passed - 11/28/2024 10:32 AM      Passed - Valid encounter within last 12 months    Recent Outpatient Visits           6 days ago OSA on CPAP   [facility], Vinay K, MD   6 months ago OSA on CPAP   [facility] at [facility], Vinay K, MD

## 2024-12-04 LAB — PULMONARY FUNCTION TEST

## 2025-05-02 ENCOUNTER — Ambulatory Visit

## 2025-05-23 ENCOUNTER — Ambulatory Visit: Admitting: Family Medicine

## 2025-07-31 ENCOUNTER — Ambulatory Visit: Admitting: Internal Medicine
